# Patient Record
Sex: Male | Born: 1935 | Race: White | Hispanic: No | Smoking: Former smoker
Health system: Southern US, Community
[De-identification: ages and names within clinical notes are randomized; demographics above are authoritative.]

## PROBLEM LIST (undated history)

## (undated) DIAGNOSIS — M75110 Incomplete rotator cuff tear or rupture of unspecified shoulder, not specified as traumatic: Secondary | ICD-10-CM

## (undated) DIAGNOSIS — Z972 Presence of dental prosthetic device (complete) (partial): Secondary | ICD-10-CM

## (undated) DIAGNOSIS — I639 Cerebral infarction, unspecified: Secondary | ICD-10-CM

## (undated) DIAGNOSIS — E785 Hyperlipidemia, unspecified: Secondary | ICD-10-CM

## (undated) DIAGNOSIS — I272 Pulmonary hypertension, unspecified: Secondary | ICD-10-CM

## (undated) DIAGNOSIS — I10 Essential (primary) hypertension: Secondary | ICD-10-CM

## (undated) DIAGNOSIS — D649 Anemia, unspecified: Secondary | ICD-10-CM

## (undated) DIAGNOSIS — I351 Nonrheumatic aortic (valve) insufficiency: Secondary | ICD-10-CM

## (undated) DIAGNOSIS — I739 Peripheral vascular disease, unspecified: Secondary | ICD-10-CM

## (undated) DIAGNOSIS — I2510 Atherosclerotic heart disease of native coronary artery without angina pectoris: Secondary | ICD-10-CM

## (undated) DIAGNOSIS — T8182XA Emphysema (subcutaneous) resulting from a procedure, initial encounter: Secondary | ICD-10-CM

## (undated) DIAGNOSIS — I4819 Other persistent atrial fibrillation: Secondary | ICD-10-CM

## (undated) DIAGNOSIS — I5042 Chronic combined systolic (congestive) and diastolic (congestive) heart failure: Secondary | ICD-10-CM

## (undated) DIAGNOSIS — E119 Type 2 diabetes mellitus without complications: Secondary | ICD-10-CM

## (undated) DIAGNOSIS — I340 Nonrheumatic mitral (valve) insufficiency: Secondary | ICD-10-CM

## (undated) DIAGNOSIS — I6529 Occlusion and stenosis of unspecified carotid artery: Secondary | ICD-10-CM

## (undated) HISTORY — DX: Nonrheumatic mitral (valve) insufficiency: I34.0

## (undated) HISTORY — PX: CORONARY ANGIOPLASTY WITH STENT PLACEMENT: SHX49

## (undated) HISTORY — DX: Emphysema (subcutaneous) resulting from a procedure, initial encounter: T81.82XA

## (undated) HISTORY — DX: Essential (primary) hypertension: I10

## (undated) HISTORY — DX: Atherosclerotic heart disease of native coronary artery without angina pectoris: I25.10

## (undated) HISTORY — PX: KNEE ARTHROSCOPY: SHX127

## (undated) HISTORY — DX: Anemia, unspecified: D64.9

## (undated) HISTORY — DX: Other persistent atrial fibrillation: I48.19

## (undated) HISTORY — PX: MULTIPLE TOOTH EXTRACTIONS: SHX2053

## (undated) HISTORY — PX: FRACTURE SURGERY: SHX138

## (undated) HISTORY — DX: Hyperlipidemia, unspecified: E78.5

## (undated) HISTORY — DX: Nonrheumatic aortic (valve) insufficiency: I35.1

## (undated) HISTORY — DX: Chronic combined systolic (congestive) and diastolic (congestive) heart failure: I50.42

---

## 1982-12-13 HISTORY — PX: HERNIA REPAIR: SHX51

## 1993-10-22 HISTORY — PX: CARDIAC CATHETERIZATION: SHX172

## 1998-08-07 ENCOUNTER — Ambulatory Visit (HOSPITAL_COMMUNITY): Admission: RE | Admit: 1998-08-07 | Discharge: 1998-08-07 | Payer: Self-pay

## 1998-08-19 ENCOUNTER — Ambulatory Visit (HOSPITAL_COMMUNITY): Admission: RE | Admit: 1998-08-19 | Discharge: 1998-08-19 | Payer: Self-pay

## 1999-02-03 ENCOUNTER — Ambulatory Visit (HOSPITAL_COMMUNITY): Admission: RE | Admit: 1999-02-03 | Discharge: 1999-02-03 | Payer: Self-pay | Admitting: Internal Medicine

## 1999-02-03 ENCOUNTER — Encounter: Payer: Self-pay | Admitting: Internal Medicine

## 2000-04-27 ENCOUNTER — Encounter: Payer: Self-pay | Admitting: Internal Medicine

## 2000-04-27 ENCOUNTER — Ambulatory Visit (HOSPITAL_COMMUNITY): Admission: RE | Admit: 2000-04-27 | Discharge: 2000-04-27 | Payer: Self-pay | Admitting: Internal Medicine

## 2000-12-27 ENCOUNTER — Encounter (HOSPITAL_COMMUNITY): Admission: RE | Admit: 2000-12-27 | Discharge: 2001-03-27 | Payer: Self-pay | Admitting: Cardiovascular Disease

## 2004-01-16 ENCOUNTER — Observation Stay (HOSPITAL_COMMUNITY): Admission: EM | Admit: 2004-01-16 | Discharge: 2004-01-17 | Payer: Self-pay | Admitting: Emergency Medicine

## 2008-11-01 ENCOUNTER — Inpatient Hospital Stay (HOSPITAL_BASED_OUTPATIENT_CLINIC_OR_DEPARTMENT_OTHER): Admission: RE | Admit: 2008-11-01 | Discharge: 2008-11-01 | Payer: Self-pay | Admitting: Cardiovascular Disease

## 2008-11-05 ENCOUNTER — Inpatient Hospital Stay (HOSPITAL_COMMUNITY): Admission: RE | Admit: 2008-11-05 | Discharge: 2008-11-06 | Payer: Self-pay | Admitting: Internal Medicine

## 2008-11-05 HISTORY — PX: CARDIAC CATHETERIZATION: SHX172

## 2008-11-21 ENCOUNTER — Encounter (HOSPITAL_COMMUNITY): Admission: RE | Admit: 2008-11-21 | Discharge: 2008-12-11 | Payer: Self-pay | Admitting: Cardiovascular Disease

## 2008-12-13 ENCOUNTER — Encounter (HOSPITAL_COMMUNITY): Admission: RE | Admit: 2008-12-13 | Discharge: 2009-03-11 | Payer: Self-pay | Admitting: Cardiovascular Disease

## 2009-05-09 ENCOUNTER — Encounter
Admission: RE | Admit: 2009-05-09 | Discharge: 2009-05-09 | Payer: Self-pay | Admitting: Physical Medicine and Rehabilitation

## 2010-08-25 ENCOUNTER — Ambulatory Visit: Payer: Self-pay | Admitting: Cardiovascular Disease

## 2011-04-26 ENCOUNTER — Encounter: Payer: Self-pay | Admitting: *Deleted

## 2011-04-26 DIAGNOSIS — E785 Hyperlipidemia, unspecified: Secondary | ICD-10-CM | POA: Insufficient documentation

## 2011-04-26 DIAGNOSIS — I2510 Atherosclerotic heart disease of native coronary artery without angina pectoris: Secondary | ICD-10-CM | POA: Insufficient documentation

## 2011-04-26 DIAGNOSIS — I219 Acute myocardial infarction, unspecified: Secondary | ICD-10-CM | POA: Insufficient documentation

## 2011-04-26 DIAGNOSIS — I10 Essential (primary) hypertension: Secondary | ICD-10-CM | POA: Insufficient documentation

## 2011-04-27 NOTE — Discharge Summary (Signed)
NAME:  Todd Armstrong, Todd Armstrong NO.:  [id number]   MEDICAL RECORD NO.:  [id number]          PATIENT TYPE:  INP   LOCATION:  6529                         FACILITY:  [facility]   PHYSICIAN:  Vesta Mixer, M.D. DATE OF BIRTH:  10/26/1931   DATE OF ADMISSION:  11/05/2008  DATE OF DISCHARGE:  11/06/2008                               DISCHARGE SUMMARY   DISCHARGE DIAGNOSES:  1. Coronary artery disease - status post percutaneous transluminal      coronary angioplasty and stenting of his right coronary artery.  2. 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  3. Dyslipidemia.  4. Hypertension.   DISCHARGE MEDICATIONS:  1. Aspirin 325 mg a day.  2. Plavix 75 mg a day.  3. Crestor 10 mg a day.  4. Nitroglycerin 0.4 mg sublingually as needed.  5. Atenolol 50 mg a day.  6. Hydrochlorothiazide 25 mg a day.   The patient has been instructed to stop his Prilosec as much as  possible.   DISPOSITION:  The patient will see Dr. Elease Hashimoto in the office in 1-2  weeks.   HISTORY:  Mr. [name] is a 77 year old gentleman who recently started  having some episodes of chest discomfort.  He had a diagnostic heart  catheterization which revealed a tight stenosis in his mid right  coronary artery.  His left circumflex artery was occluded.  He underwent  successful PTCA and stenting of his mid right coronary artery.  We  placed a 3.5 x 16 mm Liberte stent.  It was post-dilated using a 4.5 mm  balloon.  We also placed a long 4.0 x 28 mm in the proximal LAD because  of an edge dissection.  This was also post-dilated using a 4.5 mm  balloon.  The final size was about 4.4 mm in size.  The patient  tolerated the procedure quite well.  We were able to obtain a very nice  result.  The patient was discharged on the above-noted medications and  disposition.  All of his other medical problems were stable.      Vesta Mixer, M.D.  Electronically Signed     PJN/MEDQ  D:  11/06/2008  T:   11/06/2008  Job:  [id number]   cc:   Thora Lance, M.D.

## 2011-04-27 NOTE — H&P (Signed)
NAME:  Todd Armstrong, Todd Armstrong NO.:  [id number]   MEDICAL RECORD NO.:  [id number]          PATIENT TYPE:  OUT   LOCATION:                               FACILITY:  [facility]   PHYSICIAN:  Vesta Mixer, M.D. DATE OF BIRTH:  04-09-1931   DATE OF ADMISSION:  10/28/2008  DATE OF DISCHARGE:                              HISTORY & PHYSICAL   Todd Armstrong is an elderly gentleman with a history of coronary artery  disease.  He is status post lateral wall myocardial infarction.  He is  admitted now for cardiac catheterization after presenting with some  chest pains and an abnormal stress Cardiolite study.   Todd Armstrong has a history of coronary artery disease.  He had a lateral  wall myocardial infarction in 2001.  He had an occlusion in his left  circumflex artery.  The angulation of the vessel was so severe that he  is not able to have a stent placed.  He has been treated medically and  is overall done fairly well.  He recently presented to Dr. Jone Baseman  office with some episodes of chest pain and indigestion and heartburn.  Stress Cardiolite study performed this morning revealed a large lateral  wall myocardial infarction with an inferolateral ischemia.  He also went  into atrial fibrillation during the stress test.   He is now admitted for further evaluation.  He denies any syncope or  presyncope.  He denies any PND or orthopnea.  He does have some  shortness of breath and some indigestion, which seems to have gotten a  little worse over the past several years.  He denies any fever, cough,  or sputum production.   CURRENT MEDICATIONS:  1. Tenormin 50 mg a day.  2. Aspirin 325 mg a day.  3. Lipitor 20 mg a day.  4. Hydrochlorothiazide 12.5 mg a day.  5. Prilosec once a day.   He has no known drug allergies.  He is intolerant to some statins.   PAST MEDICAL HISTORY:  1. Coronary artery disease.  2. Hypertension.  3. Hyperlipidemia.   SOCIAL HISTORY:  The patient quit  smoking in 1977.   FAMILY HISTORY:  Positive for cardiac disease.   REVIEW OF SYSTEMS:  He denies any cough or sputum production.  He denies  any rash or skin nodules.  He denies any pleuritic component to his  chest discomfort.  He does not get any regular exercise, but thinks that  his chest pain may be slightly worse with his normal activities.  All  other systems were reviewed and are negative.   PHYSICAL EXAMINATION:  GENERAL:  He is an elderly gentleman in no acute  distress.  He is alert and oriented x3 and his mood and affect are  normal.  VITAL SIGNS:  His weight is 184, blood pressure is 130/70 with heart  rate of 82.  NECK:  2+ carotids.  He has no bruits, no JVD, no thyromegaly.  There is  no lymphadenopathy.  His neck is supple.  LUNGS:  Clear.  HEART:  Regular rate.  S1 and  S2.  There is no chest wall deformity.  ABDOMEN:  Good bowel sounds.  He has no hepatosplenomegaly.  There is no  guarding or rebound.  His abdomen is nontender.  EXTREMITIES:  He has no  clubbing, cyanosis, or edema.  SKIN:  No rash.  He is warm and dry.  NEUROLOGIC:  Motor and sensory function intact.  Gait is normal.  His  mood and affect are normal.   Todd Armstrong presents with episode of chest pain and abnormal stress  Cardiolite study.  He also has developed atrial fibrillation.  We have  scheduled him to be admitted for heart catheterization.  We have  discussed risks, benefits, and options of heart catheterization.  He  understands and agrees to proceed.      Vesta Mixer, M.D.  Electronically Signed     PJN/MEDQ  D:  10/28/2008  T:  10/29/2008  Job:  [id number]   cc:   Thora Lance, M.D.

## 2011-04-27 NOTE — Cardiovascular Report (Signed)
NAME:  Todd Armstrong, Todd Armstrong NO.:  [id number]   MEDICAL RECORD NO.:  [id number]          PATIENT TYPE:  INP   LOCATION:  6529                         FACILITY:  [facility]   PHYSICIAN:  Vesta Mixer, M.D. DATE OF BIRTH:  [birth year]   DATE OF PROCEDURE:  11/05/2008  DATE OF DISCHARGE:                            CARDIAC CATHETERIZATION   Todd Armstrong is a 77 year old gentleman with a history of coronary  artery disease.  He recently had a heart catheterization which revealed  a tight stenosis in a very large and dominant right coronary artery.  He  was brought back today for PCI of that vessel.   The procedure was left heart catheterization with PCI of the right  coronary artery.   The right femoral artery was easily cannulated using a modified  Seldinger technique.   HEMODYNAMICS:  The aortic pressure is 114/50.   ANGIOGRAPHY:  The right coronary artery was engaged using a Judkins  right 4 side-hole guide.   Angiography revealed a large right coronary artery that was somewhat  ectatic.  There was a 40-50% proximal stenosis followed by a tight 80-  90% stenosis.  The distal vessel had minor luminal irregularities.   Angiomax was given.  The ACT was 316.   A Prowater angioplasty wire was threaded down to the distal right  coronary artery.   A 3.0 x 15-mm Apex Monorail was positioned across the stenosis and was  inflated up to 8 atmospheres for 27 seconds and then 14 atmospheres for  29 seconds.  Following this, a 3.5 x 16-mm Liberte stent was positioned  across the stenosis and inflated up to 12 atmospheres for 25 seconds.  Poststent dilatation was achieved using a 4.0 x 12 mm Voyager [place]  inflated up to 12 atmospheres for 16 seconds and then 16 atmospheres for  19 seconds.  This gave [place] fairly good result but was still incomplete  expansion of the middle of the stent.  We then took a 4.5 x 12-mm  Quantum Monorail and placed it at the tightest stenosis and  inflated up  to 6 atmospheres for 30 seconds followed by 10 atmospheres for 22  seconds.  This resulted in significant improvement of the vessel lesion  but it did resolve in an edge dissection in the proximal edge.  Since  there was 50% stenosis from this edge on back to the proximal aspect of  the right coronary artery, we decided to go and stent that whole  segment.  A 4.0 x 28-mm Liberte Monorail stent was positioned across  this entire segment and was deployed at 12 atmospheres for 25 seconds.  Poststent dilatation was achieved using a 4.5 x 12-mm Quantum Monorail.  Inflated distally up to 10 atmospheres for 16 seconds, 10 atmospheres  for 15 seconds, 8 atmospheres for 11 seconds, 6 atmospheres for 27  seconds and 10 atmospheres for 12  seconds as we pulled the balloon from distal to proximal.  This gave [place]  a very nice angiographic result with no evidence of edge dissection.  There was good apposition.  There was a 0% residual.  The patient tolerated the procedure quite well.  He should go home  tomorrow.  No complications.      Vesta Mixer, M.D.  Electronically Signed     PJN/MEDQ  D:  11/05/2008  T:  11/06/2008  Job:  [id number]   cc:   Thora Lance, M.D.

## 2011-04-27 NOTE — Cardiovascular Report (Signed)
NAME:  Todd Armstrong, HENNER NO.:  [id number]   MEDICAL RECORD NO.:  [id number]          PATIENT TYPE:  OIB   LOCATION:  1962                         FACILITY:  [facility]   PHYSICIAN:  Vesta Mixer, M.D. DATE OF BIRTH:  07/31/1931   DATE OF PROCEDURE:  DATE OF DISCHARGE:  11/01/2008                            CARDIAC CATHETERIZATION   Odie Edmonds is a 77 year old gentleman with a history of coronary  artery disease.  He has a known occlusion of the circumflex artery.  He  recently presented with episodes of angina.  A stress Cardiolite study  had revealed a lateral infarct, as well as some inferior ischemia.  He  is referred now for heart catheterization.   The procedure was left heart catheterization with coronary angiography.   The right femoral artery was easily cannulated using modified Seldinger  technique.   HEMODYNAMICS:  LV pressure is 108/11 with an aortic pressure of 109/56.   Angiography:  Left main:  The left main has minor luminal irregularities.   The left anterior descending artery has mild to moderate irregularities  between 10-20%.  The ramus intermediate vessel is a small vessel and has  only minor luminal irregularities.   The left circumflex artery is occluded proximally.  The distal marginal  vessel fills late via left to left collaterals.   The right coronary artery is large and is dominant.  There is a proximal  irregular 40% stenosis, followed by a mid 80-90% stenosis.  This  stenosis is quite eccentric.   The posterior descending artery and the posterolateral segment artery  are normal.  The left ventriculogram reveals the left ventricular  systolic function that is at lower normal with an ejection fraction  around 50%.  There appears to be some lateral akinesis.  The inferior  wall contracts fairly normally.   COMPLICATIONS:  None.   CONCLUSION:  Two-vessel coronary artery disease:  He has a known chronic  total occlusion of  the left circumflex artery.  He has a new tight  stenosis of the right coronary artery.  We will schedule him for PCI of  the right coronary artery.  We will go and start loading him on Plavix.     Vesta Mixer, M.D.  Electronically Signed    PJN/MEDQ  D:  11/01/2008  T:  11/01/2008  Job:  [id number]   cc:   Thora Lance, M.D.

## 2011-04-28 ENCOUNTER — Ambulatory Visit (INDEPENDENT_AMBULATORY_CARE_PROVIDER_SITE_OTHER): Payer: Medicare Other | Admitting: Cardiovascular Disease

## 2011-04-28 ENCOUNTER — Encounter: Payer: Self-pay | Admitting: Cardiovascular Disease

## 2011-04-28 DIAGNOSIS — I2510 Atherosclerotic heart disease of native coronary artery without angina pectoris: Secondary | ICD-10-CM

## 2011-04-28 DIAGNOSIS — E785 Hyperlipidemia, unspecified: Secondary | ICD-10-CM

## 2011-04-28 NOTE — Progress Notes (Signed)
Todd Armstrong Date of Birth  1931-01-17 [facility] / [facility] [address]     [address] [place], [place]  [address] [phone]  Fax  [phone]  History of Present Illness:  Todd Armstrong is doing well from a cardiac standpoint. He has not had any episodes of chest pain or shortness breath. [name] been exercising on a regular basis.  Current Outpatient Prescriptions on File Prior to Visit  Medication Sig Dispense Refill  . aspirin 325 MG tablet Take 325 mg by mouth daily.        . hydrochlorothiazide (,MICROZIDE/HYDRODIURIL,) 12.5 MG capsule Take 12.5 mg by mouth daily.        . metFORMIN (GLUCOPHAGE) 1000 MG tablet Take 1,000 mg by mouth 2 (two) times daily with a meal.        . nitroGLYCERIN (NITROSTAT) 0.4 MG SL tablet Place 0.4 mg under the tongue every 5 (five) minutes as needed.        . rosuvastatin (CRESTOR) 10 MG tablet Take 10 mg by mouth daily.          No Known Allergies  Past Medical History  Diagnosis Date  . Acute myocardial infarction   . Dyslipidemia   . Hyperlipemia   . Hypertension   . Coronary artery disease     PTCA and stenting of his right coronary artery. 3.5 x 16-mm Liberte stent.                Past Surgical History  Procedure Date  . Hernia repair 1984  . Cardiac catheterization 11/05/2008  . Cardiac catheterization 10/22/1993    EF 60%    History  Smoking status  . Former Smoker  Smokeless tobacco  . Not on file    History  Alcohol Use No    Family History  Problem Relation Age of Onset  . Heart attack Father     Reviw of Systems:  Reviewed in the HPI.  All other systems are negative.  Physical Exam: BP 132/72  Pulse 58  Ht 5\' 10"  (1.778 m)  Wt 176 lb (79.833 kg)  BMI 25.25 kg/m2 The patient is alert and oriented x 3.  The mood and affect are normal.  The skin is warm and dry.  Color is normal.  The HEENT exam reveals that the sclera are nonicteric.  The mucous membranes are moist.  The  carotids are 2+ without bruits.  There is no thyromegaly.  There is no JVD.  The lungs are clear.  The chest wall is non tender.  The heart exam reveals a regular rate with a normal S1 and S2.  There are no murmurs, gallops, or rubs.  The PMI is not displaced.   Abdominal exam reveals good bowel sounds.  There is no guarding or rebound.  There is no hepatosplenomegaly or tenderness.  There are no masses.  Exam of the legs reveal no clubbing, cyanosis, or edema.  The legs are without rashes.  The distal pulses are intact.  Cranial nerves II - XII are intact.  Motor and sensory functions are intact.  The gait is normal.  Assessment / Plan:

## 2011-04-28 NOTE — Assessment & Plan Note (Signed)
Todd Armstrong is very stable. He has not had any angina. I would like to continue with the same medications.

## 2011-04-30 NOTE — H&P (Signed)
NAME:  Todd Armstrong, Todd Armstrong NO.:  [id number]   MEDICAL RECORD NO.:  [id number]                   PATIENT TYPE:  INP   LOCATION:  1826                                 FACILITY:  [facility]   PHYSICIAN:  Lianne Cure, P.A.               DATE OF BIRTH:  1931-06-10   DATE OF ADMISSION:  01/16/2004  DATE OF DISCHARGE:                                HISTORY & PHYSICAL   HISTORY OF PRESENT ILLNESS:  This 77 year old male fell outside his home,  reportedly slipped on ice.  Had immediate right lower leg pain in the shin  area.  He reports no numbness or tingling.  No other musculoskeletal  complaints.   PAST MEDICAL HISTORY:  1. Hypertension.  2. Hypercholesterolemia.  3. History of an MI approximately two years ago.   PAST SURGICAL HISTORY:  Left knee debridement.   No known drug allergies.   MEDICATIONS:  1. Atenolol 5 mg p.o. q.d.  2. Lipitor 10 mg p.o. q.d.  3. Enteric-coated aspirin 325 mg daily.   SOCIAL HISTORY:  He is a nonsmoker.  He does occasionally drink beer once in  a while.   REVIEW OF SYSTEMS:  No migraines or seizures.  No shortness of breath.  No  angina.  No difficulty swallowing.  No chronic cough.  No hemoptysis.  No  bowel or bladder dysfunction.  No melena.   PHYSICAL EXAMINATION:  EXTREMITIES:  Right lower extremity does have  positive edema.  Positive ecchymosis.  His neurovascular and motor are  intact and equal bilaterally and distally.  Palpable DP/PT pulses are equal  bilaterally.  Sensation to light touch is intact and equal bilaterally.  Range of motion of the ankles and toes is equal bilaterally.  He is  nontender to palpation in bilateral femurs, hips.  Bilateral upper  extremities are nontender to palpation at the joints.  He is neurovascularly  motor intact and equal bilaterally in the upper extremities.   X-rays reveal a distal one-third tibial fracture, moderately displaced with  shortening on oblique fracture pattern.   Proximal fibular head is also  fractured.  Minimally displaced.   IMPRESSION:  Right distal tibial fracture.   PLAN:  We are going to take him to the OR this evening at approximately 7  p.m. by Dr. Leonides Grills, who will insert an intramedullary nail.  He is  going to remain n.p.o.  We gave him morphine __________ q.3h. for pain  control.  The last time he ate was at approximately 12:00 today.                                                Lianne Cure, P.A.    MC/MEDQ  D:  01/16/2004  T:  01/16/2004  Job:  [id number]

## 2011-04-30 NOTE — Op Note (Signed)
NAME:  Todd Armstrong, Todd Armstrong NO.:  [id number]   MEDICAL RECORD NO.:  [id number]                   PATIENT TYPE:  INP   LOCATION:  1826                                 FACILITY:  [facility]   PHYSICIAN:  Leonides Grills, M.D.                  DATE OF BIRTH:  05-Aug-1931   DATE OF PROCEDURE:  01/16/2004  DATE OF DISCHARGE:                                 OPERATIVE REPORT   PREOPERATIVE DIAGNOSIS:  Right closed tib/fib fracture.   POSTOPERATIVE DIAGNOSIS:  Right closed tib/fib fracture.   OPERATION:  1. Intramedullary nailing right closed tib/fib fracture.  2. Stress x-rays right leg.   ANESTHESIA:  General endotracheal anesthesia.   SURGEON:  Leonides Grills, M.D.   ASSISTANT:  Lianne Cure, P.A.-C.   ESTIMATED BLOOD LOSS:  Minimal.   COMPLICATIONS:  None.   DISPOSITION:  Stable to the PACU.   INDICATIONS FOR PROCEDURE:  This is a 77 year old male who slipped and fell  today on the ice and sustained the above injury.  He was taken to [facility]  ER where x-rays were obtained and I was called for further evaluation and  treatment.  He was consented for the above procedure.  All risks which  include infection, neurovascular injury, malunion, nonunion, hardware  failure, compartment syndrome, stiffness, hardware rotation, were all  explained, questions were encouraged and answered.   OPERATION:  The patient was brought to the operating room and placed in  supine position.  After adequate general endotracheal tube anesthesia was  administered as well as Ancef 1 gram IV piggyback, the right lower extremity  was then prepped and draped in a sterile manner.  A longitudinal incision  just medial to the patellar tendon was then made.  Dissection was carried  down through the skin.  The retinaculum was incised in line with the  incision just medial to the patellar tendon.  The anterior crest of the  tibia was then identified.  A K-wire was then applied in the AP and  lateral  planes and was noted to be in proper position.  This was then fired and the  outer cortex was drilled.  An awl was placed followed by a guide-wire.  This  was all done under C-arm guidance in the AP and lateral planes.  The guide-  wire was advanced to the fracture site, the fracture site was anatomically  reduced.  The guide-wire was passed across the fracture site and advanced to  the growth plate.  This was verified in AP and lateral planes under C-arm  guidance.  We then commenced reaming starting with a #9 reamer and advanced  all the way to a #12.  We then chose an 11 mm wide 160 mm long Synthes  titanium nail, the length was also measured by using the same guide-wire.  The nail was then advanced.  This was then proximally locked with an  outrigger  through stab wounds with the nick and spread technique.  Both  screws had excellent purchase.  Distally, we then compressed the fracture  site and then with free arm technique, we placed two distally locked screws  through medial incisions with the nick and spread technique.  Both screws  were in excellent alignment and excellent purchase.  Final x-rays were  obtained in the AP and lateral planes and showed anatomic reduction with the  proper placement of the nails.  The wounds were copiously irrigated with  normal saline.  The retinaculum was closed with 2-0 Vicryl, the subcu was  closed with 2-0 Vicryl, the skin was closed over all wounds with staples.  A  sterile dressing was applied, a modified Jones dressing was applied with the  ankle in neutral dorsiflexion.  The patient was stable to the recovery room.                                               Leonides Grills, M.D.    PB/MEDQ  D:  01/16/2004  T:  01/17/2004  Job:  [id number]

## 2011-05-04 ENCOUNTER — Telehealth: Payer: Self-pay | Admitting: Cardiovascular Disease

## 2011-05-04 NOTE — Telephone Encounter (Signed)
SAMPLES OF CRESTOR.

## 2011-05-26 ENCOUNTER — Telehealth: Payer: Self-pay | Admitting: Cardiovascular Disease

## 2011-05-26 NOTE — Telephone Encounter (Signed)
PT ASKING FOR CRESTOR SAMPLES, CALL AND LET HIM KNOW.

## 2011-05-26 NOTE — Telephone Encounter (Signed)
We have samples, left msg to pick up, we dont have coumadin samples.

## 2011-07-05 ENCOUNTER — Telehealth: Payer: Self-pay | Admitting: Cardiovascular Disease

## 2011-07-05 NOTE — Telephone Encounter (Signed)
Would like to pick up some 10 mg samples of Crestor as soon as possible. Please call back

## 2011-08-28 ENCOUNTER — Other Ambulatory Visit: Payer: Self-pay | Admitting: Cardiovascular Disease

## 2011-09-14 LAB — BASIC METABOLIC PANEL
CO2: 25
Calcium: 8.6
GFR calc Af Amer: >60
GFR calc non Af Amer: >60
Glucose, Bld: 132 — ABNORMAL HIGH
Potassium: 3.8
Sodium: 135

## 2011-09-14 LAB — CBC
HCT: 44.5
Hemoglobin: 15.1
MCHC: 34
RBC: 4.67
RDW: 12.9

## 2011-09-17 LAB — GLUCOSE, CAPILLARY
Glucose-Capillary: 115 mg/dL — ABNORMAL HIGH (ref 70–99)
Glucose-Capillary: 173 mg/dL — ABNORMAL HIGH (ref 70–99)
Glucose-Capillary: 174 mg/dL — ABNORMAL HIGH (ref 70–99)
Glucose-Capillary: 174 mg/dL — ABNORMAL HIGH (ref 70–99)

## 2011-10-26 ENCOUNTER — Telehealth: Payer: Self-pay | Admitting: *Deleted

## 2011-10-26 NOTE — Telephone Encounter (Signed)
Called msg we have crestor samples available one month dispensed. [id number] 06/15

## 2011-11-16 ENCOUNTER — Telehealth: Payer: Self-pay | Admitting: Cardiovascular Disease

## 2011-11-16 NOTE — Telephone Encounter (Signed)
Pt calling  requesting samples of crestor 10 mg

## 2011-11-16 NOTE — Telephone Encounter (Signed)
Pt called and informed him that samples were available

## 2011-12-08 ENCOUNTER — Telehealth: Payer: Self-pay | Admitting: Cardiovascular Disease

## 2011-12-08 NOTE — Telephone Encounter (Signed)
New Msg: Pt calling wanting to know if pt can get 10 mg samples of crestor. Please return pt call to discusd further.

## 2011-12-08 NOTE — Telephone Encounter (Signed)
Samples provided and pt informed

## 2012-02-14 ENCOUNTER — Other Ambulatory Visit: Payer: Self-pay | Admitting: Cardiovascular Disease

## 2012-02-15 ENCOUNTER — Other Ambulatory Visit: Payer: Self-pay

## 2012-02-15 MED ORDER — ROSUVASTATIN CALCIUM 10 MG PO TABS: 10.0000 mg | ORAL_TABLET | Freq: Every day | ORAL | Status: DC

## 2012-02-29 DIAGNOSIS — L570 Actinic keratosis: Secondary | ICD-10-CM | POA: Diagnosis not present

## 2012-02-29 DIAGNOSIS — C44621 Squamous cell carcinoma of skin of unspecified upper limb, including shoulder: Secondary | ICD-10-CM | POA: Diagnosis not present

## 2012-02-29 DIAGNOSIS — L821 Other seborrheic keratosis: Secondary | ICD-10-CM | POA: Diagnosis not present

## 2012-03-07 DIAGNOSIS — E1149 Type 2 diabetes mellitus with other diabetic neurological complication: Secondary | ICD-10-CM | POA: Diagnosis not present

## 2012-03-07 DIAGNOSIS — L608 Other nail disorders: Secondary | ICD-10-CM | POA: Diagnosis not present

## 2012-03-13 DIAGNOSIS — E119 Type 2 diabetes mellitus without complications: Secondary | ICD-10-CM | POA: Diagnosis not present

## 2012-03-13 DIAGNOSIS — Z0000 Encounter for general adult medical examination without abnormal findings: Secondary | ICD-10-CM | POA: Diagnosis not present

## 2012-03-13 DIAGNOSIS — E785 Hyperlipidemia, unspecified: Secondary | ICD-10-CM | POA: Diagnosis not present

## 2012-03-13 DIAGNOSIS — I10 Essential (primary) hypertension: Secondary | ICD-10-CM | POA: Diagnosis not present

## 2012-03-13 DIAGNOSIS — Z1331 Encounter for screening for depression: Secondary | ICD-10-CM | POA: Diagnosis not present

## 2012-06-01 DIAGNOSIS — D239 Other benign neoplasm of skin, unspecified: Secondary | ICD-10-CM | POA: Diagnosis not present

## 2012-06-01 DIAGNOSIS — Z85828 Personal history of other malignant neoplasm of skin: Secondary | ICD-10-CM | POA: Diagnosis not present

## 2012-06-01 DIAGNOSIS — L570 Actinic keratosis: Secondary | ICD-10-CM | POA: Diagnosis not present

## 2012-06-01 DIAGNOSIS — L821 Other seborrheic keratosis: Secondary | ICD-10-CM | POA: Diagnosis not present

## 2012-06-06 ENCOUNTER — Telehealth: Payer: Self-pay | Admitting: Cardiovascular Disease

## 2012-06-06 NOTE — Telephone Encounter (Signed)
Please return call to patient at [phone] regarding medical care.

## 2012-06-06 NOTE — Telephone Encounter (Signed)
Pt needed samples of crestor for him and wife, carolyn, provided

## 2012-06-20 ENCOUNTER — Other Ambulatory Visit: Payer: Self-pay | Admitting: *Deleted

## 2012-06-20 MED ORDER — ATENOLOL 50 MG PO TABS: 50.0000 mg | ORAL_TABLET | Freq: Every day | ORAL | Status: DC

## 2012-06-20 NOTE — Telephone Encounter (Signed)
Pt needs appointment then refill can be made Fax Received. Refill Completed. Keshun Berrett Chowoe (R.M.A)   

## 2012-06-23 DIAGNOSIS — L608 Other nail disorders: Secondary | ICD-10-CM | POA: Diagnosis not present

## 2012-06-27 DIAGNOSIS — Z125 Encounter for screening for malignant neoplasm of prostate: Secondary | ICD-10-CM | POA: Diagnosis not present

## 2012-06-27 DIAGNOSIS — R351 Nocturia: Secondary | ICD-10-CM | POA: Diagnosis not present

## 2012-07-12 ENCOUNTER — Other Ambulatory Visit: Payer: Self-pay | Admitting: Cardiovascular Disease

## 2012-07-12 NOTE — Telephone Encounter (Signed)
Pt needs appointment then refill can be made Fax Received. Refill Completed. Lilliane Sposito Chowoe (R.M.A)   

## 2012-07-14 DIAGNOSIS — E119 Type 2 diabetes mellitus without complications: Secondary | ICD-10-CM | POA: Diagnosis not present

## 2012-07-14 DIAGNOSIS — I10 Essential (primary) hypertension: Secondary | ICD-10-CM | POA: Diagnosis not present

## 2012-08-17 ENCOUNTER — Other Ambulatory Visit: Payer: Self-pay | Admitting: *Deleted

## 2012-08-17 ENCOUNTER — Other Ambulatory Visit: Payer: Self-pay

## 2012-08-17 MED ORDER — ATENOLOL 50 MG PO TABS: 50.0000 mg | ORAL_TABLET | Freq: Every day | ORAL | Status: DC

## 2012-08-17 NOTE — Telephone Encounter (Signed)
Fax Received. Refill Completed. Todd Armstrong (R.M.A)   

## 2012-10-17 ENCOUNTER — Ambulatory Visit: Payer: PRIVATE HEALTH INSURANCE | Admitting: Cardiovascular Disease

## 2012-10-17 DIAGNOSIS — Z23 Encounter for immunization: Secondary | ICD-10-CM | POA: Diagnosis not present

## 2012-10-19 ENCOUNTER — Ambulatory Visit (INDEPENDENT_AMBULATORY_CARE_PROVIDER_SITE_OTHER): Payer: Medicare Other | Admitting: Cardiovascular Disease

## 2012-10-19 ENCOUNTER — Encounter: Payer: Self-pay | Admitting: Cardiovascular Disease

## 2012-10-19 VITALS — BP 105/60 | HR 54 | Ht 70.0 in | Wt 168.6 lb

## 2012-10-19 DIAGNOSIS — I2510 Atherosclerotic heart disease of native coronary artery without angina pectoris: Secondary | ICD-10-CM | POA: Diagnosis not present

## 2012-10-19 DIAGNOSIS — E785 Hyperlipidemia, unspecified: Secondary | ICD-10-CM | POA: Diagnosis not present

## 2012-10-19 NOTE — Assessment & Plan Note (Addendum)
Desman has not had any angina.  Continue current meds. Will see him in 6 months with ov and fasting labs.

## 2012-10-19 NOTE — Progress Notes (Signed)
Todd Armstrong Date of Birth  12-26-1930 [facility] [address]     [address] [place], [place]  [address] [phone]  Fax  [phone]   Problems: 1. CAD, s/p LAD stent 2009 2. HTN 3. Diabetes Mellitus 4. Hyperlipidemia   History of Present Illness:  Todd Armstrong is doing well from a cardiac standpoint. He has not had any episodes of chest pain or shortness breath. [name] been exercising on a regular basis.  He had a nice garden this year.    Current Outpatient Prescriptions on File Prior to Visit  Medication Sig Dispense Refill  . aspirin 325 MG tablet Take 325 mg by mouth daily.        [name] atenolol (TENORMIN) 50 MG tablet Take 1 tablet (50 mg total) by mouth daily.  90 tablet  0  . hydrochlorothiazide (,MICROZIDE/HYDRODIURIL,) 12.5 MG capsule Take 12.5 mg by mouth daily.        . metFORMIN (GLUCOPHAGE) 1000 MG tablet Take 1,000 mg by mouth 2 (two) times daily with a meal.        . nitroGLYCERIN (NITROSTAT) 0.4 MG SL tablet Place 0.4 mg under the tongue every 5 (five) minutes as needed.        . rosuvastatin (CRESTOR) 10 MG tablet Take 1 tablet (10 mg total) by mouth daily.  30 tablet  2    No Known Allergies  Past Medical History  Diagnosis Date  . Acute myocardial infarction   . Dyslipidemia   . Hyperlipemia   . Hypertension   . Coronary artery disease     PTCA and stenting of his right coronary artery. 3.5 x 16-mm Liberte stent.                Past Surgical History  Procedure Date  . Hernia repair 1984  . Cardiac catheterization 11/05/2008  . Cardiac catheterization 10/22/1993    EF 60%    History  Smoking status  . Former Smoker  Smokeless tobacco  . Not on file    History  Alcohol Use No    Family History  Problem Relation Age of Onset  . Heart attack Father     Reviw of Systems:  Reviewed in the HPI.  All other systems are negative.  Physical Exam: BP 178/72  Pulse 54  Ht 5\' 10"  (1.778 m)  Wt 168  lb 9.6 oz (76.476 kg)  BMI 24.19 kg/m2 The patient is alert and oriented x 3.  The mood and affect are normal.  The skin is warm and dry.  Color is normal.  The HEENT exam reveals that the sclera are nonicteric.  The mucous membranes are moist.  The carotids are 2+ without bruits.  There is no thyromegaly.  There is no JVD.  The lungs are clear.  The chest wall is non tender.  The heart exam reveals a regular rate with a normal S1 and S2.  There are no murmurs, gallops, or rubs.  The PMI is not displaced.   Abdominal exam reveals good bowel sounds.  There is no guarding or rebound.  There is no hepatosplenomegaly or tenderness.  There are no masses.  Exam of the legs reveal no clubbing, cyanosis, or edema.  The legs are without rashes.  The distal pulses are intact.  Cranial nerves II - XII are intact.  Motor and sensory functions are intact.  The gait is normal.  ECG:  Sinus brady at 58, septal MI.  Assessment /  Plan:

## 2012-10-19 NOTE — Patient Instructions (Addendum)
Your physician wants you to follow-up in: 6 months You will receive a reminder letter in the mail two months in advance. If you don't receive a letter, please call our office to schedule the follow-up appointment.  Your physician recommends that you return for a FASTING lipid profile: 6 months   Your physician recommends that you continue on your current medications as directed. Please refer to the Current Medication list given to you today.  

## 2012-10-27 ENCOUNTER — Encounter: Payer: Self-pay | Admitting: Cardiovascular Disease

## 2012-10-30 ENCOUNTER — Encounter: Payer: Self-pay | Admitting: Cardiovascular Disease

## 2012-11-08 ENCOUNTER — Other Ambulatory Visit: Payer: Self-pay | Admitting: Cardiovascular Disease

## 2012-11-08 MED ORDER — ATENOLOL 50 MG PO TABS: 50.0000 mg | ORAL_TABLET | Freq: Every day | ORAL | Status: DC

## 2012-12-01 DIAGNOSIS — L819 Disorder of pigmentation, unspecified: Secondary | ICD-10-CM | POA: Diagnosis not present

## 2012-12-01 DIAGNOSIS — L570 Actinic keratosis: Secondary | ICD-10-CM | POA: Diagnosis not present

## 2012-12-01 DIAGNOSIS — L821 Other seborrheic keratosis: Secondary | ICD-10-CM | POA: Diagnosis not present

## 2012-12-01 DIAGNOSIS — L738 Other specified follicular disorders: Secondary | ICD-10-CM | POA: Diagnosis not present

## 2012-12-01 DIAGNOSIS — Z85828 Personal history of other malignant neoplasm of skin: Secondary | ICD-10-CM | POA: Diagnosis not present

## 2013-01-30 DIAGNOSIS — E119 Type 2 diabetes mellitus without complications: Secondary | ICD-10-CM | POA: Diagnosis not present

## 2013-01-30 DIAGNOSIS — I10 Essential (primary) hypertension: Secondary | ICD-10-CM | POA: Diagnosis not present

## 2013-01-30 DIAGNOSIS — E785 Hyperlipidemia, unspecified: Secondary | ICD-10-CM | POA: Diagnosis not present

## 2013-04-26 ENCOUNTER — Encounter: Payer: Self-pay | Admitting: Cardiovascular Disease

## 2013-04-26 ENCOUNTER — Ambulatory Visit (INDEPENDENT_AMBULATORY_CARE_PROVIDER_SITE_OTHER): Payer: Medicare Other | Admitting: Cardiovascular Disease

## 2013-04-26 VITALS — BP 114/64 | HR 60 | Ht 70.0 in | Wt 172.4 lb

## 2013-04-26 DIAGNOSIS — I2510 Atherosclerotic heart disease of native coronary artery without angina pectoris: Secondary | ICD-10-CM

## 2013-04-26 NOTE — Patient Instructions (Addendum)
Your physician wants you to follow-up in: 6 months  You will receive a reminder letter in the mail two months in advance. If you don't receive a letter, please call our office to schedule the follow-up appointment.   Your physician recommends that you return for a FASTING lipid profile: 6 mon

## 2013-04-26 NOTE — Assessment & Plan Note (Signed)
Todd Armstrong is doing well. [name] not having any episodes of chest pain or shortness breath. I seen again in 6 months. We'll check fasting lipid profile, liver enzymes, and basic metabolic profile at that office visit.

## 2013-04-26 NOTE — Progress Notes (Signed)
Todd Armstrong Date of Birth  1931-06-17 [facility] [address]     [address] [place], [place]  [address] [phone]  Fax  [phone]   Problems: 1. CAD, s/p LAD stent 2009 2. HTN 3. Diabetes Mellitus 4. Hyperlipidemia   History of Present Illness:  Todd Armstrong is doing well from a cardiac standpoint. He has not had any episodes of chest pain or shortness breath. [name] been exercising on a regular basis.  He had a nice garden this year.    Apr 26, 2013:  Todd Armstrong is doing well.  No CP , no dyspnea.   He has put in his garden this year.    Current Outpatient Prescriptions on File Prior to Visit  Medication Sig Dispense Refill  . aspirin 325 MG tablet Take 325 mg by mouth daily.        [name] atenolol (TENORMIN) 50 MG tablet Take 1 tablet (50 mg total) by mouth daily.  90 tablet  1  . hydrochlorothiazide (,MICROZIDE/HYDRODIURIL,) 12.5 MG capsule Take 12.5 mg by mouth daily.        . metFORMIN (GLUCOPHAGE) 1000 MG tablet Take 1,000 mg by mouth 2 (two) times daily with a meal.        . nitroGLYCERIN (NITROSTAT) 0.4 MG SL tablet Place 0.4 mg under the tongue every 5 (five) minutes as needed.        . rosuvastatin (CRESTOR) 10 MG tablet Take 1 tablet (10 mg total) by mouth daily.  30 tablet  2   No current facility-administered medications on file prior to visit.    No Known Allergies  Past Medical History  Diagnosis Date  . Acute myocardial infarction   . Dyslipidemia   . Hyperlipemia   . Hypertension   . Coronary artery disease     PTCA and stenting of his right coronary artery. 3.5 x 16-mm Liberte stent.                Past Surgical History  Procedure Laterality Date  . Hernia repair  1984  . Cardiac catheterization  11/05/2008  . Cardiac catheterization  10/22/1993    EF 60%    History  Smoking status  . Former Smoker  Smokeless tobacco  . Not on file    History  Alcohol Use No    Family History  Problem Relation  Age of Onset  . Heart attack Father     Reviw of Systems:  Reviewed in the HPI.  All other systems are negative.  Physical Exam: BP 114/64  Pulse 60  Ht 5\' 10"  (1.778 m)  Wt 172 lb 6.4 oz (78.2 kg)  BMI 24.74 kg/m2 The patient is alert and oriented x 3.  The mood and affect are normal.  The skin is warm and dry.  Color is normal.  The HEENT exam reveals that the sclera are nonicteric.  The mucous membranes are moist.  The carotids are 2+ without bruits.  There is no thyromegaly.  There is no JVD.  The lungs are clear.  The chest wall is non tender.  The heart exam reveals a regular rate with a normal S1 and S2.  There are no murmurs, gallops, or rubs.  The PMI is not displaced.   Abdominal exam reveals good bowel sounds.  There is no guarding or rebound.  There is no hepatosplenomegaly or tenderness.  There are no masses.  Exam of the legs reveal no clubbing, cyanosis, or edema.  The legs are without rashes.  The distal pulses are intact.  Cranial nerves II - XII are intact.  Motor and sensory functions are intact.  The gait is normal.  ECG:  Sinus brady at 60, septal MI.  Assessment / Plan:

## 2013-06-01 DIAGNOSIS — L570 Actinic keratosis: Secondary | ICD-10-CM | POA: Diagnosis not present

## 2013-06-01 DIAGNOSIS — D045 Carcinoma in situ of skin of trunk: Secondary | ICD-10-CM | POA: Diagnosis not present

## 2013-06-01 DIAGNOSIS — Z85828 Personal history of other malignant neoplasm of skin: Secondary | ICD-10-CM | POA: Diagnosis not present

## 2013-06-01 DIAGNOSIS — D485 Neoplasm of uncertain behavior of skin: Secondary | ICD-10-CM | POA: Diagnosis not present

## 2013-06-01 DIAGNOSIS — L821 Other seborrheic keratosis: Secondary | ICD-10-CM | POA: Diagnosis not present

## 2013-06-01 DIAGNOSIS — L259 Unspecified contact dermatitis, unspecified cause: Secondary | ICD-10-CM | POA: Diagnosis not present

## 2013-06-01 DIAGNOSIS — D044 Carcinoma in situ of skin of scalp and neck: Secondary | ICD-10-CM | POA: Diagnosis not present

## 2013-06-20 ENCOUNTER — Other Ambulatory Visit: Payer: Self-pay | Admitting: Cardiovascular Disease

## 2013-06-20 NOTE — Telephone Encounter (Signed)
Fax Received. Refill Completed. Todd Armstrong (R.M.A)   

## 2013-07-17 DIAGNOSIS — L608 Other nail disorders: Secondary | ICD-10-CM | POA: Diagnosis not present

## 2013-07-17 DIAGNOSIS — E119 Type 2 diabetes mellitus without complications: Secondary | ICD-10-CM | POA: Diagnosis not present

## 2013-07-24 ENCOUNTER — Other Ambulatory Visit: Payer: PRIVATE HEALTH INSURANCE

## 2013-07-31 DIAGNOSIS — I10 Essential (primary) hypertension: Secondary | ICD-10-CM | POA: Diagnosis not present

## 2013-07-31 DIAGNOSIS — E119 Type 2 diabetes mellitus without complications: Secondary | ICD-10-CM | POA: Diagnosis not present

## 2013-08-17 DIAGNOSIS — H2510 Age-related nuclear cataract, unspecified eye: Secondary | ICD-10-CM | POA: Diagnosis not present

## 2013-08-17 DIAGNOSIS — H04129 Dry eye syndrome of unspecified lacrimal gland: Secondary | ICD-10-CM | POA: Diagnosis not present

## 2013-08-17 DIAGNOSIS — H1045 Other chronic allergic conjunctivitis: Secondary | ICD-10-CM | POA: Diagnosis not present

## 2013-09-10 DIAGNOSIS — Z23 Encounter for immunization: Secondary | ICD-10-CM | POA: Diagnosis not present

## 2013-10-23 DIAGNOSIS — L84 Corns and callosities: Secondary | ICD-10-CM | POA: Diagnosis not present

## 2013-10-23 DIAGNOSIS — L608 Other nail disorders: Secondary | ICD-10-CM | POA: Diagnosis not present

## 2013-10-23 DIAGNOSIS — E119 Type 2 diabetes mellitus without complications: Secondary | ICD-10-CM | POA: Diagnosis not present

## 2013-10-24 ENCOUNTER — Encounter: Payer: Self-pay | Admitting: Cardiovascular Disease

## 2013-10-24 ENCOUNTER — Other Ambulatory Visit: Payer: Medicare Other

## 2013-10-24 ENCOUNTER — Ambulatory Visit (INDEPENDENT_AMBULATORY_CARE_PROVIDER_SITE_OTHER): Payer: Medicare Other | Admitting: Cardiovascular Disease

## 2013-10-24 VITALS — BP 145/70 | HR 64 | Ht 70.0 in | Wt 170.0 lb

## 2013-10-24 DIAGNOSIS — E785 Hyperlipidemia, unspecified: Secondary | ICD-10-CM

## 2013-10-24 DIAGNOSIS — I10 Essential (primary) hypertension: Secondary | ICD-10-CM | POA: Diagnosis not present

## 2013-10-24 DIAGNOSIS — I2510 Atherosclerotic heart disease of native coronary artery without angina pectoris: Secondary | ICD-10-CM

## 2013-10-24 LAB — BASIC METABOLIC PANEL
CO2: 28 mEq/L (ref 19–32)
Calcium: 9.3 mg/dL (ref 8.4–10.5)
Creatinine, Ser: 1 mg/dL (ref 0.4–1.5)
GFR: 73.74 mL/min (ref >60.00)
Sodium: 136 mEq/L (ref 135–145)

## 2013-10-24 LAB — LIPID PANEL
Cholesterol: 153 mg/dL (ref 0–200)
HDL: 59.5 mg/dL (ref >39.00)
Triglycerides: 157 mg/dL — ABNORMAL HIGH (ref 0.0–149.0)

## 2013-10-24 LAB — HEPATIC FUNCTION PANEL
ALT: 24 U/L (ref 0–53)
AST: 24 U/L (ref 0–37)
Albumin: 4.3 g/dL (ref 3.5–5.2)
Alkaline Phosphatase: 48 U/L (ref 39–117)
Total Protein: 7.1 g/dL (ref 6.0–8.3)

## 2013-10-24 MED ORDER — ATENOLOL 50 MG PO TABS: ORAL_TABLET | ORAL | Status: DC

## 2013-10-24 NOTE — Assessment & Plan Note (Signed)
Will check  Lipids today.

## 2013-10-24 NOTE — Progress Notes (Signed)
[name] Date of Birth  10/19/1931 [facility] [address]     [address] [place], [place]  [address] [phone]  Fax  [phone]   Problems: 1. CAD, s/p LAD stent 2009 2. HTN 3. Diabetes Mellitus 4. Hyperlipidemia   History of Present Illness:  Daiton is doing well from a cardiac standpoint. He has not had any episodes of chest pain or shortness breath. [name] been exercising on a regular basis.  He had a nice garden this year.    Apr 26, 2013:  Shalamar is doing well.  No CP , no dyspnea.   He has put in his garden this year.    Nov. 12, 2014:    Current Outpatient Prescriptions on File Prior to Visit  Medication Sig Dispense Refill  . aspirin 325 MG tablet Take 325 mg by mouth daily.        [name] atenolol (TENORMIN) 50 MG tablet TAKE ONE TABLET BY MOUTH EVERY DAY  30 tablet  6  . hydrochlorothiazide (,MICROZIDE/HYDRODIURIL,) 12.5 MG capsule Take 12.5 mg by mouth daily.        . metFORMIN (GLUCOPHAGE) 1000 MG tablet Take 1,000 mg by mouth 2 (two) times daily with a meal.        . nitroGLYCERIN (NITROSTAT) 0.4 MG SL tablet Place 0.4 mg under the tongue every 5 (five) minutes as needed.        . rosuvastatin (CRESTOR) 10 MG tablet Take 1 tablet (10 mg total) by mouth daily.  30 tablet  2   No current facility-administered medications on file prior to visit.    No Known Allergies  Past Medical History  Diagnosis Date  . Acute myocardial infarction   . Dyslipidemia   . Hyperlipemia   . Hypertension   . Coronary artery disease     PTCA and stenting of his right coronary artery. 3.5 x 16-mm Liberte stent.                Past Surgical History  Procedure Laterality Date  . Hernia repair  1984  . Cardiac catheterization  11/05/2008  . Cardiac catheterization  10/22/1993    EF 60%    History  Smoking status  . Former Smoker  Smokeless tobacco  . Not on file    History  Alcohol Use No    Family History   Problem Relation Age of Onset  . Heart attack Father     Reviw of Systems:  Reviewed in the HPI.  All other systems are negative.  Physical Exam: BP 145/70  Pulse 64  Ht 5\' 10"  (1.778 m)  Wt 170 lb (77.111 kg)  BMI 24.39 kg/m2 The patient is alert and oriented x 3.  The mood and affect are normal.  The skin is warm and dry.  Color is normal.  The HEENT exam reveals that the sclera are nonicteric.  The mucous membranes are moist.  The carotids are 2+ without bruits.  There is no thyromegaly.  There is no JVD.  The lungs are clear.  The chest wall is non tender.  The heart exam reveals a regular rate with a normal S1 and S2.  There are no murmurs, gallops, or rubs.  The PMI is not displaced.   Abdominal exam reveals good bowel sounds.  There is no guarding or rebound.  There is no hepatosplenomegaly or tenderness.  There are no masses.  Exam of the legs reveal no clubbing, cyanosis, or  edema.  The legs are without rashes.  The distal pulses are intact.  Cranial nerves II - XII are intact.  Motor and sensory functions are intact.  The gait is normal.  ECG:  Nov. 12, 2014:  NSR at 37.  Normal ECG  Assessment / Plan:

## 2013-10-24 NOTE — Assessment & Plan Note (Signed)
He remains stable.  No angina .  Will check fasting lipids today.  i will see in 6 months.

## 2013-10-24 NOTE — Patient Instructions (Addendum)
Your physician wants you to follow-up in:6 months  You will receive a reminder letter in the mail two months in advance. If you don't receive a letter, please call our office to schedule the follow-up appointment.    Your physician recommends that you return for a FASTING lipid profile: today bmet lipid liver

## 2013-10-31 ENCOUNTER — Telehealth: Payer: Self-pay | Admitting: *Deleted

## 2013-10-31 NOTE — Telephone Encounter (Signed)
Notes Recorded by Vesta Mixer, MD on 10/26/2013 at 10:58 AM Labs are ok Advised pt of lab results.

## 2013-11-22 DIAGNOSIS — J209 Acute bronchitis, unspecified: Secondary | ICD-10-CM | POA: Diagnosis not present

## 2013-11-30 DIAGNOSIS — L821 Other seborrheic keratosis: Secondary | ICD-10-CM | POA: Diagnosis not present

## 2013-11-30 DIAGNOSIS — B353 Tinea pedis: Secondary | ICD-10-CM | POA: Diagnosis not present

## 2013-11-30 DIAGNOSIS — L570 Actinic keratosis: Secondary | ICD-10-CM | POA: Diagnosis not present

## 2013-11-30 DIAGNOSIS — Z85828 Personal history of other malignant neoplasm of skin: Secondary | ICD-10-CM | POA: Diagnosis not present

## 2014-01-22 ENCOUNTER — Telehealth: Payer: Self-pay

## 2014-01-22 NOTE — Telephone Encounter (Signed)
Patient called to get samples of crest 10 mg placed samples up front

## 2014-01-31 DIAGNOSIS — E119 Type 2 diabetes mellitus without complications: Secondary | ICD-10-CM | POA: Diagnosis not present

## 2014-01-31 DIAGNOSIS — I10 Essential (primary) hypertension: Secondary | ICD-10-CM | POA: Diagnosis not present

## 2014-01-31 DIAGNOSIS — Z1331 Encounter for screening for depression: Secondary | ICD-10-CM | POA: Diagnosis not present

## 2014-01-31 DIAGNOSIS — Z0000 Encounter for general adult medical examination without abnormal findings: Secondary | ICD-10-CM | POA: Diagnosis not present

## 2014-01-31 DIAGNOSIS — E785 Hyperlipidemia, unspecified: Secondary | ICD-10-CM | POA: Diagnosis not present

## 2014-01-31 DIAGNOSIS — Z23 Encounter for immunization: Secondary | ICD-10-CM | POA: Diagnosis not present

## 2014-04-24 ENCOUNTER — Other Ambulatory Visit: Payer: Medicare Other

## 2014-04-24 ENCOUNTER — Ambulatory Visit (INDEPENDENT_AMBULATORY_CARE_PROVIDER_SITE_OTHER): Payer: Medicare Other | Admitting: Cardiovascular Disease

## 2014-04-24 ENCOUNTER — Encounter: Payer: Self-pay | Admitting: Cardiovascular Disease

## 2014-04-24 VITALS — BP 141/70 | HR 58 | Ht 70.0 in | Wt 175.0 lb

## 2014-04-24 DIAGNOSIS — E785 Hyperlipidemia, unspecified: Secondary | ICD-10-CM

## 2014-04-24 DIAGNOSIS — I10 Essential (primary) hypertension: Secondary | ICD-10-CM

## 2014-04-24 DIAGNOSIS — I2510 Atherosclerotic heart disease of native coronary artery without angina pectoris: Secondary | ICD-10-CM | POA: Diagnosis not present

## 2014-04-24 MED ORDER — ASPIRIN 81 MG PO TABS: 81.0000 mg | ORAL_TABLET | Freq: Every day | ORAL | Status: DC

## 2014-04-24 MED ORDER — LISINOPRIL-HYDROCHLOROTHIAZIDE 20-25 MG PO TABS: 1.0000 | ORAL_TABLET | Freq: Every day | ORAL | Status: DC

## 2014-04-24 NOTE — Assessment & Plan Note (Signed)
Todd Armstrong is doing well. [name] not had any angina. We will decrease his aspirin to 81 mg a day.

## 2014-04-24 NOTE — Assessment & Plan Note (Signed)
His BP is a bit elevated.  We will increase his Lisinopril /  HCT to 20 / 25 mg a day.  Will check fasting labs in 3 weeks ( bmp, lipids, liver)   I will see him again in 6 months.

## 2014-04-24 NOTE — Progress Notes (Signed)
Todd Armstrong Date of Birth  05-01-1931 [facility] [address]     [place], [place]  [address] [phone]  Fax  [phone]   Problems: 1. CAD, s/p LAD stent 2009 2. HTN 3. Diabetes Mellitus 4. Hyperlipidemia   History of Present Illness:  Todd Armstrong is doing well from a cardiac standpoint. He has not had any episodes of chest pain or shortness breath. [name] been exercising on a regular basis.  He had a nice garden this year.    Apr 26, 2013:  Todd Armstrong is doing well.  No CP , no dyspnea.   He has put in his garden this year.    Nov. 12, 2014:  Apr 24, 2014:  Todd Armstrong is doing well.   No CP , no dyspnea.   hes getting his garden going.      Current Outpatient Prescriptions on File Prior to Visit  Medication Sig Dispense Refill  . aspirin 325 MG tablet Take 325 mg by mouth daily.        [name] atenolol (TENORMIN) 50 MG tablet TAKE ONE TABLET BY MOUTH EVERY DAY  90 tablet  3  . hydrochlorothiazide (,MICROZIDE/HYDRODIURIL,) 12.5 MG capsule Take 12.5 mg by mouth daily.        . metFORMIN (GLUCOPHAGE) 1000 MG tablet Take 1,000 mg by mouth 2 (two) times daily with a meal.        . nitroGLYCERIN (NITROSTAT) 0.4 MG SL tablet Place 0.4 mg under the tongue every 5 (five) minutes as needed.        . rosuvastatin (CRESTOR) 10 MG tablet Take 1 tablet (10 mg total) by mouth daily.  30 tablet  2   No current facility-administered medications on file prior to visit.    No Known Allergies  Past Medical History  Diagnosis Date  . Acute myocardial infarction   . Dyslipidemia   . Hyperlipemia   . Hypertension   . Coronary artery disease     PTCA and stenting of his right coronary artery. 3.5 x 16-mm Liberte stent.                Past Surgical History  Procedure Laterality Date  . Hernia repair  1984  . Cardiac catheterization  11/05/2008  . Cardiac catheterization  10/22/1993    EF 60%    History  Smoking status  . Former  Smoker  Smokeless tobacco  . Not on file    History  Alcohol Use No    Family History  Problem Relation Age of Onset  . Heart attack Father     Reviw of Systems:  Reviewed in the HPI.  All other systems are negative.  Physical Exam: BP 141/70  Pulse 58  Ht 5\' 10"  (1.778 m)  Wt 175 lb (79.379 kg)  BMI 25.11 kg/m2 The patient is alert and oriented x 3.  The mood and affect are normal.  The skin is warm and dry.  Color is normal.  The HEENT exam reveals that the sclera are nonicteric.  The mucous membranes are moist.  The carotids are 2+ without bruits.  There is no thyromegaly.  There is no JVD.  The lungs are clear.  The chest wall is non tender.  The heart exam reveals a regular rate with a normal S1 and S2.  There are no murmurs, gallops, or rubs.  The PMI is not displaced.   Abdominal exam reveals good bowel sounds.  There is no  guarding or rebound.  There is no hepatosplenomegaly or tenderness.  There are no masses.  Exam of the legs reveal no clubbing, cyanosis, or edema.  The legs are without rashes.  The distal pulses are intact.  Cranial nerves II - XII are intact.  Motor and sensory functions are intact.  The gait is normal.  ECG:  Nov. 12, 2014:  NSR at 48.  Normal ECG  Assessment / Plan:

## 2014-04-24 NOTE — Patient Instructions (Addendum)
Your physician recommends that you return for lab work in: 3 weeks on Wed. June 3 anytime between 7:30 am and 4:30 pm.  You will need to fast for this appointment.  Your physician has recommended you make the following change in your medication:  DECREASE Aspirin to 81 mg once daily INCREASE Lisinopril/HCTZ to 20/25 mg once daily'  Your physician wants you to follow-up in: 6 months with Dr. Acie Fredrickson.  You will receive a reminder letter in the mail two months in advance. If you don't receive a letter, please call our office to schedule the follow-up appointment.

## 2014-04-24 NOTE — Assessment & Plan Note (Signed)
Check his fasting lipids, liver enzymes, and basic metabolic profile in 3 weeks. Continue his same medications

## 2014-05-15 ENCOUNTER — Other Ambulatory Visit (INDEPENDENT_AMBULATORY_CARE_PROVIDER_SITE_OTHER): Payer: Medicare Other

## 2014-05-15 DIAGNOSIS — E785 Hyperlipidemia, unspecified: Secondary | ICD-10-CM | POA: Diagnosis not present

## 2014-05-15 DIAGNOSIS — I10 Essential (primary) hypertension: Secondary | ICD-10-CM | POA: Diagnosis not present

## 2014-05-15 LAB — LIPID PANEL
CHOL/HDL RATIO: 2
Cholesterol: 138 mg/dL (ref 0–200)
HDL: 56.8 mg/dL (ref >39.00)
LDL CALC: 52 mg/dL (ref 0–99)
NONHDL: 81.2
TRIGLYCERIDES: 146 mg/dL (ref 0.0–149.0)
VLDL: 29.2 mg/dL (ref 0.0–40.0)

## 2014-05-15 LAB — HEPATIC FUNCTION PANEL
ALBUMIN: 3.9 g/dL (ref 3.5–5.2)
ALK PHOS: 42 U/L (ref 39–117)
ALT: 18 U/L (ref 0–53)
AST: 20 U/L (ref 0–37)
Bilirubin, Direct: 0.1 mg/dL (ref 0.0–0.3)
Total Bilirubin: 0.8 mg/dL (ref 0.2–1.2)
Total Protein: 6.5 g/dL (ref 6.0–8.3)

## 2014-05-15 LAB — BASIC METABOLIC PANEL
BUN: 18 mg/dL (ref 6–23)
CHLORIDE: 100 meq/L (ref 96–112)
CO2: 28 mEq/L (ref 19–32)
Calcium: 8.8 mg/dL (ref 8.4–10.5)
Creatinine, Ser: 1 mg/dL (ref 0.4–1.5)
GFR: 72.82 mL/min (ref >60.00)
GLUCOSE: 158 mg/dL — AB (ref 70–99)
POTASSIUM: 4.4 meq/L (ref 3.5–5.1)
SODIUM: 136 meq/L (ref 135–145)

## 2014-05-31 DIAGNOSIS — Z85828 Personal history of other malignant neoplasm of skin: Secondary | ICD-10-CM | POA: Diagnosis not present

## 2014-05-31 DIAGNOSIS — L821 Other seborrheic keratosis: Secondary | ICD-10-CM | POA: Diagnosis not present

## 2014-05-31 DIAGNOSIS — L570 Actinic keratosis: Secondary | ICD-10-CM | POA: Diagnosis not present

## 2014-05-31 DIAGNOSIS — L820 Inflamed seborrheic keratosis: Secondary | ICD-10-CM | POA: Diagnosis not present

## 2014-07-31 DIAGNOSIS — I10 Essential (primary) hypertension: Secondary | ICD-10-CM | POA: Diagnosis not present

## 2014-07-31 DIAGNOSIS — E119 Type 2 diabetes mellitus without complications: Secondary | ICD-10-CM | POA: Diagnosis not present

## 2014-08-02 DIAGNOSIS — E119 Type 2 diabetes mellitus without complications: Secondary | ICD-10-CM | POA: Diagnosis not present

## 2014-08-02 DIAGNOSIS — L608 Other nail disorders: Secondary | ICD-10-CM | POA: Diagnosis not present

## 2014-08-02 DIAGNOSIS — L84 Corns and callosities: Secondary | ICD-10-CM | POA: Diagnosis not present

## 2014-10-07 DIAGNOSIS — Z23 Encounter for immunization: Secondary | ICD-10-CM | POA: Diagnosis not present

## 2014-10-21 ENCOUNTER — Ambulatory Visit (INDEPENDENT_AMBULATORY_CARE_PROVIDER_SITE_OTHER): Payer: Medicare Other | Admitting: Cardiovascular Disease

## 2014-10-21 ENCOUNTER — Encounter: Payer: Self-pay | Admitting: Cardiovascular Disease

## 2014-10-21 VITALS — BP 132/68 | HR 57 | Ht 70.0 in | Wt 168.4 lb

## 2014-10-21 DIAGNOSIS — I2510 Atherosclerotic heart disease of native coronary artery without angina pectoris: Secondary | ICD-10-CM | POA: Diagnosis not present

## 2014-10-21 DIAGNOSIS — E785 Hyperlipidemia, unspecified: Secondary | ICD-10-CM

## 2014-10-21 DIAGNOSIS — I10 Essential (primary) hypertension: Secondary | ICD-10-CM | POA: Diagnosis not present

## 2014-10-21 NOTE — Assessment & Plan Note (Signed)
His BP is better. Continue same meds .

## 2014-10-21 NOTE — Progress Notes (Signed)
Todd Armstrong Date of Birth  Apr 27, 1931 [facility] [address]     [place], [place]  [address] [phone]  Fax  [phone]   Problems: 1. CAD, s/p LAD stent 2009 2. HTN 3. Diabetes Mellitus 4. Hyperlipidemia   History of Present Illness:  Todd Armstrong is doing well from a cardiac standpoint. He has not had any episodes of chest pain or shortness breath. [name] been exercising on a regular basis.  He had a nice garden this year.    Apr 26, 2013:  Todd Armstrong is doing well.  No CP , no dyspnea.   He has put in his garden this year.    Nov. 12, 2014:  Apr 24, 2014:  Todd Armstrong is doing well.   No CP , no dyspnea.   hes getting his garden going.     Nov. 9, 2015:   Todd Armstrong is doing well.  His BP was elevated at his last visit.  We increased his Lisinopril/HCTZ and his Bp is much better.     Current Outpatient Prescriptions on File Prior to Visit  Medication Sig Dispense Refill  . aspirin 81 MG tablet Take 1 tablet (81 mg total) by mouth daily.    [name] atenolol (TENORMIN) 50 MG tablet TAKE ONE TABLET BY MOUTH EVERY DAY 90 tablet 3  . glimepiride (AMARYL) 1 MG tablet Take 1 mg by mouth daily with breakfast.     . lisinopril-hydrochlorothiazide (PRINZIDE,ZESTORETIC) 20-25 MG per tablet Take 1 tablet by mouth daily. 90 tablet 3  . metFORMIN (GLUCOPHAGE) 1000 MG tablet Take 1,000 mg by mouth 2 (two) times daily with a meal.      . nitroGLYCERIN (NITROSTAT) 0.4 MG SL tablet Place 0.4 mg under the tongue every 5 (five) minutes as needed.      . rosuvastatin (CRESTOR) 10 MG tablet Take 1 tablet (10 mg total) by mouth daily. 30 tablet 2   No current facility-administered medications on file prior to visit.    No Known Allergies  Past Medical History  Diagnosis Date  . Acute myocardial infarction   . Dyslipidemia   . Hyperlipemia   . Hypertension   . Coronary artery disease     PTCA and stenting of his right coronary artery. 3.5  x 16-mm Liberte stent.                Past Surgical History  Procedure Laterality Date  . Hernia repair  1984  . Cardiac catheterization  11/05/2008  . Cardiac catheterization  10/22/1993    EF 60%    History  Smoking status  . Former Smoker  Smokeless tobacco  . Not on file    History  Alcohol Use No    Family History  Problem Relation Age of Onset  . Heart attack Father     Reviw of Systems:  Reviewed in the HPI.  All other systems are negative.  Physical Exam: BP 132/68 mmHg  Pulse 57  Ht 5\' 10"  (1.778 m)  Wt 168 lb 6.4 oz (76.386 kg)  BMI 24.16 kg/m2 The patient is alert and oriented x 3.  The mood and affect are normal.  The skin is warm and dry.  Color is normal.  The HEENT exam reveals that the sclera are nonicteric.  The mucous membranes are moist.  The carotids are 2+ without bruits.  There is no thyromegaly.  There is no JVD.  The lungs are clear.  The chest wall is  non tender.  The heart exam reveals a regular rate with a normal S1 and S2.  There are no murmurs, gallops, or rubs.  The PMI is not displaced.   Abdominal exam reveals good bowel sounds.  There is no guarding or rebound.  There is no hepatosplenomegaly or tenderness.  There are no masses.  Exam of the legs reveal no clubbing, cyanosis, or edema.  The legs are without rashes.  The distal pulses are intact.  Cranial nerves II - XII are intact.  Motor and sensory functions are intact.  The gait is normal.  ECG:  Nov. 9, 2015:     Sinus brady at 30,  1st degree av block   Assessment / Plan:

## 2014-10-21 NOTE — Assessment & Plan Note (Signed)
Will check fasting labs in 6 months.

## 2014-10-21 NOTE — Patient Instructions (Signed)

## 2014-10-21 NOTE — Assessment & Plan Note (Signed)
No angina. Check fasting labs in 6 months.

## 2014-10-29 DIAGNOSIS — R351 Nocturia: Secondary | ICD-10-CM | POA: Diagnosis not present

## 2014-10-29 DIAGNOSIS — N400 Enlarged prostate without lower urinary tract symptoms: Secondary | ICD-10-CM | POA: Diagnosis not present

## 2014-10-31 DIAGNOSIS — E1151 Type 2 diabetes mellitus with diabetic peripheral angiopathy without gangrene: Secondary | ICD-10-CM | POA: Diagnosis not present

## 2014-10-31 DIAGNOSIS — L84 Corns and callosities: Secondary | ICD-10-CM | POA: Diagnosis not present

## 2014-10-31 DIAGNOSIS — B351 Tinea unguium: Secondary | ICD-10-CM | POA: Diagnosis not present

## 2014-11-28 DIAGNOSIS — L853 Xerosis cutis: Secondary | ICD-10-CM | POA: Diagnosis not present

## 2014-11-28 DIAGNOSIS — Z85828 Personal history of other malignant neoplasm of skin: Secondary | ICD-10-CM | POA: Diagnosis not present

## 2014-11-28 DIAGNOSIS — L821 Other seborrheic keratosis: Secondary | ICD-10-CM | POA: Diagnosis not present

## 2014-11-28 DIAGNOSIS — L812 Freckles: Secondary | ICD-10-CM | POA: Diagnosis not present

## 2014-11-28 DIAGNOSIS — D1801 Hemangioma of skin and subcutaneous tissue: Secondary | ICD-10-CM | POA: Diagnosis not present

## 2014-11-28 DIAGNOSIS — L218 Other seborrheic dermatitis: Secondary | ICD-10-CM | POA: Diagnosis not present

## 2014-11-28 DIAGNOSIS — L570 Actinic keratosis: Secondary | ICD-10-CM | POA: Diagnosis not present

## 2015-01-01 DIAGNOSIS — E1151 Type 2 diabetes mellitus with diabetic peripheral angiopathy without gangrene: Secondary | ICD-10-CM | POA: Diagnosis not present

## 2015-01-01 DIAGNOSIS — L84 Corns and callosities: Secondary | ICD-10-CM | POA: Diagnosis not present

## 2015-01-01 DIAGNOSIS — L602 Onychogryphosis: Secondary | ICD-10-CM | POA: Diagnosis not present

## 2015-01-08 ENCOUNTER — Telehealth: Payer: Self-pay | Admitting: *Deleted

## 2015-01-08 NOTE — Telephone Encounter (Signed)
Crestor samples placed at the front desk for patient. 

## 2015-01-13 ENCOUNTER — Other Ambulatory Visit: Payer: Self-pay | Admitting: Cardiovascular Disease

## 2015-01-31 ENCOUNTER — Telehealth: Payer: Self-pay | Admitting: *Deleted

## 2015-01-31 NOTE — Telephone Encounter (Signed)
Crestor samples placed at the front desk for patient. 

## 2015-02-03 DIAGNOSIS — E119 Type 2 diabetes mellitus without complications: Secondary | ICD-10-CM | POA: Diagnosis not present

## 2015-02-03 DIAGNOSIS — I10 Essential (primary) hypertension: Secondary | ICD-10-CM | POA: Diagnosis not present

## 2015-03-03 DIAGNOSIS — J069 Acute upper respiratory infection, unspecified: Secondary | ICD-10-CM | POA: Diagnosis not present

## 2015-03-20 DIAGNOSIS — L84 Corns and callosities: Secondary | ICD-10-CM | POA: Diagnosis not present

## 2015-03-20 DIAGNOSIS — E1151 Type 2 diabetes mellitus with diabetic peripheral angiopathy without gangrene: Secondary | ICD-10-CM | POA: Diagnosis not present

## 2015-03-20 DIAGNOSIS — L602 Onychogryphosis: Secondary | ICD-10-CM | POA: Diagnosis not present

## 2015-04-14 ENCOUNTER — Other Ambulatory Visit: Payer: Self-pay | Admitting: Cardiovascular Disease

## 2015-05-13 ENCOUNTER — Other Ambulatory Visit (INDEPENDENT_AMBULATORY_CARE_PROVIDER_SITE_OTHER): Payer: Medicare Other | Admitting: *Deleted

## 2015-05-13 DIAGNOSIS — E785 Hyperlipidemia, unspecified: Secondary | ICD-10-CM

## 2015-05-13 DIAGNOSIS — I10 Essential (primary) hypertension: Secondary | ICD-10-CM

## 2015-05-13 LAB — BASIC METABOLIC PANEL
BUN: 23 mg/dL (ref 6–23)
CALCIUM: 9 mg/dL (ref 8.4–10.5)
CO2: 30 meq/L (ref 19–32)
CREATININE: 1.15 mg/dL (ref 0.40–1.50)
Chloride: 103 mEq/L (ref 96–112)
GFR: 64.68 mL/min (ref >60.00)
GLUCOSE: 140 mg/dL — AB (ref 70–99)
Potassium: 4.2 mEq/L (ref 3.5–5.1)
Sodium: 138 mEq/L (ref 135–145)

## 2015-05-13 LAB — HEPATIC FUNCTION PANEL
ALT: 13 U/L (ref 0–53)
AST: 15 U/L (ref 0–37)
Albumin: 4 g/dL (ref 3.5–5.2)
Alkaline Phosphatase: 43 U/L (ref 39–117)
Bilirubin, Direct: 0.1 mg/dL (ref 0.0–0.3)
Total Bilirubin: 0.5 mg/dL (ref 0.2–1.2)
Total Protein: 6.5 g/dL (ref 6.0–8.3)

## 2015-05-15 ENCOUNTER — Encounter: Payer: Self-pay | Admitting: Cardiovascular Disease

## 2015-05-15 ENCOUNTER — Ambulatory Visit (INDEPENDENT_AMBULATORY_CARE_PROVIDER_SITE_OTHER): Payer: Medicare Other | Admitting: Cardiovascular Disease

## 2015-05-15 VITALS — BP 140/76 | HR 60 | Ht 70.0 in | Wt 166.0 lb

## 2015-05-15 DIAGNOSIS — I2510 Atherosclerotic heart disease of native coronary artery without angina pectoris: Secondary | ICD-10-CM | POA: Diagnosis not present

## 2015-05-15 DIAGNOSIS — I10 Essential (primary) hypertension: Secondary | ICD-10-CM | POA: Diagnosis not present

## 2015-05-15 DIAGNOSIS — E785 Hyperlipidemia, unspecified: Secondary | ICD-10-CM

## 2015-05-15 LAB — LIPID PANEL
Cholesterol: 129 mg/dL (ref 0–200)
HDL: 58.8 mg/dL (ref >39.00)
LDL Cholesterol: 55 mg/dL (ref 0–99)
NonHDL: 70.2
TRIGLYCERIDES: 78 mg/dL (ref 0.0–149.0)
Total CHOL/HDL Ratio: 2
VLDL: 15.6 mg/dL (ref 0.0–40.0)

## 2015-05-15 NOTE — Progress Notes (Signed)
Cardiology Office Note   Date:  05/15/2015   ID:  Todd Armstrong, DOB [birth year], MRN [id number]  PCP:  Irven Shelling, MD  Cardiologist:   Thayer Headings, MD   Chief Complaint  Patient presents with  . Coronary Artery Disease   1. CAD, s/p LAD stent 2009 2. HTN 3. Diabetes Mellitus 4. Hyperlipidemia   History of Present Illness:  Todd Armstrong is doing well from a cardiac standpoint. He has not had any episodes of chest pain or shortness breath. [name] been exercising on a regular basis. He had a nice garden this year.   Apr 26, 2013:  Todd Armstrong is doing well. No CP , no dyspnea. He has put in his garden this year.   Nov. 12, 2014:  Apr 24, 2014:  Todd Armstrong is doing well. No CP , no dyspnea.  hes getting his garden going.   Nov. 9, 2015:  Todd Armstrong is doing well. His BP was elevated at his last visit. We increased his Lisinopril/HCTZ and his Bp is much better.    May 15, 2015:   Todd Armstrong is a 81 y.o. male who presents for his CAD Doing well.   No CP or dyspnea.  Still caring for his wife who is ill.  Still very busy working out in the garden    Past Medical History  Diagnosis Date  . Acute myocardial infarction   . Dyslipidemia   . Hyperlipemia   . Hypertension   . Coronary artery disease     PTCA and stenting of his right coronary artery. 3.5 x 16-mm Liberte stent.                Past Surgical History  Procedure Laterality Date  . Hernia repair  1984  . Cardiac catheterization  11/05/2008  . Cardiac catheterization  10/22/1993    EF 60%     Current Outpatient Prescriptions  Medication Sig Dispense Refill  . aspirin 81 MG tablet Take 1 tablet (81 mg total) by mouth daily.    [name] atenolol (TENORMIN) 50 MG tablet TAKE ONE TABLET BY MOUTH ONCE DAILY 90 tablet 0  . glimepiride (AMARYL) 1 MG tablet Take 5 mg by mouth daily with breakfast.     . lisinopril-hydrochlorothiazide (PRINZIDE,ZESTORETIC) 20-25 MG per tablet Take 1 tablet by  mouth daily. 90 tablet 3  . metFORMIN (GLUCOPHAGE) 1000 MG tablet Take 1,000 mg by mouth 2 (two) times daily with a meal.      . nitroGLYCERIN (NITROSTAT) 0.4 MG SL tablet Place 0.4 mg under the tongue every 5 (five) minutes as needed.      . rosuvastatin (CRESTOR) 10 MG tablet Take 1 tablet (10 mg total) by mouth daily. 30 tablet 2   No current facility-administered medications for this visit.    Allergies:   Review of patient's allergies indicates no known allergies.    Social History:  The patient  reports that he has quit smoking. He does not have any smokeless tobacco history on file. He reports that he does not drink alcohol or use illicit drugs.   Family History:  The patient's family history includes Heart attack in his father.    ROS:  Please see the history of present illness.    Review of Systems: Constitutional:  denies fever, chills, diaphoresis, appetite change and fatigue.  HEENT: denies photophobia, eye pain, redness, hearing loss, ear pain, congestion, sore throat, rhinorrhea, sneezing, neck pain, neck stiffness and tinnitus.  Respiratory: denies SOB, DOE, cough, chest  tightness, and wheezing.  Cardiovascular: denies chest pain, palpitations and leg swelling.  Gastrointestinal: denies nausea, vomiting, abdominal pain, diarrhea, constipation, blood in stool.  Genitourinary: denies dysuria, urgency, frequency, hematuria, flank pain and difficulty urinating.  Musculoskeletal: denies  myalgias, back pain, joint swelling, arthralgias and gait problem.   Skin: denies pallor, rash and wound.  Neurological: denies dizziness, seizures, syncope, weakness, light-headedness, numbness and headaches.   Hematological: denies adenopathy, easy bruising, personal or family bleeding history.  Psychiatric/ Behavioral: denies suicidal ideation, mood changes, confusion, nervousness, sleep disturbance and agitation.       All other systems are reviewed and negative.    PHYSICAL  EXAM: VS:  BP 140/76 mmHg  Pulse 60  Ht 5\' 10"  (1.778 m)  Wt 75.297 kg (166 lb)  BMI 23.82 kg/m2  SpO2 96% , BMI Body mass index is 23.82 kg/(m^2). GEN: Well nourished, well developed, in no acute distress HEENT: normal Neck: no JVD, carotid bruits, or masses Cardiac: RRR; no murmurs, rubs, or gallops,no edema  Respiratory:  clear to auscultation bilaterally, normal work of breathing GI: soft, nontender, nondistended, + BS MS: no deformity or atrophy Skin: warm and dry, no rash Neuro:  Strength and sensation are intact Psych: normal   EKG:  EKG is not ordered today.    Recent Labs: 05/13/2015: ALT 13; BUN 23; Creatinine 1.15; Potassium 4.2; Sodium 138    Lipid Panel    Component Value Date/Time   CHOL 138 05/15/2014 0940   TRIG 146.0 05/15/2014 0940   HDL 56.80 05/15/2014 0940   CHOLHDL 2 05/15/2014 0940   VLDL 29.2 05/15/2014 0940   LDLCALC 52 05/15/2014 0940      Wt Readings from Last 3 Encounters:  05/15/15 75.297 kg (166 lb)  10/21/14 76.386 kg (168 lb 6.4 oz)  04/24/14 79.379 kg (175 lb)      Other studies Reviewed: Additional studies/ records that were reviewed today include: . Review of the above records demonstrates:    ASSESSMENT AND PLAN:  1. CAD, s/p LAD stent 2009 - he's doing very well. [name] not having any episodes of chest pain. 2. HTN - blood pressure is well-controlled. Continue current medications. 3. Diabetes Mellitus 4. Hyperlipidemia - his basic metabolic profile and liver enzymes looked great several days ago. Will need to add on the lipid profile which was not done for some reason.   Current medicines are reviewed at length with the patient today.  The patient does not have concerns regarding medicines.  The following changes have been made:  no change  Labs/ tests ordered today include:  No orders of the defined types were placed in this encounter.     Disposition:   FU with me in 6 months      Dashana Guizar, Wonda Cheng, MD   05/15/2015 11:40 AM    [facility] [place], [place], [place]  [address] Phone: [phone]; Fax: [phone]   [facility]  [address] [place], [place]  [address] [phone]    Fax [phone]

## 2015-05-15 NOTE — Addendum Note (Signed)
Addended by: Eulis Foster on: 05/15/2015 11:52 AM   Modules accepted: Orders

## 2015-05-15 NOTE — Patient Instructions (Addendum)
Medication Instructions:  Your physician recommends that you continue on your current medications as directed. Please refer to the Current Medication list given to you today.   Labwork: None Ordered   Testing/Procedures: None Ordered   Follow-Up: Your physician wants you to follow-up in: 6 months with Dr. Nahser.  You will receive a reminder letter in the mail two months in advance. If you don't receive a letter, please call our office to schedule the follow-up appointment.     

## 2015-05-21 DIAGNOSIS — L84 Corns and callosities: Secondary | ICD-10-CM | POA: Diagnosis not present

## 2015-05-21 DIAGNOSIS — E1151 Type 2 diabetes mellitus with diabetic peripheral angiopathy without gangrene: Secondary | ICD-10-CM | POA: Diagnosis not present

## 2015-05-21 DIAGNOSIS — L602 Onychogryphosis: Secondary | ICD-10-CM | POA: Diagnosis not present

## 2015-06-10 DIAGNOSIS — D1801 Hemangioma of skin and subcutaneous tissue: Secondary | ICD-10-CM | POA: Diagnosis not present

## 2015-06-10 DIAGNOSIS — L281 Prurigo nodularis: Secondary | ICD-10-CM | POA: Diagnosis not present

## 2015-06-10 DIAGNOSIS — L570 Actinic keratosis: Secondary | ICD-10-CM | POA: Diagnosis not present

## 2015-06-10 DIAGNOSIS — L821 Other seborrheic keratosis: Secondary | ICD-10-CM | POA: Diagnosis not present

## 2015-06-10 DIAGNOSIS — L812 Freckles: Secondary | ICD-10-CM | POA: Diagnosis not present

## 2015-06-10 DIAGNOSIS — D485 Neoplasm of uncertain behavior of skin: Secondary | ICD-10-CM | POA: Diagnosis not present

## 2015-06-10 DIAGNOSIS — Z85828 Personal history of other malignant neoplasm of skin: Secondary | ICD-10-CM | POA: Diagnosis not present

## 2015-07-15 ENCOUNTER — Other Ambulatory Visit: Payer: Self-pay | Admitting: Cardiovascular Disease

## 2015-07-22 DIAGNOSIS — E1151 Type 2 diabetes mellitus with diabetic peripheral angiopathy without gangrene: Secondary | ICD-10-CM | POA: Diagnosis not present

## 2015-07-22 DIAGNOSIS — L84 Corns and callosities: Secondary | ICD-10-CM | POA: Diagnosis not present

## 2015-07-22 DIAGNOSIS — L602 Onychogryphosis: Secondary | ICD-10-CM | POA: Diagnosis not present

## 2015-07-25 ENCOUNTER — Other Ambulatory Visit: Payer: Self-pay | Admitting: Cardiovascular Disease

## 2015-08-05 DIAGNOSIS — Z23 Encounter for immunization: Secondary | ICD-10-CM | POA: Diagnosis not present

## 2015-08-05 DIAGNOSIS — Z1389 Encounter for screening for other disorder: Secondary | ICD-10-CM | POA: Diagnosis not present

## 2015-08-05 DIAGNOSIS — E119 Type 2 diabetes mellitus without complications: Secondary | ICD-10-CM | POA: Diagnosis not present

## 2015-08-05 DIAGNOSIS — I10 Essential (primary) hypertension: Secondary | ICD-10-CM | POA: Diagnosis not present

## 2015-08-29 DIAGNOSIS — H10413 Chronic giant papillary conjunctivitis, bilateral: Secondary | ICD-10-CM | POA: Diagnosis not present

## 2015-08-29 DIAGNOSIS — H43811 Vitreous degeneration, right eye: Secondary | ICD-10-CM | POA: Diagnosis not present

## 2015-08-29 DIAGNOSIS — H2513 Age-related nuclear cataract, bilateral: Secondary | ICD-10-CM | POA: Diagnosis not present

## 2015-08-29 DIAGNOSIS — H04123 Dry eye syndrome of bilateral lacrimal glands: Secondary | ICD-10-CM | POA: Diagnosis not present

## 2015-09-29 DIAGNOSIS — E1151 Type 2 diabetes mellitus with diabetic peripheral angiopathy without gangrene: Secondary | ICD-10-CM | POA: Diagnosis not present

## 2015-09-29 DIAGNOSIS — L84 Corns and callosities: Secondary | ICD-10-CM | POA: Diagnosis not present

## 2015-09-29 DIAGNOSIS — L602 Onychogryphosis: Secondary | ICD-10-CM | POA: Diagnosis not present

## 2015-10-11 ENCOUNTER — Other Ambulatory Visit: Payer: Self-pay | Admitting: Cardiovascular Disease

## 2015-11-11 ENCOUNTER — Encounter: Payer: Self-pay | Admitting: Cardiovascular Disease

## 2015-11-11 ENCOUNTER — Ambulatory Visit (INDEPENDENT_AMBULATORY_CARE_PROVIDER_SITE_OTHER): Payer: Medicare Other | Admitting: Cardiovascular Disease

## 2015-11-11 VITALS — BP 132/74 | HR 65 | Ht 70.0 in | Wt 166.4 lb

## 2015-11-11 DIAGNOSIS — E785 Hyperlipidemia, unspecified: Secondary | ICD-10-CM

## 2015-11-11 DIAGNOSIS — I2510 Atherosclerotic heart disease of native coronary artery without angina pectoris: Secondary | ICD-10-CM | POA: Diagnosis not present

## 2015-11-11 NOTE — Patient Instructions (Signed)

## 2015-11-11 NOTE — Progress Notes (Signed)
Cardiology Office Note   Date:  11/11/2015   ID:  Todd Armstrong, DOB [birth year], MRN [id number]  PCP:  Todd Shelling, MD  Cardiologist:   Todd Headings, MD   Chief Complaint  Patient presents with  . Follow-up    CAD   1. CAD, s/p LAD stent 2009 2. HTN 3. Diabetes Mellitus 4. Hyperlipidemia   History of Present Illness:  Todd Armstrong is doing well from a cardiac standpoint. He has not had any episodes of chest pain or shortness breath. [name] been exercising on a regular basis. He had a nice garden this year.   Apr 26, 2013:  Todd Armstrong is doing well. No CP , no dyspnea. He has put in his garden this year.   Nov. 12, 2014:  Apr 24, 2014:  Todd Armstrong is doing well. No CP , no dyspnea.  hes getting his garden going.   Nov. 9, 2015:  Todd Armstrong is doing well. His BP was elevated at his last visit. We increased his Lisinopril/HCTZ and his Bp is much better.    May 15, 2015:   Todd Armstrong is a 81 y.o. male who presents for his CAD Doing well.   No CP or dyspnea.  Still caring for his wife who is ill.  Still very busy working out in the garden   Nov. 29, 2016:  Doing well.  No CP or dyspnea.  Still very busy taking care of his wife, Todd Armstrong  She is having lots of bleeding from her bowels.    Past Medical History  Diagnosis Date  . Acute myocardial infarction   . Dyslipidemia   . Hyperlipemia   . Hypertension   . Coronary artery disease     PTCA and stenting of his right coronary artery. 3.5 x 16-mm Liberte stent.                Past Surgical History  Procedure Laterality Date  . Hernia repair  1984  . Cardiac catheterization  11/05/2008  . Cardiac catheterization  10/22/1993    EF 60%     Current Outpatient Prescriptions  Medication Sig Dispense Refill  . aspirin 81 MG tablet Take 1 tablet (81 mg total) by mouth daily.    [name] atenolol (TENORMIN) 50 MG tablet TAKE ONE TABLET BY MOUTH ONCE DAILY 90 tablet 3  . glimepiride (AMARYL)  1 MG tablet Take 0.5 mg by mouth daily with breakfast.     . lisinopril-hydrochlorothiazide (PRINZIDE,ZESTORETIC) 20-25 MG per tablet TAKE ONE TABLET BY MOUTH ONCE DAILY 90 tablet 1  . metFORMIN (GLUCOPHAGE) 1000 MG tablet Take 1,000 mg by mouth 2 (two) times daily with a meal.      . nitroGLYCERIN (NITROSTAT) 0.4 MG SL tablet Place 0.4 mg under the tongue every 5 (five) minutes as needed.      . rosuvastatin (CRESTOR) 10 MG tablet Take 1 tablet (10 mg total) by mouth daily. 30 tablet 2   No current facility-administered medications for this visit.    Allergies:   Review of patient's allergies indicates no known allergies.    Social History:  The patient  reports that he has quit smoking. He does not have any smokeless tobacco history on file. He reports that he does not drink alcohol or use illicit drugs.   Family History:  The patient's family history includes Heart attack in his father.    ROS:  Please see the history of present illness.    Review of Systems: Constitutional:  denies  fever, chills, diaphoresis, appetite change and fatigue.  HEENT: denies photophobia, eye pain, redness, hearing loss, ear pain, congestion, sore throat, rhinorrhea, sneezing, neck pain, neck stiffness and tinnitus.  Respiratory: denies SOB, DOE, cough, chest tightness, and wheezing.  Cardiovascular: denies chest pain, palpitations and leg swelling.  Gastrointestinal: denies nausea, vomiting, abdominal pain, diarrhea, constipation, blood in stool.  Genitourinary: denies dysuria, urgency, frequency, hematuria, flank pain and difficulty urinating.  Musculoskeletal: denies  myalgias, back pain, joint swelling, arthralgias and gait problem.   Skin: denies pallor, rash and wound.  Neurological: denies dizziness, seizures, syncope, weakness, light-headedness, numbness and headaches.   Hematological: denies adenopathy, easy bruising, personal or family bleeding history.  Psychiatric/ Behavioral: denies suicidal  ideation, mood changes, confusion, nervousness, sleep disturbance and agitation.       All other systems are reviewed and negative.    PHYSICAL EXAM: VS:  BP 132/74 mmHg  Pulse 65  Ht 5\' 10"  (1.778 m)  Wt 166 lb 6.4 oz (75.479 kg)  BMI 23.88 kg/m2  SpO2 96% , BMI Body mass index is 23.88 kg/(m^2). GEN: Well nourished, well developed, in no acute distress HEENT: normal Neck: no JVD, carotid bruits, or masses Cardiac: RRR; no murmurs, rubs, or gallops,no edema  Respiratory:  clear to auscultation bilaterally, normal work of breathing GI: soft, nontender, nondistended, + BS MS: no deformity or atrophy Skin: warm and dry, no rash Neuro:  Strength and sensation are intact Psych: normal   EKG:  EKG is not ordered today.    Recent Labs: 05/13/2015: ALT 13; BUN 23; Creatinine, Ser 1.15; Potassium 4.2; Sodium 138    Lipid Panel    Component Value Date/Time   CHOL 129 05/15/2015 1152   TRIG 78.0 05/15/2015 1152   HDL 58.80 05/15/2015 1152   CHOLHDL 2 05/15/2015 1152   VLDL 15.6 05/15/2015 1152   LDLCALC 55 05/15/2015 1152      Wt Readings from Last 3 Encounters:  11/11/15 166 lb 6.4 oz (75.479 kg)  05/15/15 166 lb (75.297 kg)  10/21/14 168 lb 6.4 oz (76.386 kg)      Other studies Reviewed: Additional studies/ records that were reviewed today include: . Review of the above records demonstrates:    ASSESSMENT AND PLAN:  1. CAD, s/p LAD stent 2009 - he's doing very well. [name] not having any episodes of chest pain. 2. HTN - blood pressure is well-controlled. Continue current medications. 3. Diabetes Mellitus 4. Hyperlipidemia - will check lipids at next visit in 6 months     Current medicines are reviewed at length with the patient today.  The patient does not have concerns regarding medicines.  The following changes have been made:  no change  Labs/ tests ordered today include:  No orders of the defined types were placed in this encounter.     Disposition:    FU with me in 6 months      Todd Armstrong, Wonda Cheng, MD  11/11/2015 11:41 AM    [facility] [place], [place], [place]  [address] Phone: [phone]; Fax: [phone]   [facility]  [address] [place], [place]  [address] [phone]    Fax [phone]

## 2015-11-17 ENCOUNTER — Other Ambulatory Visit: Payer: Self-pay | Admitting: *Deleted

## 2015-11-17 MED ORDER — ROSUVASTATIN CALCIUM 10 MG PO TABS: 10.0000 mg | ORAL_TABLET | Freq: Every day | ORAL | Status: DC

## 2015-12-01 DIAGNOSIS — Z85828 Personal history of other malignant neoplasm of skin: Secondary | ICD-10-CM | POA: Diagnosis not present

## 2015-12-01 DIAGNOSIS — L308 Other specified dermatitis: Secondary | ICD-10-CM | POA: Diagnosis not present

## 2015-12-01 DIAGNOSIS — L570 Actinic keratosis: Secondary | ICD-10-CM | POA: Diagnosis not present

## 2015-12-01 DIAGNOSIS — L821 Other seborrheic keratosis: Secondary | ICD-10-CM | POA: Diagnosis not present

## 2015-12-03 DIAGNOSIS — E1351 Other specified diabetes mellitus with diabetic peripheral angiopathy without gangrene: Secondary | ICD-10-CM | POA: Diagnosis not present

## 2015-12-03 DIAGNOSIS — L602 Onychogryphosis: Secondary | ICD-10-CM | POA: Diagnosis not present

## 2015-12-03 DIAGNOSIS — L84 Corns and callosities: Secondary | ICD-10-CM | POA: Diagnosis not present

## 2015-12-03 DIAGNOSIS — I70293 Other atherosclerosis of native arteries of extremities, bilateral legs: Secondary | ICD-10-CM | POA: Diagnosis not present

## 2016-02-04 DIAGNOSIS — L84 Corns and callosities: Secondary | ICD-10-CM | POA: Diagnosis not present

## 2016-02-04 DIAGNOSIS — I70293 Other atherosclerosis of native arteries of extremities, bilateral legs: Secondary | ICD-10-CM | POA: Diagnosis not present

## 2016-02-04 DIAGNOSIS — L602 Onychogryphosis: Secondary | ICD-10-CM | POA: Diagnosis not present

## 2016-02-04 DIAGNOSIS — E1351 Other specified diabetes mellitus with diabetic peripheral angiopathy without gangrene: Secondary | ICD-10-CM | POA: Diagnosis not present

## 2016-02-05 DIAGNOSIS — I252 Old myocardial infarction: Secondary | ICD-10-CM | POA: Diagnosis not present

## 2016-02-05 DIAGNOSIS — Z0000 Encounter for general adult medical examination without abnormal findings: Secondary | ICD-10-CM | POA: Diagnosis not present

## 2016-02-05 DIAGNOSIS — E119 Type 2 diabetes mellitus without complications: Secondary | ICD-10-CM | POA: Diagnosis not present

## 2016-02-05 DIAGNOSIS — I2510 Atherosclerotic heart disease of native coronary artery without angina pectoris: Secondary | ICD-10-CM | POA: Diagnosis not present

## 2016-02-05 DIAGNOSIS — Z7984 Long term (current) use of oral hypoglycemic drugs: Secondary | ICD-10-CM | POA: Diagnosis not present

## 2016-02-05 DIAGNOSIS — E7800 Pure hypercholesterolemia, unspecified: Secondary | ICD-10-CM | POA: Diagnosis not present

## 2016-02-05 DIAGNOSIS — I10 Essential (primary) hypertension: Secondary | ICD-10-CM | POA: Diagnosis not present

## 2016-02-05 DIAGNOSIS — Z1389 Encounter for screening for other disorder: Secondary | ICD-10-CM | POA: Diagnosis not present

## 2016-02-25 ENCOUNTER — Other Ambulatory Visit: Payer: Self-pay | Admitting: Cardiovascular Disease

## 2016-03-04 ENCOUNTER — Other Ambulatory Visit: Payer: Self-pay | Admitting: *Deleted

## 2016-03-04 MED ORDER — ROSUVASTATIN CALCIUM 10 MG PO TABS: 10.0000 mg | ORAL_TABLET | Freq: Every day | ORAL | Status: DC

## 2016-04-05 DIAGNOSIS — D485 Neoplasm of uncertain behavior of skin: Secondary | ICD-10-CM | POA: Diagnosis not present

## 2016-04-05 DIAGNOSIS — D692 Other nonthrombocytopenic purpura: Secondary | ICD-10-CM | POA: Diagnosis not present

## 2016-04-05 DIAGNOSIS — Z85828 Personal history of other malignant neoplasm of skin: Secondary | ICD-10-CM | POA: Diagnosis not present

## 2016-04-05 DIAGNOSIS — L570 Actinic keratosis: Secondary | ICD-10-CM | POA: Diagnosis not present

## 2016-05-05 DIAGNOSIS — L602 Onychogryphosis: Secondary | ICD-10-CM | POA: Diagnosis not present

## 2016-05-05 DIAGNOSIS — E1151 Type 2 diabetes mellitus with diabetic peripheral angiopathy without gangrene: Secondary | ICD-10-CM | POA: Diagnosis not present

## 2016-05-05 DIAGNOSIS — L84 Corns and callosities: Secondary | ICD-10-CM | POA: Diagnosis not present

## 2016-05-05 DIAGNOSIS — E1351 Other specified diabetes mellitus with diabetic peripheral angiopathy without gangrene: Secondary | ICD-10-CM | POA: Diagnosis not present

## 2016-05-13 ENCOUNTER — Encounter: Payer: Self-pay | Admitting: Cardiovascular Disease

## 2016-05-13 ENCOUNTER — Ambulatory Visit (INDEPENDENT_AMBULATORY_CARE_PROVIDER_SITE_OTHER): Payer: Medicare Other | Admitting: Cardiovascular Disease

## 2016-05-13 VITALS — BP 110/52 | HR 55 | Ht 70.0 in | Wt 162.4 lb

## 2016-05-13 DIAGNOSIS — I2510 Atherosclerotic heart disease of native coronary artery without angina pectoris: Secondary | ICD-10-CM | POA: Diagnosis not present

## 2016-05-13 DIAGNOSIS — I10 Essential (primary) hypertension: Secondary | ICD-10-CM | POA: Diagnosis not present

## 2016-05-13 DIAGNOSIS — E785 Hyperlipidemia, unspecified: Secondary | ICD-10-CM | POA: Diagnosis not present

## 2016-05-13 LAB — LIPID PANEL
CHOL/HDL RATIO: 1.9 ratio (ref <=5.0)
Cholesterol: 136 mg/dL (ref 125–200)
HDL: 71 mg/dL (ref >=40)
LDL CALC: 49 mg/dL (ref <130)
TRIGLYCERIDES: 82 mg/dL (ref <150)
VLDL: 16 mg/dL (ref <30)

## 2016-05-13 LAB — COMPREHENSIVE METABOLIC PANEL
ALT: 13 U/L (ref 9–46)
AST: 20 U/L (ref 10–35)
Albumin: 4.3 g/dL (ref 3.6–5.1)
Alkaline Phosphatase: 50 U/L (ref 40–115)
BUN: 24 mg/dL (ref 7–25)
CHLORIDE: 102 mmol/L (ref 98–110)
CO2: 26 mmol/L (ref 20–31)
CREATININE: 1.13 mg/dL — AB (ref 0.70–1.11)
Calcium: 9.1 mg/dL (ref 8.6–10.3)
Glucose, Bld: 111 mg/dL — ABNORMAL HIGH (ref 65–99)
POTASSIUM: 4.2 mmol/L (ref 3.5–5.3)
SODIUM: 138 mmol/L (ref 135–146)
Total Bilirubin: 0.7 mg/dL (ref 0.2–1.2)
Total Protein: 6.5 g/dL (ref 6.1–8.1)

## 2016-05-13 NOTE — Patient Instructions (Signed)
Medication Instructions:  Your physician recommends that you continue on your current medications as directed. Please refer to the Current Medication list given to you today.   Labwork: TODAY - complete metabolic panel, cholesterol   Testing/Procedures: None Ordered   Follow-Up: Your physician wants you to follow-up in: 6 months with Dr. Nahser.  You will receive a reminder letter in the mail two months in advance. If you don't receive a letter, please call our office to schedule the follow-up appointment.   If you need a refill on your cardiac medications before your next appointment, please call your pharmacy.   Thank you for choosing [facility]! Michelle Swinyer, RN [phone]    

## 2016-05-13 NOTE — Progress Notes (Signed)
Cardiology Office Note   Date:  05/13/2016   ID:  JORREL CRIPE, DOB [birth year], MRN [id number]  PCP:  Irven Shelling, MD  Cardiologist:   [name], MD   Chief Complaint  Patient presents with  . Coronary Artery Disease   1. CAD, s/p LAD stent 2009 2. HTN 3. Diabetes Mellitus 4. Hyperlipidemia   History of Present Illness:  Todd Armstrong is doing well from a cardiac standpoint. He has not had any episodes of chest pain or shortness breath. [name] been exercising on a regular basis. He had a nice garden this year.   Apr 26, 2013:  Todd Armstrong is doing well. No CP , no dyspnea. He has put in his garden this year.   Nov. 12, 2014:  Apr 24, 2014:  Todd Armstrong is doing well. No CP , no dyspnea.  hes getting his garden going.   Nov. 9, 2015:  Todd Armstrong is doing well. His BP was elevated at his last visit. We increased his Lisinopril/HCTZ and his Bp is much better.    May 15, 2015:   Todd Armstrong is a 82 y.o. male who presents for his CAD Doing well.   No CP or dyspnea.  Still caring for his wife who is ill.  Still very busy working out in the garden   Nov. 29, 2016:  Doing well.  No CP or dyspnea.  Still very busy taking care of his wife, [name]  She is having lots of bleeding from her bowels.   May 13, 2016:  Todd Armstrong is doing well.  Still taking care of [name]  - she has more bad days than good days .  Enjoys his garden  BP and HR are very good No CP or dyspnea  Past Medical History  Diagnosis Date  . Acute myocardial infarction   . Dyslipidemia   . Hyperlipemia   . Hypertension   . Coronary artery disease     PTCA and stenting of his right coronary artery. 3.5 x 16-mm Liberte stent.                Past Surgical History  Procedure Laterality Date  . Hernia repair  1984  . Cardiac catheterization  11/05/2008  . Cardiac catheterization  10/22/1993    EF 60%     Current Outpatient Prescriptions  Medication Sig Dispense Refill    . aspirin 81 MG tablet Take 1 tablet (81 mg total) by mouth daily.    [name] atenolol (TENORMIN) 50 MG tablet TAKE ONE TABLET BY MOUTH ONCE DAILY 90 tablet 3  . glimepiride (AMARYL) 1 MG tablet Take 0.5 mg by mouth daily with breakfast.     . lisinopril-hydrochlorothiazide (PRINZIDE,ZESTORETIC) 20-25 MG tablet TAKE ONE TABLET BY MOUTH ONCE DAILY 90 tablet 0  . metFORMIN (GLUCOPHAGE) 1000 MG tablet Take 1,000 mg by mouth 2 (two) times daily with a meal.      . nitroGLYCERIN (NITROSTAT) 0.4 MG SL tablet Place 0.4 mg under the tongue every 5 (five) minutes as needed.      . rosuvastatin (CRESTOR) 10 MG tablet Take 1 tablet (10 mg total) by mouth daily. 90 tablet 2   No current facility-administered medications for this visit.    Allergies:   Review of patient's allergies indicates no known allergies.    Social History:  The patient  reports that he has quit smoking. He does not have any smokeless tobacco history on file. He reports that he does not drink alcohol or use illicit  drugs.   Family History:  The patient's family history includes Heart attack in his father.    ROS:  Please see the history of present illness.    Review of Systems: Constitutional:  denies fever, chills, diaphoresis, appetite change and fatigue.  HEENT: denies photophobia, eye pain, redness, hearing loss, ear pain, congestion, sore throat, rhinorrhea, sneezing, neck pain, neck stiffness and tinnitus.  Respiratory: denies SOB, DOE, cough, chest tightness, and wheezing.  Cardiovascular: denies chest pain, palpitations and leg swelling.  Gastrointestinal: denies nausea, vomiting, abdominal pain, diarrhea, constipation, blood in stool.  Genitourinary: denies dysuria, urgency, frequency, hematuria, flank pain and difficulty urinating.  Musculoskeletal: denies  myalgias, back pain, joint swelling, arthralgias and gait problem.   Skin: denies pallor, rash and wound.  Neurological: denies dizziness, seizures, syncope,  weakness, light-headedness, numbness and headaches.   Hematological: denies adenopathy, easy bruising, personal or family bleeding history.  Psychiatric/ Behavioral: denies suicidal ideation, mood changes, confusion, nervousness, sleep disturbance and agitation.      All other systems are reviewed and negative.   PHYSICAL EXAM: VS:  BP 110/52 mmHg  Pulse 55  Ht 5\' 10"  (1.778 m)  Wt 162 lb 6.4 oz (73.664 kg)  BMI 23.30 kg/m2 , BMI Body mass index is 23.3 kg/(m^2). GEN: Well nourished, well developed, in no acute distress HEENT: normal Neck: no JVD, carotid bruits, or masses Cardiac: RRR; no murmurs, rubs, or gallops,no edema  Respiratory:  clear to auscultation bilaterally, normal work of breathing GI: soft, nontender, nondistended, + BS MS: no deformity or atrophy Skin: warm and dry, no rash Neuro:  Strength and sensation are intact Psych: normal   EKG:  EKG is ordered today. Sinus rady at 55.   Otherwise normal eCG    Recent Labs: No results found for requested labs within last 365 days.    Lipid Panel    Component Value Date/Time   CHOL 129 05/15/2015 1152   TRIG 78.0 05/15/2015 1152   HDL 58.80 05/15/2015 1152   CHOLHDL 2 05/15/2015 1152   VLDL 15.6 05/15/2015 1152   LDLCALC 55 05/15/2015 1152      Wt Readings from Last 3 Encounters:  05/13/16 162 lb 6.4 oz (73.664 kg)  11/11/15 166 lb 6.4 oz (75.479 kg)  05/15/15 166 lb (75.297 kg)      Other studies Reviewed: Additional studies/ records that were reviewed today include: . Review of the above records demonstrates:    ASSESSMENT AND PLAN:  1. CAD, s/p LAD stent 2009 - he's doing very well. [name] not having any episodes of chest pain.  2. HTN - blood pressure is well-controlled. Continue current medications.  3. Diabetes Mellitus 4. Hyperlipidemia - will check lipids today     Current medicines are reviewed at length with the patient today.  The patient does not have concerns regarding  medicines.  The following changes have been made:  no change  Labs/ tests ordered today include:  No orders of the defined types were placed in this encounter.     Disposition:   FU with me in 6 months      [name], MD  05/13/2016 11:46 AM    [facility], [place], [place]  [address] Phone: [phone]; Fax: [phone]   [facility]  [address] [place], [place]  [address] [phone]    Fax [phone]

## 2016-05-25 ENCOUNTER — Other Ambulatory Visit: Payer: Self-pay | Admitting: Cardiovascular Disease

## 2016-06-01 DIAGNOSIS — L308 Other specified dermatitis: Secondary | ICD-10-CM | POA: Diagnosis not present

## 2016-06-01 DIAGNOSIS — D1801 Hemangioma of skin and subcutaneous tissue: Secondary | ICD-10-CM | POA: Diagnosis not present

## 2016-06-01 DIAGNOSIS — L812 Freckles: Secondary | ICD-10-CM | POA: Diagnosis not present

## 2016-06-01 DIAGNOSIS — L821 Other seborrheic keratosis: Secondary | ICD-10-CM | POA: Diagnosis not present

## 2016-06-01 DIAGNOSIS — C44319 Basal cell carcinoma of skin of other parts of face: Secondary | ICD-10-CM | POA: Diagnosis not present

## 2016-06-01 DIAGNOSIS — L570 Actinic keratosis: Secondary | ICD-10-CM | POA: Diagnosis not present

## 2016-06-01 DIAGNOSIS — Z85828 Personal history of other malignant neoplasm of skin: Secondary | ICD-10-CM | POA: Diagnosis not present

## 2016-06-01 DIAGNOSIS — D485 Neoplasm of uncertain behavior of skin: Secondary | ICD-10-CM | POA: Diagnosis not present

## 2016-06-29 DIAGNOSIS — Z85828 Personal history of other malignant neoplasm of skin: Secondary | ICD-10-CM | POA: Diagnosis not present

## 2016-06-29 DIAGNOSIS — C44319 Basal cell carcinoma of skin of other parts of face: Secondary | ICD-10-CM | POA: Diagnosis not present

## 2016-07-07 DIAGNOSIS — E1351 Other specified diabetes mellitus with diabetic peripheral angiopathy without gangrene: Secondary | ICD-10-CM | POA: Diagnosis not present

## 2016-07-07 DIAGNOSIS — I70293 Other atherosclerosis of native arteries of extremities, bilateral legs: Secondary | ICD-10-CM | POA: Diagnosis not present

## 2016-07-07 DIAGNOSIS — L602 Onychogryphosis: Secondary | ICD-10-CM | POA: Diagnosis not present

## 2016-07-07 DIAGNOSIS — L84 Corns and callosities: Secondary | ICD-10-CM | POA: Diagnosis not present

## 2016-07-08 ENCOUNTER — Other Ambulatory Visit: Payer: Self-pay

## 2016-07-08 MED ORDER — ATENOLOL 50 MG PO TABS: 50.0000 mg | ORAL_TABLET | Freq: Every day | ORAL | 1 refills | Status: DC

## 2016-07-14 ENCOUNTER — Telehealth: Payer: Self-pay | Admitting: Cardiovascular Disease

## 2016-07-14 NOTE — Telephone Encounter (Signed)
New message    Patient calling the office for samples of medication:   1.  What medication and dosage are you requesting samples for? Atenolol 15mg   2.  Are you currently out of this medication? yes

## 2016-07-14 NOTE — Telephone Encounter (Signed)
Calling stating [facility] is out of Atenolol and has a back order until end of August.  He is taking Atenolol 50 mg daily.  He is wondering what other medication that he could substitute for Atenolol.  Advised will forward message to Dr. Acie Fredrickson for recommendation.

## 2016-07-15 NOTE — Telephone Encounter (Signed)
He can change to metoprolol XL 50 mg a day

## 2016-07-16 MED ORDER — METOPROLOL SUCCINATE ER 50 MG PO TB24: 50.0000 mg | ORAL_TABLET | Freq: Every day | ORAL | 11 refills | Status: DC

## 2016-07-16 NOTE — Telephone Encounter (Signed)
Per Dr. Acie Fredrickson he can switch to Metoprolol XL 50 mg daily.  Advised pt and will send Rx into [facility].  He is requesting a 30 day supply because he may change back to Atenolol due to the low cost.  Advised he would need to let us know if he does decide to change back.

## 2016-08-09 DIAGNOSIS — E119 Type 2 diabetes mellitus without complications: Secondary | ICD-10-CM | POA: Diagnosis not present

## 2016-08-09 DIAGNOSIS — Z7984 Long term (current) use of oral hypoglycemic drugs: Secondary | ICD-10-CM | POA: Diagnosis not present

## 2016-08-09 DIAGNOSIS — I10 Essential (primary) hypertension: Secondary | ICD-10-CM | POA: Diagnosis not present

## 2016-09-21 DIAGNOSIS — Z23 Encounter for immunization: Secondary | ICD-10-CM | POA: Diagnosis not present

## 2016-10-01 DIAGNOSIS — L821 Other seborrheic keratosis: Secondary | ICD-10-CM | POA: Diagnosis not present

## 2016-10-01 DIAGNOSIS — D485 Neoplasm of uncertain behavior of skin: Secondary | ICD-10-CM | POA: Diagnosis not present

## 2016-10-01 DIAGNOSIS — Z85828 Personal history of other malignant neoplasm of skin: Secondary | ICD-10-CM | POA: Diagnosis not present

## 2016-10-01 DIAGNOSIS — D1801 Hemangioma of skin and subcutaneous tissue: Secondary | ICD-10-CM | POA: Diagnosis not present

## 2016-10-01 DIAGNOSIS — D0461 Carcinoma in situ of skin of right upper limb, including shoulder: Secondary | ICD-10-CM | POA: Diagnosis not present

## 2016-10-01 DIAGNOSIS — L570 Actinic keratosis: Secondary | ICD-10-CM | POA: Diagnosis not present

## 2016-10-01 DIAGNOSIS — D044 Carcinoma in situ of skin of scalp and neck: Secondary | ICD-10-CM | POA: Diagnosis not present

## 2016-10-05 ENCOUNTER — Encounter: Payer: Self-pay | Admitting: Cardiovascular Disease

## 2016-10-06 DIAGNOSIS — E1151 Type 2 diabetes mellitus with diabetic peripheral angiopathy without gangrene: Secondary | ICD-10-CM | POA: Diagnosis not present

## 2016-10-06 DIAGNOSIS — L602 Onychogryphosis: Secondary | ICD-10-CM | POA: Diagnosis not present

## 2016-10-06 DIAGNOSIS — L84 Corns and callosities: Secondary | ICD-10-CM | POA: Diagnosis not present

## 2016-10-15 ENCOUNTER — Encounter: Payer: Self-pay | Admitting: Cardiovascular Disease

## 2016-10-15 ENCOUNTER — Ambulatory Visit (INDEPENDENT_AMBULATORY_CARE_PROVIDER_SITE_OTHER): Payer: Medicare Other | Admitting: Cardiovascular Disease

## 2016-10-15 VITALS — BP 140/60 | HR 49 | Ht 70.0 in | Wt 165.0 lb

## 2016-10-15 DIAGNOSIS — R0989 Other specified symptoms and signs involving the circulatory and respiratory systems: Secondary | ICD-10-CM | POA: Insufficient documentation

## 2016-10-15 DIAGNOSIS — I2510 Atherosclerotic heart disease of native coronary artery without angina pectoris: Secondary | ICD-10-CM | POA: Diagnosis not present

## 2016-10-15 LAB — COMPREHENSIVE METABOLIC PANEL
ALT: 14 U/L (ref 9–46)
AST: 19 U/L (ref 10–35)
Albumin: 4.1 g/dL (ref 3.6–5.1)
Alkaline Phosphatase: 46 U/L (ref 40–115)
BILIRUBIN TOTAL: 0.4 mg/dL (ref 0.2–1.2)
BUN: 20 mg/dL (ref 7–25)
CO2: 27 mmol/L (ref 20–31)
CREATININE: 1.15 mg/dL — AB (ref 0.70–1.11)
Calcium: 9.4 mg/dL (ref 8.6–10.3)
Chloride: 102 mmol/L (ref 98–110)
Glucose, Bld: 75 mg/dL (ref 65–99)
Potassium: 4.3 mmol/L (ref 3.5–5.3)
SODIUM: 139 mmol/L (ref 135–146)
TOTAL PROTEIN: 6.5 g/dL (ref 6.1–8.1)

## 2016-10-15 LAB — LIPID PANEL
Cholesterol: 142 mg/dL (ref 125–200)
HDL: 63 mg/dL (ref >=40)
LDL CALC: 45 mg/dL (ref <130)
Total CHOL/HDL Ratio: 2.3 Ratio (ref <=5.0)
Triglycerides: 170 mg/dL — ABNORMAL HIGH (ref <150)
VLDL: 34 mg/dL — ABNORMAL HIGH (ref <30)

## 2016-10-15 MED ORDER — CARVEDILOL 12.5 MG PO TABS: 12.5000 mg | ORAL_TABLET | Freq: Two times a day (BID) | ORAL | 11 refills | Status: DC

## 2016-10-15 NOTE — Progress Notes (Signed)
Cardiology Office Note   Date:  10/15/2016   ID:  Todd Armstrong, DOB September 25, 1931, MRN [id number]  PCP:  Irven Shelling, MD  Cardiologist:   [name], MD   Chief Complaint  Patient presents with  . Coronary Artery Disease   1. CAD, s/p LAD stent 2009 2. HTN 3. Diabetes Mellitus 4. Hyperlipidemia    Todd Armstrong is doing well from a cardiac standpoint. He has not had any episodes of chest pain or shortness breath. [name] been exercising on a regular basis. He had a nice garden this year.   Apr 26, 2013:  Todd Armstrong is doing well. No CP , no dyspnea. He has put in his garden this year.   Nov. 12, 2014:  Apr 24, 2014:  Todd Armstrong is doing well. No CP , no dyspnea.  hes getting his garden going.   Nov. 9, 2015:  Todd Armstrong is doing well. His BP was elevated at his last visit. We increased his Lisinopril/HCTZ and his Bp is much better.    May 15, 2015:   Todd Armstrong is a 82 y.o. male who presents for his CAD Doing well.   No CP or dyspnea.  Still caring for his wife who is ill.  Still very busy working out in the garden   Nov. 29, 2016:  Doing well.  No CP or dyspnea.  Still very busy taking care of his wife, Todd Armstrong  She is having lots of bleeding from her bowels.   May 13, 2016:  Todd Armstrong is doing well.  Still taking care of Todd Armstrong  - she has more bad days than good days .  Enjoys his garden  BP and HR are very good No CP or dyspnea  Nov. 3, 2017:  Doing well.   Worked in his garden this past summer .  BP is well controlled.  HR is slow,  But no syncopal episodes  Having more challenges with wife , Todd Armstrong now.   She seems to have more dementia .  Past Medical History:  Diagnosis Date  . Acute myocardial infarction   . Coronary artery disease    PTCA and stenting of his right coronary artery. 3.5 x 16-mm Liberte stent.              . Dyslipidemia   . Hyperlipemia   . Hypertension     Past Surgical History:  Procedure  Laterality Date  . CARDIAC CATHETERIZATION  11/05/2008  . CARDIAC CATHETERIZATION  10/22/1993   EF 60%  . HERNIA REPAIR  1984     Current Outpatient Prescriptions  Medication Sig Dispense Refill  . aspirin 81 MG tablet Take 1 tablet (81 mg total) by mouth daily.    [name] atenolol (TENORMIN) 25 MG tablet Take 25 mg by mouth 2 (two) times daily.    [name] glimepiride (AMARYL) 1 MG tablet Take 0.5 mg by mouth daily with breakfast.     . lisinopril-hydrochlorothiazide (PRINZIDE,ZESTORETIC) 20-25 MG tablet TAKE ONE TABLET BY MOUTH ONCE DAILY 90 tablet 3  . metFORMIN (GLUCOPHAGE) 1000 MG tablet Take 1,000 mg by mouth 2 (two) times daily with a meal.      . nitroGLYCERIN (NITROSTAT) 0.4 MG SL tablet Place 0.4 mg under the tongue every 5 (five) minutes as needed.      . rosuvastatin (CRESTOR) 10 MG tablet Take 1 tablet (10 mg total) by mouth daily. 90 tablet 2   No current facility-administered medications for this visit.     Allergies:  Review of patient's allergies indicates no known allergies.    Social History:  The patient  reports that he has quit smoking. He has never used smokeless tobacco. He reports that he does not drink alcohol or use drugs.   Family History:  The patient's family history includes Heart attack in his father.    ROS:  Please see the history of present illness.    Review of Systems: Constitutional:  denies fever, chills, diaphoresis, appetite change and fatigue.  HEENT: denies photophobia, eye pain, redness, hearing loss, ear pain, congestion, sore throat, rhinorrhea, sneezing, neck pain, neck stiffness and tinnitus.  Respiratory: denies SOB, DOE, cough, chest tightness, and wheezing.  Cardiovascular: denies chest pain, palpitations and leg swelling.  Gastrointestinal: denies nausea, vomiting, abdominal pain, diarrhea, constipation, blood in stool.  Genitourinary: denies dysuria, urgency, frequency, hematuria, flank pain and difficulty urinating.  Musculoskeletal:  denies  myalgias, back pain, joint swelling, arthralgias and gait problem.   Skin: denies pallor, rash and wound.  Neurological: denies dizziness, seizures, syncope, weakness, light-headedness, numbness and headaches.   Hematological: denies adenopathy, easy bruising, personal or family bleeding history.  Psychiatric/ Behavioral: denies suicidal ideation, mood changes, confusion, nervousness, sleep disturbance and agitation.      All other systems are reviewed and negative.   PHYSICAL EXAM: VS:  BP 140/60   Pulse (!) 49   Ht 5\' 10"  (1.778 m)   Wt 165 lb (74.8 kg)   BMI 23.68 kg/m  , BMI Body mass index is 23.68 kg/m. GEN: Well nourished, well developed, in no acute distress  HEENT: normal  Neck: no JVD, soft bilateral carotid bruits,   Cardiac: RRR; no murmurs, rubs, or gallops,no edema  Respiratory:  clear to auscultation bilaterally, normal work of breathing GI: soft, nontender, nondistended, + BS MS: no deformity or atrophy  Skin: warm and dry, no rash Neuro:  Strength and sensation are intact Psych: normal   EKG:  EKG is ordered today. Sinus rady at 55.   Otherwise normal eCG    Recent Labs: 05/13/2016: ALT 13; BUN 24; Creat 1.13; Potassium 4.2; Sodium 138    Lipid Panel    Component Value Date/Time   CHOL 136 05/13/2016 1200   TRIG 82 05/13/2016 1200   HDL 71 05/13/2016 1200   CHOLHDL 1.9 05/13/2016 1200   VLDL 16 05/13/2016 1200   LDLCALC 49 05/13/2016 1200      Wt Readings from Last 3 Encounters:  10/15/16 165 lb (74.8 kg)  05/13/16 162 lb 6.4 oz (73.7 kg)  11/11/15 166 lb 6.4 oz (75.5 kg)      Other studies Reviewed: Additional studies/ records that were reviewed today include: . Review of the above records demonstrates:    ASSESSMENT AND PLAN:  1. CAD, s/p LAD stent 2009 - he's doing very well. [name] not having any episodes of chest pain.  2. HTN - blood pressure is well-controlled. Continue current medications.  3. Diabetes Mellitus 4.  Hyperlipidemia - will check lipids today    5. Carotid bruits:   Will get carotid duplex scan .     Current medicines are reviewed at length with the patient today.  The patient does not have concerns regarding medicines.  The following changes have been made:  no change  Labs/ tests ordered today include:  No orders of the defined types were placed in this encounter.    Disposition:   FU with me in 6 months      [name], MD  10/15/2016  11:48 AM    [facility] [place], [place], [place]  [address] Phone: [phone]; Fax: [phone]   [facility]  [address] [place], [place]  [address] [phone]    Fax [phone]

## 2016-10-15 NOTE — Patient Instructions (Signed)
Medication Instructions:  STOP Atenolol (Tenormin) START Carvedilol (Coreg) 12.5 mg twice daily   Labwork: TODAY - cholesterol, complete metabolic panel   Testing/Procedures: Your physician has requested that you have a carotid duplex. This test is an ultrasound of the carotid arteries in your neck. It looks at blood flow through these arteries that supply the brain with blood. Allow one hour for this exam. There are no restrictions or special instructions.   Follow-Up: Your physician wants you to follow-up in: 6 months with Dr. Acie Fredrickson.  You will receive a reminder letter in the mail two months in advance. If you don't receive a letter, please call our office to schedule the follow-up appointment.   If you need a refill on your cardiac medications before your next appointment, please call your pharmacy.   Thank you for choosing [facility]! Christen Bame, RN [phone]

## 2016-10-18 ENCOUNTER — Telehealth: Payer: Self-pay | Admitting: Cardiovascular Disease

## 2016-10-18 NOTE — Telephone Encounter (Signed)
New Message:    Pt says his insurance will not pay for him to have his Doppler at [facility]. They will pay for it,if he have it at [facility].

## 2016-10-18 NOTE — Telephone Encounter (Signed)
Spoke with patient and reviewed this information with him    Charmaine M Hall  Michelle M Swinyer, RN        Pt has Medicare and [employer] MCR supplement. Pt should be able to have test anywhere. No precert required.   Previous Messages    ----- Message -----  From: Emmaline Life, RN  Sent: 10/18/2016  9:34 AM  To: Rebeca Alert [address] Pre Cert/Auth   Hey ladies,   Patient has called and left message that insurance has denied carotid to be done at [facility] but will approve for it to be done at [facility]. Can someone verify this information for me before I call him?   Thank you,  Sharyn Lull      He states he called [employer] and was told he would need to have the test at [facility] or [place].  I asked if he checked with Medicare which he has not.  I advised he call them to verify that test will be covered.  His appointment at [facility] for tomorrow was cancelled when he was called with the reminder and told them the situation.  I advised he call Medicare and call me back.

## 2016-10-18 NOTE — Telephone Encounter (Signed)
Spoke with patient who states he called Medicare and was told his test would be covered at any outpatient facility.  He asked to be rescheduled for tomorrow at 2:30 at [facility].  Advised patient that I rescheduled him and left a voicemail for Jonette Mate, the vascular scheduler asking her to call him to reschedule if this date and time does not work.  He verbalized understanding and agreement and thanked me for the call.

## 2016-10-19 ENCOUNTER — Inpatient Hospital Stay (HOSPITAL_COMMUNITY): Admission: RE | Admit: 2016-10-19 | Payer: Medicare Other | Source: Ambulatory Visit

## 2016-10-19 ENCOUNTER — Ambulatory Visit (HOSPITAL_COMMUNITY)
Admission: RE | Admit: 2016-10-19 | Discharge: 2016-10-19 | Disposition: A | Discharge status: Home / Self Care | Payer: Medicare Other | Source: Ambulatory Visit | Attending: Cardiovascular Disease | Admitting: Cardiovascular Disease

## 2016-10-19 DIAGNOSIS — I2510 Atherosclerotic heart disease of native coronary artery without angina pectoris: Secondary | ICD-10-CM

## 2016-10-19 DIAGNOSIS — R0989 Other specified symptoms and signs involving the circulatory and respiratory systems: Secondary | ICD-10-CM

## 2016-10-19 DIAGNOSIS — I6523 Occlusion and stenosis of bilateral carotid arteries: Secondary | ICD-10-CM | POA: Diagnosis not present

## 2016-11-12 DIAGNOSIS — J209 Acute bronchitis, unspecified: Secondary | ICD-10-CM | POA: Diagnosis not present

## 2016-12-14 ENCOUNTER — Other Ambulatory Visit: Payer: Self-pay | Admitting: Cardiovascular Disease

## 2017-01-07 DIAGNOSIS — L602 Onychogryphosis: Secondary | ICD-10-CM | POA: Diagnosis not present

## 2017-01-07 DIAGNOSIS — E1351 Other specified diabetes mellitus with diabetic peripheral angiopathy without gangrene: Secondary | ICD-10-CM | POA: Diagnosis not present

## 2017-02-07 DIAGNOSIS — L570 Actinic keratosis: Secondary | ICD-10-CM | POA: Diagnosis not present

## 2017-02-07 DIAGNOSIS — L853 Xerosis cutis: Secondary | ICD-10-CM | POA: Diagnosis not present

## 2017-02-07 DIAGNOSIS — L821 Other seborrheic keratosis: Secondary | ICD-10-CM | POA: Diagnosis not present

## 2017-02-07 DIAGNOSIS — Z85828 Personal history of other malignant neoplasm of skin: Secondary | ICD-10-CM | POA: Diagnosis not present

## 2017-02-07 DIAGNOSIS — L812 Freckles: Secondary | ICD-10-CM | POA: Diagnosis not present

## 2017-02-07 DIAGNOSIS — D1801 Hemangioma of skin and subcutaneous tissue: Secondary | ICD-10-CM | POA: Diagnosis not present

## 2017-02-08 DIAGNOSIS — G8929 Other chronic pain: Secondary | ICD-10-CM | POA: Diagnosis not present

## 2017-02-08 DIAGNOSIS — E7800 Pure hypercholesterolemia, unspecified: Secondary | ICD-10-CM | POA: Diagnosis not present

## 2017-02-08 DIAGNOSIS — I10 Essential (primary) hypertension: Secondary | ICD-10-CM | POA: Diagnosis not present

## 2017-02-08 DIAGNOSIS — E119 Type 2 diabetes mellitus without complications: Secondary | ICD-10-CM | POA: Diagnosis not present

## 2017-02-08 DIAGNOSIS — Z1389 Encounter for screening for other disorder: Secondary | ICD-10-CM | POA: Diagnosis not present

## 2017-02-08 DIAGNOSIS — Z0000 Encounter for general adult medical examination without abnormal findings: Secondary | ICD-10-CM | POA: Diagnosis not present

## 2017-02-08 DIAGNOSIS — M25511 Pain in right shoulder: Secondary | ICD-10-CM | POA: Diagnosis not present

## 2017-02-08 DIAGNOSIS — M21961 Unspecified acquired deformity of right lower leg: Secondary | ICD-10-CM | POA: Diagnosis not present

## 2017-02-08 DIAGNOSIS — Z7984 Long term (current) use of oral hypoglycemic drugs: Secondary | ICD-10-CM | POA: Diagnosis not present

## 2017-02-23 DIAGNOSIS — M25511 Pain in right shoulder: Secondary | ICD-10-CM | POA: Diagnosis not present

## 2017-02-23 DIAGNOSIS — M25561 Pain in right knee: Secondary | ICD-10-CM | POA: Diagnosis not present

## 2017-03-07 DIAGNOSIS — M1711 Unilateral primary osteoarthritis, right knee: Secondary | ICD-10-CM | POA: Diagnosis not present

## 2017-03-16 DIAGNOSIS — L602 Onychogryphosis: Secondary | ICD-10-CM | POA: Diagnosis not present

## 2017-03-16 DIAGNOSIS — L84 Corns and callosities: Secondary | ICD-10-CM | POA: Diagnosis not present

## 2017-03-16 DIAGNOSIS — E1351 Other specified diabetes mellitus with diabetic peripheral angiopathy without gangrene: Secondary | ICD-10-CM | POA: Diagnosis not present

## 2017-04-04 DIAGNOSIS — M1711 Unilateral primary osteoarthritis, right knee: Secondary | ICD-10-CM | POA: Diagnosis not present

## 2017-04-26 DIAGNOSIS — L858 Other specified epidermal thickening: Secondary | ICD-10-CM | POA: Diagnosis not present

## 2017-04-26 DIAGNOSIS — L570 Actinic keratosis: Secondary | ICD-10-CM | POA: Diagnosis not present

## 2017-04-26 DIAGNOSIS — Z85828 Personal history of other malignant neoplasm of skin: Secondary | ICD-10-CM | POA: Diagnosis not present

## 2017-04-26 DIAGNOSIS — D485 Neoplasm of uncertain behavior of skin: Secondary | ICD-10-CM | POA: Diagnosis not present

## 2017-04-26 DIAGNOSIS — C44629 Squamous cell carcinoma of skin of left upper limb, including shoulder: Secondary | ICD-10-CM | POA: Diagnosis not present

## 2017-05-06 DIAGNOSIS — M67911 Unspecified disorder of synovium and tendon, right shoulder: Secondary | ICD-10-CM | POA: Diagnosis not present

## 2017-05-16 DIAGNOSIS — M25511 Pain in right shoulder: Secondary | ICD-10-CM | POA: Diagnosis not present

## 2017-05-18 ENCOUNTER — Other Ambulatory Visit: Payer: Self-pay | Admitting: Cardiovascular Disease

## 2017-05-18 MED ORDER — LISINOPRIL-HYDROCHLOROTHIAZIDE 20-25 MG PO TABS: 1.0000 | ORAL_TABLET | Freq: Every day | ORAL | 1 refills | Status: DC

## 2017-05-20 DIAGNOSIS — M67911 Unspecified disorder of synovium and tendon, right shoulder: Secondary | ICD-10-CM | POA: Diagnosis not present

## 2017-05-25 DIAGNOSIS — L602 Onychogryphosis: Secondary | ICD-10-CM | POA: Diagnosis not present

## 2017-05-25 DIAGNOSIS — L84 Corns and callosities: Secondary | ICD-10-CM | POA: Diagnosis not present

## 2017-05-25 DIAGNOSIS — E1351 Other specified diabetes mellitus with diabetic peripheral angiopathy without gangrene: Secondary | ICD-10-CM | POA: Diagnosis not present

## 2017-06-08 ENCOUNTER — Encounter (HOSPITAL_COMMUNITY): Payer: Self-pay | Admitting: Vascular Surgery

## 2017-06-08 NOTE — Progress Notes (Signed)
Pt denies SOB and chest pain. Pt under the care of Dr Acie Fredrickson, Cardiology. Pt denies having an EKG and chest x ray within the last year. Pt made aware to stop taking vitamins, fish oil and herbal medications. Do not take any NSAIDs ie: Ibuprofen, Motrin,  Advil, Naproxen ( Aleve) or any medication containing Aspirin. Pt made aware to not take Glimeperide tonight or DOS and no Metformin DOS. Pt made aware to check BG every 2 hours prior to arrival to hospital on DOS. Pt made aware to treat a BG < 70 with 4 ounces of apple or cranberry juice, wait 15 minutes after intervention to recheck BG, if BG remains < 70, call Short Stay unit to speak with a nurse. Pt verbalized understanding of all pre-op instructions.

## 2017-06-08 NOTE — Progress Notes (Signed)
Anesthesia Chart Review: SAME DAY WORK-UP. Patient was moved from [facility] (this morning) due to cardiopulmonary (moderate pulmonary hypertension on 2007 echo) history.  History includes former smoker, HTN, HLD, CAD/MI s/p RCA stent '09, moderate pulmonary hypertension (echo '07), hernia repair '84, right tib-fib IM nailing '05.   PCP is Dr. Lavone Orn. Cardiologist is Dr. [name], last visit 10/15/16. He was doing well at that time. Six month follow-up recommended. In regards to surgical clearance he wrote, "[name] is at low-moderate risk for his upcoming orthopedic surgery. He can have the surgery in the [place]."  Last EKG on 05/13/16 showed SB. He will need an updated EKG on arrival.   Cardiac cath 11/01/08:  LM: Minor luminal irregularities. LAD: Mild-moderate irregularities between 10-20%. The ramus intermediate vessel is a small vessel with minor luminal irregularities. LCX: Occluded proximally (old). The distal marginal vessel fills late via left to left collaterals. RCA: 40% proximal 40% stenosis, followed by mid 80-90% stenosis. PDA and PLA are normal.  EF 50% with lateral akinesis. The inferior wall contracts fairly normally. CONCLUSION:  Two-vessel coronary artery disease:  He has a known chronic  total occlusion of the left circumflex artery.  He has a new tight  stenosis of the right coronary artery.  We will schedule him for PCI of  the right coronary artery.  We will go and start loading him on Plavix. PCI 11/05/08: PTCA/Liberte stent X 2 RCA  Echo 07/19/06: Impression: Normal LV systolic function, EF 16-07%. There is impaired LV relaxation. Mild aortic sclerosis. Mild to moderate aortic insufficiency. Mild mitral regurgitation. Mild tricuspid regurgitation. Moderate pulmonary hypertension, RVSP 52 mmHg.  Carotid U/S 10/19/16: Impressions: Heterogeneous plaque, bilaterally. 60-79% RICA stenosis. 37-10% LICA stenosis. Greater than 50% RECA  stenosis. Normal subclavian arteries bilaterally. Patent vertebral arteries with antegrade flow. One year follow-up recommended.  He will need EKG and labs on arrival. Reviewed history and cardiac clearance with anesthesiologist Dr. Conrad [place]. Further evaluation on the day of surgery.  Todd Armstrong [facility] Short Stay Center/Anesthesiology Phone [phone] 06/08/2017 3:29 PM

## 2017-06-09 ENCOUNTER — Ambulatory Visit (HOSPITAL_COMMUNITY): Payer: Medicare Other | Admitting: Vascular Surgery

## 2017-06-09 ENCOUNTER — Ambulatory Visit (HOSPITAL_COMMUNITY)
Admission: RE | Admit: 2017-06-09 | Discharge: 2017-06-09 | Disposition: A | Discharge status: Home / Self Care | Payer: Medicare Other | Source: Ambulatory Visit | Attending: Orthopaedic Surgery | Admitting: Orthopaedic Surgery

## 2017-06-09 ENCOUNTER — Encounter (HOSPITAL_COMMUNITY): Payer: Self-pay | Admitting: Orthopedic Surgery

## 2017-06-09 ENCOUNTER — Encounter (HOSPITAL_COMMUNITY)
Admission: RE | Disposition: A | Discharge status: Home / Self Care | Payer: Self-pay | Source: Ambulatory Visit | Attending: Orthopaedic Surgery

## 2017-06-09 DIAGNOSIS — I252 Old myocardial infarction: Secondary | ICD-10-CM | POA: Insufficient documentation

## 2017-06-09 DIAGNOSIS — I351 Nonrheumatic aortic (valve) insufficiency: Secondary | ICD-10-CM | POA: Insufficient documentation

## 2017-06-09 DIAGNOSIS — Z79899 Other long term (current) drug therapy: Secondary | ICD-10-CM | POA: Insufficient documentation

## 2017-06-09 DIAGNOSIS — Z8249 Family history of ischemic heart disease and other diseases of the circulatory system: Secondary | ICD-10-CM | POA: Diagnosis not present

## 2017-06-09 DIAGNOSIS — I25810 Atherosclerosis of coronary artery bypass graft(s) without angina pectoris: Secondary | ICD-10-CM | POA: Diagnosis not present

## 2017-06-09 DIAGNOSIS — I10 Essential (primary) hypertension: Secondary | ICD-10-CM | POA: Diagnosis not present

## 2017-06-09 DIAGNOSIS — M7541 Impingement syndrome of right shoulder: Secondary | ICD-10-CM | POA: Diagnosis not present

## 2017-06-09 DIAGNOSIS — E119 Type 2 diabetes mellitus without complications: Secondary | ICD-10-CM | POA: Diagnosis not present

## 2017-06-09 DIAGNOSIS — I2510 Atherosclerotic heart disease of native coronary artery without angina pectoris: Secondary | ICD-10-CM | POA: Diagnosis not present

## 2017-06-09 DIAGNOSIS — E785 Hyperlipidemia, unspecified: Secondary | ICD-10-CM | POA: Diagnosis not present

## 2017-06-09 DIAGNOSIS — Z955 Presence of coronary angioplasty implant and graft: Secondary | ICD-10-CM | POA: Insufficient documentation

## 2017-06-09 DIAGNOSIS — M75111 Incomplete rotator cuff tear or rupture of right shoulder, not specified as traumatic: Secondary | ICD-10-CM | POA: Insufficient documentation

## 2017-06-09 DIAGNOSIS — I272 Pulmonary hypertension, unspecified: Secondary | ICD-10-CM | POA: Insufficient documentation

## 2017-06-09 DIAGNOSIS — M75101 Unspecified rotator cuff tear or rupture of right shoulder, not specified as traumatic: Secondary | ICD-10-CM | POA: Diagnosis not present

## 2017-06-09 DIAGNOSIS — Z7984 Long term (current) use of oral hypoglycemic drugs: Secondary | ICD-10-CM | POA: Insufficient documentation

## 2017-06-09 DIAGNOSIS — Z9889 Other specified postprocedural states: Secondary | ICD-10-CM | POA: Diagnosis not present

## 2017-06-09 DIAGNOSIS — Z87891 Personal history of nicotine dependence: Secondary | ICD-10-CM | POA: Diagnosis not present

## 2017-06-09 DIAGNOSIS — G8918 Other acute postprocedural pain: Secondary | ICD-10-CM | POA: Diagnosis not present

## 2017-06-09 DIAGNOSIS — Z7982 Long term (current) use of aspirin: Secondary | ICD-10-CM | POA: Insufficient documentation

## 2017-06-09 DIAGNOSIS — I700 Atherosclerosis of aorta: Secondary | ICD-10-CM | POA: Insufficient documentation

## 2017-06-09 HISTORY — DX: Presence of dental prosthetic device (complete) (partial): Z97.2

## 2017-06-09 HISTORY — DX: Incomplete rotator cuff tear or rupture of unspecified shoulder, not specified as traumatic: M75.110

## 2017-06-09 HISTORY — PX: SHOULDER ARTHROSCOPY: SHX128

## 2017-06-09 HISTORY — DX: Pulmonary hypertension, unspecified: I27.20

## 2017-06-09 HISTORY — DX: Type 2 diabetes mellitus without complications: E11.9

## 2017-06-09 LAB — CBC
HCT: 38.9 % — ABNORMAL LOW (ref 39.0–52.0)
Hemoglobin: 12.9 g/dL — ABNORMAL LOW (ref 13.0–17.0)
MCH: 30.7 pg (ref 26.0–34.0)
MCHC: 33.2 g/dL (ref 30.0–36.0)
MCV: 92.6 fL (ref 78.0–100.0)
PLATELETS: 185 10*3/uL (ref 150–400)
RBC: 4.2 MIL/uL — ABNORMAL LOW (ref 4.22–5.81)
RDW: 13.9 % (ref 11.5–15.5)
WBC: 8 10*3/uL (ref 4.0–10.5)

## 2017-06-09 LAB — BASIC METABOLIC PANEL
Anion gap: 8 (ref 5–15)
BUN: 20 mg/dL (ref 6–20)
CALCIUM: 9.1 mg/dL (ref 8.9–10.3)
CO2: 25 mmol/L (ref 22–32)
CREATININE: 1.09 mg/dL (ref 0.61–1.24)
Chloride: 104 mmol/L (ref 101–111)
GFR calc Af Amer: >60 mL/min (ref >60)
GLUCOSE: 125 mg/dL — AB (ref 65–99)
POTASSIUM: 3.7 mmol/L (ref 3.5–5.1)
SODIUM: 137 mmol/L (ref 135–145)

## 2017-06-09 LAB — GLUCOSE, CAPILLARY
Glucose-Capillary: 132 mg/dL — ABNORMAL HIGH (ref 65–99)
Glucose-Capillary: 115 mg/dL — ABNORMAL HIGH (ref 65–99)

## 2017-06-09 SURGERY — ARTHROSCOPY, SHOULDER
Anesthesia: General | Site: Shoulder | Laterality: Right

## 2017-06-09 MED ORDER — PROPOFOL 10 MG/ML IV BOLUS
INTRAVENOUS | Status: AC
  Filled 2017-06-09: qty 20

## 2017-06-09 MED ORDER — FENTANYL CITRATE (PF) 100 MCG/2ML IJ SOLN: 50.0000 ug | Freq: Once | INTRAMUSCULAR | Status: DC

## 2017-06-09 MED ORDER — ROCURONIUM BROMIDE 100 MG/10ML IV SOLN
INTRAVENOUS | Status: DC | PRN
  Administered 2017-06-09: 50 mg via INTRAVENOUS

## 2017-06-09 MED ORDER — PROPOFOL 10 MG/ML IV BOLUS
INTRAVENOUS | Status: DC | PRN
  Administered 2017-06-09: 130 mg via INTRAVENOUS

## 2017-06-09 MED ORDER — OXYCODONE-ACETAMINOPHEN 5-325 MG PO TABS: 1.0000 | ORAL_TABLET | Freq: Four times a day (QID) | ORAL | 0 refills | Status: DC | PRN

## 2017-06-09 MED ORDER — FENTANYL CITRATE (PF) 100 MCG/2ML IJ SOLN
INTRAMUSCULAR | Status: DC | PRN
  Administered 2017-06-09: 50 ug via INTRAVENOUS

## 2017-06-09 MED ORDER — FENTANYL CITRATE (PF) 250 MCG/5ML IJ SOLN
INTRAMUSCULAR | Status: AC
  Filled 2017-06-09: qty 5

## 2017-06-09 MED ORDER — MIDAZOLAM HCL 2 MG/2ML IJ SOLN
INTRAMUSCULAR | Status: DC
Start: 2017-06-09 — End: 2017-06-09
  Filled 2017-06-09: qty 2

## 2017-06-09 MED ORDER — PROMETHAZINE HCL 25 MG/ML IJ SOLN: 6.2500 mg | INTRAMUSCULAR | Status: DC | PRN

## 2017-06-09 MED ORDER — CEFAZOLIN SODIUM-DEXTROSE 2-4 GM/100ML-% IV SOLN
2.0000 g | INTRAVENOUS | Status: AC
  Administered 2017-06-09: 2 g via INTRAVENOUS
  Filled 2017-06-09: qty 100

## 2017-06-09 MED ORDER — MEPERIDINE HCL 25 MG/ML IJ SOLN: 6.2500 mg | INTRAMUSCULAR | Status: DC | PRN

## 2017-06-09 MED ORDER — SODIUM CHLORIDE 0.9 % IR SOLN
Status: DC | PRN
  Administered 2017-06-09: 6000 mL

## 2017-06-09 MED ORDER — FENTANYL CITRATE (PF) 100 MCG/2ML IJ SOLN: 25.0000 ug | INTRAMUSCULAR | Status: DC | PRN

## 2017-06-09 MED ORDER — PHENYLEPHRINE HCL 10 MG/ML IJ SOLN
INTRAMUSCULAR | Status: DC | PRN
  Administered 2017-06-09 (×2): 80 ug via INTRAVENOUS
  Administered 2017-06-09: 40 ug via INTRAVENOUS

## 2017-06-09 MED ORDER — LIDOCAINE HCL (CARDIAC) 20 MG/ML IV SOLN
INTRAVENOUS | Status: DC | PRN
  Administered 2017-06-09: 50 mg via INTRAVENOUS

## 2017-06-09 MED ORDER — LACTATED RINGERS IV SOLN: INTRAVENOUS | Status: DC

## 2017-06-09 MED ORDER — ONDANSETRON HCL 4 MG/2ML IJ SOLN
INTRAMUSCULAR | Status: DC | PRN
  Administered 2017-06-09: 4 mg via INTRAVENOUS

## 2017-06-09 MED ORDER — SUGAMMADEX SODIUM 200 MG/2ML IV SOLN
INTRAVENOUS | Status: DC | PRN
  Administered 2017-06-09: 350 mg via INTRAVENOUS

## 2017-06-09 MED ORDER — FENTANYL CITRATE (PF) 100 MCG/2ML IJ SOLN
INTRAMUSCULAR | Status: AC
  Administered 2017-06-09: 50 ug
  Filled 2017-06-09: qty 2

## 2017-06-09 MED ORDER — PHENYLEPHRINE HCL 10 MG/ML IJ SOLN
INTRAMUSCULAR | Status: DC | PRN
  Administered 2017-06-09: 40 ug/min via INTRAVENOUS

## 2017-06-09 MED ORDER — CHLORHEXIDINE GLUCONATE 4 % EX LIQD: 60.0000 mL | Freq: Once | CUTANEOUS | Status: DC

## 2017-06-09 MED ORDER — LACTATED RINGERS IV SOLN
INTRAVENOUS | Status: DC
  Administered 2017-06-09 (×2): via INTRAVENOUS

## 2017-06-09 MED ORDER — BUPIVACAINE-EPINEPHRINE (PF) 0.5% -1:200000 IJ SOLN
INTRAMUSCULAR | Status: DC | PRN
  Administered 2017-06-09: 15 mL via PERINEURAL

## 2017-06-09 SURGICAL SUPPLY — 40 items
BLADE GREAT WHITE 4.2 (BLADE) ×2 IMPLANT
BLADE GREAT WHITE 4.2MM (BLADE) ×1
BUR VERTEX HOODED 4.5 (BURR) ×3 IMPLANT
CANNULA SHOULDER 7CM (CANNULA) ×3 IMPLANT
CANNULA TWIST IN 8.25X7CM (CANNULA) IMPLANT
COVER SURGICAL LIGHT HANDLE (MISCELLANEOUS) IMPLANT
DRAPE ORTHO SPLIT 77X108 STRL (DRAPES) ×6
DRAPE STERI 35X30 U-POUCH (DRAPES) ×3 IMPLANT
DRAPE SURG ORHT 6 SPLT 77X108 (DRAPES) ×2 IMPLANT
DRAPE U-SHAPE 47X51 STRL (DRAPES) ×3 IMPLANT
DRSG EMULSION OIL 3X3 NADH (GAUZE/BANDAGES/DRESSINGS) ×3 IMPLANT
DRSG PAD ABDOMINAL 8X10 ST (GAUZE/BANDAGES/DRESSINGS) ×3 IMPLANT
DURAPREP 26ML APPLICATOR (WOUND CARE) ×3 IMPLANT
GAUZE SPONGE 4X4 12PLY STRL (GAUZE/BANDAGES/DRESSINGS) ×3 IMPLANT
GLOVE BIO SURGEON STRL SZ8 (GLOVE) ×3 IMPLANT
GLOVE BIOGEL PI IND STRL 8 (GLOVE) ×2 IMPLANT
GLOVE BIOGEL PI INDICATOR 8 (GLOVE) ×4
GLOVE SS N UNI LF 8.0 STRL (GLOVE) ×3 IMPLANT
GOWN STRL REUS W/ TWL LRG LVL3 (GOWN DISPOSABLE) ×1 IMPLANT
GOWN STRL REUS W/ TWL XL LVL3 (GOWN DISPOSABLE) ×2 IMPLANT
GOWN STRL REUS W/TWL LRG LVL3 (GOWN DISPOSABLE) ×3
GOWN STRL REUS W/TWL XL LVL3 (GOWN DISPOSABLE) ×6
KIT BASIN OR (CUSTOM PROCEDURE TRAY) ×3 IMPLANT
KIT ROOM TURNOVER OR (KITS) ×3 IMPLANT
MANIFOLD NEPTUNE II (INSTRUMENTS) ×3 IMPLANT
NEEDLE 22X1 1/2 (OR ONLY) (NEEDLE) IMPLANT
NS IRRIG 1000ML POUR BTL (IV SOLUTION) ×3 IMPLANT
PACK ARTHROSCOPY DSU (CUSTOM PROCEDURE TRAY) ×3 IMPLANT
PAD ARMBOARD 7.5X6 YLW CONV (MISCELLANEOUS) ×6 IMPLANT
SET ARTHROSCOPY TUBING (MISCELLANEOUS) ×3
SET ARTHROSCOPY TUBING LN (MISCELLANEOUS) ×1 IMPLANT
SPONGE LAP 4X18 X RAY DECT (DISPOSABLE) ×3 IMPLANT
SUT ETHILON 3 0 PS 1 (SUTURE) ×3 IMPLANT
SYR CONTROL 10ML LL (SYRINGE) IMPLANT
TOWEL OR 17X24 6PK STRL BLUE (TOWEL DISPOSABLE) ×3 IMPLANT
TOWEL OR 17X26 10 PK STRL BLUE (TOWEL DISPOSABLE) ×3 IMPLANT
TUBE CONNECTING 12'X1/4 (SUCTIONS) ×1
TUBE CONNECTING 12X1/4 (SUCTIONS) ×2 IMPLANT
WAND HAND CNTRL MULTIVAC 90 (MISCELLANEOUS) ×3 IMPLANT
WATER STERILE IRR 1000ML POUR (IV SOLUTION) ×3 IMPLANT

## 2017-06-09 NOTE — H&P (Signed)
Melrose Nakayama, MD  Chauncey Cruel, PA-C  Loni Dolly, PA-C                                  [facility]                [address], [place], [place]  [address]   ORTHOPAEDIC CONSULTATION  Todd Armstrong            MRN:  [id number] DOB/SEX:  1931/08/06/male     CHIEF COMPLAINT:  Painful right shoulder  HISTORY: Todd Armstrong a 83 y.o. male with a long history of right shoulder pain.  This has persisted despite multiple conservative measures including two injections.  By MRI he has rotator cuff tears.  He has disabling pain and is offered arthroscopy.   PAST MEDICAL HISTORY: Patient Active Problem List   Diagnosis Date Noted  . Bilateral carotid bruits 10/15/2016  . Acute myocardial infarction   . Dyslipidemia   . Coronary artery disease   . Hypertension   . Hyperlipemia    Past Medical History:  Diagnosis Date  . Acute myocardial infarction   . Coronary artery disease    PTCA and stenting of his right coronary artery. 3.5 x 16-mm Liberte stent.              . Diabetes mellitus without complication ([place])   . Dyslipidemia   . Hyperlipemia   . Hypertension   . Partial tear of rotator cuff    right shoulder  . Pulmonary hypertension (HCC)    moderate pulmonary HTN by 2007 echo (RVSP 52 mmHg)  . Wears dentures    Past Surgical History:  Procedure Laterality Date  . CARDIAC CATHETERIZATION  11/05/2008  . CARDIAC CATHETERIZATION  10/22/1993   EF 60%  . coronary stents    . FRACTURE SURGERY     righ fibula and tibia  . HERNIA REPAIR  1984  . MULTIPLE TOOTH EXTRACTIONS       MEDICATIONS:   Current Facility-Administered Medications:  .  ceFAZolin (ANCEF) IVPB 2g/100 mL premix, 2 g, Intravenous, On Call to OR, Melrose Nakayama, MD .  chlorhexidine (HIBICLENS) 4 % liquid 4 application, 60 mL, Topical, Once, Melrose Nakayama, MD .  lactated ringers infusion, , Intravenous, Continuous, Melrose Nakayama, MD  ALLERGIES:   Allergies  Allergen Reactions   . No Known Allergies     REVIEW OF SYSTEMS: REVIEWED IN DETAIL IN CHART  FAMILY HISTORY:   Family History  Problem Relation Age of Onset  . Heart attack Father     SOCIAL HISTORY:   Social History  Substance Use Topics  . Smoking status: Former Smoker    Types: Cigarettes  . Smokeless tobacco: Never Used     Comment: quit smoking cigarettes > 40 years ago  . Alcohol use No     EXAMINATION: Vital signs in last 24 hours: Temp:  [98.1 F (36.7 C)] 98.1 F (36.7 C) (06/28 1313) Pulse Rate:  [70] 70 (06/28 1313) Resp:  [18] 18 (06/28 1313) BP: (145)/(58) 145/58 (06/28 1313) SpO2:  [98 %] 98 % (06/28 1313) Weight:  [81.6 kg (180 lb)] 81.6 kg (180 lb) (06/28 1313)  BP (!) 145/58   Pulse 70   Temp 98.1 F (36.7 C) (Oral)   Resp 18   Wt 81.6 kg (180 lb)   SpO2 98%   BMI 25.83 kg/m   General Appearance:  Alert, cooperative, no distress, appears stated age  Head:    Normocephalic, without obvious abnormality, atraumatic  Eyes:    PERRL, conjunctiva/corneas clear, EOM's intact, fundi    benign, both eyes       Ears:    Normal TM's and external ear canals, both ears  Nose:   Nares normal, septum midline, mucosa normal, no drainage    or sinus tenderness  Throat:   Lips, mucosa, and tongue normal; teeth and gums normal  Neck:   Supple, symmetrical, trachea midline, no adenopathy;       thyroid:  No enlargement/tenderness/nodules; no carotid   bruit or JVD  Back:     Symmetric, no curvature, ROM normal, no CVA tenderness  Lungs:     Clear to auscultation bilaterally, respirations unlabored  Chest wall:    No tenderness or deformity  Heart:    Regular rate and rhythm, S1 and S2 normal  Abdomen:     Soft, non-tender, bowel sounds active all four quadrants,    no masses, no organomegaly  Genitalia:    Rectal:    Extremities:   Extremities normal, atraumatic, no cyanosis or edema  Pulses:   2+ and symmetric all extremities  Skin:   Skin color, texture, turgor normal, no  rashes or lesions  Lymph nodes:   Cervical, supraclavicular, and axillary nodes normal  Neurologic:   CNII-XII intact. Normal strength, sensation and reflexes      throughout    Musculoskeletal Exam:   Right shoulder with very painful arc   DIAGNOSTIC STUDIES: Recent laboratory studies: No results for input(s): WBC, HGB, HCT, PLT in the last 168 hours. No results for input(s): NA, K, CL, CO2, BUN, CREATININE, GLUCOSE, CALCIUM in the last 168 hours. No results found for: INR, PROTIME   Recent Radiographic Studies :  No results found.  ASSESSMENT:  Right shoulder rotator cuff tear   PLAN:  We plan on arthroscopy in main OR due to cardiac issues.  Reviewed risks of anesthesia, infection, DVT, and death.  Wishes to proceed.  Romualdo Prosise G 06/09/2017, 1:17 PM

## 2017-06-09 NOTE — Transfer of Care (Signed)
Immediate Anesthesia Transfer of Care Note  Patient: Todd Armstrong  Procedure(s) Performed: Procedure(s) with comments: ARTHROSCOPY SHOULDER SUBACROMIAL DECOMPRESSION AND ACROMIOPLASTY (Right) - GENERAL ANESTHESIA WITH BLOCK  Patient Location: PACU  Anesthesia Type:GA combined with regional for post-op pain  Level of Consciousness: awake and alert   Airway & Oxygen Therapy: Patient Spontanous Breathing and Patient connected to nasal cannula oxygen  Post-op Assessment: Report given to RN and Post -op Vital signs reviewed and stable  Post vital signs: Reviewed and stable  Last Vitals:  Vitals:   06/09/17 1313 06/09/17 1655  BP: (!) 145/58   Pulse: 70   Resp: 18   Temp: 36.7 C 36.4 C    Last Pain:  Vitals:   06/09/17 1657  TempSrc:   PainSc: (P) 0-No pain      Patients Stated Pain Goal: 5 (43/27/61 4709)  Complications: No apparent anesthesia complications

## 2017-06-09 NOTE — Anesthesia Postprocedure Evaluation (Signed)
Anesthesia Post Note  Patient: Todd Armstrong  Procedure(s) Performed: Procedure(s) (LRB): ARTHROSCOPY SHOULDER SUBACROMIAL DECOMPRESSION AND ACROMIOPLASTY (Right)     Patient location during evaluation: PACU Anesthesia Type: General and Regional Level of consciousness: awake and alert Pain management: pain level controlled Vital Signs Assessment: post-procedure vital signs reviewed and stable Respiratory status: spontaneous breathing, nonlabored ventilation and respiratory function stable Cardiovascular status: blood pressure returned to baseline and stable Postop Assessment: no signs of nausea or vomiting Anesthetic complications: no    Last Vitals:  Vitals:   06/09/17 1726 06/09/17 1742  BP: (!) 144/69 140/66  Pulse: 65   Resp: 20   Temp: 36.1 C     Last Pain:  Vitals:   06/09/17 1726  TempSrc:   PainSc: 0-No pain                 Lavene Penagos,W. EDMOND

## 2017-06-09 NOTE — Op Note (Signed)
Todd Armstrong [id number] 06/09/2017   PRE-OP DIAGNOSIS: right sh imp and RCT  POST-OP DIAGNOSIS: same  PROCEDURE: right sh scope with aply and deb  ANESTHESIA: general and block  [name]   Dictation #:  [id number]

## 2017-06-09 NOTE — Interval H&P Note (Signed)
History and Physical Interval Note:  06/09/2017 1:21 PM  Todd Armstrong  has presented today for surgery, with the diagnosis of RIGHT SHOULDER PARTIAL ROTATOR CUFF TEAR  The various methods of treatment have been discussed with the patient and family. After consideration of risks, benefits and other options for treatment, the patient has consented to  Procedure(s) with comments: ARTHROSCOPY SHOULDER SUBACROMIAL DECOMPRESSION AND ACROMIOPLASTY (Right) - GENERAL ANESTHESIA WITH BLOCK as a surgical intervention .  The patient's history has been reviewed, patient examined, no change in status, stable for surgery.  I have reviewed the patient's chart and labs.  Questions were answered to the patient's satisfaction.     [name]

## 2017-06-09 NOTE — Anesthesia Procedure Notes (Signed)
Procedure Name: Intubation Date/Time: 06/09/2017 3:46 PM Performed by: Ollen Bowl Pre-anesthesia Checklist: Patient identified, Emergency Drugs available, Suction available, Patient being monitored and Timeout performed Patient Re-evaluated:Patient Re-evaluated prior to inductionOxygen Delivery Method: Circle system utilized and Simple face mask Preoxygenation: Pre-oxygenation with 100% oxygen Intubation Type: IV induction Ventilation: Mask ventilation without difficulty Laryngoscope Size: Miller and 3 Grade View: Grade I Tube type: Oral Tube size: 7.5 mm Number of attempts: 1 Airway Equipment and Method: Patient positioned with wedge pillow and Stylet Placement Confirmation: ETT inserted through vocal cords under direct vision,  positive ETCO2 and breath sounds checked- equal and bilateral Secured at: 22 cm Tube secured with: Tape Dental Injury: Teeth and Oropharynx as per pre-operative assessment

## 2017-06-09 NOTE — Anesthesia Procedure Notes (Signed)
Anesthesia Regional Block: Interscalene brachial plexus block   Pre-Anesthetic Checklist: ,, timeout performed, Correct Patient, Correct Site, Correct Laterality, Correct Procedure, Correct Position, site marked, Risks and benefits discussed,  Surgical consent,  Pre-op evaluation,  At surgeon's request and post-op pain management  Laterality: Right  Prep: chloraprep       Needles:  Injection technique: Single-shot  Needle Type: Echogenic Needle     Needle Length: 9cm  Needle Gauge: 21     Additional Needles:   Procedures: ultrasound guided,,,,,,,,  Narrative:  Start time: 06/09/2017 3:05 PM End time: 06/09/2017 3:10 PM Injection made incrementally with aspirations every 5 mL.  Performed by: Personally  Anesthesiologist: Effie Berkshire

## 2017-06-09 NOTE — Anesthesia Preprocedure Evaluation (Signed)
Anesthesia Evaluation  Patient identified by MRN, date of birth, ID band Patient awake    Reviewed: Allergy & Precautions, NPO status , Patient's Chart, lab work & pertinent test results, reviewed documented beta blocker date and time   Airway Mallampati: II  TM Distance: >3 FB Neck ROM: Full    Dental  (+) Partial Lower, Partial Upper   Pulmonary former smoker,    breath sounds clear to auscultation       Cardiovascular hypertension, Pt. on home beta blockers and Pt. on medications + CAD, + Past MI and + Cardiac Stents   Rhythm:Regular Rate:Normal     Neuro/Psych negative neurological ROS  negative psych ROS   GI/Hepatic negative GI ROS, Neg liver ROS,   Endo/Other  diabetes, Type 2, Oral Hypoglycemic Agents  Renal/GU negative Renal ROS  negative genitourinary   Musculoskeletal negative musculoskeletal ROS (+)   Abdominal   Peds negative pediatric ROS (+)  Hematology negative hematology ROS (+)   Anesthesia Other Findings   Reproductive/Obstetrics negative OB ROS                            Lab Results  Component Value Date   WBC 8.0 06/09/2017   HGB 12.9 (L) 06/09/2017   HCT 38.9 (L) 06/09/2017   MCV 92.6 06/09/2017   PLT 185 06/09/2017   Lab Results  Component Value Date   CREATININE 1.09 06/09/2017   BUN 20 06/09/2017   NA 137 06/09/2017   K 3.7 06/09/2017   CL 104 06/09/2017   CO2 25 06/09/2017   No results found for: INR, PROTIME  05/2017 EKG: normal sinus rhythm.  Anesthesia Physical Anesthesia Plan  ASA: III  Anesthesia Plan: General   Post-op Pain Management:  Regional for Post-op pain   Induction: Intravenous  PONV Risk Score and Plan: 3 and Ondansetron, Dexamethasone, Propofol and Midazolam  Airway Management Planned: Oral ETT  Additional Equipment:   Intra-op Plan:   Post-operative Plan: Extubation in OR  Informed Consent: I have reviewed the  patients History and Physical, chart, labs and discussed the procedure including the risks, benefits and alternatives for the proposed anesthesia with the patient or authorized representative who has indicated his/her understanding and acceptance.   Dental advisory given  Plan Discussed with: CRNA  Anesthesia Plan Comments:         Anesthesia Quick Evaluation

## 2017-06-10 ENCOUNTER — Encounter (HOSPITAL_COMMUNITY): Payer: Self-pay | Admitting: Orthopaedic Surgery

## 2017-06-10 NOTE — Op Note (Signed)
NAME:  Todd Armstrong, RUST NO.:  MEDICAL RECORD NO.:  [id number]  LOCATION:                                 FACILITY:  PHYSICIAN:  Monico Blitz. Rhona Raider, M.D.     DATE OF BIRTH:  DATE OF PROCEDURE:  06/09/2017 DATE OF DISCHARGE:                              OPERATIVE REPORT   PREOPERATIVE DIAGNOSES: 1. Right shoulder impingement. 2. Right shoulder rotator cuff tear.  POSTOPERATIVE DIAGNOSES: 1. Right shoulder impingement. 2. Right shoulder rotator cuff tear.  PROCEDURES: 1. Right shoulder arthroscopic acromioplasty. 2. Right shoulder arthroscopic debridement.  ANESTHESIA:  General and block.  SURGEON:  Monico Blitz. Rhona Raider, M.D.  INDICATION FOR PROCEDURE:  The patient is an 83 year old man with a long history of extreme right shoulder pain.  This has persisted despite a couple of attempts at injection and some therapeutic exercises.  By MRI scan, he has a high-grade partial rotator cuff tear.  He cannot sleep and cannot use his arm and is offered an arthroscopy.  This case took place in the main OR due to some cardiac concerns.  An informed operative consent was obtained after discussion of possible complications including reaction to anesthesia, infection, and DVT.  SUMMARY OF FINDINGS AND PROCEDURE:  Under general anesthesia and a block, a right shoulder arthroscopy was performed.  Glenohumeral joint showed no degenerative changes and the biceps tendon looked normal.  The rotator cuff appeared benign from below.  In the subacromial space, he had a moderately prominent subacromial morphology addressed with an acromioplasty.  He also had a prominence of the undersurface of the clavicle and this was removed as well.  He had some authentic partial- thickness tearing of the rotator cuff, maybe a 50% of the thickness of the cuff.  I debrided some large flaps of tissue which likely could have been causing his impingement pain.  I elected not to take this down  and repair further.  He was scheduled to go home same day as soon as he clears anesthesia.  DESCRIPTION OF PROCEDURE:  The patient was taken to the operating suite where general anesthetic was applied.  He was also given a block in the pre-anesthesia area.  He was positioned in a beach-chair position and prepped and draped in normal sterile fashion.  After the administration of preoperative IV Kefzol and an appropriate time out, an arthroscopy of the right shoulder was performed through a total of 2 portals.  Findings were as noted above and procedure consisted of the acromioplasty done with a bur in the lateral position followed by transfer of the bur to the posterior position.  I also decompressed the undersurface of the [facility] joint where he had a very prominent spur.  I then debrided the cuff on the bursal aspect as described above.  The shoulder was thoroughly irrigated followed by removal of arthroscopic equipment.  I then used simple sutures of nylon to loosely reapproximate the portals followed by Adaptic, dry gauze, and tape.  Estimated blood loss and intraoperative fluids obtained from anesthesia records.  DISPOSITION:  The patient was extubated in the operating room and taken to recovery room in stable addition.  He was  to go home the same day and follow up in the office closely.  I will contact him by phone tonight.     Monico Blitz Rhona Raider, M.D.     PGD/MEDQ  D:  06/09/2017  T:  06/09/2017  Job:  [id number]

## 2017-06-13 DIAGNOSIS — L570 Actinic keratosis: Secondary | ICD-10-CM | POA: Diagnosis not present

## 2017-06-13 DIAGNOSIS — D1801 Hemangioma of skin and subcutaneous tissue: Secondary | ICD-10-CM | POA: Diagnosis not present

## 2017-06-13 DIAGNOSIS — L821 Other seborrheic keratosis: Secondary | ICD-10-CM | POA: Diagnosis not present

## 2017-06-13 DIAGNOSIS — Z85828 Personal history of other malignant neoplasm of skin: Secondary | ICD-10-CM | POA: Diagnosis not present

## 2017-06-13 DIAGNOSIS — L812 Freckles: Secondary | ICD-10-CM | POA: Diagnosis not present

## 2017-06-16 DIAGNOSIS — M67911 Unspecified disorder of synovium and tendon, right shoulder: Secondary | ICD-10-CM | POA: Diagnosis not present

## 2017-06-29 ENCOUNTER — Encounter: Payer: Self-pay | Admitting: Cardiovascular Disease

## 2017-06-29 ENCOUNTER — Ambulatory Visit (INDEPENDENT_AMBULATORY_CARE_PROVIDER_SITE_OTHER): Payer: Medicare Other | Admitting: Cardiovascular Disease

## 2017-06-29 VITALS — BP 144/62 | HR 64 | Ht 66.0 in | Wt 161.0 lb

## 2017-06-29 DIAGNOSIS — E782 Mixed hyperlipidemia: Secondary | ICD-10-CM

## 2017-06-29 DIAGNOSIS — I2510 Atherosclerotic heart disease of native coronary artery without angina pectoris: Secondary | ICD-10-CM | POA: Diagnosis not present

## 2017-06-29 LAB — LIPID PANEL
CHOLESTEROL TOTAL: 138 mg/dL (ref 100–199)
Chol/HDL Ratio: 2.3 ratio (ref 0.0–5.0)
HDL: 60 mg/dL (ref >39)
LDL CALC: 55 mg/dL (ref 0–99)
Triglycerides: 117 mg/dL (ref 0–149)
VLDL Cholesterol Cal: 23 mg/dL (ref 5–40)

## 2017-06-29 MED ORDER — NITROGLYCERIN 0.4 MG SL SUBL
0.4000 mg | SUBLINGUAL_TABLET | SUBLINGUAL | 6 refills | Status: DC | PRN
Start: 2017-06-29 — End: 2019-10-17

## 2017-06-29 NOTE — Patient Instructions (Addendum)
Medication Instructions:  Your physician recommends that you continue on your current medications as directed. Please refer to the Current Medication list given to you today.   Labwork: TODAY - cholesterol  Your physician recommends that you return for lab work in: 6 months on the day of or a few days before your office visit with Dr. Nahser.  You will need to FAST for this appointment - nothing to eat or drink after midnight the night before except water.    Testing/Procedures: None Ordered   Follow-Up: Your physician wants you to follow-up in: 6 months with Dr. Nahser.  You will receive a reminder letter in the mail two months in advance. If you don't receive a letter, please call our office to schedule the follow-up appointment.   If you need a refill on your cardiac medications before your next appointment, please call your pharmacy.   Thank you for choosing [facility]! Castiel Lauricella, RN [phone]    

## 2017-06-29 NOTE — Progress Notes (Signed)
Cardiology Office Note   Date:  06/29/2017   ID:  Todd Armstrong, DOB [birth year], MRN [id number]  PCP:  Lavone Orn, MD  Cardiologist:   [name], MD   Chief Complaint  Patient presents with  . Coronary Artery Disease  . Hyperlipidemia   1. CAD, s/p LAD stent 2009 2. HTN 3. Diabetes Mellitus 4. Hyperlipidemia    Todd Armstrong is doing well from a cardiac standpoint. He has not had any episodes of chest pain or shortness breath. [name] been exercising on a regular basis. He had a nice garden this year.   Apr 26, 2013:  Todd Armstrong is doing well. No CP , no dyspnea. He has put in his garden this year.   Nov. 12, 2014:  Apr 24, 2014:  Todd Armstrong is doing well. No CP , no dyspnea.  hes getting his garden going.   Nov. 9, 2015:  Todd Armstrong is doing well. His BP was elevated at his last visit. We increased his Lisinopril/HCTZ and his Bp is much better.    May 15, 2015:   Todd Armstrong is a 83 y.o. male who presents for his CAD Doing well.   No CP or dyspnea.  Still caring for his wife who is ill.  Still very busy working out in the garden   Nov. 29, 2016:  Doing well.  No CP or dyspnea.  Still very busy taking care of his wife, Todd Armstrong  She is having lots of bleeding from her bowels.   May 13, 2016:  Todd Armstrong is doing well.  Still taking care of Todd Armstrong  - she has more bad days than good days .  Enjoys his garden  BP and HR are very good No CP or dyspnea  Nov. 3, 2017:  Doing well.   Worked in his garden this past summer .  BP is well controlled.  HR is slow,  But no syncopal episodes  Having more challenges with wife , Todd Armstrong now.   She seems to have more dementia .  July 18. 2018  No issues, no CP .  BP and HR are well controlled.      Past Medical History:  Diagnosis Date  . Acute myocardial infarction   . Coronary artery disease    PTCA and stenting of his right coronary artery. 3.5 x 16-mm Liberte stent.              . Diabetes  mellitus without complication ([place])   . Dyslipidemia   . Hyperlipemia   . Hypertension   . Partial tear of rotator cuff    right shoulder  . Pulmonary hypertension (HCC)    moderate pulmonary HTN by 2007 echo (RVSP 52 mmHg)  . Wears dentures     Past Surgical History:  Procedure Laterality Date  . CARDIAC CATHETERIZATION  11/05/2008  . CARDIAC CATHETERIZATION  10/22/1993   EF 60%  . coronary stents    . FRACTURE SURGERY     righ fibula and tibia  . HERNIA REPAIR  1984  . MULTIPLE TOOTH EXTRACTIONS    . SHOULDER ARTHROSCOPY Right 06/09/2017   Procedure: ARTHROSCOPY SHOULDER SUBACROMIAL DECOMPRESSION AND ACROMIOPLASTY;  Surgeon: Melrose Nakayama, MD;  Location: [place];  Service: Orthopedics;  Laterality: Right;  GENERAL ANESTHESIA WITH BLOCK     Current Outpatient Prescriptions  Medication Sig Dispense Refill  . aspirin EC 81 MG tablet Take 81 mg by mouth daily.    . carvedilol (COREG) 12.5 MG tablet Take 12.5 mg by  mouth 2 (two) times daily with a meal.    . glimepiride (AMARYL) 1 MG tablet Take 0.5 mg by mouth daily with breakfast.     . lisinopril-hydrochlorothiazide (PRINZIDE,ZESTORETIC) 20-25 MG tablet Take 1 tablet by mouth daily. 90 tablet 1  . metFORMIN (GLUCOPHAGE) 1000 MG tablet Take 1,000 mg by mouth 2 (two) times daily with a meal.      . nitroGLYCERIN (NITROSTAT) 0.4 MG SL tablet Place 0.4 mg under the tongue every 5 (five) minutes as needed for chest pain.     [name] oxyCODONE-acetaminophen (ROXICET) 5-325 MG tablet Take 1-2 tablets by mouth every 6 (six) hours as needed for severe pain. 30 tablet 0  . rosuvastatin (CRESTOR) 10 MG tablet Take 10 mg by mouth daily.     No current facility-administered medications for this visit.     Allergies:   No known allergies    Social History:  The patient  reports that he has quit smoking. His smoking use included Cigarettes. He has never used smokeless tobacco. He reports that he does not drink alcohol or use drugs.   Family  History:  The patient's family history includes Heart attack in his father.    ROS:  Please see the history of present illness.    Review of Systems: Constitutional:  denies fever, chills, diaphoresis, appetite change and fatigue.  HEENT: denies photophobia, eye pain, redness, hearing loss, ear pain, congestion, sore throat, rhinorrhea, sneezing, neck pain, neck stiffness and tinnitus.  Respiratory: denies SOB, DOE, cough, chest tightness, and wheezing.  Cardiovascular: denies chest pain, palpitations and leg swelling.  Gastrointestinal: denies nausea, vomiting, abdominal pain, diarrhea, constipation, blood in stool.  Genitourinary: denies dysuria, urgency, frequency, hematuria, flank pain and difficulty urinating.  Musculoskeletal: denies  myalgias, back pain, joint swelling, arthralgias and gait problem.   Skin: denies pallor, rash and wound.  Neurological: denies dizziness, seizures, syncope, weakness, light-headedness, numbness and headaches.   Hematological: denies adenopathy, easy bruising, personal or family bleeding history.  Psychiatric/ Behavioral: denies suicidal ideation, mood changes, confusion, nervousness, sleep disturbance and agitation.      All other systems are reviewed and negative.   PHYSICAL EXAM: VS:  BP (!) 144/62   Pulse 64   Ht 5\' 6"  (1.676 m)   Wt 161 lb (73 kg)   BMI 25.99 kg/m  , BMI Body mass index is 25.99 kg/m. GEN: Well nourished, well developed, in no acute distress  HEENT: normal  Neck: no JVD, soft bilateral carotid bruits,   Cardiac: RRR; no murmurs, rubs, or gallops,no edema  Respiratory:  clear to auscultation bilaterally, normal work of breathing GI: soft, nontender, nondistended, + BS MS: no deformity or atrophy  Skin: warm and dry, no rash Neuro:  Strength and sensation are intact Psych: normal   EKG:  EKG is ordered today. Sinus rady at 55.   Otherwise normal eCG    Recent Labs: 10/15/2016: ALT 14 06/09/2017: BUN 20;  Creatinine, Ser 1.09; Hemoglobin 12.9; Platelets 185; Potassium 3.7; Sodium 137    Lipid Panel    Component Value Date/Time   CHOL 142 10/15/2016 1215   TRIG 170 (H) 10/15/2016 1215   HDL 63 10/15/2016 1215   CHOLHDL 2.3 10/15/2016 1215   VLDL 34 (H) 10/15/2016 1215   LDLCALC 45 10/15/2016 1215      Wt Readings from Last 3 Encounters:  06/29/17 161 lb (73 kg)  06/09/17 180 lb (81.6 kg)  10/15/16 165 lb (74.8 kg)      Other  studies Reviewed: Additional studies/ records that were reviewed today include: . Review of the above records demonstrates:    ASSESSMENT AND PLAN:  1. CAD, s/p LAD stent 2009 - he's doing very well. [name] not having any episodes of chest pain.  2. HTN - blood pressure is well-controlled. Continue current medications.  3. Diabetes Mellitus 4. Hyperlipidemia - will check lipids today    5. Carotid bruits:   Will get carotid duplex scan .     Current medicines are reviewed at length with the patient today.  The patient does not have concerns regarding medicines.  The following changes have been made:  no change  Labs/ tests ordered today include:  No orders of the defined types were placed in this encounter.    Disposition:   FU with me in 6 months      [name], MD  06/29/2017 10:47 AM    [facility] [address] [place], [place], [place]  [address] Phone: [phone]; Fax: [phone]

## 2017-07-01 DIAGNOSIS — M67911 Unspecified disorder of synovium and tendon, right shoulder: Secondary | ICD-10-CM | POA: Diagnosis not present

## 2017-08-01 DIAGNOSIS — Z9889 Other specified postprocedural states: Secondary | ICD-10-CM | POA: Diagnosis not present

## 2017-08-02 DIAGNOSIS — E1351 Other specified diabetes mellitus with diabetic peripheral angiopathy without gangrene: Secondary | ICD-10-CM | POA: Diagnosis not present

## 2017-08-02 DIAGNOSIS — L602 Onychogryphosis: Secondary | ICD-10-CM | POA: Diagnosis not present

## 2017-08-02 DIAGNOSIS — L84 Corns and callosities: Secondary | ICD-10-CM | POA: Diagnosis not present

## 2017-08-10 DIAGNOSIS — Z7984 Long term (current) use of oral hypoglycemic drugs: Secondary | ICD-10-CM | POA: Diagnosis not present

## 2017-08-10 DIAGNOSIS — I10 Essential (primary) hypertension: Secondary | ICD-10-CM | POA: Diagnosis not present

## 2017-08-10 DIAGNOSIS — E119 Type 2 diabetes mellitus without complications: Secondary | ICD-10-CM | POA: Diagnosis not present

## 2017-08-10 DIAGNOSIS — E1165 Type 2 diabetes mellitus with hyperglycemia: Secondary | ICD-10-CM | POA: Diagnosis not present

## 2017-08-25 DIAGNOSIS — J069 Acute upper respiratory infection, unspecified: Secondary | ICD-10-CM | POA: Diagnosis not present

## 2017-09-13 DIAGNOSIS — E119 Type 2 diabetes mellitus without complications: Secondary | ICD-10-CM | POA: Diagnosis not present

## 2017-09-13 DIAGNOSIS — H2513 Age-related nuclear cataract, bilateral: Secondary | ICD-10-CM | POA: Diagnosis not present

## 2017-10-04 DIAGNOSIS — L84 Corns and callosities: Secondary | ICD-10-CM | POA: Diagnosis not present

## 2017-10-04 DIAGNOSIS — L602 Onychogryphosis: Secondary | ICD-10-CM | POA: Diagnosis not present

## 2017-10-04 DIAGNOSIS — E1351 Other specified diabetes mellitus with diabetic peripheral angiopathy without gangrene: Secondary | ICD-10-CM | POA: Diagnosis not present

## 2017-10-05 ENCOUNTER — Other Ambulatory Visit: Payer: Self-pay | Admitting: *Deleted

## 2017-10-05 MED ORDER — ROSUVASTATIN CALCIUM 10 MG PO TABS: 10.0000 mg | ORAL_TABLET | Freq: Every day | ORAL | 2 refills | Status: DC

## 2017-10-06 ENCOUNTER — Other Ambulatory Visit: Payer: Self-pay | Admitting: *Deleted

## 2017-10-06 DIAGNOSIS — I6523 Occlusion and stenosis of bilateral carotid arteries: Secondary | ICD-10-CM

## 2017-10-13 DIAGNOSIS — Z23 Encounter for immunization: Secondary | ICD-10-CM | POA: Diagnosis not present

## 2017-10-25 DIAGNOSIS — L570 Actinic keratosis: Secondary | ICD-10-CM | POA: Diagnosis not present

## 2017-10-25 DIAGNOSIS — L812 Freckles: Secondary | ICD-10-CM | POA: Diagnosis not present

## 2017-10-25 DIAGNOSIS — Z85828 Personal history of other malignant neoplasm of skin: Secondary | ICD-10-CM | POA: Diagnosis not present

## 2017-10-25 DIAGNOSIS — L308 Other specified dermatitis: Secondary | ICD-10-CM | POA: Diagnosis not present

## 2017-10-25 DIAGNOSIS — L858 Other specified epidermal thickening: Secondary | ICD-10-CM | POA: Diagnosis not present

## 2017-10-25 DIAGNOSIS — D485 Neoplasm of uncertain behavior of skin: Secondary | ICD-10-CM | POA: Diagnosis not present

## 2017-10-25 DIAGNOSIS — L821 Other seborrheic keratosis: Secondary | ICD-10-CM | POA: Diagnosis not present

## 2017-11-07 ENCOUNTER — Ambulatory Visit (HOSPITAL_COMMUNITY)
Admission: RE | Admit: 2017-11-07 | Discharge: 2017-11-07 | Disposition: A | Discharge status: Home / Self Care | Payer: Medicare Other | Source: Ambulatory Visit | Attending: Cardiology | Admitting: Cardiology

## 2017-11-07 DIAGNOSIS — I6523 Occlusion and stenosis of bilateral carotid arteries: Secondary | ICD-10-CM | POA: Diagnosis not present

## 2017-11-08 ENCOUNTER — Other Ambulatory Visit: Payer: Self-pay

## 2017-11-08 ENCOUNTER — Telehealth: Payer: Self-pay | Admitting: Cardiovascular Disease

## 2017-11-08 MED ORDER — CARVEDILOL 12.5 MG PO TABS: 12.5000 mg | ORAL_TABLET | Freq: Two times a day (BID) | ORAL | 1 refills | Status: DC

## 2017-11-08 NOTE — Telephone Encounter (Signed)
New message   Patient calling for carotid results. Please call

## 2017-11-08 NOTE — Telephone Encounter (Signed)
Reviewed carotid results with patient and advised him to call sooner if he develops symptoms of dizziness, light-headedness, TIA or other concerns. He states he is scheduled to see Dr. Acie Fredrickson in February and agrees to make [place] aware of any concerns. He thanked me for the call.

## 2017-11-14 ENCOUNTER — Other Ambulatory Visit: Payer: Self-pay | Admitting: Cardiovascular Disease

## 2017-12-20 DIAGNOSIS — L84 Corns and callosities: Secondary | ICD-10-CM | POA: Diagnosis not present

## 2017-12-20 DIAGNOSIS — L602 Onychogryphosis: Secondary | ICD-10-CM | POA: Diagnosis not present

## 2017-12-20 DIAGNOSIS — E1351 Other specified diabetes mellitus with diabetic peripheral angiopathy without gangrene: Secondary | ICD-10-CM | POA: Diagnosis not present

## 2018-01-11 DIAGNOSIS — G459 Transient cerebral ischemic attack, unspecified: Secondary | ICD-10-CM | POA: Diagnosis not present

## 2018-01-11 DIAGNOSIS — I10 Essential (primary) hypertension: Secondary | ICD-10-CM | POA: Diagnosis not present

## 2018-01-11 DIAGNOSIS — E119 Type 2 diabetes mellitus without complications: Secondary | ICD-10-CM | POA: Diagnosis not present

## 2018-01-11 DIAGNOSIS — N400 Enlarged prostate without lower urinary tract symptoms: Secondary | ICD-10-CM | POA: Diagnosis not present

## 2018-01-11 DIAGNOSIS — I2510 Atherosclerotic heart disease of native coronary artery without angina pectoris: Secondary | ICD-10-CM | POA: Diagnosis not present

## 2018-01-11 DIAGNOSIS — I252 Old myocardial infarction: Secondary | ICD-10-CM | POA: Diagnosis not present

## 2018-01-11 DIAGNOSIS — Z7984 Long term (current) use of oral hypoglycemic drugs: Secondary | ICD-10-CM | POA: Diagnosis not present

## 2018-01-18 ENCOUNTER — Encounter: Payer: Self-pay | Admitting: Cardiovascular Disease

## 2018-01-18 ENCOUNTER — Other Ambulatory Visit: Payer: Medicare Other | Admitting: *Deleted

## 2018-01-18 ENCOUNTER — Ambulatory Visit (INDEPENDENT_AMBULATORY_CARE_PROVIDER_SITE_OTHER): Payer: Medicare Other | Admitting: Cardiovascular Disease

## 2018-01-18 ENCOUNTER — Other Ambulatory Visit: Payer: Self-pay | Admitting: Cardiovascular Disease

## 2018-01-18 VITALS — BP 138/56 | HR 62 | Ht 68.0 in | Wt 161.0 lb

## 2018-01-18 DIAGNOSIS — E782 Mixed hyperlipidemia: Secondary | ICD-10-CM

## 2018-01-18 DIAGNOSIS — I2510 Atherosclerotic heart disease of native coronary artery without angina pectoris: Secondary | ICD-10-CM

## 2018-01-18 DIAGNOSIS — I6523 Occlusion and stenosis of bilateral carotid arteries: Secondary | ICD-10-CM

## 2018-01-18 LAB — LIPID PANEL
CHOL/HDL RATIO: 1.8 ratio (ref 0.0–5.0)
Cholesterol, Total: 135 mg/dL (ref 100–199)
HDL: 76 mg/dL (ref >39)
LDL Calculated: 42 mg/dL (ref 0–99)
Triglycerides: 85 mg/dL (ref 0–149)
VLDL CHOLESTEROL CAL: 17 mg/dL (ref 5–40)

## 2018-01-18 LAB — HEPATIC FUNCTION PANEL
ALT: 13 IU/L (ref 0–44)
AST: 16 IU/L (ref 0–40)
Albumin: 4.4 g/dL (ref 3.5–4.7)
Alkaline Phosphatase: 54 IU/L (ref 39–117)
Bilirubin Total: 0.5 mg/dL (ref 0.0–1.2)
Bilirubin, Direct: 0.17 mg/dL (ref 0.00–0.40)
Total Protein: 6.3 g/dL (ref 6.0–8.5)

## 2018-01-18 LAB — BASIC METABOLIC PANEL
BUN / CREAT RATIO: 16 (ref 10–24)
BUN: 18 mg/dL (ref 8–27)
CHLORIDE: 100 mmol/L (ref 96–106)
CO2: 24 mmol/L (ref 20–29)
Calcium: 9.3 mg/dL (ref 8.6–10.2)
Creatinine, Ser: 1.1 mg/dL (ref 0.76–1.27)
GFR calc Af Amer: 71 mL/min/{1.73_m2} (ref >59)
GFR calc non Af Amer: 61 mL/min/{1.73_m2} (ref >59)
GLUCOSE: 137 mg/dL — AB (ref 65–99)
Potassium: 4.4 mmol/L (ref 3.5–5.2)
SODIUM: 138 mmol/L (ref 134–144)

## 2018-01-18 NOTE — Patient Instructions (Addendum)
Medication Instructions:  Your physician recommends that you continue on your current medications as directed. Please refer to the Current Medication list given to you today.   Labwork: TODAY - cholesterol, liver panel, basic metabolic panel   Testing/Procedures: Your physician has requested that you have a carotid duplex November 2019. This test is an ultrasound of the carotid arteries in your neck. It looks at blood flow through these arteries that supply the brain with blood. Allow one hour for this exam. There are no restrictions or special instructions.   Follow-Up: Your physician wants you to follow-up in: 6 months with Dr. Acie Fredrickson.  You will receive a reminder letter in the mail two months in advance. If you don't receive a letter, please call our office to schedule the follow-up appointment.   If you need a refill on your cardiac medications before your next appointment, please call your pharmacy.   Thank you for choosing [facility]! Christen Bame, RN [phone]

## 2018-01-18 NOTE — Progress Notes (Signed)
Cardiology Office Note   Date:  01/18/2018   ID:  Todd Armstrong, DOB 1931-08-12, MRN [id number]  PCP:  Todd Orn, MD  Cardiologist:   Todd Moores, MD   Chief Complaint  Patient presents with  . Coronary Artery Disease   1. CAD, s/p LAD stent 2009 2. HTN 3. Diabetes Mellitus 4. Hyperlipidemia 5. Carotid artery stenosis  - Right - mod- severe    Todd Armstrong is doing well from a cardiac standpoint. He has not had any episodes of chest pain or shortness breath. [name] been exercising on a regular basis. He had a nice garden this year.   Apr 26, 2013:  Todd Armstrong is doing well. No CP , no dyspnea. He has put in his garden this year.   Nov. 12, 2014:  Apr 24, 2014:  Todd Armstrong is doing well. No CP , no dyspnea.  hes getting his garden going.   Nov. 9, 2015:  Todd Armstrong is doing well. His BP was elevated at his last visit. We increased his Lisinopril/HCTZ and his Bp is much better.    May 15, 2015:   Todd Armstrong is a 84 y.o. male who presents for his CAD Doing well.   No CP or dyspnea.  Still caring for his wife who is ill.  Still very busy working out in the garden   Nov. 29, 2016:  Doing well.  No CP or dyspnea.  Still very busy taking care of his wife, Todd Armstrong  She is having lots of bleeding from her bowels.   May 13, 2016:  Todd Armstrong is doing well.  Still taking care of Todd Armstrong  - she has more bad days than good days .  Enjoys his garden  BP and HR are very good No CP or dyspnea  Nov. 3, 2017:  Doing well.   Worked in his garden this past summer .  BP is well controlled.  HR is slow,  But no syncopal episodes  Having more challenges with wife , Todd Armstrong now.   She seems to have more dementia .  July 18. 2018  No issues, no CP .  BP and HR are well controlled.   January 18, 2018:  Doing well.   Stays busy.    He has moderate to severe right carotid artery stenosis.  Recommendation is for a follow-up in 12 months which will be November,  2019.   Past Medical History:  Diagnosis Date  . Acute myocardial infarction   . Coronary artery disease    PTCA and stenting of his right coronary artery. 3.5 x 16-mm Liberte stent.              . Diabetes mellitus without complication ([place])   . Dyslipidemia   . Hyperlipemia   . Hypertension   . Partial tear of rotator cuff    right shoulder  . Pulmonary hypertension (HCC)    moderate pulmonary HTN by 2007 echo (RVSP 52 mmHg)  . Wears dentures     Past Surgical History:  Procedure Laterality Date  . CARDIAC CATHETERIZATION  11/05/2008  . CARDIAC CATHETERIZATION  10/22/1993   EF 60%  . coronary stents    . FRACTURE SURGERY     righ fibula and tibia  . HERNIA REPAIR  1984  . MULTIPLE TOOTH EXTRACTIONS    . SHOULDER ARTHROSCOPY Right 06/09/2017   Procedure: ARTHROSCOPY SHOULDER SUBACROMIAL DECOMPRESSION AND ACROMIOPLASTY;  Surgeon: Todd Nakayama, MD;  Location: [place];  Service: Orthopedics;  Laterality: Right;  GENERAL ANESTHESIA WITH BLOCK     Current Outpatient Medications  Medication Sig Dispense Refill  . aspirin EC 81 MG tablet Take 81 mg by mouth daily.    . carvedilol (COREG) 12.5 MG tablet Take 1 tablet (12.5 mg total) by mouth 2 (two) times daily with a meal. 180 tablet 1  . lisinopril-hydrochlorothiazide (PRINZIDE,ZESTORETIC) 20-25 MG tablet TAKE 1 TABLET BY MOUTH DAILY 90 tablet 2  . metFORMIN (GLUCOPHAGE) 1000 MG tablet Take 1,000 mg by mouth 2 (two) times daily with a meal.      . nitroGLYCERIN (NITROSTAT) 0.4 MG SL tablet Place 1 tablet (0.4 mg total) under the tongue every 5 (five) minutes as needed for chest pain. 25 tablet 6  . rosuvastatin (CRESTOR) 10 MG tablet Take 1 tablet (10 mg total) by mouth daily. 90 tablet 2   No current facility-administered medications for this visit.     Allergies:   No known allergies    Social History:  The patient  reports that he has quit smoking. His smoking use included cigarettes. he has never used smokeless  tobacco. He reports that he does not drink alcohol or use drugs.   Family History:  The patient's family history includes Heart attack in his father.    ROS: As per current history.  All other systems are negative.   Physical Exam: Blood pressure (!) 138/56, pulse 62, height 5\' 8"  (1.727 m), weight 161 lb (73 kg).  GEN:  Well nourished, well developed in no acute distress HEENT: Normal NECK: No JVD;  Soft right carotid bruit LYMPHATICS: No lymphadenopathy CARDIAC: RR, normal S1S2  RESPIRATORY:  Clear to auscultation without rales, wheezing or rhonchi  ABDOMEN: Soft, non-tender, non-distended MUSCULOSKELETAL:  No edema; No deformity  SKIN: Warm and dry NEUROLOGIC:  Alert and oriented x 3   EKG:    Normal sinus rhythm at 62.  He has a first-degree AV block.  There is a nonspecific IVCD.   Recent Labs: 06/09/2017: BUN 20; Creatinine, Ser 1.09; Hemoglobin 12.9; Platelets 185; Potassium 3.7; Sodium 137    Lipid Panel    Component Value Date/Time   CHOL 138 06/29/2017 1108   TRIG 117 06/29/2017 1108   HDL 60 06/29/2017 1108   CHOLHDL 2.3 06/29/2017 1108   CHOLHDL 2.3 10/15/2016 1215   VLDL 34 (H) 10/15/2016 1215   LDLCALC 55 06/29/2017 1108      Wt Readings from Last 3 Encounters:  01/18/18 161 lb (73 kg)  06/29/17 161 lb (73 kg)  06/09/17 180 lb (81.6 kg)      Other studies Reviewed: Additional studies/ records that were reviewed today include: . Review of the above records demonstrates:    ASSESSMENT AND PLAN:  1. CAD, s/p LAD stent 2009 -he denies having any angina.  2.  Carotid artery stenosis: Carotid duplex scan in November, 2018 revealed moderate to severe right internal carotid stenosis.  He is scheduled for repeat study in November, 2019.  He denies any stroke or TIA symptoms.  Continue aspirin.  2. HTN -his blood pressure is well controlled.  3. Diabetes Mellitus 4. Hyperlipidemia -his last lipid levels look great.  We will recheck fasting lipids  today.  5. Carotid bruits:   .    Current medicines are reviewed at length with the patient today.  The patient does not have concerns regarding medicines.  The following changes have been made:  no change  Labs/ tests ordered today include:  No orders of the defined types were  placed in this encounter.   Disposition:   FU with me in 6 months     Todd Moores, MD  01/18/2018 10:59 AM    [facility] [address], [place], [place]  [address] Phone: [phone]; Fax: [phone]

## 2018-01-27 ENCOUNTER — Other Ambulatory Visit: Payer: Self-pay | Admitting: Internal Medicine

## 2018-01-27 ENCOUNTER — Ambulatory Visit
Admission: RE | Admit: 2018-01-27 | Discharge: 2018-01-27 | Disposition: A | Discharge status: Home / Self Care | Payer: Medicare Other | Source: Ambulatory Visit | Attending: Internal Medicine | Admitting: Internal Medicine

## 2018-01-27 ENCOUNTER — Telehealth: Payer: Self-pay | Admitting: Cardiovascular Disease

## 2018-01-27 DIAGNOSIS — G459 Transient cerebral ischemic attack, unspecified: Secondary | ICD-10-CM

## 2018-01-27 DIAGNOSIS — R42 Dizziness and giddiness: Secondary | ICD-10-CM | POA: Diagnosis not present

## 2018-01-27 DIAGNOSIS — R0989 Other specified symptoms and signs involving the circulatory and respiratory systems: Secondary | ICD-10-CM | POA: Diagnosis not present
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
## 2018-01-27 NOTE — Telephone Encounter (Signed)
Discussed with Harrington Challenger and suggest that carotid doppler can be now versus November and that patient needed to contact his PCP r/e his symptoms.

## 2018-01-27 NOTE — Telephone Encounter (Signed)
Patient advised and also advised that if his symptoms return or get worse, that he needed to go to the ED for an evaluation. Patient verbalized understanding of plan.

## 2018-01-27 NOTE — Telephone Encounter (Signed)
Patient calling,  Pt c/o Syncope: STAT if syncope occurred within 30 minutes and pt complains of lightheadedness [place] if episode of passing out, completely, today or in last 24 hours   1. Did you pass out today? no  2. When is the last time you passed out? Yesterday// 01-26-18  3. Has this occurred multiple times? no  4. Did you have any symptoms prior to passing out? Loss of  Vision and was  disoriented   Patient states that he believes this incident is in relation to the blockage that was discovered at his last appointment.

## 2018-01-29 NOTE — Telephone Encounter (Signed)
Agree with carotid dopplers. He should consult with his primary MD to evaluate other possible causes

## 2018-02-02 ENCOUNTER — Ambulatory Visit
Admission: RE | Admit: 2018-02-02 | Discharge: 2018-02-02 | Disposition: A | Discharge status: Home / Self Care | Payer: Medicare Other | Source: Ambulatory Visit | Attending: Internal Medicine | Admitting: Internal Medicine

## 2018-02-02 DIAGNOSIS — I6523 Occlusion and stenosis of bilateral carotid arteries: Secondary | ICD-10-CM | POA: Diagnosis not present

## 2018-02-02 DIAGNOSIS — G459 Transient cerebral ischemic attack, unspecified: Secondary | ICD-10-CM

## 2018-02-03 NOTE — Telephone Encounter (Signed)
Spoke with patient who states he has an appointment on 3/7 with [facility] for follow-up on his abnormal carotid dopplers. He thanked me for the follow-up call.

## 2018-02-06 ENCOUNTER — Encounter (HOSPITAL_COMMUNITY): Payer: Medicare Other

## 2018-02-06 ENCOUNTER — Encounter: Payer: Medicare Other | Admitting: Surgery

## 2018-02-09 ENCOUNTER — Other Ambulatory Visit: Payer: Self-pay | Admitting: Cardiovascular Disease

## 2018-02-09 DIAGNOSIS — G459 Transient cerebral ischemic attack, unspecified: Secondary | ICD-10-CM | POA: Diagnosis not present

## 2018-02-09 DIAGNOSIS — I6521 Occlusion and stenosis of right carotid artery: Secondary | ICD-10-CM | POA: Diagnosis not present

## 2018-02-14 DIAGNOSIS — L570 Actinic keratosis: Secondary | ICD-10-CM | POA: Diagnosis not present

## 2018-02-14 DIAGNOSIS — L218 Other seborrheic dermatitis: Secondary | ICD-10-CM | POA: Diagnosis not present

## 2018-02-14 DIAGNOSIS — L821 Other seborrheic keratosis: Secondary | ICD-10-CM | POA: Diagnosis not present

## 2018-02-14 DIAGNOSIS — Z85828 Personal history of other malignant neoplasm of skin: Secondary | ICD-10-CM | POA: Diagnosis not present

## 2018-02-16 ENCOUNTER — Encounter (HOSPITAL_COMMUNITY): Payer: Medicare Other

## 2018-02-17 ENCOUNTER — Ambulatory Visit (HOSPITAL_COMMUNITY)
Admission: RE | Admit: 2018-02-17 | Discharge: 2018-02-17 | Disposition: A | Discharge status: Home / Self Care | Payer: Medicare Other | Source: Ambulatory Visit | Attending: Vascular Surgery | Admitting: Vascular Surgery

## 2018-02-17 ENCOUNTER — Ambulatory Visit (INDEPENDENT_AMBULATORY_CARE_PROVIDER_SITE_OTHER): Payer: Medicare Other | Admitting: Vascular Surgery

## 2018-02-17 ENCOUNTER — Encounter: Payer: Medicare Other | Admitting: Vascular Surgery

## 2018-02-17 ENCOUNTER — Encounter: Payer: Self-pay | Admitting: Vascular Surgery

## 2018-02-17 VITALS — BP 137/75 | HR 58 | Temp 97.0°F | Resp 20 | Ht 68.0 in | Wt 159.7 lb

## 2018-02-17 DIAGNOSIS — I6521 Occlusion and stenosis of right carotid artery: Secondary | ICD-10-CM

## 2018-02-17 DIAGNOSIS — I6523 Occlusion and stenosis of bilateral carotid arteries: Secondary | ICD-10-CM | POA: Diagnosis not present

## 2018-02-17 DIAGNOSIS — I2510 Atherosclerotic heart disease of native coronary artery without angina pectoris: Secondary | ICD-10-CM | POA: Diagnosis not present

## 2018-02-17 NOTE — Progress Notes (Signed)
Patient ID: Todd Armstrong, male   DOB: [birth year], 84 y.o.   MRN: [id number]  Reason for Consult: No chief complaint on file.   Referred by Lavone Orn, MD  Subjective:     HPI:  Todd Armstrong is a 84 y.o. male with a history of coronary artery disease and hypertension hyperlipidemia and diabetes.  He recently had an event while driving that he was disoriented but was able to drive all the way home without incident.  At the time he had no lateralizing symptoms he cannot really remember the event at this time.  Since that time he has been placed on dual antiplatelet therapy and he does take a statin drug.  He does not take any blood thinners.  He also had an event while raising up where he became lightheaded but this resolved with standing.  He did have a CT which demonstrated old MCA infarct of undetermined time period but he does not remember having a stroke.  He has never had a frank TIA or amaurosis.  He has not had chest pain or shortness of breath recently.  Past Medical History:  Diagnosis Date  . Acute myocardial infarction   . Coronary artery disease    PTCA and stenting of his right coronary artery. 3.5 x 16-mm Liberte stent.              . Diabetes mellitus without complication ([place])   . Dyslipidemia   . Hyperlipemia   . Hypertension   . Partial tear of rotator cuff    right shoulder  . Pulmonary hypertension (HCC)    moderate pulmonary HTN by 2007 echo (RVSP 52 mmHg)  . Wears dentures    Family History  Problem Relation Age of Onset  . Heart attack Father    Past Surgical History:  Procedure Laterality Date  . CARDIAC CATHETERIZATION  11/05/2008  . CARDIAC CATHETERIZATION  10/22/1993   EF 60%  . coronary stents    . FRACTURE SURGERY     righ fibula and tibia  . HERNIA REPAIR  1984  . MULTIPLE TOOTH EXTRACTIONS    . SHOULDER ARTHROSCOPY Right 06/09/2017   Procedure: ARTHROSCOPY SHOULDER SUBACROMIAL DECOMPRESSION AND ACROMIOPLASTY;  Surgeon: Melrose Nakayama, MD;  Location: [place];  Service: Orthopedics;  Laterality: Right;  GENERAL ANESTHESIA WITH BLOCK    Short Social History:  Social History   Tobacco Use  . Smoking status: Former Smoker    Types: Cigarettes  . Smokeless tobacco: Never Used  . Tobacco comment: quit smoking cigarettes > 40 years ago  Substance Use Topics  . Alcohol use: No    Allergies  Allergen Reactions  . No Known Allergies     Current Outpatient Medications  Medication Sig Dispense Refill  . aspirin EC 81 MG tablet Take 81 mg by mouth daily.    . carvedilol (COREG) 12.5 MG tablet TAKE 1 TABLET BY MOUTH TWICE DAILY WITH A MEAL 180 tablet 2  . lisinopril-hydrochlorothiazide (PRINZIDE,ZESTORETIC) 20-25 MG tablet TAKE 1 TABLET BY MOUTH DAILY 90 tablet 2  . metFORMIN (GLUCOPHAGE) 1000 MG tablet Take 1,000 mg by mouth 2 (two) times daily with a meal.      . nitroGLYCERIN (NITROSTAT) 0.4 MG SL tablet Place 1 tablet (0.4 mg total) under the tongue every 5 (five) minutes as needed for chest pain. 25 tablet 6  . rosuvastatin (CRESTOR) 10 MG tablet Take 1 tablet (10 mg total) by mouth daily. 90 tablet 2   No current  facility-administered medications for this visit.     Review of Systems  Constitutional:  Constitutional negative. HENT: HENT negative.  Eyes: Eyes negative.  Respiratory: Respiratory negative.  Cardiovascular: Cardiovascular negative.  GI: Gastrointestinal negative.  Musculoskeletal: Musculoskeletal negative.  Neurological: Positive for syncope.  Hematologic: Hematologic/lymphatic negative.  Psychiatric: Positive for confusion.        Objective:  Objective   There were no vitals filed for this visit. There is no height or weight on file to calculate BMI.  Physical Exam  Constitutional: He is oriented to person, place, and time. He appears well-developed.  HENT:  Head: Normocephalic.  Eyes: Pupils are equal, round, and reactive to light.  Neck: Normal range of motion.  Cardiovascular:  Normal rate.  Pulses:      Radial pulses are 2+ on the right side, and 2+ on the left side.       Femoral pulses are 2+ on the right side, and 2+ on the left side.      Popliteal pulses are 2+ on the right side, and 2+ on the left side.  Abdominal: Soft.  Musculoskeletal: Normal range of motion. He exhibits no edema.  Neurological: He is alert and oriented to person, place, and time.  Skin: Skin is warm and dry.  Psychiatric: He has a normal mood and affect. His behavior is normal. Judgment and thought content normal.    Data:      Assessment/Plan:     84 year old male with a recent syncopal event of undetermined etiology.  He does have a 60-79% stenosis of his right internal carotid artery that may be underestimated and there is also possible an ulceration.  By the letter of the law he does not really meet criteria for right carotid intervention.  He is now on dual antiplatelet therapy with statin with maximal medical management.  I discussed with him the options being continued medical management and surveillance versus surgical intervention versus obtaining further information with CT Angio of his head and neck.  At this time we will proceed with the latter and he will follow-up in a few weeks after having this test.  If the options after this test includes trans-carotid artery stenting versus carotid endarterectomy which we briefly discussed today.  He has any further events he will proceed emergently to the hospital we can discuss intervention at that time.  I have instructed him to probably avoid driving until we have him fully worked up.     Waynetta Sandy MD [facility]

## 2018-02-20 ENCOUNTER — Other Ambulatory Visit: Payer: Self-pay

## 2018-02-20 DIAGNOSIS — I6529 Occlusion and stenosis of unspecified carotid artery: Secondary | ICD-10-CM

## 2018-02-20 DIAGNOSIS — R0989 Other specified symptoms and signs involving the circulatory and respiratory systems: Secondary | ICD-10-CM

## 2018-02-20 DIAGNOSIS — I2510 Atherosclerotic heart disease of native coronary artery without angina pectoris: Secondary | ICD-10-CM

## 2018-02-22 ENCOUNTER — Other Ambulatory Visit: Payer: Self-pay | Admitting: *Deleted

## 2018-02-22 MED ORDER — CLOPIDOGREL BISULFATE 75 MG PO TABS: 75.0000 mg | ORAL_TABLET | Freq: Every day | ORAL | 5 refills | Status: DC

## 2018-03-01 ENCOUNTER — Encounter (HOSPITAL_COMMUNITY): Payer: Medicare Other

## 2018-03-01 ENCOUNTER — Encounter: Payer: Medicare Other | Admitting: Vascular Surgery

## 2018-03-08 DIAGNOSIS — L602 Onychogryphosis: Secondary | ICD-10-CM | POA: Diagnosis not present

## 2018-03-08 DIAGNOSIS — E1351 Other specified diabetes mellitus with diabetic peripheral angiopathy without gangrene: Secondary | ICD-10-CM | POA: Diagnosis not present

## 2018-03-08 DIAGNOSIS — L84 Corns and callosities: Secondary | ICD-10-CM | POA: Diagnosis not present

## 2018-03-10 ENCOUNTER — Other Ambulatory Visit: Payer: Medicare Other

## 2018-03-10 ENCOUNTER — Encounter: Payer: Self-pay | Admitting: *Deleted

## 2018-03-10 ENCOUNTER — Encounter: Payer: Self-pay | Admitting: Vascular Surgery

## 2018-03-10 ENCOUNTER — Inpatient Hospital Stay: Admission: RE | Admit: 2018-03-10 | Payer: Medicare Other | Source: Ambulatory Visit

## 2018-03-10 ENCOUNTER — Other Ambulatory Visit: Payer: Self-pay

## 2018-03-10 ENCOUNTER — Other Ambulatory Visit: Payer: Self-pay | Admitting: *Deleted

## 2018-03-10 ENCOUNTER — Ambulatory Visit (INDEPENDENT_AMBULATORY_CARE_PROVIDER_SITE_OTHER): Payer: Medicare Other | Admitting: Vascular Surgery

## 2018-03-10 ENCOUNTER — Ambulatory Visit
Admission: RE | Admit: 2018-03-10 | Discharge: 2018-03-10 | Disposition: A | Discharge status: Home / Self Care | Payer: Medicare Other | Source: Ambulatory Visit | Attending: Vascular Surgery | Admitting: Vascular Surgery

## 2018-03-10 VITALS — BP 86/54 | HR 74 | Temp 97.0°F | Resp 20 | Ht 68.0 in | Wt 160.0 lb

## 2018-03-10 DIAGNOSIS — I6529 Occlusion and stenosis of unspecified carotid artery: Secondary | ICD-10-CM

## 2018-03-10 DIAGNOSIS — I639 Cerebral infarction, unspecified: Secondary | ICD-10-CM | POA: Diagnosis not present

## 2018-03-10 DIAGNOSIS — I63233 Cerebral infarction due to unspecified occlusion or stenosis of bilateral carotid arteries: Secondary | ICD-10-CM | POA: Diagnosis not present

## 2018-03-10 DIAGNOSIS — I6521 Occlusion and stenosis of right carotid artery: Secondary | ICD-10-CM

## 2018-03-10 DIAGNOSIS — R0989 Other specified symptoms and signs involving the circulatory and respiratory systems: Secondary | ICD-10-CM

## 2018-03-10 MED ORDER — IOPAMIDOL (ISOVUE-370) INJECTION 76%
75.0000 mL | Freq: Once | INTRAVENOUS | Status: AC | PRN
  Administered 2018-03-10: 75 mL via INTRAVENOUS

## 2018-03-10 NOTE — Progress Notes (Signed)
Patient ID: Todd Armstrong, male   DOB: 1931-02-27, 84 y.o.   MRN: [id number]  Reason for Consult: Carotid (3 wk f/u CTA head/neck. )   Referred by Lavone Orn, MD  Subjective:     HPI:  Todd Armstrong is a 84 y.o. male follows up with CT angios of his carotids to evaluate stenosis.  He has a known history of coronary artery disease and hyperlipidemia hypertension and diabetes and has previous coronary artery stenting.  He has a known old MCA infarct and recently had a time of disorientation but no lateralizing symptoms.  He is unsure of when the stroke actually occurred.  Since our last visit he has no new events.  He continues to take aspirin Plavix.  He walks without issue.  He is about to have his 87th birthday next week.  Past Medical History:  Diagnosis Date  . Acute myocardial infarction   . Coronary artery disease    PTCA and stenting of his right coronary artery. 3.5 x 16-mm Liberte stent.              . Diabetes mellitus without complication ([place])   . Dyslipidemia   . Hyperlipemia   . Hypertension   . Partial tear of rotator cuff    right shoulder  . Pulmonary hypertension (HCC)    moderate pulmonary HTN by 2007 echo (RVSP 52 mmHg)  . Wears dentures    Family History  Problem Relation Age of Onset  . Heart attack Father    Past Surgical History:  Procedure Laterality Date  . CARDIAC CATHETERIZATION  11/05/2008  . CARDIAC CATHETERIZATION  10/22/1993   EF 60%  . coronary stents    . FRACTURE SURGERY     righ fibula and tibia  . HERNIA REPAIR  1984  . MULTIPLE TOOTH EXTRACTIONS    . SHOULDER ARTHROSCOPY Right 06/09/2017   Procedure: ARTHROSCOPY SHOULDER SUBACROMIAL DECOMPRESSION AND ACROMIOPLASTY;  Surgeon: Melrose Nakayama, MD;  Location: [place];  Service: Orthopedics;  Laterality: Right;  GENERAL ANESTHESIA WITH BLOCK    Short Social History:  Social History   Tobacco Use  . Smoking status: Former Smoker    Types: Cigarettes  . Smokeless tobacco:  Never Used  . Tobacco comment: quit smoking cigarettes > 40 years ago  Substance Use Topics  . Alcohol use: No    Allergies  Allergen Reactions  . No Known Allergies     Current Outpatient Medications  Medication Sig Dispense Refill  . aspirin EC 81 MG tablet Take 81 mg by mouth 2 (two) times daily.     . carvedilol (COREG) 12.5 MG tablet TAKE 1 TABLET BY MOUTH TWICE DAILY WITH A MEAL 180 tablet 2  . clopidogrel (PLAVIX) 75 MG tablet Take 1 tablet (75 mg total) by mouth daily. 30 tablet 5  . lisinopril-hydrochlorothiazide (PRINZIDE,ZESTORETIC) 20-25 MG tablet TAKE 1 TABLET BY MOUTH DAILY 90 tablet 2  . metFORMIN (GLUCOPHAGE) 1000 MG tablet Take 1,000 mg by mouth 2 (two) times daily with a meal.      . nitroGLYCERIN (NITROSTAT) 0.4 MG SL tablet Place 1 tablet (0.4 mg total) under the tongue every 5 (five) minutes as needed for chest pain. 25 tablet 6  . rosuvastatin (CRESTOR) 10 MG tablet Take 1 tablet (10 mg total) by mouth daily. 90 tablet 2   No current facility-administered medications for this visit.     Review of Systems  Constitutional:  Constitutional negative. HENT: HENT negative.  Eyes: Eyes negative.  Respiratory: Respiratory negative.  Cardiovascular: Cardiovascular negative.  GI: Gastrointestinal negative.  Musculoskeletal: Musculoskeletal negative.  Skin: Skin negative.  Neurological: Positive for syncope.  Hematologic: Hematologic/lymphatic negative.  Psychiatric: Positive for confusion.        Objective:  Objective   Vitals:   03/10/18 1141 03/10/18 1143  BP: (!) 103/57 (!) 86/54  Pulse: 74   Resp: 20   Temp: (!) 97 F (36.1 C)   TempSrc: Oral   SpO2: 99%   Weight: 160 lb (72.6 kg)   Height: 5\' 8"  (1.727 m)    Body mass index is 24.33 kg/m.  Physical Exam  Constitutional: He is oriented to person, place, and time. He appears well-developed.  HENT:  Head: Normocephalic.  Eyes: Pupils are equal, round, and reactive to light.  Neck: Normal  range of motion.  Cardiovascular: Normal rate.  Pulses:      Carotid pulses are 2+ on the right side, and 2+ on the left side.      Radial pulses are 2+ on the right side, and 2+ on the left side.  Pulmonary/Chest: Effort normal.  Abdominal: Soft.  Musculoskeletal: Normal range of motion. He exhibits no edema.  Neurological: He is alert and oriented to person, place, and time.  Skin: Skin is warm and dry.  Psychiatric: He has a normal mood and affect. His behavior is normal. Judgment and thought content normal.    Data: I have independently reviewed his CT Angie of his neck and head which demonstrates greater than 90% stenosis of his right common carotid bifurcation extending into the external and internal carotid arteries.  He has a remote infarct as right MCA watershed territory.  There is a proximally 40% narrowing of the left carotid bifurcation.     Assessment/Plan:     84 year old male with history of watershed infarct of the right MCA and now greater than 9% stenosis on the right.  I discussed with him stent versus carotid endarterectomy at our last visit but given the high calcification burden of the stenosis I would actually recommend against stenting at this time.  Leaves our options as continued medical management versus endarterectomy.  I have quoted him a risk of approximately 20-25% of stroke in the future she will be pursue medical management although this may be somewhat over exaggerated given the addition of Plavix and aspirin with a statin that he takes.  He will be at risk of approximately 2% of stroke 5% risk of cranial nerve injury and we will get his cardiologist to evaluate him.  We will then proceed with carotid endarterectomy if he is a good candidate from a cardiac standpoint.     Waynetta Sandy MD [facility]

## 2018-03-16 NOTE — Pre-Procedure Instructions (Signed)
Todd Armstrong  03/16/2018      [facility] - [place], [place] - [address] [place] [address] Phone: [phone] Fax: [phone]    Your procedure is scheduled on 03/23/2018.  Report to [facility] Admitting at 0730 A.M.  Call this number if you have problems the morning of surgery:  [phone]   Remember:  Do not eat food or drink liquids after midnight.   Continue all medications as directed by your physician except follow these medication instructions before surgery below   Take these medicines the morning of surgery with A SIP OF WATER: Carvedilol (Coreg) Nitrogylcerin (Nitrostat) - if needed  7 days prior to surgery STOP taking Aleve, Naproxen, Ibuprofen, Motrin, Advil, Goody's, BC's, all herbal medications, fish oil, and all vitamins  Follow your doctors instructions regarding your Aspirin and Plavix.  If no instructions were given by your doctor, then you will need to call the prescribing office office to get instructions.    WHAT DO I DO ABOUT MY DIABETES MEDICATION?  [name] Do not take oral diabetes medicines (pills) the morning of surgery. - DO NOT take your metformin the morning of surgery  How to Manage Your Diabetes Before and After Surgery  Why is it important to control my blood sugar before and after surgery? . Improving blood sugar levels before and after surgery helps healing and can limit problems. . A way of improving blood sugar control is eating a healthy diet by: o  Eating less sugar and carbohydrates o  Increasing activity/exercise o  Talking with your doctor about reaching your blood sugar goals . High blood sugars (greater than 180 mg/dL) can raise your risk of infections and slow your recovery, so you will need to focus on controlling your diabetes during the weeks before surgery. . Make sure that the doctor who takes care of your diabetes knows about your planned  surgery including the date and location.  How do I manage my blood sugar before surgery? . Check your blood sugar at least 4 times a day, starting 2 days before surgery, to make sure that the level is not too high or low. o Check your blood sugar the morning of your surgery when you wake up and every 2 hours until you get to the Short Stay unit. . If your blood sugar is less than 70 mg/dL, you will need to treat for low blood sugar: o Do not take insulin. o Treat a low blood sugar (less than 70 mg/dL) with  cup of clear juice (cranberry or apple), 4 glucose tablets, OR glucose gel. Recheck blood sugar in 15 minutes after treatment (to make sure it is greater than 70 mg/dL). If your blood sugar is not greater than 70 mg/dL on recheck, call [phone] o  for further instructions. . Report your blood sugar to the short stay nurse when you get to Short Stay.  . If you are admitted to the hospital after surgery: o Your blood sugar will be checked by the staff and you will probably be given insulin after surgery (instead of oral diabetes medicines) to make sure you have good blood sugar levels. o The goal for blood sugar control after surgery is 80-180 mg/dL.     Do not wear jewelry  Do not wear lotions, powders, or colognes, or deodorant.  Men may shave face and neck.  Do not bring valuables to the hospital.  [facility] is not responsible for any belongings or valuables.  Hearing aids, eyeglasses, contacts, dentures or bridgework may not be worn into surgery.  Leave your suitcase in the car.  After surgery it may be brought to your room.  For patients admitted to the hospital, discharge time will be determined by your treatment team.  Patients discharged the day of surgery will not be allowed to drive home.   Name and phone number of your driver:    Special instructions:   [facility]- Preparing For Surgery  Before surgery, you can play an important role. Because skin is not sterile,  your skin needs to be as free of germs as possible. You can reduce the number of germs on your skin by washing with CHG (chlorahexidine gluconate) Soap before surgery.  CHG is an antiseptic cleaner which kills germs and bonds with the skin to continue killing germs even after washing.  Please do not use if you have an allergy to CHG or antibacterial soaps. If your skin becomes reddened/irritated stop using the CHG.  Do not shave (including legs and underarms) for at least 48 hours prior to first CHG shower. It is OK to shave your face.  Please follow these instructions carefully.   1. Shower the NIGHT BEFORE SURGERY and the MORNING OF SURGERY with CHG.   2. If you chose to wash your hair, wash your hair first as usual with your normal shampoo.  3. After you shampoo, rinse your hair and body thoroughly to remove the shampoo.  4. Use CHG as you would any other liquid soap. You can apply CHG directly to the skin and wash gently with a scrungie or a clean washcloth.   5. Apply the CHG Soap to your body ONLY FROM THE NECK DOWN.  Do not use on open wounds or open sores. Avoid contact with your eyes, ears, mouth and genitals (private parts). Wash Face and genitals (private parts)  with your normal soap.  6. Wash thoroughly, paying special attention to the area where your surgery will be performed.  7. Thoroughly rinse your body with warm water from the neck down.  8. DO NOT shower/wash with your normal soap after using and rinsing off the CHG Soap.  9. Pat yourself dry with a CLEAN TOWEL.  10. Wear CLEAN PAJAMAS to bed the night before surgery, wear comfortable clothes the morning of surgery  11. Place CLEAN SHEETS on your bed the night of your first shower and DO NOT SLEEP WITH PETS.    Day of Surgery: Shower as stated above. Do not apply any deodorants/lotions. Please wear clean clothes to the hospital/surgery center.      Please read over the following fact sheets that you were  given.

## 2018-03-17 ENCOUNTER — Encounter (HOSPITAL_COMMUNITY)
Admission: RE | Admit: 2018-03-17 | Discharge: 2018-03-17 | Disposition: A | Discharge status: Home / Self Care | Payer: Medicare Other | Source: Ambulatory Visit | Attending: Vascular Surgery | Admitting: Vascular Surgery

## 2018-03-17 ENCOUNTER — Encounter (HOSPITAL_COMMUNITY): Payer: Self-pay

## 2018-03-17 ENCOUNTER — Other Ambulatory Visit: Payer: Self-pay

## 2018-03-17 DIAGNOSIS — Z7984 Long term (current) use of oral hypoglycemic drugs: Secondary | ICD-10-CM | POA: Diagnosis not present

## 2018-03-17 DIAGNOSIS — I10 Essential (primary) hypertension: Secondary | ICD-10-CM | POA: Diagnosis not present

## 2018-03-17 DIAGNOSIS — E785 Hyperlipidemia, unspecified: Secondary | ICD-10-CM | POA: Insufficient documentation

## 2018-03-17 DIAGNOSIS — I4891 Unspecified atrial fibrillation: Secondary | ICD-10-CM | POA: Insufficient documentation

## 2018-03-17 DIAGNOSIS — E119 Type 2 diabetes mellitus without complications: Secondary | ICD-10-CM | POA: Diagnosis not present

## 2018-03-17 DIAGNOSIS — I2510 Atherosclerotic heart disease of native coronary artery without angina pectoris: Secondary | ICD-10-CM | POA: Insufficient documentation

## 2018-03-17 DIAGNOSIS — Z8673 Personal history of transient ischemic attack (TIA), and cerebral infarction without residual deficits: Secondary | ICD-10-CM | POA: Diagnosis not present

## 2018-03-17 DIAGNOSIS — Z01812 Encounter for preprocedural laboratory examination: Secondary | ICD-10-CM | POA: Diagnosis not present

## 2018-03-17 DIAGNOSIS — I252 Old myocardial infarction: Secondary | ICD-10-CM | POA: Diagnosis not present

## 2018-03-17 HISTORY — DX: Peripheral vascular disease, unspecified: I73.9

## 2018-03-17 HISTORY — DX: Occlusion and stenosis of unspecified carotid artery: I65.29

## 2018-03-17 HISTORY — DX: Cerebral infarction, unspecified: I63.9

## 2018-03-17 LAB — COMPREHENSIVE METABOLIC PANEL
ALT: 14 U/L — ABNORMAL LOW (ref 17–63)
AST: 18 U/L (ref 15–41)
Albumin: 4 g/dL (ref 3.5–5.0)
Alkaline Phosphatase: 46 U/L (ref 38–126)
Anion gap: 11 (ref 5–15)
BUN: 23 mg/dL — ABNORMAL HIGH (ref 6–20)
CO2: 27 mmol/L (ref 22–32)
Calcium: 9.2 mg/dL (ref 8.9–10.3)
Chloride: 100 mmol/L — ABNORMAL LOW (ref 101–111)
Creatinine, Ser: 1.24 mg/dL (ref 0.61–1.24)
GFR calc Af Amer: 59 mL/min — ABNORMAL LOW (ref >60)
GFR calc non Af Amer: 51 mL/min — ABNORMAL LOW (ref >60)
Glucose, Bld: 229 mg/dL — ABNORMAL HIGH (ref 65–99)
Potassium: 3.9 mmol/L (ref 3.5–5.1)
Sodium: 138 mmol/L (ref 135–145)
Total Bilirubin: 0.9 mg/dL (ref 0.3–1.2)
Total Protein: 6.4 g/dL — ABNORMAL LOW (ref 6.5–8.1)

## 2018-03-17 LAB — ABO/RH: ABO/RH(D): A POS

## 2018-03-17 LAB — URINALYSIS, ROUTINE W REFLEX MICROSCOPIC
BACTERIA UA: NONE SEEN
Bilirubin Urine: NEGATIVE
GLUCOSE, UA: NEGATIVE mg/dL
KETONES UR: NEGATIVE mg/dL
LEUKOCYTES UA: NEGATIVE
Nitrite: NEGATIVE
PH: 5 (ref 5.0–8.0)
Protein, ur: NEGATIVE mg/dL
SPECIFIC GRAVITY, URINE: 1.014 (ref 1.005–1.030)

## 2018-03-17 LAB — TYPE AND SCREEN
ABO/RH(D): A POS
Antibody Screen: NEGATIVE

## 2018-03-17 LAB — SURGICAL PCR SCREEN
MRSA, PCR: NEGATIVE
STAPHYLOCOCCUS AUREUS: NEGATIVE

## 2018-03-17 LAB — PROTIME-INR
INR: 1.05
PROTHROMBIN TIME: 13.6 s (ref 11.4–15.2)

## 2018-03-17 LAB — CBC
HCT: 38.1 % — ABNORMAL LOW (ref 39.0–52.0)
Hemoglobin: 12.4 g/dL — ABNORMAL LOW (ref 13.0–17.0)
MCH: 30.8 pg (ref 26.0–34.0)
MCHC: 32.5 g/dL (ref 30.0–36.0)
MCV: 94.8 fL (ref 78.0–100.0)
PLATELETS: 187 10*3/uL (ref 150–400)
RBC: 4.02 MIL/uL — ABNORMAL LOW (ref 4.22–5.81)
RDW: 14 % (ref 11.5–15.5)
WBC: 8.5 10*3/uL (ref 4.0–10.5)

## 2018-03-17 LAB — APTT: aPTT: 34 seconds (ref 24–36)

## 2018-03-17 LAB — HEMOGLOBIN A1C
Hgb A1c MFr Bld: 6.6 % — ABNORMAL HIGH (ref 4.8–5.6)
Mean Plasma Glucose: 142.72 mg/dL

## 2018-03-17 LAB — GLUCOSE, CAPILLARY: GLUCOSE-CAPILLARY: 193 mg/dL — AB (ref 65–99)

## 2018-03-17 NOTE — Progress Notes (Signed)
PCP - Dr. Lavone Orn Cardiologist - Dr. [name]  Chest x-ray - n/a EKG - 01/18/2018 Stress Test - 2009 ECHO - 2007 Cardiac Cath - 2009  Sleep Study - patient denies   Fasting Blood Sugar - unknown, patient does not check his CBG regularly Checks Blood Sugar 0 times a day  Blood Thinner Instructions: Per Dr. Donzetta Matters, patient to continue plavix Aspirin Instructions: Per Dr. Donzetta Matters, patient to continue ASA  Anesthesia review: yes, cardiac history  Patient denies shortness of breath, fever, cough and chest pain at PAT appointment   Patient verbalized understanding of instructions that were given to them at the PAT appointment. Patient was also instructed that they will need to review over the PAT instructions again at home before surgery.

## 2018-03-20 NOTE — Progress Notes (Addendum)
Anesthesia Chart Review: Patient is a 84 year old male scheduled for right carotid endarterectomy on 03/23/2018 by Dr. Servando Snare.  History includes former smoker, HTN, HLD, CAD/MI s/p RCA stent '09, moderate pulmonary hypertension (echo '07), hernia repair '84, right tib-fib IM nailing '05, right shoulder arthroscopy/acromiplasty 06/09/17, CVA (old right frontal lobe and basal ganglia infarcts/both potentially MCA territory infarcts by 01/27/18 head CT).     - PCP is Dr. Lavone Orn. Cardiologist is Dr. [name], last visit 01/18/18. Patient was doing well from a cardiac standpoint. Six month follow-up recommended. He was scheduled for carotid Duplex in 10/2017, but developed an episode of dizziness and disorientation without LOC. Carotid U/S was moved up and showed 70-99% RICA stenosis, worse since last evaluation. PCP also ordered a head CT (done 01/27/18) that showed evidence of two old infarcts, possibly both in the MCA territory. He was started on DAPT and was referred to [facility] by Dr. Laurann Montana.    Meds include aspirin 81 mg, Coreg, Plavix, lisinopril-HCTZ, metformin, nitro, Crestor. He is to stay on ASA and Plavix per [facility].  BP (!) 160/52   Pulse 67   Temp 36.6 C   Resp 20   Ht 5\' 10"  (1.778 m)   Wt 156 lb 6.4 oz (70.9 kg)   SpO2 98%   BMI 22.44 kg/m   EKG 01/18/18 ([facility]): Sinus rhythm with sinus arrhythmia with first-degree AV block, nonspecific intraventricular conduction delay.   Cardiac cath 11/01/08:  LM: Minor luminal irregularities. LAD: Mild-moderate irregularities between 10-20%. The ramus intermediate vessel is a small vessel with minor luminal irregularities. LCX: Occluded proximally (old). The distal marginal vessel fills late via left to left collaterals. RCA: 40% proximal 40% stenosis, followed by mid 80-90% stenosis. PDA and PLA are normal.  EF 50% with lateral akinesis. The inferior wall contracts fairly normally. 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 He has a new tight stenosis of the right coronary artery. We will schedule him for PCI of the right coronary artery. We will go and start loading him on Plavix. PCI 11/05/08: PTCA/Liberte stent X 2 RCA  Echo 07/19/06: Impression: Normal LV systolic function, EF 00-93%. There is impaired LV relaxation. Mild aortic sclerosis. Mild to moderate aortic insufficiency. Mild mitral regurgitation. Mild tricuspid regurgitation. Moderate pulmonary hypertension, RVSP 52 mmHg.  CTA neck 03/10/18: IMPRESSION: 1. Right cervical carotid bifurcation severe stenosis with at least 90% at the common carotid terminus. The plaque continues into the ECA and ICA origins with high-grade stenosis. The bifurcation is well below the mandibular angle. 2. Remote infarcts in the right MCA/watershed territory, 1 of which has become apparent since head CT 6 weeks prior. 3. 40% atheromatous narrowing at the left cervical carotid bifurcation. 4. Mild intracranial atherosclerosis without proximal flow limiting stenosis. 5. 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 6. Aortic Atherosclerosis (ICD10-I70.0) and Emphysema (ICD10-J43.9).  Carotid U/S  - 02/17/18: Final Interpretation: Right Carotid: Velocities in the right ICA are consistent with a 60-79% stenosis. The ECA appears >50% stenosed. Possible ulceration of the proximal ICA. Vertebrals: Right vertebral artery was patent with antegrade flow. Subclavians: Normal flow hemodynamics were seen in the right subclavian artery. - 02/02/18: IMPRESSION: 1. Critical (70-99%) stenosis proximal right internal carotid artery secondary to bulky and largely calcified echogenic atherosclerotic plaque. 2. Moderate (50-69%) stenosis proximal left internal carotid artery secondary to moderate heterogeneous atherosclerotic  plaque. 3.  Vertebral arteries remain patent.  Preoperative labs noted. Cr 1.24. H/H 12.4/38.1. PLT 187. PT/PTT WNL. A1c 6.6. T&S done.  UA showed negative leukocytes and nitrites.  Based on 03/10/18 office note by Dr. Donzetta Matters, right CEA was recommended if felt to be a good candidate from a cardiac standpoint. I will follow-up if he has spoken with cardiology. Patient was doing well at his 01/2018 visit. (Update: Per 03/21/18 notation by Daune Perch, NP, ".Marland KitchenMarland KitchenTherefore, based on ACC/AHA guidelines, the patient would be at acceptable risk for the planned procedure without further cardiovascular testing.")  [facility] Short Stay Center/Anesthesiology Phone [phone] 03/20/2018 4:40 PM

## 2018-03-21 ENCOUNTER — Telehealth: Payer: Self-pay

## 2018-03-21 NOTE — Telephone Encounter (Signed)
   [facility] Pre-operative Risk Assessment    Request for surgical clearance:  1. What type of surgery is being performed? Right CEA   2. When is this surgery scheduled? 03/23/2018   3. What type of clearance is required (medical clearance vs. Pharmacy clearance to hold med vs. Both)? cardiac  4. Are there any medications that need to be held prior to surgery and how long?unknown   5. Practice name and name of physician performing surgery? [facility]  Dr Servando Snare   6. What is your office phone and fax number? [phone]  [phone]   7. Anesthesia type (None, local, MAC, general) ? General   Todd Armstrong  Todd Armstrong 03/21/2018, 10:49 AM  _________________________________________________________________   (provider comments below)

## 2018-03-21 NOTE — Telephone Encounter (Signed)
   Primary Cardiologist: [name], MD  Chart reviewed as part of pre-operative protocol coverage. Patient was contacted 03/21/2018 in reference to pre-operative risk assessment for pending surgery as outlined below.  Todd Armstrong was last seen on 01/27/18 by Dr. Acie Fredrickson.  Since that day, Todd Armstrong has done well from a cardiac standpoint. He did have syncope, possible stroke and is now scheduled for carotid endarterectomy.   According to the RCRI risk calculator, Todd Armstrong is a class IV risk with approximately an 11% risk of cardiac event perioperatively due to his prior MI and recent stroke. He is active and can complete much more than 4 METs of activity. He does not have diabetes or significant renal dysfunction.   His risk was discussed with the patient and he wishes to proceed.  Therefore, based on ACC/AHA guidelines, the patient would be at acceptable risk for the planned procedure without further cardiovascular testing.   We will be available for consult if needed during his hospitalization.  I will route this recommendation to the requesting party via Epic fax function and remove from pre-op pool.  Please call with questions.  Daune Perch, NP 03/21/2018, 2:50 PM

## 2018-03-21 NOTE — Telephone Encounter (Signed)
Spoke with Zigmund Daniel at [facility] who advised patient will continue Plavix for carotid endarterectomy

## 2018-03-23 ENCOUNTER — Encounter (HOSPITAL_COMMUNITY): Payer: Self-pay | Admitting: *Deleted

## 2018-03-23 ENCOUNTER — Inpatient Hospital Stay (HOSPITAL_COMMUNITY): Payer: Medicare Other | Admitting: Certified Registered"

## 2018-03-23 ENCOUNTER — Inpatient Hospital Stay (HOSPITAL_COMMUNITY)
Admission: RE | Admit: 2018-03-23 | Discharge: 2018-03-24 | DRG: 039 | Disposition: A | Discharge status: Home / Self Care | Payer: Medicare Other | Source: Ambulatory Visit | Attending: Vascular Surgery | Admitting: Vascular Surgery

## 2018-03-23 ENCOUNTER — Inpatient Hospital Stay (HOSPITAL_COMMUNITY): Payer: Medicare Other | Admitting: Emergency Medicine

## 2018-03-23 ENCOUNTER — Encounter (HOSPITAL_COMMUNITY)
Admission: RE | Disposition: A | Discharge status: Home / Self Care | Payer: Self-pay | Source: Ambulatory Visit | Attending: Vascular Surgery

## 2018-03-23 ENCOUNTER — Other Ambulatory Visit: Payer: Self-pay

## 2018-03-23 DIAGNOSIS — I252 Old myocardial infarction: Secondary | ICD-10-CM | POA: Diagnosis not present

## 2018-03-23 DIAGNOSIS — Z87891 Personal history of nicotine dependence: Secondary | ICD-10-CM

## 2018-03-23 DIAGNOSIS — I2510 Atherosclerotic heart disease of native coronary artery without angina pectoris: Secondary | ICD-10-CM | POA: Diagnosis present

## 2018-03-23 DIAGNOSIS — I10 Essential (primary) hypertension: Secondary | ICD-10-CM | POA: Diagnosis present

## 2018-03-23 DIAGNOSIS — I6521 Occlusion and stenosis of right carotid artery: Secondary | ICD-10-CM | POA: Diagnosis not present

## 2018-03-23 DIAGNOSIS — Z7984 Long term (current) use of oral hypoglycemic drugs: Secondary | ICD-10-CM

## 2018-03-23 DIAGNOSIS — Z7902 Long term (current) use of antithrombotics/antiplatelets: Secondary | ICD-10-CM | POA: Diagnosis not present

## 2018-03-23 DIAGNOSIS — Z7982 Long term (current) use of aspirin: Secondary | ICD-10-CM

## 2018-03-23 DIAGNOSIS — E785 Hyperlipidemia, unspecified: Secondary | ICD-10-CM | POA: Diagnosis not present

## 2018-03-23 DIAGNOSIS — E1151 Type 2 diabetes mellitus with diabetic peripheral angiopathy without gangrene: Secondary | ICD-10-CM | POA: Diagnosis not present

## 2018-03-23 DIAGNOSIS — Z955 Presence of coronary angioplasty implant and graft: Secondary | ICD-10-CM

## 2018-03-23 DIAGNOSIS — Z8249 Family history of ischemic heart disease and other diseases of the circulatory system: Secondary | ICD-10-CM

## 2018-03-23 DIAGNOSIS — Z8673 Personal history of transient ischemic attack (TIA), and cerebral infarction without residual deficits: Secondary | ICD-10-CM

## 2018-03-23 HISTORY — PX: CAROTID ENDARTERECTOMY: SUR193

## 2018-03-23 HISTORY — PX: ENDARTERECTOMY: SHX5162

## 2018-03-23 HISTORY — PX: PATCH ANGIOPLASTY: SHX6230

## 2018-03-23 LAB — GLUCOSE, CAPILLARY
GLUCOSE-CAPILLARY: 109 mg/dL — AB (ref 65–99)
GLUCOSE-CAPILLARY: 116 mg/dL — AB (ref 65–99)
Glucose-Capillary: 162 mg/dL — ABNORMAL HIGH (ref 65–99)
Glucose-Capillary: 199 mg/dL — ABNORMAL HIGH (ref 65–99)

## 2018-03-23 LAB — POCT ACTIVATED CLOTTING TIME: Activated Clotting Time: 257 seconds

## 2018-03-23 SURGERY — ENDARTERECTOMY, CAROTID
Anesthesia: General | Site: Neck | Laterality: Right

## 2018-03-23 MED ORDER — PHENYLEPHRINE HCL 10 MG/ML IJ SOLN
INTRAVENOUS | Status: DC | PRN
  Administered 2018-03-23: 15 ug/min via INTRAVENOUS

## 2018-03-23 MED ORDER — LISINOPRIL-HYDROCHLOROTHIAZIDE 20-25 MG PO TABS: 1.0000 | ORAL_TABLET | Freq: Every day | ORAL | Status: DC

## 2018-03-23 MED ORDER — ACETAMINOPHEN 325 MG PO TABS: 325.0000 mg | ORAL_TABLET | ORAL | Status: DC | PRN

## 2018-03-23 MED ORDER — SODIUM CHLORIDE 0.9 % IV SOLN: INTRAVENOUS | Status: DC

## 2018-03-23 MED ORDER — INSULIN ASPART 100 UNIT/ML ~~LOC~~ SOLN
0.0000 [IU] | Freq: Three times a day (TID) | SUBCUTANEOUS | Status: DC
  Administered 2018-03-24: 2 [IU] via SUBCUTANEOUS

## 2018-03-23 MED ORDER — OXYCODONE HCL 5 MG/5ML PO SOLN: 5.0000 mg | Freq: Once | ORAL | Status: DC | PRN

## 2018-03-23 MED ORDER — POTASSIUM CHLORIDE CRYS ER 20 MEQ PO TBCR: 20.0000 meq | EXTENDED_RELEASE_TABLET | Freq: Every day | ORAL | Status: DC | PRN

## 2018-03-23 MED ORDER — DOCUSATE SODIUM 100 MG PO CAPS
100.0000 mg | ORAL_CAPSULE | Freq: Every day | ORAL | Status: DC
  Filled 2018-03-23: qty 1

## 2018-03-23 MED ORDER — SODIUM CHLORIDE 0.9 % IV SOLN
500.0000 mL | Freq: Once | INTRAVENOUS | Status: AC | PRN
  Administered 2018-03-23: 14:00:00 via INTRAVENOUS

## 2018-03-23 MED ORDER — ONDANSETRON HCL 4 MG/2ML IJ SOLN: 4.0000 mg | Freq: Four times a day (QID) | INTRAMUSCULAR | Status: DC | PRN

## 2018-03-23 MED ORDER — HYDROCHLOROTHIAZIDE 25 MG PO TABS
25.0000 mg | ORAL_TABLET | Freq: Every day | ORAL | Status: DC
  Administered 2018-03-24: 25 mg via ORAL
  Filled 2018-03-23: qty 1

## 2018-03-23 MED ORDER — ROCURONIUM BROMIDE 100 MG/10ML IV SOLN: INTRAVENOUS | Status: DC | PRN

## 2018-03-23 MED ORDER — LIDOCAINE HCL (CARDIAC) 20 MG/ML IV SOLN: INTRAVENOUS | Status: DC | PRN

## 2018-03-23 MED ORDER — 0.9 % SODIUM CHLORIDE (POUR BTL) OPTIME
TOPICAL | Status: DC | PRN
  Administered 2018-03-23: 3000 mL

## 2018-03-23 MED ORDER — OXYCODONE-ACETAMINOPHEN 5-325 MG PO TABS: 1.0000 | ORAL_TABLET | ORAL | Status: DC | PRN

## 2018-03-23 MED ORDER — LABETALOL HCL 5 MG/ML IV SOLN: 10.0000 mg | INTRAVENOUS | Status: DC | PRN

## 2018-03-23 MED ORDER — FENTANYL CITRATE (PF) 100 MCG/2ML IJ SOLN: 25.0000 ug | INTRAMUSCULAR | Status: DC | PRN

## 2018-03-23 MED ORDER — ONDANSETRON HCL 4 MG/2ML IJ SOLN
INTRAMUSCULAR | Status: DC | PRN
  Administered 2018-03-23: 4 mg via INTRAVENOUS

## 2018-03-23 MED ORDER — PROTAMINE SULFATE 10 MG/ML IV SOLN
INTRAVENOUS | Status: AC
  Filled 2018-03-23: qty 5

## 2018-03-23 MED ORDER — GUAIFENESIN-DM 100-10 MG/5ML PO SYRP: 15.0000 mL | ORAL_SOLUTION | ORAL | Status: DC | PRN

## 2018-03-23 MED ORDER — LISINOPRIL 10 MG PO TABS
20.0000 mg | ORAL_TABLET | Freq: Every day | ORAL | Status: DC
Start: 2018-03-23 — End: 2018-03-24
  Administered 2018-03-24: 20 mg via ORAL
  Filled 2018-03-23: qty 2

## 2018-03-23 MED ORDER — ALUM & MAG HYDROXIDE-SIMETH 200-200-20 MG/5ML PO SUSP: 15.0000 mL | ORAL | Status: DC | PRN

## 2018-03-23 MED ORDER — FENTANYL CITRATE (PF) 250 MCG/5ML IJ SOLN
INTRAMUSCULAR | Status: AC
  Filled 2018-03-23: qty 5

## 2018-03-23 MED ORDER — PROTAMINE SULFATE 10 MG/ML IV SOLN
INTRAVENOUS | Status: DC | PRN
  Administered 2018-03-23: 50 mg via INTRAVENOUS

## 2018-03-23 MED ORDER — CHLORHEXIDINE GLUCONATE CLOTH 2 % EX PADS: 6.0000 | MEDICATED_PAD | Freq: Once | CUTANEOUS | Status: DC

## 2018-03-23 MED ORDER — SODIUM CHLORIDE 0.9 % IV SOLN
INTRAVENOUS | Status: DC
  Administered 2018-03-24: 02:00:00 via INTRAVENOUS

## 2018-03-23 MED ORDER — NITROGLYCERIN 0.4 MG SL SUBL: 0.4000 mg | SUBLINGUAL_TABLET | SUBLINGUAL | Status: DC | PRN

## 2018-03-23 MED ORDER — SODIUM CHLORIDE 0.9 % IV SOLN
INTRAVENOUS | Status: AC
  Filled 2018-03-23: qty 1.2

## 2018-03-23 MED ORDER — LACTATED RINGERS IV SOLN
INTRAVENOUS | Status: DC | PRN
  Administered 2018-03-23: 11:00:00 via INTRAVENOUS

## 2018-03-23 MED ORDER — LACTATED RINGERS IV SOLN
INTRAVENOUS | Status: DC
  Administered 2018-03-23 (×2): via INTRAVENOUS

## 2018-03-23 MED ORDER — OXYCODONE HCL 5 MG PO TABS: 5.0000 mg | ORAL_TABLET | Freq: Once | ORAL | Status: DC | PRN

## 2018-03-23 MED ORDER — ENSURE ENLIVE PO LIQD
237.0000 mL | Freq: Two times a day (BID) | ORAL | Status: DC
  Administered 2018-03-23 – 2018-03-24 (×2): 237 mL via ORAL

## 2018-03-23 MED ORDER — SUGAMMADEX SODIUM 200 MG/2ML IV SOLN
INTRAVENOUS | Status: DC | PRN
  Administered 2018-03-23: 150 mg via INTRAVENOUS

## 2018-03-23 MED ORDER — METOPROLOL TARTRATE 5 MG/5ML IV SOLN: 2.0000 mg | INTRAVENOUS | Status: DC | PRN

## 2018-03-23 MED ORDER — ASPIRIN EC 81 MG PO TBEC
81.0000 mg | DELAYED_RELEASE_TABLET | Freq: Two times a day (BID) | ORAL | Status: DC
  Administered 2018-03-24: 81 mg via ORAL
  Filled 2018-03-23: qty 1

## 2018-03-23 MED ORDER — ACETAMINOPHEN 325 MG RE SUPP: 325.0000 mg | RECTAL | Status: DC | PRN

## 2018-03-23 MED ORDER — LIDOCAINE 2% (20 MG/ML) 5 ML SYRINGE
INTRAMUSCULAR | Status: DC | PRN
  Administered 2018-03-23: 60 mg via INTRAVENOUS

## 2018-03-23 MED ORDER — MIDAZOLAM HCL 2 MG/2ML IJ SOLN
INTRAMUSCULAR | Status: AC
  Filled 2018-03-23: qty 2

## 2018-03-23 MED ORDER — OXYCODONE-ACETAMINOPHEN 5-325 MG PO TABS: 1.0000 | ORAL_TABLET | Freq: Three times a day (TID) | ORAL | 0 refills | Status: DC | PRN

## 2018-03-23 MED ORDER — SODIUM CHLORIDE 0.9 % IV SOLN
0.0125 ug/kg/min | INTRAVENOUS | Status: AC
  Administered 2018-03-23: .2 ug/kg/min via INTRAVENOUS
  Filled 2018-03-23: qty 2000

## 2018-03-23 MED ORDER — HEMOSTATIC AGENTS (NO CHARGE) OPTIME
TOPICAL | Status: DC | PRN
  Administered 2018-03-23: 1 via TOPICAL

## 2018-03-23 MED ORDER — MORPHINE SULFATE (PF) 2 MG/ML IV SOLN: 2.0000 mg | INTRAVENOUS | Status: DC | PRN

## 2018-03-23 MED ORDER — METFORMIN HCL 500 MG PO TABS
1000.0000 mg | ORAL_TABLET | Freq: Two times a day (BID) | ORAL | Status: DC
  Administered 2018-03-23 – 2018-03-24 (×2): 1000 mg via ORAL
  Filled 2018-03-23 (×2): qty 2

## 2018-03-23 MED ORDER — ROSUVASTATIN CALCIUM 10 MG PO TABS
10.0000 mg | ORAL_TABLET | Freq: Every evening | ORAL | Status: DC
  Administered 2018-03-23: 10 mg via ORAL
  Filled 2018-03-23: qty 1

## 2018-03-23 MED ORDER — PROPOFOL 10 MG/ML IV BOLUS
INTRAVENOUS | Status: DC | PRN
  Administered 2018-03-23: 20 mg via INTRAVENOUS
  Administered 2018-03-23: 30 mg via INTRAVENOUS

## 2018-03-23 MED ORDER — PROTAMINE SULFATE 10 MG/ML IV SOLN
INTRAVENOUS | Status: AC
  Filled 2018-03-23: qty 15

## 2018-03-23 MED ORDER — CEFAZOLIN SODIUM-DEXTROSE 2-4 GM/100ML-% IV SOLN
2.0000 g | Freq: Three times a day (TID) | INTRAVENOUS | Status: AC
  Administered 2018-03-23 – 2018-03-24 (×2): 2 g via INTRAVENOUS
  Filled 2018-03-23 (×3): qty 100

## 2018-03-23 MED ORDER — GLYCOPYRROLATE 0.2 MG/ML IJ SOLN
INTRAMUSCULAR | Status: DC | PRN
  Administered 2018-03-23: 0.2 mg via INTRAVENOUS

## 2018-03-23 MED ORDER — ROCURONIUM BROMIDE 10 MG/ML (PF) SYRINGE
PREFILLED_SYRINGE | INTRAVENOUS | Status: DC | PRN
  Administered 2018-03-23: 50 mg via INTRAVENOUS

## 2018-03-23 MED ORDER — MAGNESIUM SULFATE 2 GM/50ML IV SOLN: 2.0000 g | Freq: Every day | INTRAVENOUS | Status: DC | PRN

## 2018-03-23 MED ORDER — POLYETHYLENE GLYCOL 3350 17 G PO PACK
17.0000 g | PACK | Freq: Every day | ORAL | Status: DC | PRN
Start: 2018-03-23 — End: 2018-03-24

## 2018-03-23 MED ORDER — PHENOL 1.4 % MT LIQD: 1.0000 | OROMUCOSAL | Status: DC | PRN

## 2018-03-23 MED ORDER — PANTOPRAZOLE SODIUM 40 MG PO TBEC
40.0000 mg | DELAYED_RELEASE_TABLET | Freq: Every day | ORAL | Status: DC
  Administered 2018-03-23 – 2018-03-24 (×2): 40 mg via ORAL
  Filled 2018-03-23 (×2): qty 1

## 2018-03-23 MED ORDER — ACETAMINOPHEN 160 MG/5ML PO SOLN: 325.0000 mg | ORAL | Status: DC | PRN

## 2018-03-23 MED ORDER — HYDRALAZINE HCL 20 MG/ML IJ SOLN: 5.0000 mg | INTRAMUSCULAR | Status: DC | PRN

## 2018-03-23 MED ORDER — BISACODYL 10 MG RE SUPP: 10.0000 mg | Freq: Every day | RECTAL | Status: DC | PRN

## 2018-03-23 MED ORDER — CEFAZOLIN SODIUM-DEXTROSE 2-4 GM/100ML-% IV SOLN
2.0000 g | INTRAVENOUS | Status: AC
  Administered 2018-03-23: 2 g via INTRAVENOUS

## 2018-03-23 MED ORDER — HEPARIN SODIUM (PORCINE) 1000 UNIT/ML IJ SOLN
INTRAMUSCULAR | Status: DC | PRN
  Administered 2018-03-23: 8000 [IU] via INTRAVENOUS

## 2018-03-23 MED ORDER — SODIUM CHLORIDE 0.9 % IV SOLN
INTRAVENOUS | Status: DC | PRN
  Administered 2018-03-23: 500 mL

## 2018-03-23 SURGICAL SUPPLY — 59 items
ADH SKN CLS APL DERMABOND .7 (GAUZE/BANDAGES/DRESSINGS) ×1
ADPR TBG 2 MALE LL ART (MISCELLANEOUS)
CANISTER SUCT 3000ML PPV (MISCELLANEOUS) ×2 IMPLANT
CATH ROBINSON RED A/P 18FR (CATHETERS) ×2 IMPLANT
CLIP VESOCCLUDE MED 24/CT (CLIP) ×2 IMPLANT
CLIP VESOCCLUDE SM WIDE 24/CT (CLIP) ×2 IMPLANT
COVER PROBE W GEL 5X96 (DRAPES) IMPLANT
CRADLE DONUT ADULT HEAD (MISCELLANEOUS) ×2 IMPLANT
DERMABOND ADVANCED (GAUZE/BANDAGES/DRESSINGS) ×1
DERMABOND ADVANCED .7 DNX12 (GAUZE/BANDAGES/DRESSINGS) ×1 IMPLANT
DRAIN CHANNEL 15F RND FF W/TCR (WOUND CARE) IMPLANT
ELECT REM PT RETURN 9FT ADLT (ELECTROSURGICAL) ×2
ELECTRODE REM PT RTRN 9FT ADLT (ELECTROSURGICAL) ×1 IMPLANT
EVACUATOR SILICONE 100CC (DRAIN) IMPLANT
GLOVE BIO SURGEON STRL SZ7.5 (GLOVE) ×2 IMPLANT
GLOVE BIOGEL M 6.5 STRL (GLOVE) ×2 IMPLANT
GLOVE BIOGEL PI IND STRL 6.5 (GLOVE) IMPLANT
GLOVE BIOGEL PI IND STRL 7.0 (GLOVE) IMPLANT
GLOVE BIOGEL PI INDICATOR 6.5 (GLOVE) ×2
GLOVE BIOGEL PI INDICATOR 7.0 (GLOVE) ×1
GLOVE ECLIPSE 6.5 STRL STRAW (GLOVE) ×3 IMPLANT
GOWN STRL NON-REIN LRG LVL3 (GOWN DISPOSABLE) ×1 IMPLANT
GOWN STRL REUS W/ TWL LRG LVL3 (GOWN DISPOSABLE) ×2 IMPLANT
GOWN STRL REUS W/ TWL XL LVL3 (GOWN DISPOSABLE) ×1 IMPLANT
GOWN STRL REUS W/TWL LRG LVL3 (GOWN DISPOSABLE) ×10
GOWN STRL REUS W/TWL XL LVL3 (GOWN DISPOSABLE) ×2
HEMOSTAT SNOW SURGICEL 2X4 (HEMOSTASIS) ×1 IMPLANT
INSERT FOGARTY SM (MISCELLANEOUS) ×1 IMPLANT
IV ADAPTER SYR DOUBLE MALE LL (MISCELLANEOUS) IMPLANT
KIT BASIN OR (CUSTOM PROCEDURE TRAY) ×2 IMPLANT
KIT SHUNT ARGYLE CAROTID ART 6 (VASCULAR PRODUCTS) ×1 IMPLANT
KIT TURNOVER KIT B (KITS) ×2 IMPLANT
NDL HYPO 25GX1X1/2 BEV (NEEDLE) IMPLANT
NDL SPNL 20GX3.5 QUINCKE YW (NEEDLE) IMPLANT
NEEDLE HYPO 25GX1X1/2 BEV (NEEDLE) IMPLANT
NEEDLE SPNL 20GX3.5 QUINCKE YW (NEEDLE) IMPLANT
NS IRRIG 1000ML POUR BTL (IV SOLUTION) ×6 IMPLANT
PACK CAROTID (CUSTOM PROCEDURE TRAY) ×2 IMPLANT
PAD ARMBOARD 7.5X6 YLW CONV (MISCELLANEOUS) ×4 IMPLANT
PATCH VASC XENOSURE 1CMX6CM (Vascular Products) ×2 IMPLANT
PATCH VASC XENOSURE 1X6 (Vascular Products) IMPLANT
SHUNT CAROTID BYPASS 10 (VASCULAR PRODUCTS) IMPLANT
SHUNT CAROTID BYPASS 12FRX15.5 (VASCULAR PRODUCTS) IMPLANT
STOPCOCK 4 WAY LG BORE MALE ST (IV SETS) IMPLANT
SUT ETHILON 3 0 PS 1 (SUTURE) IMPLANT
SUT MNCRL AB 4-0 PS2 18 (SUTURE) ×2 IMPLANT
SUT PROLENE 5 0 C 1 24 (SUTURE) ×1 IMPLANT
SUT PROLENE 6 0 BV (SUTURE) ×7 IMPLANT
SUT PROLENE 7 0 BV1 MDA (SUTURE) ×1 IMPLANT
SUT SILK 3 0 (SUTURE)
SUT SILK 3-0 18XBRD TIE 12 (SUTURE) IMPLANT
SUT VIC AB 2-0 CT1 27 (SUTURE) ×2
SUT VIC AB 2-0 CT1 TAPERPNT 27 (SUTURE) ×1 IMPLANT
SUT VIC AB 3-0 SH 27 (SUTURE) ×2
SUT VIC AB 3-0 SH 27X BRD (SUTURE) ×1 IMPLANT
SYR CONTROL 10ML LL (SYRINGE) IMPLANT
TOWEL GREEN STERILE (TOWEL DISPOSABLE) ×2 IMPLANT
TUBING ART PRESS 48 MALE/FEM (TUBING) IMPLANT
WATER STERILE IRR 1000ML POUR (IV SOLUTION) ×2 IMPLANT

## 2018-03-23 NOTE — Transfer of Care (Signed)
Immediate Anesthesia Transfer of Care Note  Patient: Todd Armstrong  Procedure(s) Performed: RIGHT CAROTID ARTERY ENDARTERECTOMY (Right Neck) WITH XENOSURE 1CM X 6CM PATCH ANGIOPLASTY (Right Neck)  Patient Location: PACU  Anesthesia Type:General  Level of Consciousness: awake, alert , oriented and patient cooperative  Airway & Oxygen Therapy: Patient Spontanous Breathing and Patient connected to face mask oxygen  Post-op Assessment: Report given to RN, Post -op Vital signs reviewed and stable and Patient moving all extremities  Post vital signs: Reviewed and stable  Last Vitals:  Vitals Value Taken Time  BP 105/57 03/23/2018 12:58 PM  Temp    Pulse 59 03/23/2018  1:01 PM  Resp 15 03/23/2018  1:01 PM  SpO2 100 % 03/23/2018  1:01 PM  Vitals shown include unvalidated device data.  Last Pain:  Vitals:   03/23/18 0706  TempSrc:   PainSc: 0-No pain      Patients Stated Pain Goal: 3 (03/54/65 6812)  Complications: No apparent anesthesia complications

## 2018-03-23 NOTE — Op Note (Signed)
Patient name: EULA MAZZOLA MRN: [id number] DOB: [birth year] Sex: male  03/23/2018 Pre-operative Diagnosis: asymptomatic right carotid stenosis Post-operative diagnosis:  Same Surgeon:  Erlene Quan C. Donzetta Matters, MD Assistant: Leontine Locket, PA Procedure Performed:  Right carotid endarterectomy with bovine pericardial patch angioplasty  Indications: 84 year old male has a history of questionable stroke and then later had an MRI which demonstrated a right MCA distribution stroke of undetermined time frame.  He was also found to have a high-grade stenosis on the right distal common carotid artery extending into his internal carotid artery.  For this reason with his questionable symptoms  we discussed carotid endarterectomy and he now presents for that procedure today.  Findings: There is high-grade stenosis that was heavily calcified right at the carotid bifurcation.  There was soft plaque extending proximally distally all the way to the extent of our arteriotomy.  Distally I cannot get the plaque to fully feather and on the medial aspect I had to place tacking sutures.  I completion I also had to repair the distal suture line with a pledgeted stitch.  There was some redundancy in the carotid as well that we made up with our suture line.  Following all repairs at completion there was flow throughout diastole in the internal carotid artery.  Patient awake and neurologically intact.   Procedure:  The patient was identified in the holding area and taken to the OR and placed supine on the operating table and general anesthesia was induced.  He was sterilely prepped and draped in the neck and chest and given antibiotics and timeout was called.  We began with incision along the anterior border of the sternocleidomastoid dissected through the platysma soft tissue down to the level of the common carotid artery and placed an umbilical tape around this and the patient was heparinized.  ACT did return greater than 250.   We then dissected up onto the external carotid artery placed a vessel loop around this as well as a tie around the superior thyroidal branch.  We then dissected out the facial vein and identified our hypoglossal nerve.  Fascia vein was then divided between ties.  This exposed our internal carotid artery which we traced up for several centimeters protecting our hypoglossal nerve.  Where the artery appeared normal we then placed a vessel loop around this.  We prepared a bovine pericardial patch as well as a 10 [place] shunt which was marked with a tie in this midpoint.  Her blood pressure was confirmed to be a map of around 90 we then clamped our internal followed by common and external carotid arteries.  We opened the artery with 11 blade followed by Potts scissors proximally and distally to the heavily calcified lesion.  We then placed our 10 [place] shunt distally allowed to backbleed and placed approximately and confirmed flow with Doppler.  We then performed endarterectomy of the common into the external with eversion and onto the internal.  Distally I could not get it to feather and ultimately had to trim it.  There was one aspect on the medial side of the carotid that I could not fully get to lay down flat and was getting quite high on the internal carotid and so I tacked this with 2 tacking sutures that were 7-0 Prolene.  I then prepared a bovine pericardial patch and sewed in place with 6-0 Prolene suture.  Prior to completing our anastomosis we removed our shunt and flushed from all directions and then irrigated with heparinized  saline.  Upon completion I then released the external carotid clamp and had bleeding cephalad.  I attempted to repair this with a single U stitch.  I then released our common carotid clamp followed by internal and did have significant bleeding there but it was much improved from the U stitch.  I confirmed flow with Doppler I was continuous throughout diastole in the internal carotid  artery but I still had some bleeding and had to placed a pledgeted stitch at the most cephalad aspect of our suture line.  Again I confirmed with Doppler flow throughout diastole and satisfied the patient was given 50 mg of protamine which he tolerated well.  We then irrigated the wound and obtained hemostasis and closed in 2 layers with 2-0 Vicryl followed by 4-0 Monocryl and Dermabond the level of the skin.  Patient was Y from anesthesia having tolerated procedure well without immediate comp occasion.  All counts were correct at completion.  EBL 100 cc.   Brandon C. Donzetta Matters, MD [facility] Office: [phone] Pager: [phone]

## 2018-03-23 NOTE — Anesthesia Procedure Notes (Signed)
Procedure Name: Intubation Date/Time: 03/23/2018 10:46 AM Performed by: Josie Dixon, CRNA Pre-anesthesia Checklist: Patient identified, Emergency Drugs available, Suction available and Patient being monitored Patient Re-evaluated:Patient Re-evaluated prior to induction Oxygen Delivery Method: Circle system utilized Preoxygenation: Pre-oxygenation with 100% oxygen Induction Type: IV induction Ventilation: Mask ventilation throughout procedure and Mask ventilation without difficulty Laryngoscope Size: Mac and 3 Grade View: Grade I Tube type: Oral Number of attempts: 1 Airway Equipment and Method: Stylet Placement Confirmation: ETT inserted through vocal cords under direct vision,  positive ETCO2 and breath sounds checked- equal and bilateral Secured at: 22 cm Tube secured with: Tape Dental Injury: Teeth and Oropharynx as per pre-operative assessment

## 2018-03-23 NOTE — Anesthesia Preprocedure Evaluation (Addendum)
Anesthesia Evaluation  Patient identified by MRN, date of birth, ID band Patient awake    Reviewed: Allergy & Precautions, NPO status , Patient's Chart, lab work & pertinent test results, reviewed documented beta blocker date and time   History of Anesthesia Complications Negative for: history of anesthetic complications  Airway Mallampati: II  TM Distance: >3 FB Neck ROM: Full    Dental  (+) Partial Upper, Partial Lower   Pulmonary neg shortness of breath, neg sleep apnea, neg COPD, neg recent URI, former smoker,    breath sounds clear to auscultation       Cardiovascular hypertension, Pt. on medications and Pt. on home beta blockers + CAD, + Past MI, + Cardiac Stents and + Peripheral Vascular Disease   Rhythm:Regular     Neuro/Psych CVA, No Residual Symptoms    GI/Hepatic negative GI ROS, Neg liver ROS,   Endo/Other  diabetes, Oral Hypoglycemic Agents  Renal/GU negative Renal ROS     Musculoskeletal   Abdominal   Peds  Hematology negative hematology ROS (+)   Anesthesia Other Findings History includes former smoker, HTN, HLD, CAD/MIs/p RCA stent '09,moderate pulmonary hypertension (echo '07),hernia repair'84, right tib-fib IM nailing '05, right shoulder arthroscopy/acromiplasty 06/09/17, CVA (old right frontal lobe and basal ganglia infarcts/both potentially MCA territory infarcts by 01/27/18 head CT).    - PCP is Dr. Lavone Orn. Cardiologist is Dr. [name], last visit 01/18/18. Patient was doing well from a cardiac standpoint. Six month follow-up recommended. He was scheduled for carotid Duplex in 10/2017, but developed an episode of dizziness and disorientation without LOC. Carotid U/S was moved up and showed 70-99% RICA stenosis, worse since last evaluation. PCP also ordered a head CT (done 01/27/18) that showed evidence of two old infarcts, possibly both in the MCA territory. He was started on DAPT and was  referred to [facility] by Dr. Laurann Montana.    Meds include aspirin 81 mg, Coreg, Plavix, lisinopril-HCTZ, metformin, nitro, Crestor. He is to stay on ASA and Plavix per [facility].  BP (!) 160/52   Pulse 67   Temp 36.6 C   Resp 20   Ht 5\' 10"  (1.778 m)   Wt 156 lb 6.4 oz (70.9 kg)   SpO2 98%   BMI 22.44 kg/m   EKG 01/18/18 ([facility]): Sinus rhythm with sinus arrhythmia with first-degree AV block, nonspecific intraventricular conduction delay.   Cardiac cath 11/01/08:  LM: Minorluminal irregularities. LAD: Mild-moderate irregularities between 10-20%. The ramus intermediate vessel is a small vessel with minor luminal irregularities. LCX: Occluded proximally (old). The distal marginal vessel fills late via left to left collaterals. RCA: 40% proximal 40% stenosis, followed by mid 80-90% stenosis. PDA and PLA are normal.  EF 50% with lateral akinesis. The inferior wall contracts fairly normally. 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 He has a new tight stenosis of the right coronary artery. We will schedule him for PCI of the right coronary artery. We will go and start loading him on Plavix. PCI 11/05/08: PTCA/Liberte stent X 2 RCA  Echo 07/19/06: Impression: Normal LV systolic function, EF 16-10%. There is impaired LV relaxation. Mild aortic sclerosis. Mild to moderate aortic insufficiency. Mild mitral regurgitation. Mild tricuspid regurgitation. Moderate pulmonary hypertension, RVSP 52 mmHg.  CTA neck 03/10/18: IMPRESSION: 1. Right cervical carotid bifurcation severe stenosis with at least 90% at the common carotid terminus. The plaque continues into the ECA and ICA origins with high-grade stenosis. The bifurcation is  well below the mandibular angle. 2. Remote infarcts in the right MCA/watershed territory, 1 of which has become apparent since head CT 6 weeks prior. 3. 40% atheromatous narrowing at the  left cervical carotid bifurcation. 4. Mild intracranial atherosclerosis without proximal flow limiting stenosis. 5. 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 6. Aortic Atherosclerosis (ICD10-I70.0) and Emphysema (ICD10-J43.9).  Carotid U/S  - 02/17/18: Final Interpretation: Right Carotid: Velocities in the right ICA are consistent with a 60-79% stenosis. The ECA appears >50% stenosed. Possible ulceration of the proximal ICA. Vertebrals: Right vertebral artery was patent with antegrade flow. Subclavians: Normal flow hemodynamics were seen in the right subclavian artery. - 02/02/18: IMPRESSION: 1. Critical (70-99%) stenosis proximal right internal carotid artery secondary to bulky and largely calcified echogenic atherosclerotic plaque. 2. Moderate (50-69%) stenosis proximal left internal carotid artery secondary to moderate heterogeneous atherosclerotic plaque. 3. Vertebral arteries remain patent.  Preoperative labs noted. Cr 1.24. H/H 12.4/38.1. PLT 187. PT/PTT WNL. A1c 6.6. T&S done.  UA showed negative leukocytes and nitrites.  History includes former smoker, HTN, HLD, CAD/MIs/p RCA stent '09,moderate pulmonary hypertension (echo '07),hernia repair'84, right tib-fib IM nailing '05, right shoulder arthroscopy/acromiplasty 06/09/17, CVA (old right frontal lobe and basal ganglia infarcts/both potentially MCA territory infarcts by 01/27/18 head CT).    - PCP is Dr. Lavone Orn. Cardiologist is Dr. [name], last visit 01/18/18. Patient was doing well from a cardiac standpoint. Six month follow-up recommended. He was scheduled for carotid Duplex in 10/2017, but developed an episode of dizziness and disorientation without LOC. Carotid U/S was moved up and showed 70-99% RICA stenosis, worse since last evaluation. PCP also ordered a head CT (done 01/27/18) that showed evidence of two old infarcts, possibly both in the MCA  territory. He was started on DAPT and was referred to [facility] by Dr. Laurann Montana.    Meds include aspirin 81 mg, Coreg, Plavix, lisinopril-HCTZ, metformin, nitro, Crestor. He is to stay on ASA and Plavix per [facility].  BP (!) 160/52   Pulse 67   Temp 36.6 C   Resp 20   Ht 5\' 10"  (1.778 m)   Wt 156 lb 6.4 oz (70.9 kg)   SpO2 98%   BMI 22.44 kg/m   EKG 01/18/18 ([facility]): Sinus rhythm with sinus arrhythmia with first-degree AV block, nonspecific intraventricular conduction delay.   Cardiac cath 11/01/08:  LM: Minorluminal irregularities. LAD: Mild-moderate irregularities between 10-20%. The ramus intermediate vessel is a small vessel with minor luminal irregularities. LCX: Occluded proximally (old). The distal marginal vessel fills late via left to left collaterals. RCA: 40% proximal 40% stenosis, followed by mid 80-90% stenosis. PDA and PLA are normal.  EF 50% with lateral akinesis. The inferior wall contracts fairly normally. 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 He has a new tight stenosis of the right coronary artery. We will schedule him for PCI of the right coronary artery. We will go and start loading him on Plavix. PCI 11/05/08: PTCA/Liberte stent X 2 RCA  Echo 07/19/06: Impression: Normal LV systolic function, EF 86-57%. There is impaired LV relaxation. Mild aortic sclerosis. Mild to moderate aortic insufficiency. Mild mitral regurgitation. Mild tricuspid regurgitation. Moderate pulmonary hypertension, RVSP 52 mmHg.  CTA neck 03/10/18: IMPRESSION: 1. Right cervical carotid bifurcation severe stenosis with at least 90% at the common carotid terminus. The plaque continues into the ECA and ICA origins with high-grade stenosis.  The bifurcation is well below the mandibular angle. 2. Remote infarcts in the right MCA/watershed territory, 1 of which has become apparent since head CT 6 weeks  prior. 3. 40% atheromatous narrowing at the left cervical carotid bifurcation. 4. Mild intracranial atherosclerosis without proximal flow limiting stenosis. 5. 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 6. Aortic Atherosclerosis (ICD10-I70.0) and Emphysema (ICD10-J43.9).  Carotid U/S  - 02/17/18: Final Interpretation: Right Carotid: Velocities in the right ICA are consistent with a 60-79% stenosis. The ECA appears >50% stenosed. Possible ulceration of the proximal ICA. Vertebrals: Right vertebral artery was patent with antegrade flow. Subclavians: Normal flow hemodynamics were seen in the right subclavian artery. - 02/02/18: IMPRESSION: 1. Critical (70-99%) stenosis proximal right internal carotid artery secondary to bulky and largely calcified echogenic atherosclerotic plaque. 2. Moderate (50-69%) stenosis proximal left internal carotid artery secondary to moderate heterogeneous atherosclerotic plaque. 3. Vertebral arteries remain patent.  Preoperative labs noted. Cr 1.24. H/H 12.4/38.1. PLT 187. PT/PTT WNL. A1c 6.6. T&S done.  UA showed negative leukocytes and nitrites.    Reproductive/Obstetrics                            Anesthesia Physical Anesthesia Plan  ASA: III  Anesthesia Plan: General   Post-op Pain Management:    Induction: Intravenous  PONV Risk Score and Plan: 2 and Ondansetron and Dexamethasone  Airway Management Planned: Oral ETT  Additional Equipment: Arterial line  Intra-op Plan:   Post-operative Plan: Extubation in OR  Informed Consent: I have reviewed the patients History and Physical, chart, labs and discussed the procedure including the risks, benefits and alternatives for the proposed anesthesia with the patient or authorized representative who has indicated his/her understanding and acceptance.   Dental advisory given  Plan Discussed with: CRNA and  Surgeon  Anesthesia Plan Comments:         Anesthesia Quick Evaluation

## 2018-03-23 NOTE — H&P (Signed)
   History and Physical Update  The patient was interviewed and re-examined.  The patient's previous History and Physical has been reviewed and is unchanged from recent office visit.  Plan for right-sided carotid endarterectomy today.  Brandon C. Donzetta Matters, MD [facility] Office: [phone] Pager: [phone]   03/23/2018, 9:27 AM

## 2018-03-23 NOTE — Progress Notes (Signed)
  Day of Surgery Note    Subjective:  No complaints   Vitals:   03/23/18 1324 03/23/18 1345  BP: (!) 96/45 (!) 110/52  Pulse: (!) 56 (!) 58  Resp: 15 17  Temp:    SpO2: 98% 99%    Incisions:   Clean and dry without hematoma Extremities:  Moving all extremities equally Lungs:  Non labored Neuro:  In tact; tongue is midline   Assessment/Plan:  This is a 84 y.o. male who is s/p  Right carotid endarterectomy   -pt doing well in pacu and neuro in tact; tongue is midline -incision is clean and dry without hematoma -BP soft and responded nicely to fluid bolus -to 4 east when bed available   Leontine Locket, PA-C 03/23/2018 2:02 PM [phone]

## 2018-03-23 NOTE — Anesthesia Procedure Notes (Signed)
Arterial Line Insertion Start/End4/10/2018 9:45 AM, 03/23/2018 9:58 AM Performed by: Teressa Lower., CRNA, CRNA  Patient location: Pre-op. Preanesthetic checklist: patient identified, IV checked, site marked, risks and benefits discussed, surgical consent, monitors and equipment checked, pre-op evaluation, timeout performed and anesthesia consent Lidocaine 1% used for infiltration Right, radial was placed Catheter size: 20 G Hand hygiene performed , maximum sterile barriers used  and Seldinger technique used Allen's test indicative of satisfactory collateral circulation Attempts: 3 Procedure performed without using ultrasound guided technique. Following insertion, dressing applied. Post procedure assessment: normal and unchanged  Post procedure complications: local hematoma. Patient tolerated the procedure well with no immediate complications. Additional procedure comments: 2 attempts on left radial by walt cockfield crna and 1 attempt on right radial by sarah bell crna.[name]

## 2018-03-23 NOTE — Plan of Care (Signed)
  Problem: Education: Goal: Knowledge of General Education information will improve Outcome: Progressing   Problem: Education: Goal: Knowledge of discharge needs will improve Outcome: Progressing   Problem: Clinical Measurements: Goal: Postoperative complications will be avoided or minimized Outcome: Progressing

## 2018-03-23 NOTE — Discharge Instructions (Signed)
° °  [facility] ° °Discharge Instructions °  °Carotid Endarterectomy (CEA) ° °Please refer to the following instructions for your post-procedure care. Your surgeon or physician assistant will discuss any changes with you. ° °Activity ° °You are encouraged to walk as much as you can. You can slowly return to normal activities but must avoid strenuous activity and heavy lifting until your doctor tell you it's okay. Avoid activities such as vacuuming or swinging a golf club. You can drive after one week if you are comfortable and you are no longer taking prescription pain medications. It is normal to feel tired for serval weeks after your surgery. It is also normal to have difficulty with sleep habits, eating, and bowel movements after surgery. These will go away with time. ° °Bathing/Showering ° °Shower daily after you go home. Do not soak in a bathtub, hot tub, or swim until the incision heals completely. ° °Incision Care ° °Shower every day. Clean your incision with mild soap and water. Pat the area dry with a clean towel. You do not need a bandage unless otherwise instructed. Do not apply any ointments or creams to your incision. You may have skin glue on your incision. Do not peel it off. It will come off on its own in about one week. Your incision may feel thickened and raised for several weeks after your surgery. This is normal and the skin will soften over time.  ° °For Men Only: It's okay to shave around the incision but do not shave the incision itself for 2 weeks. It is common to have numbness under your chin that could last for several months. ° °Diet ° °Resume your normal diet. There are no special food restrictions following this procedure. A low fat/low cholesterol diet is recommended for all patients with vascular disease. In order to heal from your surgery, it is CRITICAL to get adequate nutrition. Your body requires vitamins, minerals, and protein. Vegetables are the  best source of vitamins and minerals. Vegetables also provide the perfect balance of protein. Processed food has little nutritional value, so try to avoid this. ° °Medications ° °Resume taking all of your medications unless your doctor or physician assistant tells you not to. If your incision is causing pain, you may take over-the- counter pain relievers such as acetaminophen (Tylenol). If you were prescribed a stronger pain medication, please be aware these medications can cause nausea and constipation. Prevent nausea by taking the medication with a snack or meal. Avoid constipation by drinking plenty of fluids and eating foods with a high amount of fiber, such as fruits, vegetables, and grains.  °Do not take Tylenol if you are taking prescription pain medications. ° °Follow Up ° °Our office will schedule a follow up appointment 2-3 weeks following discharge. ° °Please call us immediately for any of the following conditions ° °Increased pain, redness, drainage (pus) from your incision site. °Fever of 101 degrees or higher. °If you should develop stroke (slurred speech, difficulty swallowing, weakness on one side of your body, loss of vision) you should call 911 and go to the nearest emergency room. ° °Reduce your risk of vascular disease: ° °Stop smoking. If you would like help call [facility] at 1-800-QUIT-NOW (1-800-784-8669) or [place] at [phone]. °Manage your cholesterol °Maintain a desired weight °Control your diabetes °Keep your blood pressure down ° °If you have any questions, please call the office at [phone]. ° °

## 2018-03-24 ENCOUNTER — Encounter (HOSPITAL_COMMUNITY): Payer: Self-pay | Admitting: Vascular Surgery

## 2018-03-24 LAB — BASIC METABOLIC PANEL
ANION GAP: 8 (ref 5–15)
BUN: 26 mg/dL — AB (ref 6–20)
CALCIUM: 7.8 mg/dL — AB (ref 8.9–10.3)
CO2: 21 mmol/L — AB (ref 22–32)
Chloride: 109 mmol/L (ref 101–111)
Creatinine, Ser: 1.19 mg/dL (ref 0.61–1.24)
GFR calc Af Amer: >60 mL/min (ref >60)
GFR calc non Af Amer: 54 mL/min — ABNORMAL LOW (ref >60)
GLUCOSE: 137 mg/dL — AB (ref 65–99)
Potassium: 4 mmol/L (ref 3.5–5.1)
Sodium: 138 mmol/L (ref 135–145)

## 2018-03-24 LAB — CBC
HEMATOCRIT: 26.6 % — AB (ref 39.0–52.0)
Hemoglobin: 8.9 g/dL — ABNORMAL LOW (ref 13.0–17.0)
MCH: 31.9 pg (ref 26.0–34.0)
MCHC: 33.5 g/dL (ref 30.0–36.0)
MCV: 95.3 fL (ref 78.0–100.0)
Platelets: 113 10*3/uL — ABNORMAL LOW (ref 150–400)
RBC: 2.79 MIL/uL — ABNORMAL LOW (ref 4.22–5.81)
RDW: 14.5 % (ref 11.5–15.5)
WBC: 7.5 10*3/uL (ref 4.0–10.5)

## 2018-03-24 LAB — GLUCOSE, CAPILLARY: Glucose-Capillary: 125 mg/dL — ABNORMAL HIGH (ref 65–99)

## 2018-03-24 NOTE — Progress Notes (Signed)
03/24/2018 1050 Discharge AVS meds taken today and those due this evening reviewed.  Follow-up appointments and when to call md reviewed.  D/C IV and TELE.  Questions and concerns addressed.   D/C home per orders. [place]

## 2018-03-24 NOTE — Progress Notes (Addendum)
  Progress Note    03/24/2018 7:22 AM 1 Day Post-Op  Subjective:  Denies slurring speech, changes in vision, or one sided weakness   Vitals:   03/24/18 0400 03/24/18 0445  BP: (!) 105/45 92/80  Pulse: (!) 54 (!) 53  Resp: 20 17  Temp:  97.9 F (36.6 C)  SpO2: 95% 98%   Physical Exam: Cardiac:  RRR Lungs:  Non labored Incisions:  R neck incision intact without drainage or hematoma, trachea midline, local ecchymosis Extremities:  Moving all extremities well Abdomen:  Soft Neurologic: CN grossly intact  CBC    Component Value Date/Time   WBC 7.5 03/24/2018 0616   RBC 2.79 (L) 03/24/2018 0616   HGB 8.9 (L) 03/24/2018 0616   HCT 26.6 (L) 03/24/2018 0616   PLT PENDING 03/24/2018 0616   MCV 95.3 03/24/2018 0616   MCH 31.9 03/24/2018 0616   MCHC 33.5 03/24/2018 0616   RDW 14.5 03/24/2018 0616    BMET    Component Value Date/Time   NA 138 03/24/2018 0616   NA 138 01/18/2018 0000   K 4.0 03/24/2018 0616   CL 109 03/24/2018 0616   CO2 21 (L) 03/24/2018 0616   GLUCOSE 137 (H) 03/24/2018 0616   BUN 26 (H) 03/24/2018 0616   BUN 18 01/18/2018 0000   CREATININE 1.19 03/24/2018 0616   CREATININE 1.15 (H) 10/15/2016 1215   CALCIUM 7.8 (L) 03/24/2018 0616   GFRNONAA 54 (L) 03/24/2018 0616   GFRAA >60 03/24/2018 0616    INR    Component Value Date/Time   INR 1.05 03/17/2018 1420     Intake/Output Summary (Last 24 hours) at 03/24/2018 0722 Last data filed at 03/24/2018 0600 Gross per 24 hour  Intake 3900 ml  Output 975 ml  Net 2925 ml     Assessment/Plan:  84 y.o. male is s/p R CEA 1 Day Post-Op   Neuro exam at baseline Ambulate this morning D/c home if ambulating without difficulty Office visit in 2 weeks   Dagoberto Ligas, PA-C [facility] [phone] 03/24/2018 7:22 AM  I have independently interviewed and examined patient and agree with PA assessment and plan above.   Brandon C. Donzetta Matters, MD [facility]  [place] Office: [phone] Pager: [phone]

## 2018-03-27 DIAGNOSIS — Z48812 Encounter for surgical aftercare following surgery on the circulatory system: Secondary | ICD-10-CM | POA: Diagnosis not present

## 2018-03-27 DIAGNOSIS — E785 Hyperlipidemia, unspecified: Secondary | ICD-10-CM | POA: Diagnosis not present

## 2018-03-27 DIAGNOSIS — I10 Essential (primary) hypertension: Secondary | ICD-10-CM | POA: Diagnosis not present

## 2018-03-27 DIAGNOSIS — I2510 Atherosclerotic heart disease of native coronary artery without angina pectoris: Secondary | ICD-10-CM | POA: Diagnosis not present

## 2018-03-27 DIAGNOSIS — E119 Type 2 diabetes mellitus without complications: Secondary | ICD-10-CM | POA: Diagnosis not present

## 2018-03-27 DIAGNOSIS — I6523 Occlusion and stenosis of bilateral carotid arteries: Secondary | ICD-10-CM | POA: Diagnosis not present

## 2018-03-27 NOTE — Discharge Summary (Signed)
Discharge Summary     FAIRLEY COPHER [birth year] 84 y.o. male  [id number]  Admission Date: 03/23/2018  Discharge Date: 03/24/18  Physician: Dr. Donzetta Matters  Admission Diagnosis: right carotid stenosis  Discharge Day services:    see progress note 03/24/18 Physical Exam: Vitals:   03/24/18 0445 03/24/18 0935  BP: 92/80 (!) 121/54  Pulse: (!) 53   Resp: 17   Temp: 97.9 F (36.6 C)   SpO2: 98%     Hospital Course:  The patient was admitted to the hospital and taken to the operating room on 03/23/2018 and underwent right carotid endarterectomy.  The pt tolerated the procedure well and was transported to the PACU in good condition.   By POD 1, the pt neuro status remains at baseline.  Mild ipsilateral lower lip numbness is noted on exam.  The remainder of the hospital course consisted of increasing mobilization and increasing intake of solids without difficulty.  He will follow up in office in about 2 weeks.  Discharge instructions were reviewed with the patient and he voiced his understanding.  He was discharged in stable condition.   No results for input(s): NA, K, CL, CO2, GLUCOSE, BUN, CALCIUM in the last 72 hours.  Invalid input(s): CRATININE No results for input(s): WBC, HGB, HCT, PLT in the last 72 hours. No results for input(s): INR in the last 72 hours.     Discharge Diagnosis:  right carotid stenosis  Secondary Diagnosis: Patient Active Problem List   Diagnosis Date Noted  . Carotid stenosis, asymptomatic, right 03/23/2018  . Bilateral carotid bruits 10/15/2016  . Acute myocardial infarction   . Dyslipidemia   . Coronary artery disease   . Hypertension   . Hyperlipemia    Past Medical History:  Diagnosis Date  . Acute myocardial infarction   . Carotid stenosis   . Coronary artery disease    PTCA and stenting of his right coronary artery. 3.5 x 16-mm Liberte stent.              . Diabetes mellitus without complication ([place])   . Dyslipidemia   .  Hyperlipemia   . Hypertension   . Partial tear of rotator cuff    right shoulder  . Peripheral vascular disease ([place])   . Pulmonary hypertension (HCC)    moderate pulmonary HTN by 2007 echo (RVSP 52 mmHg)  . Stroke ([place])   . Wears dentures     Allergies as of 03/24/2018      Reactions   No Known Allergies       Medication List    TAKE these medications   aspirin EC 81 MG tablet Take 81 mg by mouth 2 (two) times daily.   carvedilol 12.5 MG tablet Commonly known as:  COREG TAKE 1 TABLET BY MOUTH TWICE DAILY WITH A MEAL   clopidogrel 75 MG tablet Commonly known as:  PLAVIX Take 1 tablet (75 mg total) by mouth daily. What changed:  when to take this   lisinopril-hydrochlorothiazide 20-25 MG tablet Commonly known as:  PRINZIDE,ZESTORETIC TAKE 1 TABLET BY MOUTH DAILY   metFORMIN 1000 MG tablet Commonly known as:  GLUCOPHAGE Take 1,000 mg by mouth 2 (two) times daily with a meal.   nitroGLYCERIN 0.4 MG SL tablet Commonly known as:  NITROSTAT Place 1 tablet (0.4 mg total) under the tongue every 5 (five) minutes as needed for chest pain.   oxyCODONE-acetaminophen 5-325 MG tablet Commonly known as:  PERCOCET Take 1 tablet by mouth every 8 (eight) hours  as needed for severe pain.   rosuvastatin 10 MG tablet Commonly known as:  CRESTOR Take 1 tablet (10 mg total) by mouth daily. What changed:  when to take this        Discharge Instructions:   [facility] Discharge Instructions Carotid Endarterectomy (CEA)  Please refer to the following instructions for your post-procedure care. Your surgeon or physician assistant will discuss any changes with you.  Activity  You are encouraged to walk as much as you can. You can slowly return to normal activities but must avoid strenuous activity and heavy lifting until your doctor tell you it's OK. Avoid activities such as vacuuming or swinging a golf club. You can drive after one week if you are  comfortable and you are no longer taking prescription pain medications. It is normal to feel tired for serval weeks after your surgery. It is also normal to have difficulty with sleep habits, eating, and bowel movements after surgery. These will go away with time.  Bathing/Showering  You may shower after you come home. Do not soak in a bathtub, hot tub, or swim until the incision heals completely.  Incision Care  Shower every day. Clean your incision with mild soap and water. Pat the area dry with a clean towel. You do not need a bandage unless otherwise instructed. Do not apply any ointments or creams to your incision. You may have skin glue on your incision. Do not peel it off. It will come off on its own in about one week. Your incision may feel thickened and raised for several weeks after your surgery. This is normal and the skin will soften over time. For Men Only: It's OK to shave around the incision but do not shave the incision itself for 2 weeks. It is common to have numbness under your chin that could last for several months.  Diet  Resume your normal diet. There are no special food restrictions following this procedure. A low fat/low cholesterol diet is recommended for all patients with vascular disease. In order to heal from your surgery, it is CRITICAL to get adequate nutrition. Your body requires vitamins, minerals, and protein. Vegetables are the best source of vitamins and minerals. Vegetables also provide the perfect balance of protein. Processed food has little nutritional value, so try to avoid this.  Medications  Resume taking all of your medications unless your doctor or physician assistant tells you not to.  If your incision is causing pain, you may take over-the- counter pain relievers such as acetaminophen (Tylenol). If you were prescribed a stronger pain medication, please be aware these medications can cause nausea and constipation.  Prevent nausea by taking the medication  with a snack or meal. Avoid constipation by drinking plenty of fluids and eating foods with a high amount of fiber, such as fruits, vegetables, and grains. Do not take Tylenol if you are taking prescription pain medications.  Follow Up  Our office will schedule a follow up appointment 2-3 weeks following discharge.  Please call us immediately for any of the following conditions  Increased pain, redness, drainage (pus) from your incision site. Fever of 101 degrees or higher. If you should develop stroke (slurred speech, difficulty swallowing, weakness on one side of your body, loss of vision) you should call 911 and go to the nearest emergency room.  Reduce your risk of vascular disease:  Stop smoking. If you would like help call [facility] at 1-800-QUIT-NOW 515-339-5169) or [place] at  157-262-0355. Manage your cholesterol Maintain a desired weight Control your diabetes Keep your blood pressure down  If you have any questions, please call the office at [phone].  Disposition: home  Patient's condition: is Good  Follow up: 1. Dr. Donzetta Matters in 2 weeks.   Dagoberto Ligas, PA-C [facility] [phone]   --- For [facility] Registry use ---   Modified Rankin score at D/C (0-6): 0  IV medication needed for:  1. Hypertension: No 2. Hypotension: No  Post-op Complications: No  1. Post-op CVA or TIA: No  If yes: Event classification (right eye, left eye, right cortical, left cortical, verterobasilar, other):   If yes: Timing of event (intra-op, <6 hrs post-op, >=6 hrs post-op, unknown):   2. CN injury: No  If yes: CN  injuried   3. Myocardial infarction: No  If yes: Dx by (EKG or clinical, Troponin):   4.  CHF: No  5.  Dysrhythmia (new): No  6. Wound infection: No  7. Reperfusion symptoms: No  8. Return to OR: No  If yes: return to OR for (bleeding, neurologic, other CEA incision, other):   Discharge medications: Statin use:  Yes ASA use:   Yes   Beta blocker use:  Yes ACE-Inhibitor use:  Yes  ARB use:  No CCB use: No P2Y12 Antagonist use: Yes, [x ] Plavix, [ ]  Plasugrel, [ ]  Ticlopinine, [ ]  Ticagrelor, [ ]  Other, [ ]  No for medical reason, [ ]  Non-compliant, [ ]  Not-indicated Anti-coagulant use:  No, [ ]  Warfarin, [ ]  Rivaroxaban, [ ]  Dabigatran,

## 2018-03-29 DIAGNOSIS — Z48812 Encounter for surgical aftercare following surgery on the circulatory system: Secondary | ICD-10-CM | POA: Diagnosis not present

## 2018-03-29 DIAGNOSIS — I10 Essential (primary) hypertension: Secondary | ICD-10-CM | POA: Diagnosis not present

## 2018-03-29 DIAGNOSIS — E119 Type 2 diabetes mellitus without complications: Secondary | ICD-10-CM | POA: Diagnosis not present

## 2018-03-29 DIAGNOSIS — E785 Hyperlipidemia, unspecified: Secondary | ICD-10-CM | POA: Diagnosis not present

## 2018-03-29 DIAGNOSIS — I6523 Occlusion and stenosis of bilateral carotid arteries: Secondary | ICD-10-CM | POA: Diagnosis not present

## 2018-03-29 DIAGNOSIS — I2510 Atherosclerotic heart disease of native coronary artery without angina pectoris: Secondary | ICD-10-CM | POA: Diagnosis not present

## 2018-03-31 ENCOUNTER — Encounter (HOSPITAL_COMMUNITY): Payer: Self-pay | Admitting: Vascular Surgery

## 2018-03-31 NOTE — Anesthesia Postprocedure Evaluation (Signed)
Anesthesia Post Note  Patient: Todd Armstrong  Procedure(s) Performed: RIGHT CAROTID ARTERY ENDARTERECTOMY (Right Neck) WITH XENOSURE 1CM X 6CM PATCH ANGIOPLASTY (Right Neck)     Patient location during evaluation: PACU Anesthesia Type: General Level of consciousness: awake and alert Pain management: pain level controlled Vital Signs Assessment: post-procedure vital signs reviewed and stable Respiratory status: spontaneous breathing, nonlabored ventilation, respiratory function stable and patient connected to nasal cannula oxygen Cardiovascular status: blood pressure returned to baseline and stable Postop Assessment: no apparent nausea or vomiting Anesthetic complications: no    Last Vitals:  Vitals:   03/24/18 0445 03/24/18 0935  BP: 92/80 (!) 121/54  Pulse: (!) 53   Resp: 17   Temp: 36.6 C   SpO2: 98%     Last Pain:  Vitals:   03/24/18 1052  TempSrc:   PainSc: 0-No pain                 Samiha Denapoli

## 2018-04-01 DIAGNOSIS — I2510 Atherosclerotic heart disease of native coronary artery without angina pectoris: Secondary | ICD-10-CM | POA: Diagnosis not present

## 2018-04-01 DIAGNOSIS — E119 Type 2 diabetes mellitus without complications: Secondary | ICD-10-CM | POA: Diagnosis not present

## 2018-04-01 DIAGNOSIS — I10 Essential (primary) hypertension: Secondary | ICD-10-CM | POA: Diagnosis not present

## 2018-04-01 DIAGNOSIS — I6523 Occlusion and stenosis of bilateral carotid arteries: Secondary | ICD-10-CM | POA: Diagnosis not present

## 2018-04-01 DIAGNOSIS — Z48812 Encounter for surgical aftercare following surgery on the circulatory system: Secondary | ICD-10-CM | POA: Diagnosis not present

## 2018-04-01 DIAGNOSIS — E785 Hyperlipidemia, unspecified: Secondary | ICD-10-CM | POA: Diagnosis not present

## 2018-04-04 DIAGNOSIS — Z48812 Encounter for surgical aftercare following surgery on the circulatory system: Secondary | ICD-10-CM | POA: Diagnosis not present

## 2018-04-04 DIAGNOSIS — I2510 Atherosclerotic heart disease of native coronary artery without angina pectoris: Secondary | ICD-10-CM | POA: Diagnosis not present

## 2018-04-04 DIAGNOSIS — I6523 Occlusion and stenosis of bilateral carotid arteries: Secondary | ICD-10-CM | POA: Diagnosis not present

## 2018-04-04 DIAGNOSIS — E119 Type 2 diabetes mellitus without complications: Secondary | ICD-10-CM | POA: Diagnosis not present

## 2018-04-04 DIAGNOSIS — I10 Essential (primary) hypertension: Secondary | ICD-10-CM | POA: Diagnosis not present

## 2018-04-04 DIAGNOSIS — E785 Hyperlipidemia, unspecified: Secondary | ICD-10-CM | POA: Diagnosis not present

## 2018-04-06 DIAGNOSIS — Z48812 Encounter for surgical aftercare following surgery on the circulatory system: Secondary | ICD-10-CM | POA: Diagnosis not present

## 2018-04-06 DIAGNOSIS — E785 Hyperlipidemia, unspecified: Secondary | ICD-10-CM | POA: Diagnosis not present

## 2018-04-06 DIAGNOSIS — E119 Type 2 diabetes mellitus without complications: Secondary | ICD-10-CM | POA: Diagnosis not present

## 2018-04-06 DIAGNOSIS — I6523 Occlusion and stenosis of bilateral carotid arteries: Secondary | ICD-10-CM | POA: Diagnosis not present

## 2018-04-06 DIAGNOSIS — I10 Essential (primary) hypertension: Secondary | ICD-10-CM | POA: Diagnosis not present

## 2018-04-06 DIAGNOSIS — I2510 Atherosclerotic heart disease of native coronary artery without angina pectoris: Secondary | ICD-10-CM | POA: Diagnosis not present

## 2018-04-07 ENCOUNTER — Other Ambulatory Visit: Payer: Self-pay

## 2018-04-07 ENCOUNTER — Encounter: Payer: Self-pay | Admitting: Vascular Surgery

## 2018-04-07 ENCOUNTER — Ambulatory Visit (INDEPENDENT_AMBULATORY_CARE_PROVIDER_SITE_OTHER): Payer: Self-pay | Admitting: Vascular Surgery

## 2018-04-07 VITALS — BP 124/70 | HR 57 | Temp 97.0°F | Resp 16 | Ht 70.0 in | Wt 153.0 lb

## 2018-04-07 DIAGNOSIS — I6529 Occlusion and stenosis of unspecified carotid artery: Secondary | ICD-10-CM

## 2018-04-07 NOTE — Progress Notes (Signed)
Subjective:     Patient ID: Todd Armstrong, male   DOB: [birth year], 84 y.o.   MRN: [id number]  HPI 84 year old male status post right carotid endarterectomy for symptomatic disease.  He did very well from surgery he never had any complaints.  He is healing well with a little bit of numbness on the medial aspect of his incision but otherwise okay.   Review of Systems Right neck numbness Shortness of breath with activity    Objective:   Physical Exam Awake alert and oriented Tongue is midline Cranial nerves intact Bilateral upper and lower extremity strength intact Right neck incision healing well with some Dermabond in place and numbness on the medial aspect of the incision    Assessment/plan     84 year old male status post carotid endarterectomy for what appeared to be symptomatic disease with a period of confusion.  Contralateral side was only 40%.  He remains on aspirin.  He will follow-up in 6 months for carotid duplex evaluation.  Is okay to shave his incision site with electric razor at this time but he does need to be careful given his numbness that is expected postoperative.       Todd Armstrong C. Donzetta Matters, MD [facility] [place] Office: [phone] Pager: [phone]

## 2018-04-11 DIAGNOSIS — Z48812 Encounter for surgical aftercare following surgery on the circulatory system: Secondary | ICD-10-CM | POA: Diagnosis not present

## 2018-04-11 DIAGNOSIS — I10 Essential (primary) hypertension: Secondary | ICD-10-CM | POA: Diagnosis not present

## 2018-04-11 DIAGNOSIS — I6523 Occlusion and stenosis of bilateral carotid arteries: Secondary | ICD-10-CM | POA: Diagnosis not present

## 2018-04-11 DIAGNOSIS — E119 Type 2 diabetes mellitus without complications: Secondary | ICD-10-CM | POA: Diagnosis not present

## 2018-04-11 DIAGNOSIS — I2510 Atherosclerotic heart disease of native coronary artery without angina pectoris: Secondary | ICD-10-CM | POA: Diagnosis not present

## 2018-04-11 DIAGNOSIS — E785 Hyperlipidemia, unspecified: Secondary | ICD-10-CM | POA: Diagnosis not present

## 2018-04-13 DIAGNOSIS — I6523 Occlusion and stenosis of bilateral carotid arteries: Secondary | ICD-10-CM | POA: Diagnosis not present

## 2018-04-13 DIAGNOSIS — Z48812 Encounter for surgical aftercare following surgery on the circulatory system: Secondary | ICD-10-CM | POA: Diagnosis not present

## 2018-04-13 DIAGNOSIS — I10 Essential (primary) hypertension: Secondary | ICD-10-CM | POA: Diagnosis not present

## 2018-04-13 DIAGNOSIS — E119 Type 2 diabetes mellitus without complications: Secondary | ICD-10-CM | POA: Diagnosis not present

## 2018-04-13 DIAGNOSIS — I2510 Atherosclerotic heart disease of native coronary artery without angina pectoris: Secondary | ICD-10-CM | POA: Diagnosis not present

## 2018-04-13 DIAGNOSIS — E785 Hyperlipidemia, unspecified: Secondary | ICD-10-CM | POA: Diagnosis not present

## 2018-04-24 DIAGNOSIS — E7800 Pure hypercholesterolemia, unspecified: Secondary | ICD-10-CM | POA: Diagnosis not present

## 2018-04-24 DIAGNOSIS — Z0000 Encounter for general adult medical examination without abnormal findings: Secondary | ICD-10-CM | POA: Diagnosis not present

## 2018-04-24 DIAGNOSIS — Z1389 Encounter for screening for other disorder: Secondary | ICD-10-CM | POA: Diagnosis not present

## 2018-04-24 DIAGNOSIS — E119 Type 2 diabetes mellitus without complications: Secondary | ICD-10-CM | POA: Diagnosis not present

## 2018-04-24 DIAGNOSIS — E0869 Diabetes mellitus due to underlying condition with other specified complication: Secondary | ICD-10-CM | POA: Diagnosis not present

## 2018-04-24 DIAGNOSIS — I6521 Occlusion and stenosis of right carotid artery: Secondary | ICD-10-CM | POA: Diagnosis not present

## 2018-04-24 DIAGNOSIS — I10 Essential (primary) hypertension: Secondary | ICD-10-CM | POA: Diagnosis not present

## 2018-05-24 DIAGNOSIS — L84 Corns and callosities: Secondary | ICD-10-CM | POA: Diagnosis not present

## 2018-05-24 DIAGNOSIS — L602 Onychogryphosis: Secondary | ICD-10-CM | POA: Diagnosis not present

## 2018-05-24 DIAGNOSIS — E1351 Other specified diabetes mellitus with diabetic peripheral angiopathy without gangrene: Secondary | ICD-10-CM | POA: Diagnosis not present

## 2018-05-25 ENCOUNTER — Other Ambulatory Visit: Payer: Self-pay

## 2018-05-25 DIAGNOSIS — I2510 Atherosclerotic heart disease of native coronary artery without angina pectoris: Secondary | ICD-10-CM

## 2018-06-20 DIAGNOSIS — D485 Neoplasm of uncertain behavior of skin: Secondary | ICD-10-CM | POA: Diagnosis not present

## 2018-06-20 DIAGNOSIS — Z85828 Personal history of other malignant neoplasm of skin: Secondary | ICD-10-CM | POA: Diagnosis not present

## 2018-06-20 DIAGNOSIS — L218 Other seborrheic dermatitis: Secondary | ICD-10-CM | POA: Diagnosis not present

## 2018-06-20 DIAGNOSIS — D0462 Carcinoma in situ of skin of left upper limb, including shoulder: Secondary | ICD-10-CM | POA: Diagnosis not present

## 2018-06-20 DIAGNOSIS — L570 Actinic keratosis: Secondary | ICD-10-CM | POA: Diagnosis not present

## 2018-07-25 IMAGING — CT CT ANGIO NECK
1 of 5 series · 3 of 16 positions shown · IV contrast (iopamidol)
Comparison: 01/27/2018 head CT

CLINICAL DATA: Follow-up carotid stenosis

EXAM:
CT ANGIOGRAPHY HEAD AND NECK
TECHNIQUE: Multidetector CT imaging of the head and neck was performed using
the standard protocol during bolus administration of intravenous
contrast. Multiplanar CT image reconstructions and MIPs were
obtained to evaluate the vascular anatomy. Carotid stenosis
measurements (when applicable) are obtained utilizing NASCET
criteria, using the distal internal carotid diameter as the
denominator.
CONTRAST:  75mL 1BBA6C-ION IOPAMIDOL (1BBA6C-ION) INJECTION 76%

[Series 6: head/neck angio · axial · 0.46mm/px · z∈[-570,-195]mm · 3 of 126 slices shown]
[im 1/126  soft-tissue]
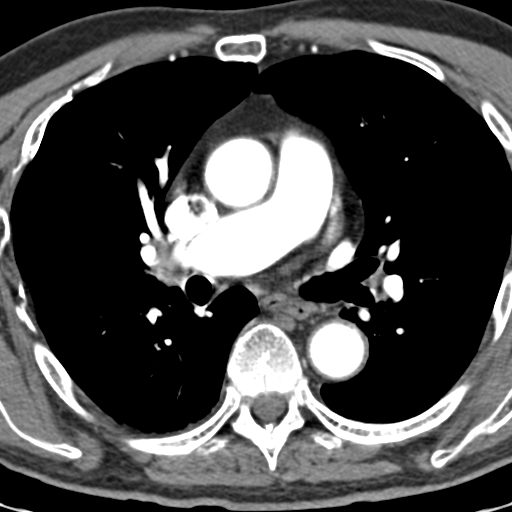
[im 63/126  bone]
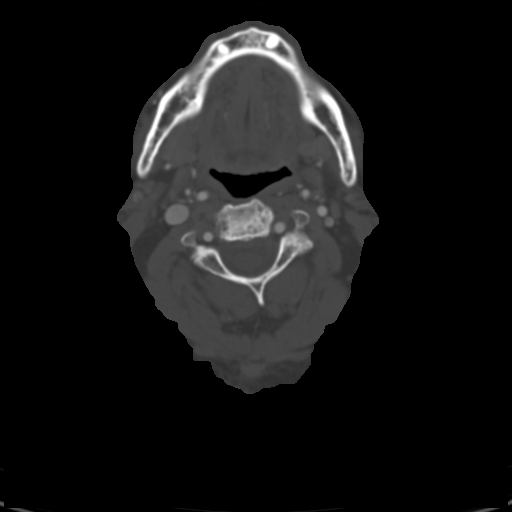
[im 126/126  soft-tissue]
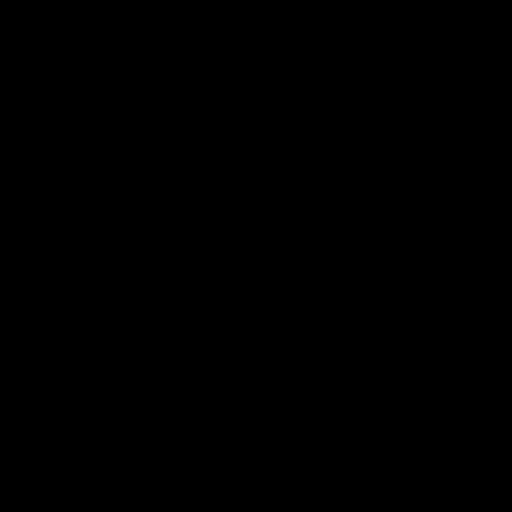

[3 of 16 positions shown; findings below may reference images not displayed]

FINDINGS: CT HEAD FINDINGS

Brain: Small to moderate remote high anterior right frontal infarct,
cortically based. Small remote infarct at the genu of right internal
capsule. Small right inferior temporal occipital infarct that has
become apparent since 01/27/2018 head CT. This is along the
posterior MCA border zone. No hemorrhage, hydrocephalus, or atrophy.

Vascular: See below

Skull: No acute or aggressive finding

Sinuses: Negative

Orbits: Negative

Review of the MIP images confirms the above findings

CTA NECK FINDINGS

Aortic arch: Atherosclerotic wall thickening and occasional
calcification. No dilatation or acute finding. Three vessel
branching.

Right carotid system: Bulky primarily calcified but irregular plaque
at the common carotid bifurcation and ICA/CCA origins. The lumen is
so stenotic that is difficult to measure. ECA stenosis, with
stenotic lumen compared to the more proximal vessel due to immediate
distal bifurcation, measures approximately 90%. ICA and ECA origin
stenosis are advanced, ICA stenosis measuring 60-70%. The
bifurcation is well below the angle of the mandible.

Left carotid system: Bulky primarily calcified plaque at the common
carotid bifurcation. Stenosis measures up to 40%. No dissection or
ulceration.

Vertebral arteries: No proximal subclavian flow limiting stenosis.
The vertebrals are smooth and diffusely patent

Skeleton: Right posterior third rib bone lesion with central lucency
that continues into the vertebral body at the ankylosed
costovertebral joint. Similar changes affect the posterior fourth
rib. Both of these ribs laterally have a flat wavy appearance.
Findings correlate with chest x-ray from 5002 and chest CT report
from 0662. Fibrous dysplasia is still favored. Advanced cervical
disc and facet degeneration with C3-4 retrolisthesis. T1-2
anterolisthesis.

Other neck: No incidental mass or inflammation.

Upper chest: No acute or aggressive finding

Review of the MIP images confirms the above findings

CTA HEAD FINDINGS

Anterior circulation: Mild atherosclerotic plaque on the carotid
siphons. No branch occlusion or flow limiting stenosis. Negative for
aneurysm.

Posterior circulation: Codominant vertebral arteries. Vertebral,
basilar arteries and its branches are smooth and diffusely patent.

Venous sinuses: Patent

Anatomic variants: None significant

Delayed phase: No abnormal intracranial enhancement

Review of the MIP images confirms the above findings
IMPRESSION: 1. Right cervical carotid bifurcation severe stenosis with at least
90% at the common carotid terminus. The plaque continues into the
ECA and ICA origins with high-grade stenosis. The bifurcation is
well below the mandibular angle.
2. Remote infarcts in the right MCA/watershed territory, 1 of which
has become apparent since head CT 6 weeks prior.
3. 40% atheromatous narrowing at the left cervical carotid
bifurcation.
4. Mild intracranial atherosclerosis without proximal flow limiting
stenosis.
5. Right third and fifth rib lesions that are chronic based on 5002
and 0662 imaging, likely fibrous dysplasia.
6. Aortic Atherosclerosis (GS5TJ-YYT.T) and Emphysema (GS5TJ-QTE.S).

## 2018-07-28 ENCOUNTER — Other Ambulatory Visit: Payer: Self-pay | Admitting: Cardiovascular Disease

## 2018-08-02 DIAGNOSIS — L84 Corns and callosities: Secondary | ICD-10-CM | POA: Diagnosis not present

## 2018-08-02 DIAGNOSIS — L602 Onychogryphosis: Secondary | ICD-10-CM | POA: Diagnosis not present

## 2018-08-02 DIAGNOSIS — E1351 Other specified diabetes mellitus with diabetic peripheral angiopathy without gangrene: Secondary | ICD-10-CM | POA: Diagnosis not present

## 2018-08-16 ENCOUNTER — Encounter: Payer: Self-pay | Admitting: Cardiovascular Disease

## 2018-08-16 ENCOUNTER — Ambulatory Visit (INDEPENDENT_AMBULATORY_CARE_PROVIDER_SITE_OTHER): Payer: Medicare Other | Admitting: Cardiovascular Disease

## 2018-08-16 VITALS — BP 100/44 | HR 56 | Ht 70.0 in | Wt 154.0 lb

## 2018-08-16 DIAGNOSIS — I2510 Atherosclerotic heart disease of native coronary artery without angina pectoris: Secondary | ICD-10-CM | POA: Diagnosis not present

## 2018-08-16 DIAGNOSIS — E782 Mixed hyperlipidemia: Secondary | ICD-10-CM | POA: Diagnosis not present

## 2018-08-16 DIAGNOSIS — I6523 Occlusion and stenosis of bilateral carotid arteries: Secondary | ICD-10-CM | POA: Diagnosis not present

## 2018-08-16 LAB — BASIC METABOLIC PANEL
BUN / CREAT RATIO: 19 (ref 10–24)
BUN: 22 mg/dL (ref 8–27)
CO2: 24 mmol/L (ref 20–29)
Calcium: 9.5 mg/dL (ref 8.6–10.2)
Chloride: 100 mmol/L (ref 96–106)
Creatinine, Ser: 1.17 mg/dL (ref 0.76–1.27)
GFR calc non Af Amer: 57 mL/min/{1.73_m2} — ABNORMAL LOW (ref >59)
GFR, EST AFRICAN AMERICAN: 65 mL/min/{1.73_m2} (ref >59)
Glucose: 119 mg/dL — ABNORMAL HIGH (ref 65–99)
Potassium: 4.7 mmol/L (ref 3.5–5.2)
Sodium: 139 mmol/L (ref 134–144)

## 2018-08-16 LAB — HEPATIC FUNCTION PANEL
ALK PHOS: 48 IU/L (ref 39–117)
ALT: 14 IU/L (ref 0–44)
AST: 19 IU/L (ref 0–40)
Albumin: 4.3 g/dL (ref 3.5–4.7)
BILIRUBIN, DIRECT: 0.15 mg/dL (ref 0.00–0.40)
Bilirubin Total: 0.5 mg/dL (ref 0.0–1.2)
Total Protein: 6.2 g/dL (ref 6.0–8.5)

## 2018-08-16 LAB — LIPID PANEL
CHOL/HDL RATIO: 1.9 ratio (ref 0.0–5.0)
Cholesterol, Total: 125 mg/dL (ref 100–199)
HDL: 66 mg/dL (ref >39)
LDL Calculated: 38 mg/dL (ref 0–99)
TRIGLYCERIDES: 103 mg/dL (ref 0–149)
VLDL Cholesterol Cal: 21 mg/dL (ref 5–40)

## 2018-08-16 MED ORDER — CARVEDILOL 6.25 MG PO TABS: 6.2500 mg | ORAL_TABLET | Freq: Two times a day (BID) | ORAL | 3 refills | Status: DC

## 2018-08-16 NOTE — Patient Instructions (Addendum)
Medication Instructions:  Your physician has recommended you make the following change in your medication:   DECREASE Carvedilol (Coreg) to 6.25 mg twice daily   Labwork: TODAY - cholesterol, liver panel, basic metabolic panel   Testing/Procedures: None Ordered   Follow-Up: Your physician wants you to follow-up in: 6 months with Dr. Acie Fredrickson. You will receive a reminder letter in the mail two months in advance. If you don't receive a letter, please call our office to schedule the follow-up appointment.   If you need a refill on your cardiac medications before your next appointment, please call your pharmacy.   Thank you for choosing [facility]! Christen Bame, RN [phone]

## 2018-08-16 NOTE — Progress Notes (Signed)
Cardiology Office Note   Date:  08/16/2018   ID:  Todd Armstrong, DOB 04/16/1931, MRN [id number]  PCP:  Lavone Orn, MD  Cardiologist:   [name], MD   Chief Complaint  Patient presents with  . Coronary Artery Disease   1. CAD, s/p LAD stent 2009 2. HTN 3. Diabetes Mellitus 4. Hyperlipidemia 5. Carotid artery stenosis  - Right - mod- severe    Todd Armstrong is doing well from a cardiac standpoint. He has not had any episodes of chest pain or shortness breath. [name] been exercising on a regular basis. He had a nice garden this year.   Apr 26, 2013:  Todd Armstrong is doing well. No CP , no dyspnea. He has put in his garden this year.   Nov. 12, 2014:  Apr 24, 2014:  Todd Armstrong is doing well. No CP , no dyspnea.  hes getting his garden going.   Nov. 9, 2015:  Todd Armstrong is doing well. His BP was elevated at his last visit. We increased his Lisinopril/HCTZ and his Bp is much better.    May 15, 2015:   Todd Armstrong is a 84 y.o. male who presents for his CAD Doing well.   No CP or dyspnea.  Still caring for his wife who is ill.  Still very busy working out in the garden   Nov. 29, 2016:  Doing well.  No CP or dyspnea.  Still very busy taking care of his wife, Todd Armstrong  She is having lots of bleeding from her bowels.   May 13, 2016:  Todd Armstrong is doing well.  Still taking care of Todd Armstrong  - she has more bad days than good days .  Enjoys his garden  BP and HR are very good No CP or dyspnea  Nov. 3, 2017:  Doing well.   Worked in his garden this past summer .  BP is well controlled.  HR is slow,  But no syncopal episodes  Having more challenges with wife , Todd Armstrong now.   She seems to have more dementia .  July 18. 2018  No issues, no CP .  BP and HR are well controlled.   January 18, 2018:  Doing well.   Stays busy.    He has moderate to severe right carotid artery stenosis.  Recommendation is for a follow-up in 12 months which will be November,  2019.  Sept. 4, 2019:  Tough time dealing with wife Todd Armstrong,   S/p recent right carotid endarectomy Todd Salon, MD )  No CP Has occasional dizziness    Past Medical History:  Diagnosis Date  . Acute myocardial infarction   . Carotid stenosis   . Coronary artery disease    PTCA and stenting of his right coronary artery. 3.5 x 16-mm Liberte stent.              . Diabetes mellitus without complication ([place])   . Dyslipidemia   . Hyperlipemia   . Hypertension   . Partial tear of rotator cuff    right shoulder  . Peripheral vascular disease ([place])   . Pulmonary hypertension (HCC)    moderate pulmonary HTN by 2007 echo (RVSP 52 mmHg)  . Stroke ([place])   . Wears dentures     Past Surgical History:  Procedure Laterality Date  . CARDIAC CATHETERIZATION  11/05/2008  . CARDIAC CATHETERIZATION  10/22/1993   EF 60%  . CAROTID ENDARTERECTOMY Right 03/23/2018  . CORONARY ANGIOPLASTY WITH STENT PLACEMENT    .  ENDARTERECTOMY Right 03/23/2018   Procedure: RIGHT CAROTID ARTERY ENDARTERECTOMY;  Surgeon: Waynetta Sandy, MD;  Location: [facility];  Service: Vascular;  Laterality: Right;  . FRACTURE SURGERY     righ fibula and tibia  . HERNIA REPAIR  1984  . KNEE ARTHROSCOPY Left   . MULTIPLE TOOTH EXTRACTIONS    . PATCH ANGIOPLASTY Right 03/23/2018   Procedure: WITH XENOSURE 1CM X 6CM PATCH ANGIOPLASTY;  Surgeon: Waynetta Sandy, MD;  Location: [facility];  Service: Vascular;  Laterality: Right;  . SHOULDER ARTHROSCOPY Right 06/09/2017   Procedure: ARTHROSCOPY SHOULDER SUBACROMIAL DECOMPRESSION AND ACROMIOPLASTY;  Surgeon: Melrose Nakayama, MD;  Location: [facility];  Service: Orthopedics;  Laterality: Right;  GENERAL ANESTHESIA WITH BLOCK     Current Outpatient Medications  Medication Sig Dispense Refill  . aspirin EC 81 MG tablet Take 81 mg by mouth 2 (two) times daily.     . carvedilol (COREG) 12.5 MG tablet TAKE 1 TABLET BY MOUTH TWICE DAILY WITH A MEAL 180 tablet 2  . clopidogrel  (PLAVIX) 75 MG tablet Take 1 tablet (75 mg total) by mouth daily. 30 tablet 5  . lisinopril-hydrochlorothiazide (PRINZIDE,ZESTORETIC) 20-25 MG tablet TAKE 1 TABLET BY MOUTH DAILY 90 tablet 1  . metFORMIN (GLUCOPHAGE) 1000 MG tablet Take 1,000 mg by mouth 2 (two) times daily with a meal.      . nitroGLYCERIN (NITROSTAT) 0.4 MG SL tablet Place 1 tablet (0.4 mg total) under the tongue every 5 (five) minutes as needed for chest pain. 25 tablet 6  . rosuvastatin (CRESTOR) 10 MG tablet TAKE 1 TABLET BY MOUTH DAILY 90 tablet 1   No current facility-administered medications for this visit.     Allergies:   No known allergies    Social History:  The patient  reports that he has quit smoking. His smoking use included cigarettes. He has never used smokeless tobacco. He reports that he does not drink alcohol or use drugs.   Family History:  The patient's family history includes Heart attack in his father.    ROS: As per current history.  All other systems are negative.   Physical Exam: Blood pressure (!) 100/44, pulse (!) 56, height 5\' 10"  (1.778 m), weight 154 lb (69.9 kg), SpO2 98 %.  GEN:  Well nourished, well developed in no acute distress HEENT: Normal NECK: No JVD;  Well healed R CEA  Scar .  LYMPHATICS: No lymphadenopathy CARDIAC: RRR  RESPIRATORY:  Clear to auscultation without rales, wheezing or rhonchi  ABDOMEN: Soft, non-tender, non-distended MUSCULOSKELETAL:  No edema; No deformity  SKIN: Warm and dry NEUROLOGIC:  Alert and oriented x 3   EKG:       Recent Labs: 03/17/2018: ALT 14 03/24/2018: BUN 26; Creatinine, Ser 1.19; Hemoglobin 8.9; Platelets 113; Potassium 4.0; Sodium 138    Lipid Panel    Component Value Date/Time   CHOL 135 01/18/2018 1005   TRIG 85 01/18/2018 1005   HDL 76 01/18/2018 1005   CHOLHDL 1.8 01/18/2018 1005   CHOLHDL 2.3 10/15/2016 1215   VLDL 34 (H) 10/15/2016 1215   LDLCALC 42 01/18/2018 1005      Wt Readings from Last 3 Encounters:    08/16/18 154 lb (69.9 kg)  04/07/18 153 lb (69.4 kg)  03/23/18 156 lb (70.8 kg)      Other studies Reviewed: Additional studies/ records that were reviewed today include: . Review of the above records demonstrates:    ASSESSMENT AND PLAN:  1. CAD, s/p LAD stent  2009 -  No angina    2.  Carotid artery stenosis:  Doing well , follow up with Dr. Donzetta Matters. He can probably decrease his ASA to 81 mg a day soon.   Will defer to Dr. Donzetta Matters .   2. HTN -   BP is actually low today  Will reduce Coreg to 6.25 BID . This should help his orthostasis symptoms   3. Diabetes Mellitus  4. Hyperlipidemia -  Last lipids looked great.  Recheck today  5. Carotid bruits:   Recheck carotid duplex in 1 month    Current medicines are reviewed at length with the patient today.  The patient does not have concerns regarding medicines.  The following changes have been made:  no change  Labs/ tests ordered today include:  No orders of the defined types were placed in this encounter.   Disposition:   FU with me in 6 months     [name], MD  08/16/2018 10:23 AM    [address], [place],   [address] Phone: [phone]; Fax: [phone]

## 2018-09-12 ENCOUNTER — Encounter: Payer: Self-pay | Admitting: Family

## 2018-09-12 ENCOUNTER — Ambulatory Visit (INDEPENDENT_AMBULATORY_CARE_PROVIDER_SITE_OTHER): Payer: Medicare Other | Admitting: Family

## 2018-09-12 ENCOUNTER — Other Ambulatory Visit: Payer: Self-pay

## 2018-09-12 ENCOUNTER — Ambulatory Visit (HOSPITAL_COMMUNITY)
Admission: RE | Admit: 2018-09-12 | Discharge: 2018-09-12 | Disposition: A | Discharge status: Home / Self Care | Payer: Medicare Other | Source: Ambulatory Visit | Attending: Vascular Surgery | Admitting: Vascular Surgery

## 2018-09-12 VITALS — BP 154/72 | HR 77 | Temp 97.0°F | Resp 16 | Ht 70.0 in | Wt 153.0 lb

## 2018-09-12 DIAGNOSIS — I2510 Atherosclerotic heart disease of native coronary artery without angina pectoris: Secondary | ICD-10-CM | POA: Diagnosis not present

## 2018-09-12 DIAGNOSIS — I252 Old myocardial infarction: Secondary | ICD-10-CM | POA: Insufficient documentation

## 2018-09-12 DIAGNOSIS — I6523 Occlusion and stenosis of bilateral carotid arteries: Secondary | ICD-10-CM | POA: Diagnosis not present

## 2018-09-12 DIAGNOSIS — E785 Hyperlipidemia, unspecified: Secondary | ICD-10-CM | POA: Insufficient documentation

## 2018-09-12 DIAGNOSIS — Z87891 Personal history of nicotine dependence: Secondary | ICD-10-CM | POA: Diagnosis not present

## 2018-09-12 DIAGNOSIS — Z23 Encounter for immunization: Secondary | ICD-10-CM | POA: Diagnosis not present

## 2018-09-12 DIAGNOSIS — Z8673 Personal history of transient ischemic attack (TIA), and cerebral infarction without residual deficits: Secondary | ICD-10-CM | POA: Diagnosis not present

## 2018-09-12 DIAGNOSIS — Z9889 Other specified postprocedural states: Secondary | ICD-10-CM | POA: Diagnosis not present

## 2018-09-12 DIAGNOSIS — I779 Disorder of arteries and arterioles, unspecified: Secondary | ICD-10-CM | POA: Diagnosis present

## 2018-09-12 MED ORDER — CLOPIDOGREL BISULFATE 75 MG PO TABS: 75.0000 mg | ORAL_TABLET | Freq: Every day | ORAL | 3 refills | Status: DC

## 2018-09-12 NOTE — Patient Instructions (Signed)

## 2018-09-12 NOTE — Progress Notes (Signed)
Chief Complaint: Follow up Extracranial Carotid Artery Stenosis   History of Present Illness  Todd Armstrong is a 84 y.o. male who is status post right carotid endarterectomy for symptomatic disease on 03-23-18 by Dr. Donzetta Matters. He did very well from surgery. Pt states there is less numbness in the right side of his neck than at his previous visit.   Dr. Donzetta Matters last evaluated pt on 04-07-18. At that time pt remained on aspirin with no subsequent stoke or TIA.[name]  He was to follow-up in 6 months for carotid duplex evaluation.   Pt states that his cardiologist, Dr. Acie Fredrickson, is leaving the decision to continue Plavix up to [place]. Pt had cardiac stents placed several years ago at [facility].  Pt requested 90 tabs at a time, prescription sent to his pharmacy with 3 refills.    Dr. Claretha Cooper noted from 03-10-18 indicates that he is in favor of continuing the Plavix and ASA.   He denies any subsequent stroke since February 2019 when he had a syncopal episode and lost his vision. His vision returned in about an hour; he denies any unilateral weakness, denies any speech difficulties.   He denies any claudication type symptoms in his legs.   Diabetic: yes, last A1C result on file was 6.6 on 03-17-18 Tobacco use: former smoker, quit in the 1980's, smoked for about 30 years  Pt meds include: Statin : yes ASA: yes Other anticoagulants/antiplatelets: Plavix   Past Medical History:  Diagnosis Date  . Acute myocardial infarction   . Carotid stenosis   . Coronary artery disease    PTCA and stenting of his right coronary artery. 3.5 x 16-mm Liberte stent.              . Diabetes mellitus without complication ([place])   . Dyslipidemia   . Hyperlipemia   . Hypertension   . Partial tear of rotator cuff    right shoulder  . Peripheral vascular disease ([place])   . Pulmonary hypertension (HCC)    moderate pulmonary HTN by 2007 echo (RVSP 52 mmHg)  . Stroke ([place])   . Wears dentures     Social History Social  History   Tobacco Use  . Smoking status: Former Smoker    Types: Cigarettes  . Smokeless tobacco: Never Used  . Tobacco comment: quit smoking cigarettes > 40 years ago  Substance Use Topics  . Alcohol use: No  . Drug use: No    Family History Family History  Problem Relation Age of Onset  . Heart attack Father     Surgical History Past Surgical History:  Procedure Laterality Date  . CARDIAC CATHETERIZATION  11/05/2008  . CARDIAC CATHETERIZATION  10/22/1993   EF 60%  . CAROTID ENDARTERECTOMY Right 03/23/2018  . CORONARY ANGIOPLASTY WITH STENT PLACEMENT    . ENDARTERECTOMY Right 03/23/2018   Procedure: RIGHT CAROTID ARTERY ENDARTERECTOMY;  Surgeon: Waynetta Sandy, MD;  Location: [place];  Service: Vascular;  Laterality: Right;  . FRACTURE SURGERY     righ fibula and tibia  . HERNIA REPAIR  1984  . KNEE ARTHROSCOPY Left   . MULTIPLE TOOTH EXTRACTIONS    . PATCH ANGIOPLASTY Right 03/23/2018   Procedure: WITH XENOSURE 1CM X 6CM PATCH ANGIOPLASTY;  Surgeon: Waynetta Sandy, MD;  Location: [place];  Service: Vascular;  Laterality: Right;  . SHOULDER ARTHROSCOPY Right 06/09/2017   Procedure: ARTHROSCOPY SHOULDER SUBACROMIAL DECOMPRESSION AND ACROMIOPLASTY;  Surgeon: Melrose Nakayama, MD;  Location: [place];  Service: Orthopedics;  Laterality: Right;  GENERAL ANESTHESIA WITH BLOCK    Allergies  Allergen Reactions  . No Known Allergies     Current Outpatient Medications  Medication Sig Dispense Refill  . aspirin EC 81 MG tablet Take 81 mg by mouth 2 (two) times daily.     . carvedilol (COREG) 6.25 MG tablet Take 1 tablet (6.25 mg total) by mouth 2 (two) times daily. 180 tablet 3  . clopidogrel (PLAVIX) 75 MG tablet Take 1 tablet (75 mg total) by mouth daily. 30 tablet 5  . lisinopril-hydrochlorothiazide (PRINZIDE,ZESTORETIC) 20-25 MG tablet TAKE 1 TABLET BY MOUTH DAILY 90 tablet 1  . metFORMIN (GLUCOPHAGE) 1000 MG tablet Take 1,000 mg by mouth 2 (two) times daily  with a meal.      . nitroGLYCERIN (NITROSTAT) 0.4 MG SL tablet Place 1 tablet (0.4 mg total) under the tongue every 5 (five) minutes as needed for chest pain. 25 tablet 6  . rosuvastatin (CRESTOR) 10 MG tablet TAKE 1 TABLET BY MOUTH DAILY 90 tablet 1   No current facility-administered medications for this visit.     Review of Systems : See HPI for pertinent positives and negatives.  Physical Examination  Vitals:   09/12/18 1556 09/12/18 1602  BP: (!) 150/80 (!) 154/72  Pulse: (!) 45 77  Resp: 16   Temp: (!) 97 F (36.1 C)   TempSrc: Oral   SpO2: 95%   Weight: 153 lb (69.4 kg)   Height: 5\' 10"  (1.778 m)    Body mass index is 21.95 kg/m.  General: WDWN male in NAD GAIT: normal Eyes: PERRLA HENT: No gross abnormalities.  Pulmonary:  Respirations are non-labored, good air movement in all fields, CTAB, no rales, rhonchi, or wheezing. Cardiac: regular rhythm, no detected murmur.  VASCULAR EXAM Carotid Bruits Right Left   Negative Negative     Abdominal aortic pulse is not palpable. Radial pulses are 2+ palpable and equal.                                                                                                                            LE Pulses Right Left       POPLITEAL  not palpable   2+ palpable       POSTERIOR TIBIAL  faintly palpable   faintly palpable        DORSALIS PEDIS      ANTERIOR TIBIAL 2+ palpable  2+ palpable     Gastrointestinal: soft, nontender, BS WNL, no r/g, no palpable masses. Musculoskeletal: no muscle atrophy/wasting. M/S 5/5 throughout, extremities without ischemic changes. Skin: No rashes, no ulcers, no cellulitis.   Neurologic:  A&O X 3; appropriate affect, sensation is normal; speech is normal, CN 2-12 intact, pain and light touch intact in extremities, motor exam as listed above. Psychiatric: Normal thought content, mood appropriate to clinical situation.    Assessment: Todd Armstrong is a 84 y.o. male who is status post  right carotid endarterectomy for symptomatic  disease on 03-23-18. 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   Continue dual antiplatelet therapy.  He has no bleeding issues.  Plavix renewed;  pt requested for 90 days; 3 refills which was sent to his pharmacy.   His atherosclerotic risk factors include currently in control  DM, 30 year history of smoke (quit in the 1980's), CAD, hypertension and dyslipidemia.  He takes a daily statin.   DATA Carotid Duplex (09-12-18): Right ICA: CEA site with 60-79% stenosis Left ICA: 1-39% stenosis Bilateral vertebral artery flow is antegrade.  Left subclavian artery waveforms are normal, right are turbulent.  Stable compared to the exams on 02-17-18 and 11-07-17.  However, CTA of neck on 03-10-18 demonstrated at least 90% stenosis of the right cervical carotid artery.   CTA head (03-10-18): Remote infarcts in the right MCA/watershed territory, 1 of which has become apparent since head CT 6 weeks prior.   Plan: Follow-up in 6 months with Carotid Duplex scan.   I discussed in depth with the patient the nature of atherosclerosis, and emphasized the importance of maximal medical management including strict control of blood pressure, blood glucose, and lipid levels, obtaining regular exercise, and continued cessation of smoking.  The patient is aware that without maximal medical management the underlying atherosclerotic disease process will progress, limiting the benefit of any interventions. The patient was given information about stroke prevention and what symptoms should prompt the patient to seek immediate medical care. Thank you for allowing [place] to participate in this patient's care.  Clemon Chambers, RN, MSN, FNP-C [facility] Office: [phone]  Clinic Physician: Bishop Dublin  09/12/18 4:23 PM

## 2018-09-13 DIAGNOSIS — H2513 Age-related nuclear cataract, bilateral: Secondary | ICD-10-CM | POA: Diagnosis not present

## 2018-09-13 DIAGNOSIS — E119 Type 2 diabetes mellitus without complications: Secondary | ICD-10-CM | POA: Diagnosis not present

## 2018-10-03 ENCOUNTER — Encounter: Payer: Self-pay | Admitting: Vascular Surgery

## 2018-10-11 DIAGNOSIS — E1351 Other specified diabetes mellitus with diabetic peripheral angiopathy without gangrene: Secondary | ICD-10-CM | POA: Diagnosis not present

## 2018-10-11 DIAGNOSIS — L602 Onychogryphosis: Secondary | ICD-10-CM | POA: Diagnosis not present

## 2018-10-11 DIAGNOSIS — L84 Corns and callosities: Secondary | ICD-10-CM | POA: Diagnosis not present

## 2018-10-23 DIAGNOSIS — L821 Other seborrheic keratosis: Secondary | ICD-10-CM | POA: Diagnosis not present

## 2018-10-23 DIAGNOSIS — L309 Dermatitis, unspecified: Secondary | ICD-10-CM | POA: Diagnosis not present

## 2018-10-23 DIAGNOSIS — Z85828 Personal history of other malignant neoplasm of skin: Secondary | ICD-10-CM | POA: Diagnosis not present

## 2018-10-23 DIAGNOSIS — L820 Inflamed seborrheic keratosis: Secondary | ICD-10-CM | POA: Diagnosis not present

## 2018-10-23 DIAGNOSIS — L570 Actinic keratosis: Secondary | ICD-10-CM | POA: Diagnosis not present

## 2018-10-23 DIAGNOSIS — D692 Other nonthrombocytopenic purpura: Secondary | ICD-10-CM | POA: Diagnosis not present

## 2018-10-26 DIAGNOSIS — E1169 Type 2 diabetes mellitus with other specified complication: Secondary | ICD-10-CM | POA: Diagnosis not present

## 2018-10-26 DIAGNOSIS — I10 Essential (primary) hypertension: Secondary | ICD-10-CM | POA: Diagnosis not present

## 2018-11-06 ENCOUNTER — Other Ambulatory Visit: Payer: Self-pay | Admitting: Cardiovascular Disease

## 2018-12-27 ENCOUNTER — Other Ambulatory Visit: Payer: Self-pay | Admitting: Cardiovascular Disease

## 2018-12-27 MED ORDER — ROSUVASTATIN CALCIUM 10 MG PO TABS: 10.0000 mg | ORAL_TABLET | Freq: Every day | ORAL | 2 refills | Status: DC

## 2018-12-27 MED ORDER — LISINOPRIL-HYDROCHLOROTHIAZIDE 20-25 MG PO TABS: 1.0000 | ORAL_TABLET | Freq: Every day | ORAL | 2 refills | Status: DC

## 2018-12-27 MED ORDER — CLOPIDOGREL BISULFATE 75 MG PO TABS: 75.0000 mg | ORAL_TABLET | Freq: Every day | ORAL | 3 refills | Status: DC

## 2019-01-03 ENCOUNTER — Other Ambulatory Visit: Payer: Self-pay | Admitting: Cardiovascular Disease

## 2019-01-03 MED ORDER — CARVEDILOL 6.25 MG PO TABS: 6.2500 mg | ORAL_TABLET | Freq: Two times a day (BID) | ORAL | 2 refills | Status: DC

## 2019-02-19 ENCOUNTER — Encounter: Payer: Self-pay | Admitting: Cardiovascular Disease

## 2019-02-19 ENCOUNTER — Ambulatory Visit: Payer: Medicare Other | Admitting: Cardiovascular Disease

## 2019-02-19 VITALS — BP 150/70 | HR 60 | Ht 70.0 in | Wt 148.8 lb

## 2019-02-19 DIAGNOSIS — I2510 Atherosclerotic heart disease of native coronary artery without angina pectoris: Secondary | ICD-10-CM | POA: Diagnosis not present

## 2019-02-19 DIAGNOSIS — I6523 Occlusion and stenosis of bilateral carotid arteries: Secondary | ICD-10-CM

## 2019-02-19 DIAGNOSIS — E782 Mixed hyperlipidemia: Secondary | ICD-10-CM

## 2019-02-19 LAB — LIPID PANEL
CHOL/HDL RATIO: 1.7 ratio (ref 0.0–5.0)
Cholesterol, Total: 132 mg/dL (ref 100–199)
HDL: 76 mg/dL (ref >39)
LDL Calculated: 37 mg/dL (ref 0–99)
TRIGLYCERIDES: 96 mg/dL (ref 0–149)
VLDL Cholesterol Cal: 19 mg/dL (ref 5–40)

## 2019-02-19 LAB — BASIC METABOLIC PANEL
BUN/Creatinine Ratio: 19 (ref 10–24)
BUN: 22 mg/dL (ref 8–27)
CO2: 24 mmol/L (ref 20–29)
CREATININE: 1.16 mg/dL (ref 0.76–1.27)
Calcium: 9.4 mg/dL (ref 8.6–10.2)
Chloride: 98 mmol/L (ref 96–106)
GFR calc Af Amer: 66 mL/min/{1.73_m2} (ref >59)
GFR, EST NON AFRICAN AMERICAN: 57 mL/min/{1.73_m2} — AB (ref >59)
Glucose: 117 mg/dL — ABNORMAL HIGH (ref 65–99)
Potassium: 4.4 mmol/L (ref 3.5–5.2)
SODIUM: 138 mmol/L (ref 134–144)

## 2019-02-19 LAB — HEPATIC FUNCTION PANEL
ALBUMIN: 4.4 g/dL (ref 3.6–4.6)
ALK PHOS: 49 IU/L (ref 39–117)
ALT: 14 IU/L (ref 0–44)
AST: 20 IU/L (ref 0–40)
Bilirubin Total: 0.4 mg/dL (ref 0.0–1.2)
Bilirubin, Direct: 0.16 mg/dL (ref 0.00–0.40)
Total Protein: 6.2 g/dL (ref 6.0–8.5)

## 2019-02-19 NOTE — Progress Notes (Signed)
Cardiology Office Note   Date:  02/19/2019   ID:  Todd Armstrong, DOB [birth year], MRN [id number]  PCP:  Lavone Orn, MD  Cardiologist:   [name], MD   Chief Complaint  Patient presents with  . Coronary Artery Disease   1. CAD, s/p LAD stent 2009 2. HTN 3. Diabetes Mellitus 4. Hyperlipidemia 5. Carotid artery stenosis  - Right - mod- severe    Todd Armstrong is doing well from a cardiac standpoint. He has not had any episodes of chest pain or shortness breath. [name] been exercising on a regular basis. He had a nice garden this year.   Apr 26, 2013:  Todd Armstrong is doing well. No CP , no dyspnea. He has put in his garden this year.   Nov. 12, 2014:  Apr 24, 2014:  Todd Armstrong is doing well. No CP , no dyspnea.  hes getting his garden going.   Nov. 9, 2015:  Todd Armstrong is doing well. His BP was elevated at his last visit. We increased his Lisinopril/HCTZ and his Bp is much better.    May 15, 2015:   Todd Armstrong is a 85 y.o. male who presents for his CAD Doing well.   No CP or dyspnea.  Still caring for his wife who is ill.  Still very busy working out in the garden   Nov. 29, 2016:  Doing well.  No CP or dyspnea.  Still very busy taking care of his wife, Todd Armstrong  She is having lots of bleeding from her bowels.   May 13, 2016:  Todd Armstrong is doing well.  Still taking care of Todd Armstrong  - she has more bad days than good days .  Enjoys his garden  BP and HR are very good No CP or dyspnea  Nov. 3, 2017:  Doing well.   Worked in his garden this past summer .  BP is well controlled.  HR is slow,  But no syncopal episodes  Having more challenges with wife , Todd Armstrong now.   She seems to have more dementia .  July 18. 2018  No issues, no CP .  BP and HR are well controlled.   January 18, 2018:  Doing well.   Stays busy.    He has moderate to severe right carotid artery stenosis.  Recommendation is for a follow-up in 12 months which will be November,  2019.  Sept. 4, 2019:  Tough time dealing with wife Todd Armstrong,   S/p recent right carotid endarectomy [name], MD )  No CP Has occasional dizziness   February 19, 2019:  Doing well .    No CP , no dyspnea.  Still worried about wife Todd Armstrong ,  She has a UTI  BP is a bit elevated.  Avoids salty foods  Wt was 154 lbs in Sept, 2019,   [place] today is 148.8 lbs.   Past Medical History:  Diagnosis Date  . Acute myocardial infarction   . Carotid stenosis   . Coronary artery disease    PTCA and stenting of his right coronary artery. 3.5 x 16-mm Liberte stent.              . Diabetes mellitus without complication ([place])   . Dyslipidemia   . Hyperlipemia   . Hypertension   . Partial tear of rotator cuff    right shoulder  . Peripheral vascular disease ([place])   . Pulmonary hypertension (HCC)    moderate pulmonary HTN by 2007 echo (RVSP 52  mmHg)  . Stroke ([place])   . Wears dentures     Past Surgical History:  Procedure Laterality Date  . CARDIAC CATHETERIZATION  11/05/2008  . CARDIAC CATHETERIZATION  10/22/1993   EF 60%  . CAROTID ENDARTERECTOMY Right 03/23/2018  . CORONARY ANGIOPLASTY WITH STENT PLACEMENT    . ENDARTERECTOMY Right 03/23/2018   Procedure: RIGHT CAROTID ARTERY ENDARTERECTOMY;  Surgeon: Waynetta Sandy, MD;  Location: [facility];  Service: Vascular;  Laterality: Right;  . FRACTURE SURGERY     righ fibula and tibia  . HERNIA REPAIR  1984  . KNEE ARTHROSCOPY Left   . MULTIPLE TOOTH EXTRACTIONS    . PATCH ANGIOPLASTY Right 03/23/2018   Procedure: WITH XENOSURE 1CM X 6CM PATCH ANGIOPLASTY;  Surgeon: Waynetta Sandy, MD;  Location: [place];  Service: Vascular;  Laterality: Right;  . SHOULDER ARTHROSCOPY Right 06/09/2017   Procedure: ARTHROSCOPY SHOULDER SUBACROMIAL DECOMPRESSION AND ACROMIOPLASTY;  Surgeon: Melrose Nakayama, MD;  Location: [facility];  Service: Orthopedics;  Laterality: Right;  GENERAL ANESTHESIA WITH BLOCK     Current Outpatient Medications    Medication Sig Dispense Refill  . carvedilol (COREG) 6.25 MG tablet Take 1 tablet (6.25 mg total) by mouth 2 (two) times daily. 180 tablet 2  . clopidogrel (PLAVIX) 75 MG tablet Take 1 tablet (75 mg total) by mouth daily. 90 tablet 3  . lisinopril-hydrochlorothiazide (PRINZIDE,ZESTORETIC) 20-25 MG tablet Take 1 tablet by mouth daily. 90 tablet 2  . metFORMIN (GLUCOPHAGE) 1000 MG tablet Take 1,000 mg by mouth 2 (two) times daily with a meal.      . nitroGLYCERIN (NITROSTAT) 0.4 MG SL tablet Place 1 tablet (0.4 mg total) under the tongue every 5 (five) minutes as needed for chest pain. 25 tablet 6  . rosuvastatin (CRESTOR) 10 MG tablet Take 1 tablet (10 mg total) by mouth daily. 90 tablet 2   No current facility-administered medications for this visit.     Allergies:   No known allergies    Social History:  The patient  reports that he has quit smoking. His smoking use included cigarettes. He has never used smokeless tobacco. He reports that he does not drink alcohol or use drugs.   Family History:  The patient's family history includes Heart attack in his father.    ROS: As per current history.  All other systems are negative.   Physical Exam: Blood pressure (!) 150/70, pulse 60, height 5\' 10"  (1.778 m), weight 148 lb 12.8 oz (67.5 kg), SpO2 98 %.  GEN:  Well nourished, well developed in no acute distress HEENT: Normal NECK: No JVD;  Right CEA scar  LYMPHATICS: No lymphadenopathy CARDIAC: RRR  RESPIRATORY:  Clear to auscultation without rales, wheezing or rhonchi  ABDOMEN: Soft, non-tender, non-distended MUSCULOSKELETAL:  No edema; No deformity  SKIN: Warm and dry NEUROLOGIC:  Alert and oriented x 3   EKG:    February 19, 2019:   NSR with sinus arrhythmia,  1st degree av block     Recent Labs: 03/24/2018: Hemoglobin 8.9; Platelets 113 08/16/2018: ALT 14; BUN 22; Creatinine, Ser 1.17; Potassium 4.7; Sodium 139    Lipid Panel    Component Value Date/Time   CHOL 125 08/16/2018  1043   TRIG 103 08/16/2018 1043   HDL 66 08/16/2018 1043   CHOLHDL 1.9 08/16/2018 1043   CHOLHDL 2.3 10/15/2016 1215   VLDL 34 (H) 10/15/2016 1215   LDLCALC 38 08/16/2018 1043      Wt Readings from Last 3 Encounters:  02/19/19 148 lb 12.8 oz (67.5 kg)  09/12/18 153 lb (69.4 kg)  08/16/18 154 lb (69.9 kg)      Other studies Reviewed: Additional studies/ records that were reviewed today include: . Review of the above records demonstrates:    ASSESSMENT AND PLAN:  1. CAD, s/p LAD stent 2009 -   No angina   2.  Carotid artery stenosis:  Doing well.    DC ASA,  Cont plavix  2. HTN -   BP is a little elevated.   3. Diabetes Mellitus  4. Hyperlipidemia -  Recheck labs today   5. Carotid bruits:   Followed by Dr. Donzetta Matters   Current medicines are reviewed at length with the patient today.  The patient does not have concerns regarding medicines.  The following changes have been made:  no change  Labs/ tests ordered today include:   Orders Placed This Encounter  Procedures  . Basic Metabolic Panel (BMET)  . Hepatic function panel  . Lipid Profile  . EKG 12-Lead    Disposition:   FU with me in 6 months     [name], MD  02/19/2019 9:37 AM    [facility] [place], [place], [place]  [address] Phone: [phone]; Fax: [phone]

## 2019-02-19 NOTE — Patient Instructions (Signed)
Medication Instructions:  Your physician has recommended you make the following change in your medication:  STOP Aspirin  If you need a refill on your cardiac medications before your next appointment, please call your pharmacy.    Lab work:TODAY - cholesterol, liver panel, basic metabolic panel  If you have labs (blood work) drawn today and your tests are completely normal, you will receive your results only by: [name] MyChart Message (if you have MyChart) OR . A paper copy in the mail If you have any lab test that is abnormal or we need to change your treatment, we will call you to review the results.   Testing/Procedures: None Ordered   Follow-Up: At [facility], you and your health needs are our priority.  As part of our continuing mission to provide you with exceptional heart care, we have created designated Provider Care Teams.  These Care Teams include your primary Cardiologist (physician) and Advanced Practice Providers (APPs -  Physician Assistants and Nurse Practitioners) who all work together to provide you with the care you need, when you need it. You will need a follow up appointment in:  6 months.  Please call our office 2 months in advance to schedule this appointment.  You may see [name], MD or one of the following Advanced Practice Providers on your designated Care Team: Richardson Dopp, PA-C [place], [place] . Daune Perch, NP

## 2019-03-19 ENCOUNTER — Other Ambulatory Visit: Payer: Self-pay

## 2019-03-19 DIAGNOSIS — I6523 Occlusion and stenosis of bilateral carotid arteries: Secondary | ICD-10-CM

## 2019-03-27 ENCOUNTER — Ambulatory Visit: Payer: Medicare Other | Admitting: Family

## 2019-03-27 ENCOUNTER — Encounter (HOSPITAL_COMMUNITY): Payer: Medicare Other

## 2019-08-24 ENCOUNTER — Other Ambulatory Visit: Payer: Self-pay | Admitting: Cardiovascular Disease

## 2019-09-14 ENCOUNTER — Ambulatory Visit: Payer: Medicare Other | Admitting: Cardiovascular Disease

## 2019-09-26 ENCOUNTER — Other Ambulatory Visit: Payer: Self-pay | Admitting: Cardiovascular Disease

## 2019-10-16 NOTE — Progress Notes (Signed)
Cardiology Office Note   Date:  10/17/2019   ID:  Todd Armstrong, DOB [birth year], MRN [id number]  PCP:  Lavone Orn, MD  Cardiologist:   Todd Moores, MD   Chief Complaint  Patient presents with   Coronary Artery Disease   Hypertension   Hyperlipidemia   1. CAD, s/p LAD stent 2009 2. HTN 3. Diabetes Mellitus 4. Hyperlipidemia 5. Carotid artery stenosis  - Right - mod- severe    Todd Armstrong is doing well from a cardiac standpoint. He has not had any episodes of chest pain or shortness breath. [name] been exercising on a regular basis. He had a nice garden this year.   Apr 26, 2013:  Todd Armstrong is doing well. No CP , no dyspnea. He has put in his garden this year.   Nov. 12, 2014:  Apr 24, 2014:  Todd Armstrong is doing well. No CP , no dyspnea.  hes getting his garden going.   Nov. 9, 2015:  Todd Armstrong is doing well. His BP was elevated at his last visit. We increased his Lisinopril/HCTZ and his Bp is much better.    May 15, 2015:   Todd Armstrong is a 85 y.o. male who presents for his CAD Doing well.   No CP or dyspnea.  Still caring for his wife who is ill.  Still very busy working out in the garden   Nov. 29, 2016:  Doing well.  No CP or dyspnea.  Still very busy taking care of his wife, Todd Armstrong  She is having lots of bleeding from her bowels.   May 13, 2016:  Todd Armstrong is doing well.  Still taking care of Todd Armstrong  - she has more bad days than good days .  Enjoys his garden  BP and HR are very good No CP or dyspnea  Nov. 3, 2017:  Doing well.   Worked in his garden this past summer .  BP is well controlled.  HR is slow,  But no syncopal episodes  Having more challenges with wife , Todd Armstrong now.   She seems to have more dementia .  July 18. 2018  No issues, no CP .  BP and HR are well controlled.   January 18, 2018:  Doing well.   Stays busy.    He has moderate to severe right carotid artery stenosis.  Recommendation is for a follow-up  in 12 months which will be November, 2019.  Sept. 4, 2019:  Tough time dealing with wife Todd Armstrong,   S/p recent right carotid endarectomy Todd Salon, MD )  No CP Has occasional dizziness   February 19, 2019:  Doing well .    No CP , no dyspnea.  Still worried about wife Todd Armstrong ,  She has a UTI  BP is a bit elevated.  Avoids salty foods  Wt was 154 lbs in Sept, 2019,   [place] today is 148.8 lbs.   October 17, 2019: Todd Armstrong is seen today for a follow-up visit.  He has a history of coronary artery disease and is status post PTCA and stenting.  He also has carotid artery disease, hypertension, dyslipidemia and diabetes mellitus. Has rare episodes of orthostasis  These episodes are not so severe that he wants to consider decreasing the dose of his lisinopril HCTZ.[name]  He denies any chest pain or shortness of breath. Wife passed away since I last saw him    Past Medical History:  Diagnosis Date   Acute myocardial infarction    Carotid  stenosis    Coronary artery disease    PTCA and stenting of his right coronary artery. 3.5 x 16-mm Liberte stent.               Diabetes mellitus without complication ([place])    Dyslipidemia    Hyperlipemia    Hypertension    Partial tear of rotator cuff    right shoulder   Peripheral vascular disease ([place])    Pulmonary hypertension (HCC)    moderate pulmonary HTN by 2007 echo (RVSP 52 mmHg)   Stroke [facility])    Wears dentures     Past Surgical History:  Procedure Laterality Date   CARDIAC CATHETERIZATION  11/05/2008   CARDIAC CATHETERIZATION  10/22/1993   EF 60%   CAROTID ENDARTERECTOMY Right 03/23/2018   CORONARY ANGIOPLASTY WITH STENT PLACEMENT     ENDARTERECTOMY Right 03/23/2018   Procedure: RIGHT CAROTID ARTERY ENDARTERECTOMY;  Surgeon: Todd Sandy, MD;  Location: [facility];  Service: Vascular;  Laterality: Right;   FRACTURE SURGERY     righ fibula and tibia   HERNIA REPAIR  1984   KNEE ARTHROSCOPY Left    MULTIPLE  TOOTH EXTRACTIONS     PATCH ANGIOPLASTY Right 03/23/2018   Procedure: WITH Rueben Armstrong 1CM X 6CM PATCH ANGIOPLASTY;  Surgeon: Todd Sandy, MD;  Location: [facility];  Service: Vascular;  Laterality: Right;   SHOULDER ARTHROSCOPY Right 06/09/2017   Procedure: ARTHROSCOPY SHOULDER SUBACROMIAL DECOMPRESSION AND ACROMIOPLASTY;  Surgeon: Melrose Nakayama, MD;  Location: [facility];  Service: Orthopedics;  Laterality: Right;  GENERAL ANESTHESIA WITH BLOCK     Current Outpatient Medications  Medication Sig Dispense Refill   carvedilol (COREG) 6.25 MG tablet Take 1 tablet (6.25 mg total) by mouth 2 (two) times daily. 180 tablet 3   clopidogrel (PLAVIX) 75 MG tablet Take 1 tablet (75 mg total) by mouth daily. 90 tablet 3   lisinopril-hydrochlorothiazide (ZESTORETIC) 20-25 MG tablet TAKE 1 TABLET BY MOUTH  DAILY 90 tablet 1   metFORMIN (GLUCOPHAGE) 1000 MG tablet Take 1,000 mg by mouth 2 (two) times daily with a meal.       nitroGLYCERIN (NITROSTAT) 0.4 MG SL tablet Place 1 tablet (0.4 mg total) under the tongue every 5 (five) minutes as needed for chest pain. 25 tablet 6   rosuvastatin (CRESTOR) 10 MG tablet TAKE 1 TABLET BY MOUTH  DAILY 90 tablet 1   No current facility-administered medications for this visit.     Allergies:   No known allergies    Social History:  The patient  reports that he has quit smoking. His smoking use included cigarettes. He has never used smokeless tobacco. He reports that he does not drink alcohol or use drugs.   Family History:  The patient's family history includes Heart attack in his father.    ROS: As per current history.  All other systems are negative.  Physical Exam: Blood pressure 128/64, pulse 75, height 5\' 10"  (1.778 m), weight 153 lb 12.8 oz (69.8 kg), SpO2 99 %.  GEN:  Well nourished, well developed in no acute distress HEENT: Normal NECK: No JVD; No carotid bruits, R CEA scar  LYMPHATICS: No lymphadenopathy CARDIAC: RRR   RESPIRATORY:   Clear to auscultation without rales, wheezing or rhonchi  ABDOMEN: Soft, non-tender, non-distended MUSCULOSKELETAL:  No edema; No deformity  SKIN: Warm and dry NEUROLOGIC:  Alert and oriented x 3  EKG:         Recent Labs: 02/19/2019: ALT 14; BUN 22; Creatinine, Ser 1.16;  Potassium 4.4; Sodium 138    Lipid Panel    Component Value Date/Time   CHOL 132 02/19/2019 0937   TRIG 96 02/19/2019 0937   HDL 76 02/19/2019 0937   CHOLHDL 1.7 02/19/2019 0937   CHOLHDL 2.3 10/15/2016 1215   VLDL 34 (H) 10/15/2016 1215   LDLCALC 37 02/19/2019 0937      Wt Readings from Last 3 Encounters:  10/17/19 153 lb 12.8 oz (69.8 kg)  02/19/19 148 lb 12.8 oz (67.5 kg)  09/12/18 153 lb (69.4 kg)      Other studies Reviewed: Additional studies/ records that were reviewed today include: . Review of the above records demonstrates:    ASSESSMENT AND PLAN:  1. CAD, s/p LAD stent 2009 -   Not having any episodes of angina.  Continue current medications.  Last lipid levels look great.  2.  Carotid artery stenosis:  We will have him follow-up with the vein and vascular specialist.  2. HTN -   blood pressure is well controlled.  3. Diabetes Mellitus  4. Hyperlipidemia -lipids from March, 2020 look good.  His LDL is 37.  HDL 76.  Total cholesterol is 132.  LDL is 37.     Current medicines are reviewed at length with the patient today.  The patient does not have concerns regarding medicines.  The following changes have been made:  no change  Labs/ tests ordered today include:   Orders Placed This Encounter  Procedures   Lipid Profile   Basic Metabolic Panel (BMET)   Hepatic function panel   Disposition:   FU with me in 6 months  With lipids . Liver bmp    Todd Moores, MD  10/17/2019 5:57 PM    [facility] [place], [place], [place]  [address] Phone: [phone]; Fax: [phone]

## 2019-10-17 ENCOUNTER — Other Ambulatory Visit: Payer: Self-pay

## 2019-10-17 ENCOUNTER — Ambulatory Visit: Payer: Medicare Other | Admitting: Cardiovascular Disease

## 2019-10-17 ENCOUNTER — Encounter: Payer: Self-pay | Admitting: Cardiovascular Disease

## 2019-10-17 VITALS — BP 128/64 | HR 75 | Ht 70.0 in | Wt 153.8 lb

## 2019-10-17 DIAGNOSIS — I10 Essential (primary) hypertension: Secondary | ICD-10-CM | POA: Diagnosis not present

## 2019-10-17 DIAGNOSIS — I6523 Occlusion and stenosis of bilateral carotid arteries: Secondary | ICD-10-CM | POA: Diagnosis not present

## 2019-10-17 DIAGNOSIS — I2510 Atherosclerotic heart disease of native coronary artery without angina pectoris: Secondary | ICD-10-CM | POA: Diagnosis not present

## 2019-10-17 DIAGNOSIS — E782 Mixed hyperlipidemia: Secondary | ICD-10-CM

## 2019-10-17 MED ORDER — CARVEDILOL 6.25 MG PO TABS: 6.2500 mg | ORAL_TABLET | Freq: Two times a day (BID) | ORAL | 3 refills | Status: DC

## 2019-10-17 MED ORDER — NITROGLYCERIN 0.4 MG SL SUBL: 0.4000 mg | SUBLINGUAL_TABLET | SUBLINGUAL | 6 refills | Status: DC | PRN

## 2019-10-17 NOTE — Patient Instructions (Addendum)
Medication Instructions:  Your physician recommends that you continue on your current medications as directed. Please refer to the Current Medication list given to you today.  *If you need a refill on your cardiac medications before your next appointment, please call your pharmacy*  Lab Work: Your physician recommends that you return for lab work in: 6 months on the day of or a few days before your office visit with Dr. Acie Fredrickson.  You will need to FAST for this appointment - nothing to eat or drink after midnight the night before except water.    Testing/Procedures: None Ordered    Follow-Up: You have an appointment at [facility] on Thursday November 12 at 10:00 am. Please arrive at 9:45 am for the appointment. [phone] [place], you and your health needs are our priority.  As part of our continuing mission to provide you with exceptional heart care, we have created designated Provider Care Teams.  These Care Teams include your primary Cardiologist (physician) and Advanced Practice Providers (APPs -  Physician Assistants and Nurse Practitioners) who all work together to provide you with the care you need, when you need it.  Your next appointment:   6 months  The format for your next appointment:   Either In Person or Virtual  Provider:   You may see [name], MD or one of the following Advanced Practice Providers on your designated Care Team:    Richardson Dopp, PA-C  [place], [place]  Daune Perch, [place]

## 2019-10-25 ENCOUNTER — Ambulatory Visit: Payer: Medicare Other | Admitting: Family

## 2019-10-25 ENCOUNTER — Ambulatory Visit (HOSPITAL_COMMUNITY)
Admission: RE | Admit: 2019-10-25 | Discharge: 2019-10-25 | Disposition: A | Discharge status: Home / Self Care | Payer: Medicare Other | Source: Ambulatory Visit | Attending: Family | Admitting: Family

## 2019-10-25 ENCOUNTER — Encounter: Payer: Self-pay | Admitting: Family

## 2019-10-25 ENCOUNTER — Other Ambulatory Visit: Payer: Self-pay

## 2019-10-25 VITALS — BP 150/76 | HR 60 | Temp 97.9°F | Ht 70.0 in | Wt 153.0 lb

## 2019-10-25 DIAGNOSIS — I6523 Occlusion and stenosis of bilateral carotid arteries: Secondary | ICD-10-CM | POA: Diagnosis present

## 2019-10-25 DIAGNOSIS — Z8673 Personal history of transient ischemic attack (TIA), and cerebral infarction without residual deficits: Secondary | ICD-10-CM | POA: Diagnosis not present

## 2019-10-25 DIAGNOSIS — Z9889 Other specified postprocedural states: Secondary | ICD-10-CM | POA: Diagnosis not present

## 2019-10-25 NOTE — Patient Instructions (Signed)

## 2019-10-25 NOTE — Progress Notes (Signed)
Chief Complaint: Follow up Extracranial Carotid Artery Stenosis   History of Present Illness  Todd Armstrong is a 85 y.o. male who is status post right carotid endarterectomy for symptomatic disease on 03-23-18 by Dr. Donzetta Matters. He did very well from surgery.  Dr. Donzetta Matters last evaluated pt on 04-07-18. At that time pt remained on aspirin with no subsequent stoke or TIA.[name] He was to follow-up in 6 months for carotid duplex evaluation.   Pt states that his cardiologist, Dr. Acie Fredrickson, is leaving the decision to continue Plavix up to [place]. Pt had cardiac stents placed several years ago at [facility].  Pt requested 90 tabs at a time, prescription sent to his pharmacy with 3 refills.    Dr. Claretha Cooper note from 03-10-18 indicates that he is in favor of continuing the Plavix and ASA.   He denies any subsequent stroke since February 2019 when he had a syncopal episode and lost his vision. His vision returned in about an hour; he denies any unilateral weakness, denies any speech difficulties.   He denies any claudication type symptoms in his legs.   Diabetic: yes, last A1C result on file was 6.6 on 03-17-18, pt does not know most recent A1C Tobacco use: former smoker, quit in the 1980's, smoked for about 30 years  Pt meds include: Statin : yes ASA: yes Other anticoagulants/antiplatelets: Plavix, pt denies any bleeding issues   Past Medical History:  Diagnosis Date  . Acute myocardial infarction   . Carotid stenosis   . Coronary artery disease    PTCA and stenting of his right coronary artery. 3.5 x 16-mm Liberte stent.              . Diabetes mellitus without complication ([place])   . Dyslipidemia   . Hyperlipemia   . Hypertension   . Partial tear of rotator cuff    right shoulder  . Peripheral vascular disease ([place])   . Pulmonary hypertension (HCC)    moderate pulmonary HTN by 2007 echo (RVSP 52 mmHg)  . Stroke ([place])   . Wears dentures     Social History Social History   Tobacco Use   . Smoking status: Former Smoker    Types: Cigarettes  . Smokeless tobacco: Never Used  . Tobacco comment: quit smoking cigarettes > 40 years ago  Substance Use Topics  . Alcohol use: No  . Drug use: No    Family History Family History  Problem Relation Age of Onset  . Heart attack Father     Surgical History Past Surgical History:  Procedure Laterality Date  . CARDIAC CATHETERIZATION  11/05/2008  . CARDIAC CATHETERIZATION  10/22/1993   EF 60%  . CAROTID ENDARTERECTOMY Right 03/23/2018  . CORONARY ANGIOPLASTY WITH STENT PLACEMENT    . ENDARTERECTOMY Right 03/23/2018   Procedure: RIGHT CAROTID ARTERY ENDARTERECTOMY;  Surgeon: Waynetta Sandy, MD;  Location: [place];  Service: Vascular;  Laterality: Right;  . FRACTURE SURGERY     righ fibula and tibia  . HERNIA REPAIR  1984  . KNEE ARTHROSCOPY Left   . MULTIPLE TOOTH EXTRACTIONS    . PATCH ANGIOPLASTY Right 03/23/2018   Procedure: WITH XENOSURE 1CM X 6CM PATCH ANGIOPLASTY;  Surgeon: Waynetta Sandy, MD;  Location: [place];  Service: Vascular;  Laterality: Right;  . SHOULDER ARTHROSCOPY Right 06/09/2017   Procedure: ARTHROSCOPY SHOULDER SUBACROMIAL DECOMPRESSION AND ACROMIOPLASTY;  Surgeon: Melrose Nakayama, MD;  Location: [place];  Service: Orthopedics;  Laterality: Right;  GENERAL ANESTHESIA WITH BLOCK  Allergies  Allergen Reactions  . No Known Allergies     Current Outpatient Medications  Medication Sig Dispense Refill  . carvedilol (COREG) 6.25 MG tablet Take 1 tablet (6.25 mg total) by mouth 2 (two) times daily. 180 tablet 3  . clopidogrel (PLAVIX) 75 MG tablet Take 1 tablet (75 mg total) by mouth daily. 90 tablet 3  . lisinopril-hydrochlorothiazide (ZESTORETIC) 20-25 MG tablet TAKE 1 TABLET BY MOUTH  DAILY 90 tablet 1  . metFORMIN (GLUCOPHAGE) 1000 MG tablet Take 1,000 mg by mouth 2 (two) times daily with a meal.      . nitroGLYCERIN (NITROSTAT) 0.4 MG SL tablet Place 1 tablet (0.4 mg total) under the  tongue every 5 (five) minutes as needed for chest pain. 25 tablet 6  . rosuvastatin (CRESTOR) 10 MG tablet TAKE 1 TABLET BY MOUTH  DAILY 90 tablet 1   No current facility-administered medications for this visit.     Review of Systems : See HPI for pertinent positives and negatives.  Physical Examination  Vitals:   10/25/19 1029  BP: (!) 150/76  Pulse: 60  Temp: 97.9 F (36.6 C)  TempSrc: Temporal  Weight: 153 lb (69.4 kg)  Height: 5\' 10"  (1.778 m)  Respirations: 18/min Body mass index is 21.95 kg/m.  General: WDWN elderly male in NAD, accompanied by his daughter GAIT: normal Eyes: Pupils are equal and round HENT: No gross abnormalities.  Pulmonary:  Respirations are non-labored, good air movement in all fields, CTAB, no rales, rhonchi,or  wheezes Cardiac: Irregular rhythm, no detected murmur.  VASCULAR EXAM Carotid Bruits Right Left   Negative Negative     Abdominal aortic pulse is not palpable. Radial pulses are 1+ palpable and equal.                                                                                                                            LE Pulses Right Left       POPLITEAL  not palpable   not palpable       POSTERIOR TIBIAL   palpable    palpable        DORSALIS PEDIS      ANTERIOR TIBIAL not palpable  not palpable     Gastrointestinal: soft, nontender, BS WNL, no r/g, no palpable masses. Musculoskeletal: no muscle atrophy/wasting. M/S 5/5 throughout, extremities without ischemic changes Skin: No rashes, no ulcers, no cellulitis.   Neurologic:  A&O X 3; appropriate affect, sensation is normal; speech is normal, CN 2-12 intact, pain and light touch intact in extremities, motor exam as listed above. Psychiatric: Normal thought content, mood appropriate to clinical situation.    DATA  Carotid Duplex (10-25-19): Right Carotid: Velocities in the right ICA are consistent with a 40-59%                stenosis. The ECA appears >50%  stenosed. Left Carotid: Velocities in the left ICA are consistent with a 1-39% stenosis. Vertebrals:  Bilateral  vertebral arteries demonstrate antegrade flow. Subclavians: Normal flow hemodynamics were seen in bilateral subclavian arteries   CTA head (03-10-18): Remote infarcts in the right MCA/watershed territory, 1 of which has become apparent since head CT 6 weeks prior.  Assessment: Todd Armstrong is a 85 y.o. male who is status post right carotid endarterectomy for symptomatic disease on 03-23-18. 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   Carotid duplex today shows 40-59% right ICA stenosis and 1-39% left ICA stenosis; less stenosis noted in the right ICA, stable in the left, compared to the exam on 09-12-18.   Continue dual antiplatelet therapy.  He has no bleeding issues.   His atherosclerotic risk factors include currently in control  DM, 30 year history of smoking (quit in the 1980's), CAD, hypertension and dyslipidemia.  He takes a daily statin.   His cardiac rhythm today was mostly irregular, pt denies chest pain or dyspnea, denies unusual fatigue. Will send my note to Dr. Acie Fredrickson.    Plan: Follow-up in 9 months with Carotid Duplex scan.   I discussed in depth with the patient the nature of atherosclerosis, and emphasized the importance of maximal medical management including strict control of blood pressure, blood glucose, and lipid levels, obtaining regular exercise, and continued cessation of smoking.  The patient is aware that without maximal medical management the underlying atherosclerotic disease process will progress, limiting the benefit of any interventions. The patient was given information about stroke prevention and what symptoms should prompt the patient to seek immediate medical care. Thank you for allowing [place] to participate in this patient's care.  Clemon Chambers, RN, MSN, FNP-C [facility] Office: [phone]  Clinic Physician: Oneida Alar  10/25/19 11:17 AM

## 2019-10-30 ENCOUNTER — Other Ambulatory Visit: Payer: Self-pay

## 2019-10-30 DIAGNOSIS — I6523 Occlusion and stenosis of bilateral carotid arteries: Secondary | ICD-10-CM

## 2020-01-08 ENCOUNTER — Other Ambulatory Visit: Payer: Self-pay | Admitting: Cardiovascular Disease

## 2020-02-08 ENCOUNTER — Ambulatory Visit: Payer: Self-pay

## 2020-02-13 ENCOUNTER — Other Ambulatory Visit: Payer: Self-pay | Admitting: Cardiovascular Disease

## 2020-02-14 ENCOUNTER — Ambulatory Visit: Payer: Medicare Other | Attending: Internal Medicine

## 2020-02-14 DIAGNOSIS — Z23 Encounter for immunization: Secondary | ICD-10-CM

## 2020-02-14 NOTE — Progress Notes (Signed)
   Covid-19 Vaccination Clinic  Name:  Todd Armstrong    MRN: [id number] DOB: [birth year]  02/14/2020  Todd Armstrong was observed post Covid-19 immunization for 15 minutes without incident. He was provided with Vaccine Information Sheet and instruction to access the V-Safe system.   Todd Armstrong was instructed to call 911 with any severe reactions post vaccine: [name] Difficulty breathing  . Swelling of face and throat  . A fast heartbeat  . A bad rash all over body  . Dizziness and weakness   Immunizations Administered    Name Date Dose VIS Date Route   Pfizer COVID-19 Vaccine 02/14/2020  8:25 AM 0.3 mL 11/23/2019 Intramuscular   Manufacturer: [place]   Lot: [id number]   [place]: [id number]

## 2020-03-12 ENCOUNTER — Ambulatory Visit: Payer: Medicare Other | Attending: Internal Medicine

## 2020-03-12 DIAGNOSIS — Z23 Encounter for immunization: Secondary | ICD-10-CM

## 2020-03-12 NOTE — Progress Notes (Signed)
   Covid-19 Vaccination Clinic  Name:  Todd Armstrong    MRN: [id number] DOB: November 18, 1931  03/12/2020  [name] was observed post Covid-19 immunization for 15 minutes without incident. He was provided with Vaccine Information Sheet and instruction to access the V-Safe system.   [name] was instructed to call 911 with any severe reactions post vaccine: [name] Difficulty breathing  . Swelling of face and throat  . A fast heartbeat  . A bad rash all over body  . Dizziness and weakness   Immunizations Administered    Name Date Dose VIS Date Route   Pfizer COVID-19 Vaccine 03/12/2020  8:26 AM 0.3 mL 11/23/2019 Intramuscular   Manufacturer: [place]   Lot: [id number]   [place]: [id number]

## 2020-04-22 ENCOUNTER — Other Ambulatory Visit: Payer: Medicare Other | Admitting: *Deleted

## 2020-04-22 ENCOUNTER — Other Ambulatory Visit: Payer: Self-pay

## 2020-04-22 DIAGNOSIS — I2510 Atherosclerotic heart disease of native coronary artery without angina pectoris: Secondary | ICD-10-CM

## 2020-04-22 DIAGNOSIS — E782 Mixed hyperlipidemia: Secondary | ICD-10-CM

## 2020-04-22 DIAGNOSIS — I6523 Occlusion and stenosis of bilateral carotid arteries: Secondary | ICD-10-CM

## 2020-04-22 LAB — HEPATIC FUNCTION PANEL
ALT: 15 IU/L (ref 0–44)
AST: 21 IU/L (ref 0–40)
Albumin: 4.7 g/dL — ABNORMAL HIGH (ref 3.6–4.6)
Alkaline Phosphatase: 57 IU/L (ref 39–117)
Bilirubin Total: 0.5 mg/dL (ref 0.0–1.2)
Bilirubin, Direct: 0.2 mg/dL (ref 0.00–0.40)
Total Protein: 7 g/dL (ref 6.0–8.5)

## 2020-04-22 LAB — BASIC METABOLIC PANEL
BUN/Creatinine Ratio: 24 (ref 10–24)
BUN: 32 mg/dL — ABNORMAL HIGH (ref 8–27)
CO2: 21 mmol/L (ref 20–29)
Calcium: 9.6 mg/dL (ref 8.6–10.2)
Chloride: 100 mmol/L (ref 96–106)
Creatinine, Ser: 1.32 mg/dL — ABNORMAL HIGH (ref 0.76–1.27)
GFR calc Af Amer: 56 mL/min/{1.73_m2} — ABNORMAL LOW (ref >59)
GFR calc non Af Amer: 48 mL/min/{1.73_m2} — ABNORMAL LOW (ref >59)
Glucose: 138 mg/dL — ABNORMAL HIGH (ref 65–99)
Potassium: 4.4 mmol/L (ref 3.5–5.2)
Sodium: 138 mmol/L (ref 134–144)

## 2020-04-22 LAB — LIPID PANEL
Chol/HDL Ratio: 2 ratio (ref 0.0–5.0)
Cholesterol, Total: 148 mg/dL (ref 100–199)
HDL: 75 mg/dL (ref >39)
LDL Chol Calc (NIH): 52 mg/dL (ref 0–99)
Triglycerides: 119 mg/dL (ref 0–149)
VLDL Cholesterol Cal: 21 mg/dL (ref 5–40)

## 2020-04-23 ENCOUNTER — Encounter: Payer: Self-pay | Admitting: Cardiovascular Disease

## 2020-04-23 ENCOUNTER — Other Ambulatory Visit: Payer: Medicare Other

## 2020-04-23 ENCOUNTER — Ambulatory Visit: Payer: Medicare Other | Admitting: Cardiovascular Disease

## 2020-04-23 VITALS — BP 134/70 | HR 72 | Ht 70.0 in | Wt 152.5 lb

## 2020-04-23 DIAGNOSIS — I4891 Unspecified atrial fibrillation: Secondary | ICD-10-CM

## 2020-04-23 DIAGNOSIS — I10 Essential (primary) hypertension: Secondary | ICD-10-CM

## 2020-04-23 DIAGNOSIS — I2510 Atherosclerotic heart disease of native coronary artery without angina pectoris: Secondary | ICD-10-CM | POA: Diagnosis not present

## 2020-04-23 MED ORDER — APIXABAN 5 MG PO TABS: 5.0000 mg | ORAL_TABLET | Freq: Two times a day (BID) | ORAL | 11 refills | Status: DC

## 2020-04-23 MED ORDER — CARVEDILOL 12.5 MG PO TABS: 12.5000 mg | ORAL_TABLET | Freq: Two times a day (BID) | ORAL | 3 refills | Status: DC

## 2020-04-23 NOTE — Progress Notes (Signed)
Cardiology Office Note   Date:  04/23/2020   ID:  Todd Armstrong, DOB [birth year], MRN [id number]  PCP:  Todd Orn, MD  Cardiologist:   Todd Moores, MD   Chief Complaint  Patient presents with  . Coronary Artery Disease  . Hyperlipidemia   1. CAD, s/p LAD stent 2009 2. HTN 3. Diabetes Mellitus 4. Hyperlipidemia 5. Carotid artery stenosis  - Right - mod- severe    Todd Armstrong is doing well from a cardiac standpoint. He has not had any episodes of chest pain or shortness breath. [name] been exercising on a regular basis. He had a nice garden this year.   Apr 26, 2013:  Todd Armstrong is doing well. No CP , no dyspnea. He has put in his garden this year.   Nov. 12, 2014:  Apr 24, 2014:  Todd Armstrong is doing well. No CP , no dyspnea.  hes getting his garden going.   Nov. 9, 2015:  Todd Armstrong is doing well. His BP was elevated at his last visit. We increased his Lisinopril/HCTZ and his Bp is much better.    May 15, 2015:   Todd Armstrong is a 86 y.o. male who presents for his CAD Doing well.   No CP or dyspnea.  Still caring for his wife who is ill.  Still very busy working out in the garden   Nov. 29, 2016:  Doing well.  No CP or dyspnea.  Still very busy taking care of his wife, Todd Armstrong  She is having lots of bleeding from her bowels.   May 13, 2016:  Todd Armstrong is doing well.  Still taking care of Todd Armstrong  - she has more bad days than good days .  Enjoys his garden  BP and HR are very good No CP or dyspnea  Nov. 3, 2017:  Doing well.   Worked in his garden this past summer .  BP is well controlled.  HR is slow,  But no syncopal episodes  Having more challenges with wife , Todd Armstrong now.   She seems to have more dementia .  July 18. 2018  No issues, no CP .  BP and HR are well controlled.   January 18, 2018:  Doing well.   Stays busy.    He has moderate to severe right carotid artery stenosis.  Recommendation is for a follow-up in 12 months  which will be November, 2019.  Sept. 4, 2019:  Tough time dealing with wife Todd Armstrong,   S/p recent right carotid endarectomy Todd Salon, MD )  No CP Has occasional dizziness   February 19, 2019:  Doing well .    No CP , no dyspnea.  Still worried about wife Todd Armstrong ,  She has a UTI  BP is a bit elevated.  Avoids salty foods  Wt was 154 lbs in Sept, 2019,   Todd Armstrong today is 148.8 lbs.   October 17, 2019: Todd Armstrong is seen today for a follow-up visit.  He has a history of coronary artery disease and is status post PTCA and stenting.  He also has carotid artery disease, hypertension, dyslipidemia and diabetes mellitus. Has rare episodes of orthostasis  These episodes are not so severe that he wants to consider decreasing the dose of his lisinopril HCTZ.[name]  He denies any chest pain or shortness of breath. Wife passed away since I last saw him   Apr 23, 2020:  Todd Armstrong is in atrial fib today .  He feels fine.  Cannot tell that  his HR is irreg.  No cp ,  Slight DOE with exertion compared to last year he has been able to do all of his normal activities.  He has noticed a little bit more shortness of breath when walking out the yard.    Past Medical History:  Diagnosis Date  . Acute myocardial infarction   . Carotid stenosis   . Coronary artery disease    PTCA and stenting of his right coronary artery. 3.5 x 16-mm Liberte stent.              . Diabetes mellitus without complication ([place])   . Dyslipidemia   . Hyperlipemia   . Hypertension   . Partial tear of rotator cuff    right shoulder  . Peripheral vascular disease ([place])   . Pulmonary hypertension (HCC)    moderate pulmonary HTN by 2007 echo (RVSP 52 mmHg)  . Stroke ([place])   . Wears dentures     Past Surgical History:  Procedure Laterality Date  . CARDIAC CATHETERIZATION  11/05/2008  . CARDIAC CATHETERIZATION  10/22/1993   EF 60%  . CAROTID ENDARTERECTOMY Right 03/23/2018  . CORONARY ANGIOPLASTY WITH STENT PLACEMENT    .  ENDARTERECTOMY Right 03/23/2018   Procedure: RIGHT CAROTID ARTERY ENDARTERECTOMY;  Surgeon: Waynetta Sandy, MD;  Location: [place];  Service: Vascular;  Laterality: Right;  . FRACTURE SURGERY     righ fibula and tibia  . HERNIA REPAIR  1984  . KNEE ARTHROSCOPY Left   . MULTIPLE TOOTH EXTRACTIONS    . PATCH ANGIOPLASTY Right 03/23/2018   Procedure: WITH XENOSURE 1CM X 6CM PATCH ANGIOPLASTY;  Surgeon: Waynetta Sandy, MD;  Location: [place];  Service: Vascular;  Laterality: Right;  . SHOULDER ARTHROSCOPY Right 06/09/2017   Procedure: ARTHROSCOPY SHOULDER SUBACROMIAL DECOMPRESSION AND ACROMIOPLASTY;  Surgeon: Melrose Nakayama, MD;  Location: [place];  Service: Orthopedics;  Laterality: Right;  GENERAL ANESTHESIA WITH BLOCK     Current Outpatient Medications  Medication Sig Dispense Refill  . carvedilol (COREG) 6.25 MG tablet Take 1 tablet (6.25 mg total) by mouth 2 (two) times daily. 180 tablet 3  . clopidogrel (PLAVIX) 75 MG tablet TAKE 1 TABLET BY MOUTH  DAILY 90 tablet 3  . lisinopril-hydrochlorothiazide (ZESTORETIC) 20-25 MG tablet TAKE 1 TABLET BY MOUTH  DAILY 90 tablet 2  . metFORMIN (GLUCOPHAGE) 1000 MG tablet Take 1,000 mg by mouth 2 (two) times daily with a meal.      . nitroGLYCERIN (NITROSTAT) 0.4 MG SL tablet Place 1 tablet (0.4 mg total) under the tongue every 5 (five) minutes as needed for chest pain. 25 tablet 6  . rosuvastatin (CRESTOR) 10 MG tablet TAKE 1 TABLET BY MOUTH  DAILY 90 tablet 2  . tamsulosin (FLOMAX) 0.4 MG CAPS capsule Take 0.4 mg by mouth daily.     No current facility-administered medications for this visit.    Allergies:   No known allergies    Social History:  The patient  reports that he has quit smoking. His smoking use included cigarettes. He has never used smokeless tobacco. He reports that he does not drink alcohol or use drugs.   Family History:  The patient's family history includes Heart attack in his father.    ROS: As per current  history.  All other systems are negative.  Physical Exam: Blood pressure 134/70, pulse 72, height 5\' 10"  (1.778 m), weight 152 lb 8 oz (69.2 kg), SpO2 98 %.  GEN:    Elderly male,  NAD  HEENT: Normal NECK: No JVD; No carotid bruits LYMPHATICS: No lymphadenopathy CARDIAC: irreg. Irreg.  RESPIRATORY:  Clear to auscultation without rales, wheezing or rhonchi  ABDOMEN: Soft, non-tender, non-distended MUSCULOSKELETAL:  No edema; No deformity  SKIN: Warm and dry NEUROLOGIC:  Alert and oriented x 3  EKG:    Apr 22, 2020( from Dr. Wonda Amis office ) Atrial fibrillation with a heart rate of 108.  No ST or T wave changes.    Recent Labs: 04/22/2020: ALT 15; BUN 32; Creatinine, Ser 1.32; Potassium 4.4; Sodium 138    Lipid Panel    Component Value Date/Time   CHOL 148 04/22/2020 1052   TRIG 119 04/22/2020 1052   HDL 75 04/22/2020 1052   CHOLHDL 2.0 04/22/2020 1052   CHOLHDL 2.3 10/15/2016 1215   VLDL 34 (H) 10/15/2016 1215   LDLCALC 52 04/22/2020 1052      Wt Readings from Last 3 Encounters:  04/23/20 152 lb 8 oz (69.2 kg)  10/25/19 153 lb (69.4 kg)  10/17/19 153 lb 12.8 oz (69.8 kg)      Other studies Reviewed: Additional studies/ records that were reviewed today include: . Review of the above records demonstrates:    ASSESSMENT AND PLAN:  1.  Atrial fib:   Todd Armstrong has new onset Afib.   Seems to be well tolerated at this point.  He does have slightly worse shortness of breath with exertion. Heart rate is a little faster than we would like.  We will increase the carvedilol to 12.5 mg twice a day. We will check a TSH. Start Eliquis 5 mg PO BID  We will get an echocardiogram.  I will set him up to see an APP in 4 weeks.  If he remains in atrial fibrillation, will refer him for cardioversion.  I will plan on seeing him again in 3 months for follow-up visit.   2.  Carotid artery stenosis: stable    2. HTN -    BP is well controlled.   3. Diabetes Mellitus  4.  Hyperlipidemia -   5. CAD, s/p LAD stent 2009 -    No angina    Current medicines are reviewed at length with the patient today.  The patient does not have concerns regarding medicines.  The following changes have been made:  no change  Labs/ tests ordered today include:   No orders of the defined types were placed in this encounter.  Disposition:       Todd Moores, MD  04/23/2020 4:06 PM    [facility] [address], [place], [place]  [address] Phone: [phone]; Fax: [phone]

## 2020-04-23 NOTE — Patient Instructions (Signed)
Medication Instructions:  Your physician has recommended you make the following change in your medication:  START Eliquis 5 mg twice daily INCREASE Carvedilol to 12.5 mg twice daily  *If you need a refill on your cardiac medications before your next appointment, please call your pharmacy*   Lab Work: TODAY - TSH If you have labs (blood work) drawn today and your tests are completely normal, you will receive your results only by: [name] MyChart Message (if you have MyChart) OR . A paper copy in the mail If you have any lab test that is abnormal or we need to change your treatment, we will call you to review the results.   Testing/Procedures: Your physician has requested that you have an echocardiogram. Echocardiography is a painless test that uses sound waves to create images of your heart. It provides your doctor with information about the size and shape of your heart and how well your heart's chambers and valves are working. This procedure takes approximately one hour. There are no restrictions for this procedure.    Follow-Up: At [facility], you and your health needs are our priority.  As part of our continuing mission to provide you with exceptional heart care, we have created designated Provider Care Teams.  These Care Teams include your primary Cardiologist (physician) and Advanced Practice Providers (APPs -  Physician Assistants and Nurse Practitioners) who all work together to provide you with the care you need, when you need it.  We recommend signing up for the patient portal called "MyChart".  Sign up information is provided on this After Visit Summary.  MyChart is used to connect with patients for Virtual Visits (Telemedicine).  Patients are able to view lab/test results, encounter notes, upcoming appointments, etc.  Non-urgent messages can be sent to your provider as well.   To learn more about what you can do with MyChart, go to [email or web address].    Your next appointment:    4 week(s)  The format for your next appointment:   In Person  Provider:   Richardson Dopp, PA-C or Robbie Lis, PA-C or APP not on team

## 2020-04-24 LAB — TSH: TSH: 0.904 u[IU]/mL (ref 0.450–4.500)

## 2020-05-13 ENCOUNTER — Ambulatory Visit (HOSPITAL_COMMUNITY): Payer: Medicare Other | Attending: Cardiology

## 2020-05-13 ENCOUNTER — Other Ambulatory Visit: Payer: Self-pay | Admitting: Nurse Practitioner

## 2020-05-13 ENCOUNTER — Other Ambulatory Visit: Payer: Self-pay

## 2020-05-13 DIAGNOSIS — I2510 Atherosclerotic heart disease of native coronary artery without angina pectoris: Secondary | ICD-10-CM

## 2020-05-13 DIAGNOSIS — I10 Essential (primary) hypertension: Secondary | ICD-10-CM | POA: Diagnosis present

## 2020-05-13 DIAGNOSIS — I4891 Unspecified atrial fibrillation: Secondary | ICD-10-CM | POA: Diagnosis not present

## 2020-05-13 MED ORDER — CARVEDILOL 12.5 MG PO TABS: 12.5000 mg | ORAL_TABLET | Freq: Two times a day (BID) | ORAL | 3 refills | Status: DC

## 2020-05-13 MED ORDER — APIXABAN 5 MG PO TABS: 5.0000 mg | ORAL_TABLET | Freq: Two times a day (BID) | ORAL | 11 refills | Status: DC

## 2020-05-21 ENCOUNTER — Ambulatory Visit: Payer: Medicare Other | Admitting: Cardiovascular Disease

## 2020-05-21 ENCOUNTER — Other Ambulatory Visit: Payer: Self-pay

## 2020-05-21 ENCOUNTER — Encounter: Payer: Self-pay | Admitting: Cardiovascular Disease

## 2020-05-21 VITALS — BP 134/62 | HR 68 | Ht 70.0 in | Wt 153.5 lb

## 2020-05-21 DIAGNOSIS — I4891 Unspecified atrial fibrillation: Secondary | ICD-10-CM | POA: Diagnosis not present

## 2020-05-21 DIAGNOSIS — I10 Essential (primary) hypertension: Secondary | ICD-10-CM | POA: Diagnosis not present

## 2020-05-21 DIAGNOSIS — E785 Hyperlipidemia, unspecified: Secondary | ICD-10-CM | POA: Diagnosis not present

## 2020-05-21 DIAGNOSIS — I2510 Atherosclerotic heart disease of native coronary artery without angina pectoris: Secondary | ICD-10-CM | POA: Diagnosis not present

## 2020-05-21 LAB — CBC
Hematocrit: 36.1 % — ABNORMAL LOW (ref 37.5–51.0)
Hemoglobin: 11.8 g/dL — ABNORMAL LOW (ref 13.0–17.7)
MCH: 30.3 pg (ref 26.6–33.0)
MCHC: 32.7 g/dL (ref 31.5–35.7)
MCV: 93 fL (ref 79–97)
Platelets: 180 10*3/uL (ref 150–450)
RBC: 3.89 x10E6/uL — ABNORMAL LOW (ref 4.14–5.80)
RDW: 12.4 % (ref 11.6–15.4)
WBC: 9.1 10*3/uL (ref 3.4–10.8)

## 2020-05-21 LAB — BASIC METABOLIC PANEL
BUN/Creatinine Ratio: 19 (ref 10–24)
BUN: 30 mg/dL — ABNORMAL HIGH (ref 8–27)
CO2: 24 mmol/L (ref 20–29)
Calcium: 9.3 mg/dL (ref 8.6–10.2)
Chloride: 100 mmol/L (ref 96–106)
Creatinine, Ser: 1.59 mg/dL — ABNORMAL HIGH (ref 0.76–1.27)
GFR calc Af Amer: 44 mL/min/{1.73_m2} — ABNORMAL LOW (ref >59)
GFR calc non Af Amer: 38 mL/min/{1.73_m2} — ABNORMAL LOW (ref >59)
Glucose: 140 mg/dL — ABNORMAL HIGH (ref 65–99)
Potassium: 4.9 mmol/L (ref 3.5–5.2)
Sodium: 138 mmol/L (ref 134–144)

## 2020-05-21 NOTE — Patient Instructions (Addendum)
Medication Instructions:  Your physician recommends that you continue on your current medications as directed. Please refer to the Current Medication list given to you today.  *If you need a refill on your cardiac medications before your next appointment, please call your pharmacy*   Lab Work: BMET and CBC today  If you have labs (blood work) drawn today and your tests are completely normal, you will receive your results only by: [name] MyChart Message (if you have MyChart) OR . A paper copy in the mail If you have any lab test that is abnormal or we need to change your treatment, we will call you to review the results.   Testing/Procedures: Your physician has recommended that you have a Cardioversion (DCCV). Electrical Cardioversion uses a jolt of electricity to your heart either through paddles or wired patches attached to your chest. This is a controlled, usually prescheduled, procedure. Defibrillation is done under light anesthesia in the hospital, and you usually go home the day of the procedure. This is done to get your heart back into a normal rhythm. You are not awake for the procedure. Please see the instruction sheet given to you today.    Follow-Up: At [facility], you and your health needs are our priority.  As part of our continuing mission to provide you with exceptional heart care, we have created designated Provider Care Teams.  These Care Teams include your primary Cardiologist (physician) and Advanced Practice Providers (APPs -  Physician Assistants and Nurse Practitioners) who all work together to provide you with the care you need, when you need it.  We recommend signing up for the patient portal called "MyChart".  Sign up information is provided on this After Visit Summary.  MyChart is used to connect with patients for Virtual Visits (Telemedicine).  Patients are able to view lab/test results, encounter notes, upcoming appointments, etc.  Non-urgent messages can be sent to  your provider as well.   To learn more about what you can do with MyChart, go to [email or web address].    Your next appointment:   3 month(s)  The format for your next appointment:   In Person  Provider:   You may see [name], MD or one of the following Advanced Practice Providers on your designated Care Team:    Richardson Dopp, PA-C  Robbie Lis, [place]    Other Instructions    You are scheduled for a Cardioversion on June 05, 2020 with Dr. Acie Fredrickson . Please arrive at the [facility] (Main Entrance A) at [facility]: [place], [place] [address] at 9am.  DIET: Nothing to eat or drink after midnight except a sip of water with medications (see medication instructions below)   Medication Instructions:  Hold Metformin the morning of the procedure.  Continue your anticoagulant:  You will need to continue your anticoagulant after your procedure until you are told by your provider that it is safe to stop    Labs: You will have labs drawn today  You must have a responsible person to drive you home and stay in the waiting area during your procedure. Failure to do so could result in cancellation.   Bring your insurance cards.   *Special Note: Every effort is made to have your procedure done on time. Occasionally there are emergencies that occur at the hospital that may cause delays. Please be patient if a delay does occur.

## 2020-05-21 NOTE — Progress Notes (Signed)
Cardiology Office Note   Date:  05/21/2020   ID:  Todd Armstrong, DOB Aug 06, 1931, MRN [id number]  PCP:  Lavone Orn, MD  Cardiologist:   [name], MD   Chief Complaint  Patient presents with  . Coronary Artery Disease  . Hyperlipidemia   1. CAD, s/p LAD stent 2009 2. HTN 3. Diabetes Mellitus 4. Hyperlipidemia 5. Carotid artery stenosis  - Right - mod- severe    Todd Armstrong is doing well from a cardiac standpoint. He has not had any episodes of chest pain or shortness breath. [name] been exercising on a regular basis. He had a nice garden this year.   Apr 26, 2013:  Todd Armstrong is doing well. No CP , no dyspnea. He has put in his garden this year.   Nov. 12, 2014:  Apr 24, 2014:  Todd Armstrong is doing well. No CP , no dyspnea.  hes getting his garden going.   Nov. 9, 2015:  Todd Armstrong is doing well. His BP was elevated at his last visit. We increased his Lisinopril/HCTZ and his Bp is much better.    May 15, 2015:   Todd Armstrong is a 86 y.o. male who presents for his CAD Doing well.   No CP or dyspnea.  Still caring for his wife who is ill.  Still very busy working out in the garden   Nov. 29, 2016:  Doing well.  No CP or dyspnea.  Still very busy taking care of his wife, Todd Armstrong  She is having lots of bleeding from her bowels.   May 13, 2016:  Todd Armstrong is doing well.  Still taking care of Todd Armstrong  - she has more bad days than good days .  Enjoys his garden  BP and HR are very good No CP or dyspnea  Nov. 3, 2017:  Doing well.   Worked in his garden this past summer .  BP is well controlled.  HR is slow,  But no syncopal episodes  Having more challenges with wife , Todd Armstrong now.   She seems to have more dementia .  July 18. 2018  No issues, no CP .  BP and HR are well controlled.   January 18, 2018:  Doing well.   Stays busy.    He has moderate to severe right carotid artery stenosis.  Recommendation is for a follow-up in 12 months which  will be November, 2019.  Sept. 4, 2019:  Tough time dealing with wife Todd Armstrong,   S/p recent right carotid endarectomy Todd Salon, MD )  No CP Has occasional dizziness   February 19, 2019:  Doing well .    No CP , no dyspnea.  Still worried about wife Todd Armstrong ,  She has a UTI  BP is a bit elevated.  Avoids salty foods  Wt was 154 lbs in Sept, 2019,   [place] today is 148.8 lbs.   October 17, 2019: Todd Armstrong is seen today for a follow-up visit.  He has a history of coronary artery disease and is status post PTCA and stenting.  He also has carotid artery disease, hypertension, dyslipidemia and diabetes mellitus. Has rare episodes of orthostasis  These episodes are not so severe that he wants to consider decreasing the dose of his lisinopril HCTZ.[name]  He denies any chest pain or shortness of breath. Wife passed away since I last saw him   Apr 23, 2020:  Todd Armstrong is in atrial fib today .  He feels fine.  Cannot tell that  his HR is irreg.  No cp ,  Slight DOE with exertion compared to last year he has been able to do all of his normal activities.  He has noticed a little bit more shortness of breath when walking out the yard.   June 9 , 2021:  Todd Armstrong is seen today for follow-up of his atrial fibrillation.  He was found to have atrial fibrillation about a month ago. Has stopped driving .  Slow reflexes.  He is still in AF We discussed cardioversion  Is a little behind" eat anything except for the lettuce and spinach imaging to get our you take care of welcome  Past Medical History:  Diagnosis Date  . Acute myocardial infarction   . Carotid stenosis   . Coronary artery disease    PTCA and stenting of his right coronary artery. 3.5 x 16-mm Liberte stent.              . Diabetes mellitus without complication ([place])   . Dyslipidemia   . Hyperlipemia   . Hypertension   . Partial tear of rotator cuff    right shoulder  . Peripheral vascular disease ([place])   . Pulmonary hypertension (HCC)     moderate pulmonary HTN by 2007 echo (RVSP 52 mmHg)  . Stroke ([place])   . Wears dentures     Past Surgical History:  Procedure Laterality Date  . CARDIAC CATHETERIZATION  11/05/2008  . CARDIAC CATHETERIZATION  10/22/1993   EF 60%  . CAROTID ENDARTERECTOMY Right 03/23/2018  . CORONARY ANGIOPLASTY WITH STENT PLACEMENT    . ENDARTERECTOMY Right 03/23/2018   Procedure: RIGHT CAROTID ARTERY ENDARTERECTOMY;  Surgeon: Waynetta Sandy, MD;  Location: [place];  Service: Vascular;  Laterality: Right;  . FRACTURE SURGERY     righ fibula and tibia  . HERNIA REPAIR  1984  . KNEE ARTHROSCOPY Left   . MULTIPLE TOOTH EXTRACTIONS    . PATCH ANGIOPLASTY Right 03/23/2018   Procedure: WITH XENOSURE 1CM X 6CM PATCH ANGIOPLASTY;  Surgeon: Waynetta Sandy, MD;  Location: [facility];  Service: Vascular;  Laterality: Right;  . SHOULDER ARTHROSCOPY Right 06/09/2017   Procedure: ARTHROSCOPY SHOULDER SUBACROMIAL DECOMPRESSION AND ACROMIOPLASTY;  Surgeon: Melrose Nakayama, MD;  Location: [place];  Service: Orthopedics;  Laterality: Right;  GENERAL ANESTHESIA WITH BLOCK     Current Outpatient Medications  Medication Sig Dispense Refill  . apixaban (ELIQUIS) 5 MG TABS tablet Take 1 tablet (5 mg total) by mouth 2 (two) times daily. 60 tablet 11  . carvedilol (COREG) 12.5 MG tablet Take 1 tablet (12.5 mg total) by mouth 2 (two) times daily. 180 tablet 3  . clopidogrel (PLAVIX) 75 MG tablet TAKE 1 TABLET BY MOUTH  DAILY 90 tablet 3  . lisinopril-hydrochlorothiazide (ZESTORETIC) 20-25 MG tablet TAKE 1 TABLET BY MOUTH  DAILY 90 tablet 2  . metFORMIN (GLUCOPHAGE) 1000 MG tablet Take 1,000 mg by mouth 2 (two) times daily with a meal.      . nitroGLYCERIN (NITROSTAT) 0.4 MG SL tablet Place 1 tablet (0.4 mg total) under the tongue every 5 (five) minutes as needed for chest pain. 25 tablet 6  . rosuvastatin (CRESTOR) 10 MG tablet TAKE 1 TABLET BY MOUTH  DAILY 90 tablet 2  . tamsulosin (FLOMAX) 0.4 MG CAPS capsule Take  0.4 mg by mouth daily.     No current facility-administered medications for this visit.    Allergies:   No known allergies    Social History:  The patient  reports that he has quit smoking. His smoking use included cigarettes. He has never used smokeless tobacco. He reports that he does not drink alcohol or use drugs.   Family History:  The patient's family history includes Heart attack in his father.    ROS: As per current history.  All other systems are negative.  Physical Exam: Blood pressure 134/62, pulse 68, height 5\' 10"  (1.778 m), weight 153 lb 8 oz (69.6 kg), SpO2 100 %.  GEN:  Well nourished, well developed in no acute distress HEENT: Normal NECK: No JVD; No carotid bruits LYMPHATICS: No lymphadenopathy CARDIAC: RRR , no murmurs, rubs, gallops RESPIRATORY:  Clear to auscultation without rales, wheezing or rhonchi  ABDOMEN: Soft, non-tender, non-distended MUSCULOSKELETAL:  No edema; No deformity  SKIN: Warm and dry NEUROLOGIC:  Alert and oriented x 3   EKG:         Recent Labs: 04/22/2020: ALT 15; BUN 32; Creatinine, Ser 1.32; Potassium 4.4; Sodium 138 04/23/2020: TSH 0.904    Lipid Panel    Component Value Date/Time   CHOL 148 04/22/2020 1052   TRIG 119 04/22/2020 1052   HDL 75 04/22/2020 1052   CHOLHDL 2.0 04/22/2020 1052   CHOLHDL 2.3 10/15/2016 1215   VLDL 34 (H) 10/15/2016 1215   LDLCALC 52 04/22/2020 1052      Wt Readings from Last 3 Encounters:  05/21/20 153 lb 8 oz (69.6 kg)  04/23/20 152 lb 8 oz (69.2 kg)  10/25/19 153 lb (69.4 kg)      Other studies Reviewed: Additional studies/ records that were reviewed today include: . Review of the above records demonstrates:    ASSESSMENT AND PLAN:  1.  Atrial fib:   He continues with atrial fibrillation.  We will set up a cardioversion in the next several weeks. June 05, 2020  2.  Carotid artery stenosis:     2. HTN -     blood pressure is well controlled.  Continue current  medications.  3. Diabetes Mellitus  4. Hyperlipidemia -continue current medications.  5. CAD, s/p LAD stent 2009 -     he is not having any episodes of angina.  Continue current medications.   Current medicines are reviewed at length with the patient today.  The patient does not have concerns regarding medicines.  The following changes have been made:  no change  Labs/ tests ordered today include:   No orders of the defined types were placed in this encounter.  Disposition:       [name], MD  05/21/2020 10:47 AM    [facility] [place], [place], [place]  [address] Phone: [phone]; Fax: [phone]

## 2020-05-21 NOTE — H&P (View-Only) (Signed)
Cardiology Office Note   Date:  05/21/2020   ID:  Todd Armstrong, DOB [birth year], MRN [id number]  PCP:  Lavone Orn, MD  Cardiologist:   [name], MD   Chief Complaint  Patient presents with  . Coronary Artery Disease  . Hyperlipidemia   1. CAD, s/p LAD stent 2009 2. HTN 3. Diabetes Mellitus 4. Hyperlipidemia 5. Carotid artery stenosis  - Right - mod- severe    Todd Armstrong is doing well from a cardiac standpoint. He has not had any episodes of chest pain or shortness breath. [name] been exercising on a regular basis. He had a nice garden this year.   Apr 26, 2013:  Todd Armstrong is doing well. No CP , no dyspnea. He has put in his garden this year.   Nov. 12, 2014:  Apr 24, 2014:  Todd Armstrong is doing well. No CP , no dyspnea.  hes getting his garden going.   Nov. 9, 2015:  Todd Armstrong is doing well. His BP was elevated at his last visit. We increased his Lisinopril/HCTZ and his Bp is much better.    May 15, 2015:   Todd Armstrong is a 86 y.o. male who presents for his CAD Doing well.   No CP or dyspnea.  Still caring for his wife who is ill.  Still very busy working out in the garden   Nov. 29, 2016:  Doing well.  No CP or dyspnea.  Still very busy taking care of his wife, Todd Armstrong  She is having lots of bleeding from her bowels.   May 13, 2016:  Todd Armstrong is doing well.  Still taking care of Todd Armstrong  - she has more bad days than good days .  Enjoys his garden  BP and HR are very good No CP or dyspnea  Nov. 3, 2017:  Doing well.   Worked in his garden this past summer .  BP is well controlled.  HR is slow,  But no syncopal episodes  Having more challenges with wife , Todd Armstrong now.   She seems to have more dementia .  July 18. 2018  No issues, no CP .  BP and HR are well controlled.   January 18, 2018:  Doing well.   Stays busy.    He has moderate to severe right carotid artery stenosis.  Recommendation is for a follow-up in 12 months which  will be November, 2019.  Sept. 4, 2019:  Tough time dealing with wife Todd Armstrong,   S/p recent right carotid endarectomy Todd Salon, MD )  No CP Has occasional dizziness   February 19, 2019:  Doing well .    No CP , no dyspnea.  Still worried about wife Todd Armstrong ,  She has a UTI  BP is a bit elevated.  Avoids salty foods  Wt was 154 lbs in Sept, 2019,   [place] today is 148.8 lbs.   October 17, 2019: Todd Armstrong is seen today for a follow-up visit.  He has a history of coronary artery disease and is status post PTCA and stenting.  He also has carotid artery disease, hypertension, dyslipidemia and diabetes mellitus. Has rare episodes of orthostasis  These episodes are not so severe that he wants to consider decreasing the dose of his lisinopril HCTZ.[name]  He denies any chest pain or shortness of breath. Wife passed away since I last saw him   Apr 23, 2020:  Todd Armstrong is in atrial fib today .  He feels fine.  Cannot tell that  his HR is irreg.  No cp ,  Slight DOE with exertion compared to last year he has been able to do all of his normal activities.  He has noticed a little bit more shortness of breath when walking out the yard.   June 9 , 2021:  Todd Armstrong is seen today for follow-up of his atrial fibrillation.  He was found to have atrial fibrillation about a month ago. Has stopped driving .  Slow reflexes.  He is still in AF We discussed cardioversion  Is a little behind" eat anything except for the lettuce and spinach imaging to get our you take care of welcome  Past Medical History:  Diagnosis Date  . Acute myocardial infarction   . Carotid stenosis   . Coronary artery disease    PTCA and stenting of his right coronary artery. 3.5 x 16-mm Liberte stent.              . Diabetes mellitus without complication ([place])   . Dyslipidemia   . Hyperlipemia   . Hypertension   . Partial tear of rotator cuff    right shoulder  . Peripheral vascular disease ([place])   . Pulmonary hypertension (HCC)     moderate pulmonary HTN by 2007 echo (RVSP 52 mmHg)  . Stroke ([place])   . Wears dentures     Past Surgical History:  Procedure Laterality Date  . CARDIAC CATHETERIZATION  11/05/2008  . CARDIAC CATHETERIZATION  10/22/1993   EF 60%  . CAROTID ENDARTERECTOMY Right 03/23/2018  . CORONARY ANGIOPLASTY WITH STENT PLACEMENT    . ENDARTERECTOMY Right 03/23/2018   Procedure: RIGHT CAROTID ARTERY ENDARTERECTOMY;  Surgeon: Waynetta Sandy, MD;  Location: [facility];  Service: Vascular;  Laterality: Right;  . FRACTURE SURGERY     righ fibula and tibia  . HERNIA REPAIR  1984  . KNEE ARTHROSCOPY Left   . MULTIPLE TOOTH EXTRACTIONS    . PATCH ANGIOPLASTY Right 03/23/2018   Procedure: WITH XENOSURE 1CM X 6CM PATCH ANGIOPLASTY;  Surgeon: Waynetta Sandy, MD;  Location: [facility];  Service: Vascular;  Laterality: Right;  . SHOULDER ARTHROSCOPY Right 06/09/2017   Procedure: ARTHROSCOPY SHOULDER SUBACROMIAL DECOMPRESSION AND ACROMIOPLASTY;  Surgeon: Melrose Nakayama, MD;  Location: [facility];  Service: Orthopedics;  Laterality: Right;  GENERAL ANESTHESIA WITH BLOCK     Current Outpatient Medications  Medication Sig Dispense Refill  . apixaban (ELIQUIS) 5 MG TABS tablet Take 1 tablet (5 mg total) by mouth 2 (two) times daily. 60 tablet 11  . carvedilol (COREG) 12.5 MG tablet Take 1 tablet (12.5 mg total) by mouth 2 (two) times daily. 180 tablet 3  . clopidogrel (PLAVIX) 75 MG tablet TAKE 1 TABLET BY MOUTH  DAILY 90 tablet 3  . lisinopril-hydrochlorothiazide (ZESTORETIC) 20-25 MG tablet TAKE 1 TABLET BY MOUTH  DAILY 90 tablet 2  . metFORMIN (GLUCOPHAGE) 1000 MG tablet Take 1,000 mg by mouth 2 (two) times daily with a meal.      . nitroGLYCERIN (NITROSTAT) 0.4 MG SL tablet Place 1 tablet (0.4 mg total) under the tongue every 5 (five) minutes as needed for chest pain. 25 tablet 6  . rosuvastatin (CRESTOR) 10 MG tablet TAKE 1 TABLET BY MOUTH  DAILY 90 tablet 2  . tamsulosin (FLOMAX) 0.4 MG CAPS capsule Take  0.4 mg by mouth daily.     No current facility-administered medications for this visit.    Allergies:   No known allergies    Social History:  The patient  reports that he has quit smoking. His smoking use included cigarettes. He has never used smokeless tobacco. He reports that he does not drink alcohol or use drugs.   Family History:  The patient's family history includes Heart attack in his father.    ROS: As per current history.  All other systems are negative.  Physical Exam: Blood pressure 134/62, pulse 68, height 5\' 10"  (1.778 m), weight 153 lb 8 oz (69.6 kg), SpO2 100 %.  GEN:  Well nourished, well developed in no acute distress HEENT: Normal NECK: No JVD; No carotid bruits LYMPHATICS: No lymphadenopathy CARDIAC: RRR , no murmurs, rubs, gallops RESPIRATORY:  Clear to auscultation without rales, wheezing or rhonchi  ABDOMEN: Soft, non-tender, non-distended MUSCULOSKELETAL:  No edema; No deformity  SKIN: Warm and dry NEUROLOGIC:  Alert and oriented x 3   EKG:         Recent Labs: 04/22/2020: ALT 15; BUN 32; Creatinine, Ser 1.32; Potassium 4.4; Sodium 138 04/23/2020: TSH 0.904    Lipid Panel    Component Value Date/Time   CHOL 148 04/22/2020 1052   TRIG 119 04/22/2020 1052   HDL 75 04/22/2020 1052   CHOLHDL 2.0 04/22/2020 1052   CHOLHDL 2.3 10/15/2016 1215   VLDL 34 (H) 10/15/2016 1215   LDLCALC 52 04/22/2020 1052      Wt Readings from Last 3 Encounters:  05/21/20 153 lb 8 oz (69.6 kg)  04/23/20 152 lb 8 oz (69.2 kg)  10/25/19 153 lb (69.4 kg)      Other studies Reviewed: Additional studies/ records that were reviewed today include: . Review of the above records demonstrates:    ASSESSMENT AND PLAN:  1.  Atrial fib:   He continues with atrial fibrillation.  We will set up a cardioversion in the next several weeks. June 05, 2020  2.  Carotid artery stenosis:     2. HTN -     blood pressure is well controlled.  Continue current  medications.  3. Diabetes Mellitus  4. Hyperlipidemia -continue current medications.  5. CAD, s/p LAD stent 2009 -     he is not having any episodes of angina.  Continue current medications.   Current medicines are reviewed at length with the patient today.  The patient does not have concerns regarding medicines.  The following changes have been made:  no change  Labs/ tests ordered today include:   No orders of the defined types were placed in this encounter.  Disposition:       [name], MD  05/21/2020 10:47 AM    [facility] [place], [place], [place]  [address] Phone: [phone]; Fax: [phone]

## 2020-06-03 ENCOUNTER — Other Ambulatory Visit (HOSPITAL_COMMUNITY)
Admission: RE | Admit: 2020-06-03 | Discharge: 2020-06-03 | Disposition: A | Discharge status: Home / Self Care | Payer: Medicare Other | Source: Ambulatory Visit | Attending: Cardiovascular Disease | Admitting: Cardiovascular Disease

## 2020-06-03 DIAGNOSIS — Z20822 Contact with and (suspected) exposure to covid-19: Secondary | ICD-10-CM | POA: Insufficient documentation

## 2020-06-03 DIAGNOSIS — Z01812 Encounter for preprocedural laboratory examination: Secondary | ICD-10-CM | POA: Insufficient documentation

## 2020-06-03 LAB — SARS CORONAVIRUS 2 (TAT 6-24 HRS): SARS Coronavirus 2: NEGATIVE

## 2020-06-04 ENCOUNTER — Other Ambulatory Visit: Payer: Self-pay | Admitting: Cardiovascular Disease

## 2020-06-05 ENCOUNTER — Ambulatory Visit (HOSPITAL_COMMUNITY): Payer: Medicare Other | Admitting: Certified Registered Nurse Anesthetist

## 2020-06-05 ENCOUNTER — Ambulatory Visit (HOSPITAL_COMMUNITY)
Admission: RE | Admit: 2020-06-05 | Discharge: 2020-06-05 | Disposition: A | Discharge status: Home / Self Care | Payer: Medicare Other | Attending: Cardiovascular Disease | Admitting: Cardiovascular Disease

## 2020-06-05 ENCOUNTER — Other Ambulatory Visit: Payer: Self-pay

## 2020-06-05 ENCOUNTER — Encounter (HOSPITAL_COMMUNITY)
Admission: RE | Disposition: A | Discharge status: Home / Self Care | Payer: Self-pay | Source: Home / Self Care | Attending: Cardiovascular Disease

## 2020-06-05 ENCOUNTER — Encounter (HOSPITAL_COMMUNITY): Payer: Self-pay | Admitting: Cardiovascular Disease

## 2020-06-05 DIAGNOSIS — I6521 Occlusion and stenosis of right carotid artery: Secondary | ICD-10-CM | POA: Insufficient documentation

## 2020-06-05 DIAGNOSIS — Z8673 Personal history of transient ischemic attack (TIA), and cerebral infarction without residual deficits: Secondary | ICD-10-CM | POA: Insufficient documentation

## 2020-06-05 DIAGNOSIS — I4819 Other persistent atrial fibrillation: Secondary | ICD-10-CM

## 2020-06-05 DIAGNOSIS — Z79899 Other long term (current) drug therapy: Secondary | ICD-10-CM | POA: Insufficient documentation

## 2020-06-05 DIAGNOSIS — E1151 Type 2 diabetes mellitus with diabetic peripheral angiopathy without gangrene: Secondary | ICD-10-CM | POA: Diagnosis not present

## 2020-06-05 DIAGNOSIS — I4891 Unspecified atrial fibrillation: Secondary | ICD-10-CM | POA: Insufficient documentation

## 2020-06-05 DIAGNOSIS — Z7984 Long term (current) use of oral hypoglycemic drugs: Secondary | ICD-10-CM | POA: Diagnosis not present

## 2020-06-05 DIAGNOSIS — Z955 Presence of coronary angioplasty implant and graft: Secondary | ICD-10-CM | POA: Insufficient documentation

## 2020-06-05 DIAGNOSIS — Z7901 Long term (current) use of anticoagulants: Secondary | ICD-10-CM | POA: Insufficient documentation

## 2020-06-05 DIAGNOSIS — I10 Essential (primary) hypertension: Secondary | ICD-10-CM | POA: Diagnosis not present

## 2020-06-05 DIAGNOSIS — E785 Hyperlipidemia, unspecified: Secondary | ICD-10-CM | POA: Insufficient documentation

## 2020-06-05 DIAGNOSIS — Z7902 Long term (current) use of antithrombotics/antiplatelets: Secondary | ICD-10-CM | POA: Diagnosis not present

## 2020-06-05 DIAGNOSIS — I272 Pulmonary hypertension, unspecified: Secondary | ICD-10-CM | POA: Diagnosis not present

## 2020-06-05 DIAGNOSIS — I252 Old myocardial infarction: Secondary | ICD-10-CM | POA: Insufficient documentation

## 2020-06-05 DIAGNOSIS — I2510 Atherosclerotic heart disease of native coronary artery without angina pectoris: Secondary | ICD-10-CM | POA: Diagnosis not present

## 2020-06-05 HISTORY — PX: CARDIOVERSION: SHX1299

## 2020-06-05 LAB — GLUCOSE, CAPILLARY
Glucose-Capillary: 152 mg/dL — ABNORMAL HIGH (ref 70–99)
Glucose-Capillary: 152 mg/dL — ABNORMAL HIGH (ref 70–99)

## 2020-06-05 SURGERY — CARDIOVERSION
Anesthesia: General

## 2020-06-05 MED ORDER — LIDOCAINE 2% (20 MG/ML) 5 ML SYRINGE
INTRAMUSCULAR | Status: DC | PRN
  Administered 2020-06-05: 50 mg via INTRAVENOUS

## 2020-06-05 MED ORDER — SODIUM CHLORIDE 0.9 % IV SOLN: INTRAVENOUS | Status: DC | PRN

## 2020-06-05 MED ORDER — PROPOFOL 10 MG/ML IV BOLUS
INTRAVENOUS | Status: DC | PRN
  Administered 2020-06-05: 50 mg via INTRAVENOUS

## 2020-06-05 NOTE — Anesthesia Postprocedure Evaluation (Signed)
Anesthesia Post Note  Patient: Todd Armstrong  Procedure(s) Performed: CARDIOVERSION (N/A )     Patient location during evaluation: PACU Anesthesia Type: General Level of consciousness: awake and alert and oriented Pain management: pain level controlled Vital Signs Assessment: post-procedure vital signs reviewed and stable Respiratory status: spontaneous breathing, nonlabored ventilation and respiratory function stable Cardiovascular status: blood pressure returned to baseline and stable Postop Assessment: no apparent nausea or vomiting Anesthetic complications: no   No complications documented.  Last Vitals:  Vitals:   06/05/20 1009 06/05/20 1019  BP: (!) 97/57 (!) 118/51  Pulse: (!) 58 60  Resp: 14 18  Temp:    SpO2: 100% 100%    Last Pain:  Vitals:   06/05/20 1019  TempSrc:   PainSc: 0-No pain                 Tawanda Schall A.

## 2020-06-05 NOTE — Interval H&P Note (Signed)
History and Physical Interval Note:  06/05/2020 9:45 AM  Todd Armstrong  has presented today for surgery, with the diagnosis of A-FIB.  The various methods of treatment have been discussed with the patient and family. After consideration of risks, benefits and other options for treatment, the patient has consented to  Procedure(s): CARDIOVERSION (N/A) as a surgical intervention.  The patient's history has been reviewed, patient examined, no change in status, stable for surgery.  I have reviewed the patient's chart and labs.  Questions were answered to the patient's satisfaction.     [name]

## 2020-06-05 NOTE — CV Procedure (Signed)
    Cardioversion Note  Todd Armstrong [id number] 31-Jul-1931  Procedure: DC Cardioversion Indications: atrial fib   Procedure Details Consent: Obtained Time Out: Verified patient identification, verified procedure, site/side was marked, verified correct patient position, special equipment/implants available, Radiology Safety Procedures followed,  medications/allergies/relevent history reviewed, required imaging and test results available.  Performed  The patient has been on adequate anticoagulation.  The patient received IV  Lidocaine 50 mg followed by Propofol 50 mg IV  for sedation.  Synchronous cardioversion was performed at 200  Joules with anterior manual pressure on the defib electrode.  The cardioversion was successful     Complications: No apparent complications Patient did tolerate procedure well.   Thayer Headings, Brooke Bonito., MD, [facility] 06/05/2020, 10:00 AM

## 2020-06-05 NOTE — Discharge Instructions (Signed)
Electrical Cardioversion Electrical cardioversion is the delivery of a jolt of electricity to restore a normal rhythm to the heart. A rhythm that is too fast or is not regular keeps the heart from pumping well. In this procedure, sticky patches or metal paddles are placed on the chest to deliver electricity to the heart from a device. This procedure may be done in an emergency if:  There is low or no blood pressure as a result of the heart rhythm.  Normal rhythm must be restored as fast as possible to protect the brain and heart from further damage.  It may save a life. This may also be a scheduled procedure for irregular or fast heart rhythms that are not immediately life-threatening. Tell a health care provider about:  Any allergies you have.  All medicines you are taking, including vitamins, herbs, eye drops, creams, and over-the-counter medicines.  Any problems you or family members have had with anesthetic medicines.  Any blood disorders you have.  Any surgeries you have had.  Any medical conditions you have.  Whether you are pregnant or may be pregnant. What are the risks? Generally, this is a safe procedure. However, problems may occur, including:  Allergic reactions to medicines.  A blood clot that breaks free and travels to other parts of your body.  The possible return of an abnormal heart rhythm within hours or days after the procedure.  Your heart stopping (cardiac arrest). This is rare. What happens before the procedure? Medicines  Your health care provider may have you start taking: ? Blood-thinning medicines (anticoagulants) so your blood does not clot as easily. ? Medicines to help stabilize your heart rate and rhythm.  Ask your health care provider about: ? Changing or stopping your regular medicines. This is especially important if you are taking diabetes medicines or blood thinners. ? Taking medicines such as aspirin and ibuprofen. These medicines can  thin your blood. Do not take these medicines unless your health care provider tells you to take them. ? Taking over-the-counter medicines, vitamins, herbs, and supplements. General instructions  Follow instructions from your health care provider about eating or drinking restrictions.  Plan to have someone take you home from the hospital or clinic.  If you will be going home right after the procedure, plan to have someone with you for 24 hours.  Ask your health care provider what steps will be taken to help prevent infection. These may include washing your skin with a germ-killing soap. What happens during the procedure?   An IV will be inserted into one of your veins.  Sticky patches (electrodes) or metal paddles may be placed on your chest.  You will be given a medicine to help you relax (sedative).  An electrical shock will be delivered. The procedure may vary among health care providers and hospitals. What can I expect after the procedure?  Your blood pressure, heart rate, breathing rate, and blood oxygen level will be monitored until you leave the hospital or clinic.  Your heart rhythm will be watched to make sure it does not change.  You may have some redness on the skin where the shocks were given. Follow these instructions at home:  Do not drive for 24 hours if you were given a sedative during your procedure.  Take over-the-counter and prescription medicines only as told by your health care provider.  Ask your health care provider how to check your pulse. Check it often.  Rest for 48 hours after the procedure or   as told by your health care provider.  Avoid or limit your caffeine use as told by your health care provider.  Keep all follow-up visits as told by your health care provider. This is important. Contact a health care provider if:  You feel like your heart is beating too quickly or your pulse is not regular.  You have a serious muscle cramp that does not go  away. Get help right away if:  You have discomfort in your chest.  You are dizzy or you feel faint.  You have trouble breathing or you are short of breath.  Your speech is slurred.  You have trouble moving an arm or leg on one side of your body.  Your fingers or toes turn cold or blue. Summary  Electrical cardioversion is the delivery of a jolt of electricity to restore a normal rhythm to the heart.  This procedure may be done right away in an emergency or may be a scheduled procedure if the condition is not an emergency.  Generally, this is a safe procedure.  After the procedure, check your pulse often as told by your health care provider. This information is not intended to replace advice given to you by your health care provider. Make sure you discuss any questions you have with your health care provider. Document Revised: 07/02/2019 Document Reviewed: 07/02/2019 Elsevier Patient Education  2020 Elsevier Inc.  

## 2020-06-05 NOTE — Transfer of Care (Signed)
Immediate Anesthesia Transfer of Care Note  Patient: Todd Armstrong  Procedure(s) Performed: CARDIOVERSION (N/A )  Patient Location: Endoscopy Unit  Anesthesia Type:General  Level of Consciousness: awake and drowsy  Airway & Oxygen Therapy: Patient Spontanous Breathing  Post-op Assessment: Report given to RN, Post -op Vital signs reviewed and stable and Patient moving all extremities X 4  Post vital signs: Reviewed and stable  Last Vitals:  Vitals Value Taken Time  BP    Temp    Pulse    Resp    SpO2      Last Pain:  Vitals:   06/05/20 0923  TempSrc: Oral  PainSc: 0-No pain         Complications: No complications documented.

## 2020-06-05 NOTE — Anesthesia Preprocedure Evaluation (Addendum)
Anesthesia Evaluation   Patient awake    Reviewed: Allergy & Precautions, NPO status , Patient's Chart, lab work & pertinent test results, reviewed documented beta blocker date and time   Airway Mallampati: II  TM Distance: >3 FB Neck ROM: Limited    Dental  (+) Partial Lower, Partial Upper   Pulmonary former smoker,    Pulmonary exam normal breath sounds clear to auscultation       Cardiovascular hypertension, Pt. on medications and Pt. on home beta blockers + CAD, + Past MI, + Cardiac Stents and + Peripheral Vascular Disease  Normal cardiovascular exam Rhythm:Regular Rate:Normal  Right carotid stenosis S/P endarterectomy with patch graft Acute MI Stent RCA Mild pulmonary HTN  Echo 05/13/20 1. Left ventricular ejection fraction, by estimation, is 60 to 65%. The left ventricle has normal function. The left ventricle has no regional wall motion abnormalities. Left ventricular diastolic parameters are indeterminate.  2. Right ventricular systolic function is normal. The right ventricular size is normal. There is moderately elevated pulmonary artery systolic pressure.  3. Left atrial size was mildly dilated.  4. The mitral valve is normal in structure. Moderate mitral valve regurgitation. No evidence of mitral stenosis.  5. The aortic valve is tricuspid. Aortic valve regurgitation is mild. Mild to moderate aortic valve sclerosis/calcification is present, without any evidence of aortic stenosis.  6. The inferior vena cava is normal in size with greater than 50% respiratory variability, suggesting right atrial pressure of 3 mmHg.    Neuro/Psych CVA, No Residual Symptoms negative psych ROS   GI/Hepatic negative GI ROS, Neg liver ROS,   Endo/Other  diabetes, Well Controlled, Type 2, Oral Hypoglycemic AgentsHyperlipidemia  Renal/GU negative Renal ROS   BPH    Musculoskeletal  (+) Arthritis ,   Abdominal   Peds   Hematology Eliquis and Plavix therapy- last dose this am   Anesthesia Other Findings   Reproductive/Obstetrics                            Anesthesia Physical Anesthesia Plan  ASA: III  Anesthesia Plan: General   Post-op Pain Management:    Induction: Intravenous  PONV Risk Score and Plan: 2 and Treatment may vary due to age or medical condition  Airway Management Planned: Mask and Natural Airway  Additional Equipment:   Intra-op Plan:   Post-operative Plan:   Informed Consent:     Dental advisory given  Plan Discussed with: CRNA  Anesthesia Plan Comments:         Anesthesia Quick Evaluation

## 2020-06-05 NOTE — Anesthesia Procedure Notes (Signed)
Procedure Name: General with mask airway Date/Time: 06/05/2020 9:50 AM Performed by: Harden Mo, CRNA Pre-anesthesia Checklist: Patient identified, Emergency Drugs available, Suction available and Patient being monitored Patient Re-evaluated:Patient Re-evaluated prior to induction Oxygen Delivery Method: Ambu bag Preoxygenation: Pre-oxygenation with 100% oxygen Induction Type: IV induction Placement Confirmation: positive ETCO2 and breath sounds checked- equal and bilateral Dental Injury: Teeth and Oropharynx as per pre-operative assessment

## 2020-06-23 ENCOUNTER — Other Ambulatory Visit: Payer: Self-pay

## 2020-06-23 MED ORDER — CARVEDILOL 12.5 MG PO TABS: 12.5000 mg | ORAL_TABLET | Freq: Two times a day (BID) | ORAL | 3 refills | Status: DC

## 2020-06-23 NOTE — Telephone Encounter (Signed)
[facility] sent form requesting clarification on for CARVEDILOL. Confirming as of pt last visit with NA correct dose is CARVEDILOL 12.5MG  by mouth twice daily.

## 2020-07-24 ENCOUNTER — Telehealth: Payer: Self-pay | Admitting: *Deleted

## 2020-07-24 NOTE — Telephone Encounter (Signed)
   [facility] Pre-operative Risk Assessment    HEARTCARE STAFF: - Please ensure there is not already an duplicate clearance open for this procedure. - Under Visit Info/Reason for Call, type in Other and utilize the format Clearance MM/DD/YY or Clearance TBD. Do not use dashes or single digits. - If request is for dental extraction, please clarify the # of teeth to be extracted.  Request for surgical clearance:  1. What type of surgery is being performed? 1 TOOTH TO BE EXTRACTED   2. When is this surgery scheduled? TBD   3. What type of clearance is required (medical clearance vs. Pharmacy clearance to hold med vs. Both)? BOTH  4. Are there any medications that need to be held prior to surgery and how long? ELIQUIS   5. Practice name and name of physician performing surgery? [facility]; DR. Thomasena Edis [place]   6. What is the office phone number? [phone]   7.   What is the office fax number? [phone]  8.   Anesthesia type (None, local, MAC, general) ? LOCAL   Julaine Hua 07/24/2020, 10:35 AM  _________________________________________________________________   (provider comments below)

## 2020-07-24 NOTE — Telephone Encounter (Signed)
   Primary Cardiologist: [name], MD  Chart reviewed as part of pre-operative protocol coverage.    Simple dental extractions are considered low risk procedures per guidelines and generally do not require any specific cardiac clearance. It is also generally accepted that for simple extractions and dental cleanings, there is no need to interrupt blood thinner therapy.   SBE prophylaxis is not required for the patient.  I will route this recommendation to the requesting party via Epic fax function and remove from pre-op pool.  Please call with questions.  Kerin Ransom, PA-C 07/24/2020, 1:22 PM

## 2020-08-08 NOTE — Telephone Encounter (Signed)
Our office received a 2nd clearance request. I called the dental office though they closed at 12:30 pm today. I left a detailed message that our office did fax over clearance on 07/24/20 @ 1:23 pm. The clearance notes address the requesting office questions in regards to needing SBE and blood thinners. I will re-fax clearance notes from 07/24/20 again today. I left message in regards to their question to verify A1c they will need to reach out to the PCP.

## 2020-08-20 ENCOUNTER — Ambulatory Visit: Payer: Medicare Other | Admitting: Cardiovascular Disease

## 2020-08-26 ENCOUNTER — Other Ambulatory Visit: Payer: Self-pay

## 2020-08-26 ENCOUNTER — Ambulatory Visit: Payer: Medicare Other | Admitting: Cardiovascular Disease

## 2020-08-26 ENCOUNTER — Encounter: Payer: Self-pay | Admitting: Cardiovascular Disease

## 2020-08-26 VITALS — BP 134/72 | HR 70 | Ht 70.0 in | Wt 152.0 lb

## 2020-08-26 DIAGNOSIS — I2510 Atherosclerotic heart disease of native coronary artery without angina pectoris: Secondary | ICD-10-CM

## 2020-08-26 DIAGNOSIS — I6523 Occlusion and stenosis of bilateral carotid arteries: Secondary | ICD-10-CM

## 2020-08-26 DIAGNOSIS — I4819 Other persistent atrial fibrillation: Secondary | ICD-10-CM | POA: Diagnosis not present

## 2020-08-26 NOTE — Patient Instructions (Signed)
Medication Instructions:  Your physician recommends that you continue on your current medications as directed. Please refer to the Current Medication list given to you today.  *If you need a refill on your cardiac medications before your next appointment, please call your pharmacy*   Lab Work: None Ordered If you have labs (blood work) drawn today and your tests are completely normal, you will receive your results only by: [name] MyChart Message (if you have MyChart) OR . A paper copy in the mail If you have any lab test that is abnormal or we need to change your treatment, we will call you to review the results.   Testing/Procedures: Your physician has requested that you have a carotid duplex on Tuesday Sept. 21 at 1:00 pm. This test is an ultrasound of the carotid arteries in your neck. It looks at blood flow through these arteries that supply the brain with blood. Allow one hour for this exam. There are no restrictions or special instructions.    Follow-Up: At [facility], you and your health needs are our priority.  As part of our continuing mission to provide you with exceptional heart care, we have created designated Provider Care Teams.  These Care Teams include your primary Cardiologist (physician) and Advanced Practice Providers (APPs -  Physician Assistants and Nurse Practitioners) who all work together to provide you with the care you need, when you need it.  We recommend signing up for the patient portal called "MyChart".  Sign up information is provided on this After Visit Summary.  MyChart is used to connect with patients for Virtual Visits (Telemedicine).  Patients are able to view lab/test results, encounter notes, upcoming appointments, etc.  Non-urgent messages can be sent to your provider as well.   To learn more about what you can do with MyChart, go to [email or web address].    Your next appointment:   6 month(s)  The format for your next appointment:   In  Person  Provider:   You may see [name], MD or one of the following Advanced Practice Providers on your designated Care Team:    Richardson Dopp, PA-C  [place], [place]

## 2020-08-26 NOTE — Progress Notes (Signed)
Cardiology Office Note   Date:  08/26/2020   ID:  BERMAN GRAINGER, DOB [birth year], MRN [id number]  PCP:  Lavone Orn, MD  Cardiologist:   [name], MD   Chief Complaint  Patient presents with  . Coronary Artery Disease  . Atrial Fibrillation   1. CAD, s/p LAD stent 2009 2. HTN 3. Diabetes Mellitus 4. Hyperlipidemia 5. Carotid artery stenosis  - Right - mod- severe    [name] is doing well from a cardiac standpoint. He has not had any episodes of chest pain or shortness breath. [name] been exercising on a regular basis. He had a nice garden this year.   Apr 26, 2013:  [name] is doing well. No CP , no dyspnea. He has put in his garden this year.   Nov. 12, 2014:  Apr 24, 2014:  [name] is doing well. No CP , no dyspnea.  hes getting his garden going.   Nov. 9, 2015:  [name] is doing well. His BP was elevated at his last visit. We increased his Lisinopril/HCTZ and his Bp is much better.    May 15, 2015:   Todd Armstrong is a 86 y.o. male who presents for his CAD Doing well.   No CP or dyspnea.  Still caring for his wife who is ill.  Still very busy working out in the garden   Nov. 29, 2016:  Doing well.  No CP or dyspnea.  Still very busy taking care of his wife, Todd Armstrong  She is having lots of bleeding from her bowels.   May 13, 2016:  [name] is doing well.  Still taking care of Todd Armstrong  - she has more bad days than good days .  Enjoys his garden  BP and HR are very good No CP or dyspnea  Nov. 3, 2017:  Doing well.   Worked in his garden this past summer .  BP is well controlled.  HR is slow,  But no syncopal episodes  Having more challenges with wife , Todd Armstrong now.   She seems to have more dementia .  July 18. 2018  No issues, no CP .  BP and HR are well controlled.   January 18, 2018:  Doing well.   Stays busy.    He has moderate to severe right carotid artery stenosis.  Recommendation is for a follow-up in 12 months  which will be November, 2019.  Sept. 4, 2019:  Tough time dealing with wife Todd Armstrong,   S/p recent right carotid endarectomy [name], MD )  No CP Has occasional dizziness   February 19, 2019:  Doing well .    No CP , no dyspnea.  Still worried about wife Todd Armstrong ,  She has a UTI  BP is a bit elevated.  Avoids salty foods  Wt was 154 lbs in Sept, 2019,   [place] today is 148.8 lbs.   October 17, 2019: [name] is seen today for a follow-up visit.  He has a history of coronary artery disease and is status post PTCA and stenting.  He also has carotid artery disease, hypertension, dyslipidemia and diabetes mellitus. Has rare episodes of orthostasis  These episodes are not so severe that he wants to consider decreasing the dose of his lisinopril HCTZ.[name]  He denies any chest pain or shortness of breath. Wife passed away since I last saw him   Apr 23, 2020:  Todd Armstrong is in atrial fib today .  He feels fine.  Cannot tell  that his HR is irreg.  No cp ,  Slight DOE with exertion compared to last year he has been able to do all of his normal activities.  He has noticed a little bit more shortness of breath when walking out the yard.   June 9 , 2021:  Todd Armstrong is seen today for follow-up of his atrial fibrillation.  He was found to have atrial fibrillation about a month ago. Has stopped driving .  Slow reflexes.  He is still in AF We discussed cardioversion     Sept. 14, 2021:  Todd Armstrong is seen today for follow up of his afib and CAD We performed a cardioversion in June, 2021 No cp, no dyspnea Is cramping in his hands and left leg  Get DOE with any activity  No angina  Still eats some processed meats. ( hot dogs, sausage)  Echocardiogram from June, 2021 reveals normal left ventricular systolic function.  We were not able to determine his diastolic parameters.  He has moderate mitral regurgitation.  Mild aortic insufficiency.  Mild tricuspid insufficiency.  Gets up every 2-3 hours to urinate  at night   Past Medical History:  Diagnosis Date  . Acute myocardial infarction   . Carotid stenosis   . Coronary artery disease    PTCA and stenting of his right coronary artery. 3.5 x 16-mm Liberte stent.              . Diabetes mellitus without complication ([place])   . Dyslipidemia   . Hyperlipemia   . Hypertension   . Partial tear of rotator cuff    right shoulder  . Peripheral vascular disease ([place])   . Pulmonary hypertension (H[facility])    moderate pulmonary HTN by 2007 echo (RVSP 52 mmHg)  . Stroke ([place])   . Wears dentures     Past Surgical History:  Procedure Laterality Date  . CARDIAC CATHETERIZATION  11/05/2008  . CARDIAC CATHETERIZATION  10/22/1993   EF 60%  . CARDIOVERSION N/A 06/05/2020   Procedure: CARDIOVERSION;  Surgeon: Acie Fredrickson Wonda Cheng, MD;  [facility] ENDOSCOPY;  Service: Cardiovascular;  Laterality: N/A;  . CAROTID ENDARTERECTOMY Right 03/23/2018  . CORONARY ANGIOPLASTY WITH STENT PLACEMENT    . ENDARTERECTOMY Right 03/23/2018   Procedure: RIGHT CAROTID ARTERY ENDARTERECTOMY;  Surgeon: Waynetta Sandy, MD;  Location: [facility];  Service: Vascular;  Laterality: Right;  . FRACTURE SURGERY     righ fibula and tibia  . HERNIA REPAIR  1984  . KNEE ARTHROSCOPY Left   . MULTIPLE TOOTH EXTRACTIONS    . PATCH ANGIOPLASTY Right 03/23/2018   Procedure: WITH XENOSURE 1CM X 6CM PATCH ANGIOPLASTY;  Surgeon: Waynetta Sandy, MD;  Location: [facility];  Service: Vascular;  Laterality: Right;  . SHOULDER ARTHROSCOPY Right 06/09/2017   Procedure: ARTHROSCOPY SHOULDER SUBACROMIAL DECOMPRESSION AND ACROMIOPLASTY;  Surgeon: Melrose Nakayama, MD;  Location: [facility];  Service: Orthopedics;  Laterality: Right;  GENERAL ANESTHESIA WITH BLOCK     Current Outpatient Medications  Medication Sig Dispense Refill  . apixaban (ELIQUIS) 5 MG TABS tablet Take 1 tablet (5 mg total) by mouth 2 (two) times daily. 60 tablet 11  . carvedilol (COREG) 12.5 MG tablet Take 1 tablet (12.5 mg  total) by mouth 2 (two) times daily. 180 tablet 3  . clopidogrel (PLAVIX) 75 MG tablet TAKE 1 TABLET BY MOUTH  DAILY 90 tablet 3  . lisinopril-hydrochlorothiazide (ZESTORETIC) 20-25 MG tablet TAKE 1 TABLET BY MOUTH  DAILY 90 tablet 2  . metFORMIN (GLUCOPHAGE)  1000 MG tablet Take 1,000 mg by mouth 2 (two) times daily with a meal.      . nitroGLYCERIN (NITROSTAT) 0.4 MG SL tablet Place 1 tablet (0.4 mg total) under the tongue every 5 (five) minutes as needed for chest pain. 25 tablet 6  . rosuvastatin (CRESTOR) 10 MG tablet TAKE 1 TABLET BY MOUTH  DAILY 90 tablet 2  . tamsulosin (FLOMAX) 0.4 MG CAPS capsule Take 0.4 mg by mouth at bedtime.     . triamcinolone cream (KENALOG) 0.1 % Apply 1 application topically as needed.     No current facility-administered medications for this visit.    Allergies:   Patient has no known allergies.    Social History:  The patient  reports that he has quit smoking. His smoking use included cigarettes. He has never used smokeless tobacco. He reports that he does not drink alcohol and does not use drugs.   Family History:  The patient's family history includes Heart attack in his father.    ROS: As per current history.  All other systems are negative.  Physical Exam: Blood pressure 134/72, pulse 70, height 5\' 10"  (1.778 m), weight 152 lb (68.9 kg), SpO2 95 %.  GEN:  Well nourished, well developed in no acute distress HEENT: Normal NECK: No JVD; No carotid bruits LYMPHATICS: No lymphadenopathy CARDIAC: Irreg. Irreg.   RESPIRATORY:  Clear to auscultation without rales, wheezing or rhonchi  ABDOMEN: Soft, non-tender, non-distended MUSCULOSKELETAL:  No edema; No deformity  SKIN: Warm and dry NEUROLOGIC:  Alert and oriented x 3    EKG:         Recent Labs: 04/22/2020: ALT 15 04/23/2020: TSH 0.904 05/21/2020: BUN 30; Creatinine, Ser 1.59; Hemoglobin 11.8; Platelets 180; Potassium 4.9; Sodium 138    Lipid Panel    Component Value Date/Time   CHOL  148 04/22/2020 1052   TRIG 119 04/22/2020 1052   HDL 75 04/22/2020 1052   CHOLHDL 2.0 04/22/2020 1052   CHOLHDL 2.3 10/15/2016 1215   VLDL 34 (H) 10/15/2016 1215   LDLCALC 52 04/22/2020 1052      Wt Readings from Last 3 Encounters:  08/26/20 152 lb (68.9 kg)  06/05/20 152 lb (68.9 kg)  05/21/20 153 lb 8 oz (69.6 kg)      Other studies Reviewed: Additional studies/ records that were reviewed today include: . Review of the above records demonstrates:    ASSESSMENT AND PLAN:  1.  Atrial fib:    - stable ,  Cont eliquis .  HR is well controlled.    2.  Carotid artery stenosis: He has stable moderate right carotid disease with mild left carotid disease.  Continue current medications.  Continue rosuvastatin. [facility]ed [facility]  Will reorder carotid duplex at [facility] .    2. HTN -     BP is well ctonrolled.   He still eats some salty foods including hotdogs and sausage.  I suspect that this might be worsening his pulmonary hypertension.  Of asked him to decrease his salt intake which might help his breathing.  3. Diabetes Mellitus  4. Hyperlipidemia - labs are stable  5. CAD, s/p LAD stent 2009 -       6.  Pulmonary hypertension: He has moderate pulmonary hypertension.  I have encouraged him to greatly decrease his salt intake.  Some of this might be age-related.  Current medicines are reviewed at length with the patient today.  The patient does not have concerns regarding medicines.  The following  changes have been made:  no change  Labs/ tests ordered today include:   No orders of the defined types were placed in this encounter.  Disposition:       [name], MD  08/26/2020 12:06 PM    [address], [place], [place]  [address] Phone: [phone]; Fax: [phone]

## 2020-09-02 ENCOUNTER — Ambulatory Visit (HOSPITAL_COMMUNITY)
Admission: RE | Admit: 2020-09-02 | Discharge: 2020-09-02 | Disposition: A | Discharge status: Home / Self Care | Payer: Medicare Other | Source: Ambulatory Visit | Attending: Vascular Surgery | Admitting: Vascular Surgery

## 2020-09-02 ENCOUNTER — Other Ambulatory Visit: Payer: Self-pay

## 2020-09-02 ENCOUNTER — Ambulatory Visit: Payer: Medicare Other | Admitting: Physician Assistant

## 2020-09-02 VITALS — BP 134/85 | HR 95 | Temp 97.9°F | Resp 20 | Ht 70.0 in | Wt 151.6 lb

## 2020-09-02 DIAGNOSIS — I6523 Occlusion and stenosis of bilateral carotid arteries: Secondary | ICD-10-CM

## 2020-09-02 NOTE — Progress Notes (Signed)
HISTORY AND PHYSICAL     CC:  follow up. Requesting Provider:  Lavone Orn, MD  HPI: This is a 86 y.o. male here for follow up for carotid artery stenosis.  Pt is s/p right CEA on 03/23/2018 by Dr. Donzetta Matters.    Pt was last seen November 2020 and at that time he remained asymptomatic.    Pt returns today for follow up.  Pt denies any amaurosis fugax, speech difficulties, weakness, numbness, paralysis or clumsiness.  Since his last visit, he has been diagnosed with afib and is now on Eliquis.  He continues to take his Plavix as well.    The pt is on a statin for cholesterol management.  The pt is not on a daily aspirin.   Other AC:  Eliquis/Plavix The pt is on BB, ACEI for hypertension.   The pt is diabetic.   Tobacco hx:  former  Pt does not have family hx of AAA.  Past Medical History:  Diagnosis Date  . Acute myocardial infarction   . Carotid stenosis   . Coronary artery disease    PTCA and stenting of his right coronary artery. 3.5 x 16-mm Liberte stent.              . Diabetes mellitus without complication ([place])   . Dyslipidemia   . Hyperlipemia   . Hypertension   . Partial tear of rotator cuff    right shoulder  . Peripheral vascular disease ([place])   . Pulmonary hypertension (HCC)    moderate pulmonary HTN by 2007 echo (RVSP 52 mmHg)  . Stroke ([place])   . Wears dentures     Past Surgical History:  Procedure Laterality Date  . CARDIAC CATHETERIZATION  11/05/2008  . CARDIAC CATHETERIZATION  10/22/1993   EF 60%  . CARDIOVERSION N/A 06/05/2020   Procedure: CARDIOVERSION;  Surgeon: Acie Fredrickson Wonda Cheng, MD;  Location: [facility] ENDOSCOPY;  Service: Cardiovascular;  Laterality: N/A;  . CAROTID ENDARTERECTOMY Right 03/23/2018  . CORONARY ANGIOPLASTY WITH STENT PLACEMENT    . ENDARTERECTOMY Right 03/23/2018   Procedure: RIGHT CAROTID ARTERY ENDARTERECTOMY;  Surgeon: Waynetta Sandy, MD;  Location: [place];  Service: Vascular;  Laterality: Right;  . FRACTURE SURGERY     righ  fibula and tibia  . HERNIA REPAIR  1984  . KNEE ARTHROSCOPY Left   . MULTIPLE TOOTH EXTRACTIONS    . PATCH ANGIOPLASTY Right 03/23/2018   Procedure: WITH XENOSURE 1CM X 6CM PATCH ANGIOPLASTY;  Surgeon: Waynetta Sandy, MD;  Location: [place];  Service: Vascular;  Laterality: Right;  . SHOULDER ARTHROSCOPY Right 06/09/2017   Procedure: ARTHROSCOPY SHOULDER SUBACROMIAL DECOMPRESSION AND ACROMIOPLASTY;  Surgeon: Melrose Nakayama, MD;  Location: [place];  Service: Orthopedics;  Laterality: Right;  GENERAL ANESTHESIA WITH BLOCK    No Known Allergies  Current Outpatient Medications  Medication Sig Dispense Refill  . apixaban (ELIQUIS) 5 MG TABS tablet Take 1 tablet (5 mg total) by mouth 2 (two) times daily. 60 tablet 11  . carvedilol (COREG) 12.5 MG tablet Take 1 tablet (12.5 mg total) by mouth 2 (two) times daily. 180 tablet 3  . clopidogrel (PLAVIX) 75 MG tablet TAKE 1 TABLET BY MOUTH  DAILY 90 tablet 3  . lisinopril-hydrochlorothiazide (ZESTORETIC) 20-25 MG tablet TAKE 1 TABLET BY MOUTH  DAILY 90 tablet 2  . metFORMIN (GLUCOPHAGE) 1000 MG tablet Take 1,000 mg by mouth 2 (two) times daily with a meal.      . nitroGLYCERIN (NITROSTAT) 0.4 MG SL tablet Place 1  tablet (0.4 mg total) under the tongue every 5 (five) minutes as needed for chest pain. 25 tablet 6  . rosuvastatin (CRESTOR) 10 MG tablet TAKE 1 TABLET BY MOUTH  DAILY 90 tablet 2  . tamsulosin (FLOMAX) 0.4 MG CAPS capsule Take 0.4 mg by mouth at bedtime.     . triamcinolone cream (KENALOG) 0.1 % Apply 1 application topically as needed.     No current facility-administered medications for this visit.    Family History  Problem Relation Age of Onset  . Heart attack Father     Social History   Socioeconomic History  . Marital status: Widowed    Spouse name: Not on file  . Number of children: Not on file  . Years of education: Not on file  . Highest education level: Not on file  Occupational History  . Not on file  Tobacco  Use  . Smoking status: Former Smoker    Types: Cigarettes  . Smokeless tobacco: Never Used  . Tobacco comment: quit smoking cigarettes > 40 years ago  Vaping Use  . Vaping Use: Never used  Substance and Sexual Activity  . Alcohol use: No  . Drug use: No  . Sexual activity: Not on file  Other Topics Concern  . Not on file  Social History Narrative  . Not on file   Social Determinants of Health   Financial Resource Strain:   . Difficulty of Paying Living Expenses: Not on file  Food Insecurity:   . Worried About Charity fundraiser in the Last Year: Not on file  . Ran Out of Food in the Last Year: Not on file  Transportation Needs:   . Lack of Transportation (Medical): Not on file  . Lack of Transportation (Non-Medical): Not on file  Physical Activity:   . Days of Exercise per Week: Not on file  . Minutes of Exercise per Session: Not on file  Stress:   . Feeling of Stress : Not on file  Social Connections:   . Frequency of Communication with Friends and Family: Not on file  . Frequency of Social Gatherings with Friends and Family: Not on file  . Attends Religious Services: Not on file  . Active Member of Clubs or Organizations: Not on file  . Attends Archivist Meetings: Not on file  . Marital Status: Not on file  Intimate Partner Violence:   . Fear of Current or Ex-Partner: Not on file  . Emotionally Abused: Not on file  . Physically Abused: Not on file  . Sexually Abused: Not on file     REVIEW OF SYSTEMS:   [X]  denotes positive finding, [ ]  denotes negative finding Cardiac  Comments:  Chest pain or chest pressure:    Shortness of breath upon exertion: x   Short of breath when lying flat:    Irregular heart rhythm:        Vascular    Pain in calf, thigh, or hip brought on by ambulation:    Pain in feet at night that wakes you up from your sleep:     Blood clot in your veins:    Leg swelling:         Pulmonary    Oxygen at home:    Productive  cough:     Wheezing:         Neurologic    Sudden weakness in arms or legs:     Sudden numbness in arms or legs:  Sudden onset of difficulty speaking or slurred speech:    Temporary loss of vision in one eye:     Problems with dizziness:         Gastrointestinal    Blood in stool:     Vomited blood:         Genitourinary    Burning when urinating:     Blood in urine:        Psychiatric    Major depression:         Hematologic    Bleeding problems:    Problems with blood clotting too easily:        Skin    Rashes or ulcers:        Constitutional    Fever or chills:      PHYSICAL EXAMINATION:  Today's Vitals   09/02/20 1335 09/02/20 1338  BP: 138/73 134/85  Pulse: 95   Resp: 20   Temp: 97.9 F (36.6 C)   TempSrc: Temporal   SpO2: 96%   Weight: 151 lb 9.6 oz (68.8 kg)   Height: 5\' 10"  (1.778 m)    Body mass index is 21.75 kg/m.   General:  WDWN in NAD; vital signs documented above Gait: Not observed HENT: WNL, normocephalic Pulmonary: normal non-labored breathing Cardiac: irregular HR, without murmur without carotid bruits Abdomen: soft, NT, no masses Skin: without rashes Vascular Exam/Pulses:  Right Left  Radial 2+ (normal) 2+ (normal)  Ulnar 2+ (normal) 2+ (normal)  Popliteal Unable to palpate Unable to palpate  DP 2+ (normal) Unable to palpate  PT Unable to palpate 2+ (normal)   Extremities: without ischemic changes, without Gangrene , without cellulitis; without open wounds;  Musculoskeletal: no muscle wasting or atrophy  Neurologic: A&O X 3 Psychiatric:  The pt has Normal affect.   Non-Invasive Vascular Imaging:   Carotid Duplex on 09/02/2020: Right:  40-59% ICA stenosis Left:  1-39% ICA stenosis   Previous Carotid duplex on 10/25/2019: Right: 40-59% ICA stenosis Left:   1-39% ICA stenosis    ASSESSMENT/PLAN:: 86 y.o. male here for follow up carotid artery stenosis.  -duplex today reveals 40-59% right and 1-39% left ICA  stenosis.  Pt remains asymptomatic.   -discussed s/s of stroke with pt and he understands should he develop any of these sx, he will go to the nearest ER. -pt will f/u in one year with carotid duplex -pt will call sooner should they have any issues.   Leontine Locket, [facility] [phone]  Clinic MD:  Scot Dock on call MD

## 2020-10-12 ENCOUNTER — Other Ambulatory Visit: Payer: Self-pay | Admitting: Cardiovascular Disease

## 2020-12-15 DIAGNOSIS — E119 Type 2 diabetes mellitus without complications: Secondary | ICD-10-CM | POA: Diagnosis not present

## 2020-12-15 DIAGNOSIS — H0102B Squamous blepharitis left eye, upper and lower eyelids: Secondary | ICD-10-CM | POA: Diagnosis not present

## 2020-12-15 DIAGNOSIS — H0102A Squamous blepharitis right eye, upper and lower eyelids: Secondary | ICD-10-CM | POA: Diagnosis not present

## 2020-12-15 DIAGNOSIS — H2513 Age-related nuclear cataract, bilateral: Secondary | ICD-10-CM | POA: Diagnosis not present

## 2020-12-15 DIAGNOSIS — H04123 Dry eye syndrome of bilateral lacrimal glands: Secondary | ICD-10-CM | POA: Diagnosis not present

## 2020-12-19 DIAGNOSIS — I2510 Atherosclerotic heart disease of native coronary artery without angina pectoris: Secondary | ICD-10-CM | POA: Diagnosis not present

## 2020-12-19 DIAGNOSIS — I252 Old myocardial infarction: Secondary | ICD-10-CM | POA: Diagnosis not present

## 2020-12-19 DIAGNOSIS — E0869 Diabetes mellitus due to underlying condition with other specified complication: Secondary | ICD-10-CM | POA: Diagnosis not present

## 2020-12-19 DIAGNOSIS — I10 Essential (primary) hypertension: Secondary | ICD-10-CM | POA: Diagnosis not present

## 2020-12-19 DIAGNOSIS — I482 Chronic atrial fibrillation, unspecified: Secondary | ICD-10-CM | POA: Diagnosis not present

## 2020-12-19 DIAGNOSIS — G459 Transient cerebral ischemic attack, unspecified: Secondary | ICD-10-CM | POA: Diagnosis not present

## 2020-12-19 DIAGNOSIS — G8929 Other chronic pain: Secondary | ICD-10-CM | POA: Diagnosis not present

## 2020-12-19 DIAGNOSIS — E7800 Pure hypercholesterolemia, unspecified: Secondary | ICD-10-CM | POA: Diagnosis not present

## 2021-01-02 ENCOUNTER — Other Ambulatory Visit: Payer: Self-pay | Admitting: Physician Assistant

## 2021-01-02 ENCOUNTER — Ambulatory Visit
Admission: RE | Admit: 2021-01-02 | Discharge: 2021-01-02 | Disposition: A | Discharge status: Home / Self Care | Payer: Medicare Other | Source: Ambulatory Visit | Attending: Physician Assistant | Admitting: Physician Assistant

## 2021-01-02 DIAGNOSIS — R053 Chronic cough: Secondary | ICD-10-CM

## 2021-01-02 DIAGNOSIS — R059 Cough, unspecified: Secondary | ICD-10-CM | POA: Diagnosis not present

## 2021-01-14 DIAGNOSIS — Z85828 Personal history of other malignant neoplasm of skin: Secondary | ICD-10-CM | POA: Diagnosis not present

## 2021-01-14 DIAGNOSIS — L812 Freckles: Secondary | ICD-10-CM | POA: Diagnosis not present

## 2021-01-14 DIAGNOSIS — L300 Nummular dermatitis: Secondary | ICD-10-CM | POA: Diagnosis not present

## 2021-01-14 DIAGNOSIS — D1801 Hemangioma of skin and subcutaneous tissue: Secondary | ICD-10-CM | POA: Diagnosis not present

## 2021-01-14 DIAGNOSIS — L570 Actinic keratosis: Secondary | ICD-10-CM | POA: Diagnosis not present

## 2021-01-19 ENCOUNTER — Other Ambulatory Visit: Payer: Self-pay | Admitting: Physician Assistant

## 2021-01-19 ENCOUNTER — Ambulatory Visit
Admission: RE | Admit: 2021-01-19 | Discharge: 2021-01-19 | Disposition: A | Discharge status: Home / Self Care | Payer: Medicare Other | Source: Ambulatory Visit | Attending: Physician Assistant | Admitting: Physician Assistant

## 2021-01-19 DIAGNOSIS — R635 Abnormal weight gain: Secondary | ICD-10-CM

## 2021-01-19 DIAGNOSIS — R0600 Dyspnea, unspecified: Secondary | ICD-10-CM

## 2021-01-19 DIAGNOSIS — J90 Pleural effusion, not elsewhere classified: Secondary | ICD-10-CM | POA: Diagnosis not present

## 2021-01-19 DIAGNOSIS — J690 Pneumonitis due to inhalation of food and vomit: Secondary | ICD-10-CM

## 2021-01-19 DIAGNOSIS — R0609 Other forms of dyspnea: Secondary | ICD-10-CM

## 2021-01-19 DIAGNOSIS — S2231XA Fracture of one rib, right side, initial encounter for closed fracture: Secondary | ICD-10-CM | POA: Diagnosis not present

## 2021-01-19 DIAGNOSIS — S2241XA Multiple fractures of ribs, right side, initial encounter for closed fracture: Secondary | ICD-10-CM | POA: Diagnosis not present

## 2021-01-25 ENCOUNTER — Other Ambulatory Visit: Payer: Self-pay | Admitting: Cardiovascular Disease

## 2021-01-26 ENCOUNTER — Ambulatory Visit
Admission: RE | Admit: 2021-01-26 | Discharge: 2021-01-26 | Disposition: A | Discharge status: Home / Self Care | Payer: Medicare Other | Source: Ambulatory Visit | Attending: Physician Assistant | Admitting: Physician Assistant

## 2021-01-26 ENCOUNTER — Other Ambulatory Visit: Payer: Self-pay | Admitting: Physician Assistant

## 2021-01-26 DIAGNOSIS — J189 Pneumonia, unspecified organism: Secondary | ICD-10-CM

## 2021-01-26 DIAGNOSIS — R609 Edema, unspecified: Secondary | ICD-10-CM

## 2021-01-26 DIAGNOSIS — J90 Pleural effusion, not elsewhere classified: Secondary | ICD-10-CM | POA: Diagnosis not present

## 2021-01-26 DIAGNOSIS — R7989 Other specified abnormal findings of blood chemistry: Secondary | ICD-10-CM | POA: Diagnosis not present

## 2021-01-26 DIAGNOSIS — R040 Epistaxis: Secondary | ICD-10-CM | POA: Diagnosis not present

## 2021-01-26 DIAGNOSIS — R0601 Orthopnea: Secondary | ICD-10-CM | POA: Diagnosis not present

## 2021-01-29 DIAGNOSIS — I509 Heart failure, unspecified: Secondary | ICD-10-CM | POA: Diagnosis not present

## 2021-02-04 ENCOUNTER — Ambulatory Visit: Payer: Medicare Other | Admitting: Internal Medicine

## 2021-02-04 ENCOUNTER — Other Ambulatory Visit: Payer: Self-pay

## 2021-02-04 ENCOUNTER — Encounter: Payer: Self-pay | Admitting: Internal Medicine

## 2021-02-04 VITALS — BP 130/80 | HR 101 | Temp 97.7°F | Ht 68.0 in | Wt 152.8 lb

## 2021-02-04 DIAGNOSIS — Z8701 Personal history of pneumonia (recurrent): Secondary | ICD-10-CM | POA: Diagnosis not present

## 2021-02-04 DIAGNOSIS — R0989 Other specified symptoms and signs involving the circulatory and respiratory systems: Secondary | ICD-10-CM

## 2021-02-04 DIAGNOSIS — K029 Dental caries, unspecified: Secondary | ICD-10-CM

## 2021-02-04 NOTE — Patient Instructions (Addendum)
ICD-10-CM   1. History of pneumonia  Z87.01   2. Bibasilar crackles  R09.89   3. 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    Glad you are overall better and continue to get better Your heart rate is a little bit fast and can account for some of your shortness of breath Is still some residual infiltrates in the right lung along with crackles particularly in the right greater than left base Your carious teeth could have played a role in the pneumonia  Plan -Discussed with primary care about a fast heart rate -Please see your dentist for your carious teeth which could be a culprit for your pneumonia -In April 2022 do full pulmonary function test -In April 2022 to do high-resolution CT chest supine and prone -this is to rule out any underlying pulmonary fibrosis which is possible based on a chest x-ray findings and exam -  Follow-up -Return to see Dr. Chase Caller 30-minute slot in April 2022

## 2021-02-04 NOTE — Progress Notes (Signed)
OV 02/04/2021  Subjective:  Patient ID: Todd Armstrong, male , DOB: 10-07-1931 , age 87 y.o. , MRN: [id number] , ADDRESS: [address] [place] [id number] PCP Lavone Orn, MD Patient Care Team: Lavone Orn, MD as PCP - General (Internal Medicine) Nahser, Wonda Cheng, MD as PCP - Cardiology (Cardiology)  This Provider for this visit: Treatment Team:  Attending Provider: Brand Males, MD    02/04/2021 -   Chief Complaint  Patient presents with  . Consult    Pt being referred by Thayer Ohm, PA due to pneumonia.  Pt has had pneumonia off and on since January 2021. Denies any current complaints of cough, SOB, or chest tightness.     HPI Todd Armstrong 87 y.o. -referred for pneumonia.  History is gained from talking to the patient and also review of the outside medical records.  He is up-to-date with his Covid vaccine.  He tells me and it is confirmed I reviewed the chart that he started having insidious onset of shortness of breath that is got progressive associated with severe cough.  The cough persisted despite stopping his lisinopril.  The onset of this was sometime in December 2021.  It was at this time Dr. Laurann Montana stopped his lisinopril and then he thought his cough got better but by early January 2022 his cough got worse.  Then by late January 2022 he was started on PPI.  Chest x-ray at this time showed right middle and lower lobe pneumonia [documented below].  He was started on azithromycin for 5 days and Augmentin for 7 days.  When he saw a nurse practitioner on 01/19/2021 his symptoms had resolved but he was having weakness.  Chest x-ray at this time showed worsening and a referral was made [see below].  However 01/26/2021 chest x-ray showed further improvement.  I personally visualized all these chest x-rays.  At this point in time he tells me that he is actually feeling better.  He has very mild residual shortness of breath.  The cough is gone.  He denies any  aspiration or dysphagia.  Denies any vomiting.  Denies nausea.  Denies loss of consciousness.  Denies acid reflux.  Has occasional postnasal drip.  He does have dentures.  Beneath the dentures a couple maxillary carious teeth.  Has not seen the dentist in a while.  He denies any down pillows.  Denies any mold or mildew in the house denies any hot tub use.  He only has a cat in the house.  He is wondering how he got the pneumonia.  He says he was Covid negative.   Radiologic review upon personal visualization report summary below  He has had 3 chest x-rays.  The first 1 was 01/02/2021 that showed patchy areas of interstitial prominence in the right lung most concerning for right mid to lower lobe pneumonia.  It is at this point he got treated with antibiotics.  He had follow-up chest x-ray on 01/19/2021 that I also personally visualized and that showed progression.  At this point in time the referral was made.  The next chest x-ray 01/26/2021 showed improvement.  Other findings include rib deformities and also emphysema  Walking desaturation test and 85 feet x 3 laps: He only did 2 laps and stop because of shortness of breath.  Resting pulse ox 99%.  Resting heart rate 94/min.  Final pulse ox 98% and got tachycardic at 133/min.  He walked at an average pace for age.  He got limited by his tachycardia   has a past medical history of Acute myocardial infarction, Carotid stenosis, Coronary artery disease, Diabetes mellitus without complication ([place]), Dyslipidemia, Hyperlipemia, Hypertension, Partial tear of rotator cuff, Peripheral vascular disease ([place]), Pulmonary hypertension ([place]), Stroke ([place]), and Wears dentures.   reports that he has quit smoking. His smoking use included cigarettes. He has a 5.00 pack-year smoking history. He has never used smokeless tobacco.  Past Surgical History:  Procedure Laterality Date  . CARDIAC CATHETERIZATION  11/05/2008  . CARDIAC CATHETERIZATION  10/22/1993   EF 60%  .  CARDIOVERSION N/A 06/05/2020   Procedure: CARDIOVERSION;  Surgeon: Acie Fredrickson Wonda Cheng, MD;  Location: [facility] ENDOSCOPY;  Service: Cardiovascular;  Laterality: N/A;  . CAROTID ENDARTERECTOMY Right 03/23/2018  . CORONARY ANGIOPLASTY WITH STENT PLACEMENT    . ENDARTERECTOMY Right 03/23/2018   Procedure: RIGHT CAROTID ARTERY ENDARTERECTOMY;  Surgeon: Waynetta Sandy, MD;  Location: [place];  Service: Vascular;  Laterality: Right;  . FRACTURE SURGERY     righ fibula and tibia  . HERNIA REPAIR  1984  . KNEE ARTHROSCOPY Left   . MULTIPLE TOOTH EXTRACTIONS    . PATCH ANGIOPLASTY Right 03/23/2018   Procedure: WITH XENOSURE 1CM X 6CM PATCH ANGIOPLASTY;  Surgeon: Waynetta Sandy, MD;  Location: [place];  Service: Vascular;  Laterality: Right;  . SHOULDER ARTHROSCOPY Right 06/09/2017   Procedure: ARTHROSCOPY SHOULDER SUBACROMIAL DECOMPRESSION AND ACROMIOPLASTY;  Surgeon: Melrose Nakayama, MD;  Location: [place];  Service: Orthopedics;  Laterality: Right;  GENERAL ANESTHESIA WITH BLOCK    No Known Allergies  Immunization History  Administered Date(s) Administered  . Influenza Split 10/07/2009, 10/12/2010, 09/13/2011, 10/17/2012, 09/10/2013, 10/07/2014  . Influenza, High Dose Seasonal PF 08/05/2015, 09/21/2016, 10/13/2017, 09/12/2018, 08/22/2019, 10/31/2020  . PFIZER(Purple Top)SARS-COV-2 Vaccination 02/14/2020, 03/12/2020, 10/10/2020  . Pneumococcal Conjugate-13 01/31/2014  . Pneumococcal Polysaccharide-23 09/12/2001  . Td 12/13/2005  . Tdap 08/09/2016  . Zoster 12/08/2011    Family History  Problem Relation Age of Onset  . Heart attack Father      Current Outpatient Medications:  .  apixaban (ELIQUIS) 5 MG TABS tablet, Take 1 tablet (5 mg total) by mouth 2 (two) times daily., Disp: 60 tablet, Rfl: 11 .  carvedilol (COREG) 12.5 MG tablet, Take 12.5 mg by mouth in the morning and at bedtime., Disp: , Rfl:  .  clopidogrel (PLAVIX) 75 MG tablet, TAKE 1 TABLET BY MOUTH  DAILY, Disp: 90  tablet, Rfl: 2 .  furosemide (LASIX) 40 MG tablet, Take by mouth., Disp: , Rfl:  .  melatonin 3 MG TABS tablet, Take 3 mg by mouth at bedtime as needed., Disp: , Rfl:  .  metFORMIN (GLUCOPHAGE) 1000 MG tablet, Take 1,000 mg by mouth 2 (two) times daily with a meal., Disp: , Rfl:  .  nitroGLYCERIN (NITROSTAT) 0.4 MG SL tablet, Place 1 tablet (0.4 mg total) under the tongue every 5 (five) minutes as needed for chest pain., Disp: 25 tablet, Rfl: 6 .  omeprazole (PRILOSEC) 20 MG capsule, 1 capsule before meal, Disp: , Rfl:  .  rosuvastatin (CRESTOR) 10 MG tablet, TAKE 1 TABLET BY MOUTH  DAILY, Disp: 90 tablet, Rfl: 3 .  tamsulosin (FLOMAX) 0.4 MG CAPS capsule, Take 0.4 mg by mouth at bedtime. , Disp: , Rfl:  .  triamcinolone cream (KENALOG) 0.1 %, Apply 1 application topically as needed., Disp: , Rfl:  .  triamterene-hydrochlorothiazide (MAXZIDE-25) 37.5-25 MG tablet, Take 1 tablet by mouth daily., Disp: , Rfl:  Objective:   Vitals:   02/04/21 0925  BP: 130/80  Pulse: (!) 101  Temp: 97.7 F (36.5 C)  TempSrc: Temporal  SpO2: 99%  Weight: 152 lb 12.8 oz (69.3 kg)  Height: 5\' 8"  (1.727 m)    Estimated body mass index is 23.23 kg/m as calculated from the following:   Height as of this encounter: 5\' 8"  (1.727 m).   Weight as of this encounter: 152 lb 12.8 oz (69.3 kg).  @WEIGHTCHANGE @  [employer]   02/04/21 0925  Weight: 152 lb 12.8 oz (69.3 kg)     Physical Exam General: No distress. Frail but looks well Neuro: Alert and Oriented x 3. GCS 15. Speech normal Psych: Pleasant Resp:  Barrel Chest - no.  Wheeze - no, Crackles -right lower lobe crackles.  Some crackles in the left lower lobe, No overt respiratory distress CVS: Normal heart sounds. Murmurs - no Ext: Stigmata of Connective Tissue Disease - no HEENT: Normal upper airway. PEERL +. No post nasal drip.  Has dentures.  has got carious teeth in the maxilla        Assessment:       ICD-10-CM   1. History of  pneumonia  Z87.01   2. Bibasilar crackles  R09.89   3. 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 History of pneumonia  Z87.01   2. Bibasilar crackles  R09.89   3. 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    Glad you are overall better and continue to get better Your heart rate is a little bit fast and can account for some of your shortness of breath Is still some residual infiltrates in the right lung along with crackles particularly in the right greater than left base Your carious teeth could have played a role in the pneumonia  Plan -Discussed with primary care about a fast heart rate -Please see your dentist for your carious teeth which could be a culprit for your pneumonia -In April 2022 do full pulmonary function test -In April 2022 to do high-resolution CT chest supine and prone -this is to rule out any underlying pulmonary fibrosis which is possible based on a chest x-ray findings and exam -  Follow-up -Return to see Dr. Chase Caller 30-minute slot in April 2022     SIGNATURE    Dr. Brand Males, M.D., F.C.C.P,  Pulmonary and Critical Care Medicine Staff Physician, [place] Director - Interstitial Lung Disease  Program  Pulmonary [place] [facility] [place], [place], [address]  Pager: [phone], If no answer or between  15:00h - 7:00h: call [phone] Telephone: [phone]  10:08 AM 02/04/2021

## 2021-02-04 NOTE — Addendum Note (Signed)
Addended by: Lorretta Harp on: 02/04/2021 10:21 AM   Modules accepted: Orders

## 2021-02-11 DIAGNOSIS — R0600 Dyspnea, unspecified: Secondary | ICD-10-CM | POA: Diagnosis not present

## 2021-02-11 DIAGNOSIS — Z20822 Contact with and (suspected) exposure to covid-19: Secondary | ICD-10-CM | POA: Diagnosis not present

## 2021-02-11 DIAGNOSIS — I482 Chronic atrial fibrillation, unspecified: Secondary | ICD-10-CM | POA: Diagnosis not present

## 2021-02-11 DIAGNOSIS — I509 Heart failure, unspecified: Secondary | ICD-10-CM | POA: Diagnosis not present

## 2021-02-11 DIAGNOSIS — I10 Essential (primary) hypertension: Secondary | ICD-10-CM | POA: Diagnosis not present

## 2021-02-12 DIAGNOSIS — L84 Corns and callosities: Secondary | ICD-10-CM | POA: Diagnosis not present

## 2021-02-12 DIAGNOSIS — E1351 Other specified diabetes mellitus with diabetic peripheral angiopathy without gangrene: Secondary | ICD-10-CM | POA: Diagnosis not present

## 2021-02-12 DIAGNOSIS — L602 Onychogryphosis: Secondary | ICD-10-CM | POA: Diagnosis not present

## 2021-02-16 DIAGNOSIS — Z20822 Contact with and (suspected) exposure to covid-19: Secondary | ICD-10-CM | POA: Diagnosis not present

## 2021-02-24 DIAGNOSIS — I482 Chronic atrial fibrillation, unspecified: Secondary | ICD-10-CM | POA: Diagnosis not present

## 2021-02-24 DIAGNOSIS — I509 Heart failure, unspecified: Secondary | ICD-10-CM | POA: Diagnosis not present

## 2021-02-27 ENCOUNTER — Ambulatory Visit: Payer: Medicare Other | Admitting: Cardiovascular Disease

## 2021-02-27 ENCOUNTER — Other Ambulatory Visit: Payer: Self-pay

## 2021-02-27 ENCOUNTER — Encounter: Payer: Self-pay | Admitting: Cardiovascular Disease

## 2021-02-27 VITALS — BP 136/64 | HR 104 | Ht 68.0 in | Wt 144.4 lb

## 2021-02-27 DIAGNOSIS — I2510 Atherosclerotic heart disease of native coronary artery without angina pectoris: Secondary | ICD-10-CM

## 2021-02-27 DIAGNOSIS — E782 Mixed hyperlipidemia: Secondary | ICD-10-CM | POA: Diagnosis not present

## 2021-02-27 DIAGNOSIS — I4819 Other persistent atrial fibrillation: Secondary | ICD-10-CM

## 2021-02-27 LAB — BASIC METABOLIC PANEL
BUN/Creatinine Ratio: 20 (ref 10–24)
BUN: 27 mg/dL (ref 8–27)
CO2: 24 mmol/L (ref 20–29)
Calcium: 9.2 mg/dL (ref 8.6–10.2)
Chloride: 96 mmol/L (ref 96–106)
Creatinine, Ser: 1.34 mg/dL — ABNORMAL HIGH (ref 0.76–1.27)
Glucose: 134 mg/dL — ABNORMAL HIGH (ref 65–99)
Potassium: 4.3 mmol/L (ref 3.5–5.2)
Sodium: 136 mmol/L (ref 134–144)
eGFR: 51 mL/min/{1.73_m2} — ABNORMAL LOW (ref >59)

## 2021-02-27 MED ORDER — FUROSEMIDE 20 MG PO TABS: ORAL_TABLET | ORAL | 3 refills | Status: DC

## 2021-02-27 NOTE — Progress Notes (Signed)
Cardiology Office Note   Date:  02/27/2021   ID:  NOLLAN MULDROW, DOB 1931-03-09, MRN [id number]  PCP:  Lavone Orn, MD  Cardiologist:   [name], MD   Chief Complaint  Patient presents with  . Coronary Artery Disease   1. CAD, s/p LAD stent 2009 2. HTN 3. Diabetes Mellitus 4. Hyperlipidemia 5. Carotid artery stenosis  - Right - mod- severe    Sherod is doing well from a cardiac standpoint. He has not had any episodes of chest pain or shortness breath. [name] been exercising on a regular basis. He had a nice garden this year.   Apr 26, 2013:  Mccauley is doing well. No CP , no dyspnea. He has put in his garden this year.   Nov. 12, 2014:  Apr 24, 2014:  Hutch is doing well. No CP , no dyspnea.  hes getting his garden going.   Nov. 9, 2015:  Chayce is doing well. His BP was elevated at his last visit. We increased his Lisinopril/HCTZ and his Bp is much better.    May 15, 2015:   CAVEN PERINE is a 87 y.o. male who presents for his CAD Doing well.   No CP or dyspnea.  Still caring for his wife who is ill.  Still very busy working out in the garden   Nov. 29, 2016:  Doing well.  No CP or dyspnea.  Still very busy taking care of his wife, [name]  She is having lots of bleeding from her bowels.   May 13, 2016:  Laurence is doing well.  Still taking care of [name]  - she has more bad days than good days .  Enjoys his garden  BP and HR are very good No CP or dyspnea  Nov. 3, 2017:  Doing well.   Worked in his garden this past summer .  BP is well controlled.  HR is slow,  But no syncopal episodes  Having more challenges with wife , [name] now.   She seems to have more dementia .  July 18. 2018  No issues, no CP .  BP and HR are well controlled.   January 18, 2018:  Doing well.   Stays busy.    He has moderate to severe right carotid artery stenosis.  Recommendation is for a follow-up in 12 months which will be November,  2019.  Sept. 4, 2019:  Tough time dealing with wife [name],   S/p recent right carotid endarectomy [name], MD )  No CP Has occasional dizziness   February 19, 2019:  Doing well .    No CP , no dyspnea.  Still worried about wife [name] ,  She has a UTI  BP is a bit elevated.  Avoids salty foods  Wt was 154 lbs in Sept, 2019,   [place] today is 148.8 lbs.   October 17, 2019: Elick is seen today for a follow-up visit.  He has a history of coronary artery disease and is status post PTCA and stenting.  He also has carotid artery disease, hypertension, dyslipidemia and diabetes mellitus. Has rare episodes of orthostasis  These episodes are not so severe that he wants to consider decreasing the dose of his lisinopril HCTZ.[name]  He denies any chest pain or shortness of breath. Wife passed away since I last saw him   Apr 23, 2020:  Garet is in atrial fib today .  He feels fine.  Cannot tell that his HR is  irreg.  No cp ,  Slight DOE with exertion compared to last year he has been able to do all of his normal activities.  He has noticed a little bit more shortness of breath when walking out the yard.   June 9 , 2021:  [name] is seen today for follow-up of his atrial fibrillation.  He was found to have atrial fibrillation about a month ago. Has stopped driving .  Slow reflexes.  He is still in AF We discussed cardioversion     Sept. 14, 2021:  Shivaan is seen today for follow up of his afib and CAD We performed a cardioversion in June, 2021 No cp, no dyspnea Is cramping in his hands and left leg  Get DOE with any activity  No angina  Still eats some processed meats. ( hot dogs, sausage)  Echocardiogram from June, 2021 reveals normal left ventricular systolic function.  We were not able to determine his diastolic parameters.  He has moderate mitral regurgitation.  Mild aortic insufficiency.  Mild tricuspid insufficiency.  Gets up every 2-3 hours to urinate at night  February 27, 2021: Jacksyn is feeling better after getting over a case of pneumonia.  He did not require hospitalization but required numerous other medications. Saw Dr. Laurann Montana,  Was treated with Abx.  Feeling better  Tested for Covid on Monday - was negative  maxzide was changed to lasix for acute CHF related to his pneumonia Is much better now     Past Medical History:  Diagnosis Date  . Acute myocardial infarction   . Carotid stenosis   . Coronary artery disease    PTCA and stenting of his right coronary artery. 3.5 x 16-mm Liberte stent.              . Diabetes mellitus without complication ([place])   . Dyslipidemia   . Hyperlipemia   . Hypertension   . Partial tear of rotator cuff    right shoulder  . Peripheral vascular disease ([place])   . Pulmonary hypertension (H[facility])    moderate pulmonary HTN by 2007 echo (RVSP 52 mmHg)  . Stroke ([place])   . Wears dentures     Past Surgical History:  Procedure Laterality Date  . CARDIAC CATHETERIZATION  11/05/2008  . CARDIAC CATHETERIZATION  10/22/1993   EF 60%  . CARDIOVERSION N/A 06/05/2020   Procedure: CARDIOVERSION;  Surgeon: Acie Fredrickson Wonda Cheng, MD;  Location: [facility] ENDOSCOPY;  Service: Cardiovascular;  Laterality: N/A;  . CAROTID ENDARTERECTOMY Right 03/23/2018  . CORONARY ANGIOPLASTY WITH STENT PLACEMENT    . ENDARTERECTOMY Right 03/23/2018   Procedure: RIGHT CAROTID ARTERY ENDARTERECTOMY;  Surgeon: Waynetta Sandy, MD;  Location: [facility];  Service: Vascular;  Laterality: Right;  . FRACTURE SURGERY     righ fibula and tibia  . HERNIA REPAIR  1984  . KNEE ARTHROSCOPY Left   . MULTIPLE TOOTH EXTRACTIONS    . PATCH ANGIOPLASTY Right 03/23/2018   Procedure: WITH XENOSURE 1CM X 6CM PATCH ANGIOPLASTY;  Surgeon: Waynetta Sandy, MD;  Location: [facility];  Service: Vascular;  Laterality: Right;  . SHOULDER ARTHROSCOPY Right 06/09/2017   Procedure: ARTHROSCOPY SHOULDER SUBACROMIAL DECOMPRESSION AND ACROMIOPLASTY;  Surgeon: Melrose Nakayama, MD;   Location: [facility];  Service: Orthopedics;  Laterality: Right;  GENERAL ANESTHESIA WITH BLOCK     Current Outpatient Medications  Medication Sig Dispense Refill  . apixaban (ELIQUIS) 5 MG TABS tablet Take 1 tablet (5 mg total) by mouth 2 (two) times daily. 60 tablet  11  . carvedilol (COREG) 12.5 MG tablet Take 12.5 mg by mouth in the morning and at bedtime.    . clopidogrel (PLAVIX) 75 MG tablet TAKE 1 TABLET BY MOUTH  DAILY 90 tablet 2  . diltiazem (CARDIZEM) 120 MG tablet Take 1 capsule by mouth daily.    . furosemide (LASIX) 20 MG tablet Take 1 tablet daily as needed 90 tablet 3  . lisinopril-hydrochlorothiazide (ZESTORETIC) 20-25 MG tablet Take 1 tablet by mouth daily.    . melatonin 3 MG TABS tablet Take 3 mg by mouth at bedtime as needed.    . metFORMIN (GLUCOPHAGE) 1000 MG tablet Take 1,000 mg by mouth 2 (two) times daily with a meal.    . nitroGLYCERIN (NITROSTAT) 0.4 MG SL tablet Place 1 tablet (0.4 mg total) under the tongue every 5 (five) minutes as needed for chest pain. 25 tablet 6  . omeprazole (PRILOSEC) 20 MG capsule 1 capsule before meal    . potassium chloride (KLOR-CON) 10 MEQ tablet Take 10 mEq by mouth daily. As needed    . rosuvastatin (CRESTOR) 10 MG tablet TAKE 1 TABLET BY MOUTH  DAILY 90 tablet 3  . tamsulosin (FLOMAX) 0.4 MG CAPS capsule Take 0.4 mg by mouth at bedtime.     . triamcinolone cream (KENALOG) 0.1 % Apply 1 application topically as needed.     No current facility-administered medications for this visit.    Allergies:   Patient has no known allergies.    Social History:  The patient  reports that he has quit smoking. His smoking use included cigarettes. He has a 5.00 pack-year smoking history. He has never used smokeless tobacco. He reports that he does not drink alcohol and does not use drugs.   Family History:  The patient's family history includes Heart attack in his father.    ROS: As per current history.  All other systems are  negative.  Physical Exam: Blood pressure 136/64, pulse (!) 104, height 5\' 8"  (1.727 m), weight 144 lb 6.4 oz (65.5 kg), SpO2 97 %.  GEN:  Well nourished, well developed in no acute distress HEENT: Normal NECK: No JVD; No carotid bruits LYMPHATICS: No lymphadenopathy CARDIAC:   Irreg, ireg. , no murmurs, rubs, gallops RESPIRATORY:  Clear to auscultation without rales, wheezing or rhonchi  ABDOMEN: Soft, non-tender, non-distended MUSCULOSKELETAL:  No edema; No deformity  SKIN: Warm and dry NEUROLOGIC:  Alert and oriented x 3    EKG:   February 27, 2021: Atrial fibrillation with rapid ventricular response.      Recent Labs: 04/22/2020: ALT 15 04/23/2020: TSH 0.904 05/21/2020: Hemoglobin 11.8; Platelets 180 02/27/2021: BUN 27; Creatinine, Ser 1.34; Potassium 4.3; Sodium 136    Lipid Panel    Component Value Date/Time   CHOL 148 04/22/2020 1052   TRIG 119 04/22/2020 1052   HDL 75 04/22/2020 1052   CHOLHDL 2.0 04/22/2020 1052   CHOLHDL 2.3 10/15/2016 1215   VLDL 34 (H) 10/15/2016 1215   LDLCALC 52 04/22/2020 1052      Wt Readings from Last 3 Encounters:  02/27/21 144 lb 6.4 oz (65.5 kg)  02/04/21 152 lb 12.8 oz (69.3 kg)  09/02/20 151 lb 9.6 oz (68.8 kg)      Other studies Reviewed: Additional studies/ records that were reviewed today include: . Review of the above records demonstrates:    ASSESSMENT AND PLAN:  1.  Atrial fib:    - stable ,  Cont eliquis .  HR is well controlled.  His atrial fibrillation rate is typically well controlled although it is a little bit high today.  I suspect this is from him just getting over pneumonia.  2.  Carotid artery stenosis: He is followed at vein and vascular specialist.  Continue current medications.  3.  Acute congestive heart failure related to his pneumonia: He developed acute congestive heart failure related to his pneumonia.  He required Lasix.  He now takes Lasix 20 mg a day as needed.  We will change the Lasix to be a as  needed.  He will also take the potassium as needed.  Dr. Laurann Montana has discontinued his triamterene HCTZ.  We will make those changes in the chart as well. He will continue the lisinopril HCTZ for now.  We will check a basic metabolic profile today.    4.   HTN -     BP is well ctonrolled.     3. Diabetes Mellitus  4. Hyperlipidemia -  Managed by primary care   5. CAD, s/p LAD stent 2009 -     No angina   6.  Pulmonary hypertension: He has moderate pulmonary hypertension.  I have encouraged him to greatly decrease his salt intake.  Some of this might be age-related.  Current medicines are reviewed at length with the patient today.  The patient does not have concerns regarding medicines.  The following changes have been made:  no change  Labs/ tests ordered today include:   Orders Placed This Encounter  Procedures  . Basic metabolic panel  . EKG 12-Lead   Disposition:       [name], MD  02/27/2021 6:06 PM    [facility] [place], [place], [place]  [address] Phone: [phone]; Fax: [phone]

## 2021-02-27 NOTE — Patient Instructions (Addendum)
Medication Instructions:  Your physician has recommended you make the following change in your medication:   STOP Triameterene-HCTZ (Maxide)  Change Lasix to 20mg  daily as needed  Change Potassium to daily as needed   *If you need a refill on your cardiac medications before your next appointment, please call your pharmacy*   Lab Work: TODAY:BMET  If you have labs (blood work) drawn today and your tests are completely normal, you will receive your results only by: [name] MyChart Message (if you have MyChart) OR . A paper copy in the mail If you have any lab test that is abnormal or we need to change your treatment, we will call you to review the results.   Testing/Procedures: none   Follow-Up: At [facility], you and your health needs are our priority.  As part of our continuing mission to provide you with exceptional heart care, we have created designated Provider Care Teams.  These Care Teams include your primary Cardiologist (physician) and Advanced Practice Providers (APPs -  Physician Assistants and Nurse Practitioners) who all work together to provide you with the care you need, when you need it.  We recommend signing up for the patient portal called "MyChart".  Sign up information is provided on this After Visit Summary.  MyChart is used to connect with patients for Virtual Visits (Telemedicine).  Patients are able to view lab/test results, encounter notes, upcoming appointments, etc.  Non-urgent messages can be sent to your provider as well.   To learn more about what you can do with MyChart, go to [email or web address].    Your next appointment:    06/03/2021 at 10:40am  The format for your next appointment:   In Person  Provider:    Dr. Acie Fredrickson

## 2021-03-05 DIAGNOSIS — I482 Chronic atrial fibrillation, unspecified: Secondary | ICD-10-CM | POA: Diagnosis not present

## 2021-03-05 DIAGNOSIS — G8929 Other chronic pain: Secondary | ICD-10-CM | POA: Diagnosis not present

## 2021-03-05 DIAGNOSIS — I252 Old myocardial infarction: Secondary | ICD-10-CM | POA: Diagnosis not present

## 2021-03-05 DIAGNOSIS — G459 Transient cerebral ischemic attack, unspecified: Secondary | ICD-10-CM | POA: Diagnosis not present

## 2021-03-05 DIAGNOSIS — I509 Heart failure, unspecified: Secondary | ICD-10-CM | POA: Diagnosis not present

## 2021-03-05 DIAGNOSIS — E7800 Pure hypercholesterolemia, unspecified: Secondary | ICD-10-CM | POA: Diagnosis not present

## 2021-03-05 DIAGNOSIS — I10 Essential (primary) hypertension: Secondary | ICD-10-CM | POA: Diagnosis not present

## 2021-03-05 DIAGNOSIS — I2510 Atherosclerotic heart disease of native coronary artery without angina pectoris: Secondary | ICD-10-CM | POA: Diagnosis not present

## 2021-03-10 ENCOUNTER — Telehealth: Payer: Self-pay | Admitting: Internal Medicine

## 2021-03-10 NOTE — Telephone Encounter (Signed)
Call from DR Rich Number his dentist. She worries doing complete teetoh extraction will result in need for dentures that can be problematic in terms of fit for [employer] . Also, $ could be an issue for patient.   She is recomending Silver diamine that will make teeth black buyt will arrest decay . This is because of his age, fraility +/- $ issue  I am supportive of this plan    SIGNATURE    Dr. Brand Males, M.D., F.C.C.P,  Pulmonary and Critical Care Medicine Staff Physician, [place] Director - Interstitial Lung Disease  Program  Pulmonary [place] at [place], [place], [address]  Pager: [phone], If no answer  OR between  19:00-7:00h: page [phone] Telephone (clinical office): [phone] Telephone (research): [phone]  6:04 PM 03/10/2021

## 2021-03-11 ENCOUNTER — Other Ambulatory Visit: Payer: Self-pay

## 2021-03-11 ENCOUNTER — Ambulatory Visit (INDEPENDENT_AMBULATORY_CARE_PROVIDER_SITE_OTHER)
Admission: RE | Admit: 2021-03-11 | Discharge: 2021-03-11 | Disposition: A | Discharge status: Home / Self Care | Payer: Medicare Other | Source: Ambulatory Visit | Attending: Internal Medicine | Admitting: Internal Medicine

## 2021-03-11 DIAGNOSIS — R0989 Other specified symptoms and signs involving the circulatory and respiratory systems: Secondary | ICD-10-CM | POA: Diagnosis not present

## 2021-03-11 DIAGNOSIS — J479 Bronchiectasis, uncomplicated: Secondary | ICD-10-CM | POA: Diagnosis not present

## 2021-03-11 DIAGNOSIS — Z8701 Personal history of pneumonia (recurrent): Secondary | ICD-10-CM | POA: Diagnosis not present

## 2021-03-11 DIAGNOSIS — S2241XA Multiple fractures of ribs, right side, initial encounter for closed fracture: Secondary | ICD-10-CM | POA: Diagnosis not present

## 2021-03-11 DIAGNOSIS — R0609 Other forms of dyspnea: Secondary | ICD-10-CM | POA: Diagnosis not present

## 2021-03-19 NOTE — Progress Notes (Signed)
Has emphysema. No ILD. No  cancer. Will give results 03/25/21 visit. No need to call

## 2021-03-21 ENCOUNTER — Inpatient Hospital Stay (HOSPITAL_COMMUNITY): Admission: RE | Admit: 2021-03-21 | Payer: Medicare Other | Source: Ambulatory Visit

## 2021-03-23 ENCOUNTER — Other Ambulatory Visit (HOSPITAL_COMMUNITY)
Admission: RE | Admit: 2021-03-23 | Discharge: 2021-03-23 | Disposition: A | Discharge status: Home / Self Care | Payer: Medicare Other | Source: Ambulatory Visit | Attending: Internal Medicine | Admitting: Internal Medicine

## 2021-03-23 DIAGNOSIS — Z01812 Encounter for preprocedural laboratory examination: Secondary | ICD-10-CM | POA: Insufficient documentation

## 2021-03-23 DIAGNOSIS — U071 COVID-19: Secondary | ICD-10-CM | POA: Diagnosis not present

## 2021-03-23 LAB — SARS CORONAVIRUS 2 (TAT 6-24 HRS): SARS Coronavirus 2: POSITIVE — AB

## 2021-03-24 ENCOUNTER — Telehealth: Payer: Self-pay | Admitting: Adult Health

## 2021-03-24 ENCOUNTER — Telehealth: Payer: Self-pay | Admitting: Internal Medicine

## 2021-03-24 ENCOUNTER — Telehealth (HOSPITAL_COMMUNITY): Payer: Self-pay

## 2021-03-24 DIAGNOSIS — Z8701 Personal history of pneumonia (recurrent): Secondary | ICD-10-CM

## 2021-03-24 DIAGNOSIS — R0989 Other specified symptoms and signs involving the circulatory and respiratory systems: Secondary | ICD-10-CM

## 2021-03-24 NOTE — Telephone Encounter (Signed)
Called to discuss with patient about COVID-19 symptoms and the use of one of the available treatments for those with mild to moderate Covid symptoms and at a high risk of hospitalization.  Pt appears to qualify for outpatient treatment due to co-morbid conditions and/or a member of an at-risk group in accordance with the FDA Emergency Use Authorization.    Unable to reach pt - LMOM to return my call.   Dracen Reigle C Rubee Vega   

## 2021-03-24 NOTE — Telephone Encounter (Signed)
Patient returned call, advised of positive result from covid test.  He was having no symptoms as this time and states he had a test a week ago and it was negative.  Advised that his visit with MR would be a telephone visit and we cancelled his PFT and would reschedule it 90 days out.  Also advised that he has been referred to covid monoclonial infusion team for consideration.  He verbalized understanding.  Nothing further needed.      Documentation

## 2021-03-24 NOTE — Telephone Encounter (Signed)
Dr. Chase Caller please advise on need for pt to have appointment with you 4/13 and if needed does it need to be a televisit. Pt will be unable to have PFTs done for 90 days after positive Covid test.

## 2021-03-24 NOTE — Telephone Encounter (Signed)
Yes please change to televisit tomorrow Yes cancel pulmonary function test tomorrow Also refer to Covid treatment center because he is 87 years old he will probably benefit from antiviral a monoclonal antibody

## 2021-03-24 NOTE — Telephone Encounter (Signed)
03/24/21 - Contacted Dr. Chase Caller - spoke to [facility] - advised of  Patient's Positive COVID19 lab results. MBM

## 2021-03-24 NOTE — Telephone Encounter (Signed)
ATC x1, left detailed message per DPR, requesting that he call the office regarding his OV with Dr. Chase Caller and his PFT tomorrow.

## 2021-03-25 ENCOUNTER — Ambulatory Visit (INDEPENDENT_AMBULATORY_CARE_PROVIDER_SITE_OTHER): Payer: Medicare Other | Admitting: Internal Medicine

## 2021-03-25 ENCOUNTER — Other Ambulatory Visit: Payer: Self-pay

## 2021-03-25 DIAGNOSIS — J439 Emphysema, unspecified: Secondary | ICD-10-CM | POA: Diagnosis not present

## 2021-03-25 DIAGNOSIS — Z8701 Personal history of pneumonia (recurrent): Secondary | ICD-10-CM | POA: Diagnosis not present

## 2021-03-25 DIAGNOSIS — U071 COVID-19: Secondary | ICD-10-CM

## 2021-03-25 MED ORDER — SPIRIVA RESPIMAT 2.5 MCG/ACT IN AERS: 2.0000 | INHALATION_SPRAY | Freq: Every day | RESPIRATORY_TRACT | 5 refills | Status: DC

## 2021-03-25 MED ORDER — SPIRIVA RESPIMAT 2.5 MCG/ACT IN AERS: 2.0000 | INHALATION_SPRAY | Freq: Every day | RESPIRATORY_TRACT | 0 refills | Status: DC

## 2021-03-25 MED ORDER — ALBUTEROL SULFATE HFA 108 (90 BASE) MCG/ACT IN AERS: 2.0000 | INHALATION_SPRAY | Freq: Four times a day (QID) | RESPIRATORY_TRACT | 6 refills | Status: AC | PRN

## 2021-03-25 NOTE — Patient Instructions (Addendum)
COVID-19 virus infection 03/23/21  -Incidental diagnose of Covid virus/11/22.  Current symptoms appears to be runny nose.  mRNA vaccine x3 doses.  But high risk candidate based on age and comorbidities  Plan -Refer to the Covid center for consideration of antivirals versus monoclonal antibody [facility] to make sure this is happened]   History of recurrent pneumonia   -No abnormalities to account for this on CT scan of the chest March 2022  Plan -Continue observation support  Pulmonary emphysema, unspecified emphysema type ([place]) Dyspnea on exertion  -There is evidence of emphysema from previous smoking on the CT scan of the chest that can account for chronic stable shortness of breath on exertion relieved by rest  Plan -Start Spiriva Respimat 2.5 strength at 2 puffs once daily  -Take 6-8 weeks of samples and also take prescription -Start albuterol as needed  Follow-up -Pulmonary function test in 3 months [after 90 days have elapsed since Covid positive with the] -Return to see Dr. Chase Caller in 3 months but after PFTs

## 2021-03-25 NOTE — Progress Notes (Signed)
Patient seen in the office today and instructed on use of Spiriva.  Patient expressed understanding and demonstrated technique. 

## 2021-03-25 NOTE — Progress Notes (Signed)
OV 02/04/2021  Subjective:  Patient ID: Todd Armstrong, male , DOB: 10-07-1931 , age 87 y.o. , MRN: [id number] , ADDRESS: [address] [place] [address] PCP Lavone Orn, MD Patient Care Team: Lavone Orn, MD as PCP - General (Internal Medicine) Nahser, Wonda Cheng, MD as PCP - Cardiology (Cardiology)  This Provider for this visit: Treatment Team:  Attending Provider: Brand Males, MD    02/04/2021 -   Chief Complaint  Patient presents with  . Consult    Pt being referred by Thayer Ohm, PA due to pneumonia.  Pt has had pneumonia off and on since January 2021. Denies any current complaints of cough, SOB, or chest tightness.     HPI Todd Armstrong 87 y.o. -referred for pneumonia.  History is gained from talking to the patient and also review of the outside medical records.  He is up-to-date with his Covid vaccine.  He tells me and it is confirmed I reviewed the chart that he started having insidious onset of shortness of breath that is got progressive associated with severe cough.  The cough persisted despite stopping his lisinopril.  The onset of this was sometime in December 2021.  It was at this time Dr. Laurann Montana stopped his lisinopril and then he thought his cough got better but by early January 2022 his cough got worse.  Then by late January 2022 he was started on PPI.  Chest x-ray at this time showed right middle and lower lobe pneumonia [documented below].  He was started on azithromycin for 5 days and Augmentin for 7 days.  When he saw a nurse practitioner on 01/19/2021 his symptoms had resolved but he was having weakness.  Chest x-ray at this time showed worsening and a referral was made [see below].  However 01/26/2021 chest x-ray showed further improvement.  I personally visualized all these chest x-rays.  At this point in time he tells me that he is actually feeling better.  He has very mild residual shortness of breath.  The cough is gone.  He denies any  aspiration or dysphagia.  Denies any vomiting.  Denies nausea.  Denies loss of consciousness.  Denies acid reflux.  Has occasional postnasal drip.  He does have dentures.  Beneath the dentures a couple maxillary carious teeth.  Has not seen the dentist in a while.  He denies any down pillows.  Denies any mold or mildew in the house denies any hot tub use.  He only has a cat in the house.  He is wondering how he got the pneumonia.  He says he was Covid negative.   Radiologic review upon personal visualization report summary below  He has had 3 chest x-rays.  The first 1 was 01/02/2021 that showed patchy areas of interstitial prominence in the right lung most concerning for right mid to lower lobe pneumonia.  It is at this point he got treated with antibiotics.  He had follow-up chest x-ray on 01/19/2021 that I also personally visualized and that showed progression.  At this point in time the referral was made.  The next chest x-ray 01/26/2021 showed improvement.  Other findings include rib deformities and also emphysema  Walking desaturation test and 85 feet x 3 laps: He only did 2 laps and stop because of shortness of breath.  Resting pulse ox 99%.  Resting heart rate 94/min.  Final pulse ox 98% and got tachycardic at 133/min.  He walked at an average pace for age.  He got limited by his tachycardia   OV 03/25/2021  Subjective:  Patient ID: Todd Armstrong, male , DOB: 04-30-1931 , age 31 y.o. , MRN: [id number] , ADDRESS: [address] [place] [address] PCP Lavone Orn, MD Patient Care Team: Lavone Orn, MD as PCP - General (Internal Medicine) Nahser, Wonda Cheng, MD as PCP - Cardiology (Cardiology)  This Provider for this visit: Treatment Team:  Attending Provider: Brand Males, MD  Type of visit: Telephone/Video Circumstance: COVID-19 national emergency Identification of patient Todd Armstrong with [birth year] and MRN [id number] - 2 person identifier Risks: Risks, benefits,  limitations of telephone visit explained. Patient understood and verbalized agreement to proceed Anyone else on call: none Patient location: [phone] This provider location: [address], [phone]   03/25/2021 -follow-up visit for recurrent pneumonia.  Now diagnosed incidentally with COVID-19   HPI Todd Armstrong 87 y.o. -face-to-face office visit changed to telephone visit.  Is because he had Covid PCR test for pulmonary function testing and this turned out to be positive.  He denies any sick contacts.  He has had 3 doses of mRNA vaccine.  However has had runny nose for 2 weeks.  His PC has been positive.  No fever or chills or sore throat or loss of taste or loss of smell.  He is reporting chronic stable shortness of breath on exertion going to the mailbox and coming back and this is been going on for over a month or 2.  He is not had any dysphagia or aspiration episodes at home.  No loss of consciousness.  No pneumonia episodes.  He had high-resolution CT chest that is reviewed below.  Shows evidence of previous pneumonia.  Also shows emphysema.  No cancer no ILD.   SARS Coronavirus 2 NEGATIVE POSITIVEAbnormal 03/23/21  NEGATIVE      CT Chest data 03/11/21 HRCT   IMPRESSION: 1. No evidence of fibrotic interstitial lung disease. Minimal dependent scarring and bronchiectasis of the right lung base, consistent with sequelae of prior infection or inflammation. 2. Mild, diffuse bilateral bronchial wall thickening, consistent with nonspecific infectious or inflammatory bronchitis. 3. Emphysema. 4. Coronary artery disease.  Aortic Atherosclerosis (ICD10-I70.0) and Emphysema (ICD10-J43.9).   Electronically Signed   By: Eddie Candle M.D.   On: 03/11/2021 18:16    has a past medical history of Acute myocardial infarction, Carotid stenosis, Coronary artery disease, Diabetes mellitus without complication ([place]), Dyslipidemia, Hyperlipemia, Hypertension, Partial tear of  rotator cuff, Peripheral vascular disease ([place]), Pulmonary hypertension ([place]), Stroke ([place]), and Wears dentures.   reports that he has quit smoking. His smoking use included cigarettes. He has a 5.00 pack-year smoking history. He has never used smokeless tobacco.  Past Surgical History:  Procedure Laterality Date  . CARDIAC CATHETERIZATION  11/05/2008  . CARDIAC CATHETERIZATION  10/22/1993   EF 60%  . CARDIOVERSION N/A 06/05/2020   Procedure: CARDIOVERSION;  Surgeon: Acie Fredrickson Wonda Cheng, MD;  [facility] END[facility]COPY;  Service: Cardiovascular;  Laterality: N/A;  . CAROTID ENDARTERECTOMY Right 03/23/2018  . CORONARY ANGIOPLASTY WITH STENT PLACEMENT    . ENDARTERECTOMY Right 03/23/2018   Procedure: RIGHT CAROTID ARTERY ENDARTERECTOMY;  Surgeon: Waynetta Sandy, MD;  Location: [facility];  Service: Vascular;  Laterality: Right;  . FRACTURE SURGERY     righ fibula and tibia  . HERNIA REPAIR  1984  . KNEE ARTHROSCOPY Left   . MULTIPLE TOOTH EXTRACTIONS    . PATCH ANGIOPLASTY Right 03/23/2018   Procedure: WITH XENOSURE 1CM  X 6CM PATCH ANGIOPLASTY;  Surgeon: Waynetta Sandy, MD;  Location: [facility];  Service: Vascular;  Laterality: Right;  . SHOULDER ARTHROSCOPY Right 06/09/2017   Procedure: ARTHROSCOPY SHOULDER SUBACROMIAL DECOMPRESSION AND ACROMIOPLASTY;  Surgeon: Melrose Nakayama, MD;  Location: [facility];  Service: Orthopedics;  Laterality: Right;  GENERAL ANESTHESIA WITH BLOCK    No Known Allergies  Immunization History  Administered Date(s) Administered  . Influenza Split 10/07/2009, 10/12/2010, 09/13/2011, 10/17/2012, 09/10/2013, 10/07/2014  . Influenza, High Dose Seasonal PF 08/05/2015, 09/21/2016, 10/13/2017, 09/12/2018, 08/22/2019, 10/31/2020  . PFIZER(Purple Top)SARS-COV-2 Vaccination 02/14/2020, 03/12/2020, 10/10/2020  . Pneumococcal Conjugate-13 01/31/2014  . Pneumococcal Polysaccharide-23 09/12/2001  . Td 12/13/2005  . Tdap 08/09/2016  . Zoster 12/08/2011    Family  History  Problem Relation Age of Onset  . Heart attack Father      Current Outpatient Medications:  .  apixaban (ELIQUIS) 5 MG TABS tablet, Take 1 tablet (5 mg total) by mouth 2 (two) times daily., Disp: 60 tablet, Rfl: 11 .  carvedilol (COREG) 12.5 MG tablet, Take 12.5 mg by mouth in the morning and at bedtime., Disp: , Rfl:  .  clopidogrel (PLAVIX) 75 MG tablet, TAKE 1 TABLET BY MOUTH  DAILY, Disp: 90 tablet, Rfl: 2 .  diltiazem (CARDIZEM) 120 MG tablet, Take 1 capsule by mouth daily., Disp: , Rfl:  .  furosemide (LASIX) 20 MG tablet, Take 1 tablet daily as needed, Disp: 90 tablet, Rfl: 3 .  lisinopril-hydrochlorothiazide (ZESTORETIC) 20-25 MG tablet, Take 1 tablet by mouth daily., Disp: , Rfl:  .  melatonin 3 MG TABS tablet, Take 3 mg by mouth at bedtime as needed., Disp: , Rfl:  .  metFORMIN (GLUCOPHAGE) 1000 MG tablet, Take 1,000 mg by mouth 2 (two) times daily with a meal., Disp: , Rfl:  .  nitroGLYCERIN (NITROSTAT) 0.4 MG SL tablet, Place 1 tablet (0.4 mg total) under the tongue every 5 (five) minutes as needed for chest pain., Disp: 25 tablet, Rfl: 6 .  omeprazole (PRILOSEC) 20 MG capsule, 1 capsule before meal, Disp: , Rfl:  .  potassium chloride (KLOR-CON) 10 MEQ tablet, Take 10 mEq by mouth daily. As needed, Disp: , Rfl:  .  rosuvastatin (CRESTOR) 10 MG tablet, TAKE 1 TABLET BY MOUTH  DAILY, Disp: 90 tablet, Rfl: 3 .  tamsulosin (FLOMAX) 0.4 MG CAPS capsule, Take 0.4 mg by mouth at bedtime. , Disp: , Rfl:  .  triamcinolone cream (KENALOG) 0.1 %, Apply 1 application topically as needed., Disp: , Rfl:       Objective:   There were no vitals filed for this visit.  Estimated body mass index is 21.96 kg/m as calculated from the following:   Height as of 02/27/21: 5\' 8"  (1.727 m).   Weight as of 02/27/21: 144 lb 6.4 oz (65.5 kg).  @WEIGHTCHANGE @  There were no vitals filed for this visit.   Physical Exam Sounded normal on the telephone       Assessment:       ICD-10-CM    1. COVID-19 virus infection  U07.1   2. History of pneumonia  Z87.01   3. Pulmonary emphysema, unspecified emphysema type ([place])  J43.9        Plan:     Patient Instructions  COVID-19 virus infection 03/23/21  -Incidental diagnose of Covid virus/11/22.  Current symptoms appears to be runny nose.  mRNA vaccine x3 doses.  But high risk candidate based on age and comorbidities  Plan -Refer to the Covid center for consideration  of antivirals versus monoclonal antibody [CMA to make sure this is happened]   History of recurrent pneumonia   -No abnormalities to account for this on CT scan of the chest March 2022  Plan -Continue observation support  Pulmonary emphysema, unspecified emphysema type ([place]) Dyspnea on exertion  -There is evidence of emphysema from previous smoking on the CT scan of the chest that can account for chronic stable shortness of breath on exertion relieved by rest  Plan -Start Spiriva Respimat 2.5 strength at 2 puffs once daily  -Take 6-8 weeks of samples and also take prescription -Start albuterol as needed  Follow-up -Pulmonary function test in 3 months [after 90 days have elapsed since Covid positive with the] -Return to see Dr. Chase Caller in 3 months but after PFTs      (Telephone visit - Level 02 visit: Estb 11-20 for this visit type which was visit type: telephone visit in total care time and counseling or/and coordination of care by this undersigned MD - Dr Brand Males. This includes one or more of the following for care delivered on 03/25/2021 same day: pre-charting, chart review, note writing, documentation discussion of test results, diagnostic or treatment recommendations, prognosis, risks and benefits of management options, instructions, education, compliance or risk-factor reduction. It excludes time spent by the [place] or office staff in the care of the patient. Actual time was 14 min. E&M code is [phone])     SIGNATURE    Dr. Brand Males, M.D., F.C.C.P,  Pulmonary and Critical Care Medicine Staff Physician, [place] Director - Interstitial Lung Disease  Program  [facility] [place] [facility] [place], [place], [address]  Pager: [phone], If no answer or between  15:00h - 7:00h: call [phone] Telephone: [phone]  11:57 AM 03/25/2021

## 2021-03-31 ENCOUNTER — Telehealth: Payer: Self-pay | Admitting: Internal Medicine

## 2021-03-31 MED ORDER — PREDNISONE 10 MG PO TABS: ORAL_TABLET | ORAL | 0 refills | Status: AC

## 2021-03-31 MED ORDER — AZITHROMYCIN 250 MG PO TABS: ORAL_TABLET | ORAL | 0 refills | Status: DC

## 2021-03-31 NOTE — Telephone Encounter (Signed)
Spoke with pt, c/o increasing SOB with exertion, wheezing with exertion, fatigue X3 days. Pt denies fever, mucus production, chills.  Pt tested positive for C19 on 4/11, was referred by MR on 4/13 for monoclonal antibodies and prescribed Spiriva and Albuterol.  Pt picked up Spiriva and has been taking 2 puffs QAM. Pt did not pick up albuterol. Per chart pharmacy confirmed rx on 4/13 when it was sent.  Pt also states that he was contacted by the hospital regarding monoclonal antibody treatment, and was advised to call back if his cough got worse or if he developed a fever. I advised pt to call back to the hospital and arrange this treatment. Pt expressed understanding.   Pt requesting any additional recs from MR. States he wishes to see if we are calling him in any other meds before going to pick up his albuterol rx.  Pharmacy: [facility]  MR please advise, thanks!

## 2021-03-31 NOTE — Telephone Encounter (Signed)
Called and spoke with pt letting him know the recs stated by MR. Pt was given the phone number by Caryl Pina, CMA for the monoclonal hotline and I stated to him to call that number to see if he could get an appt scheduled for treatment and he verbalized understanding. Nothing further needed.

## 2021-03-31 NOTE — Telephone Encounter (Signed)
He is probably having a COPD exacerbation from COVID-19  Plan - Please call the COVID hotline and they need to consider monoclonal antibody or antiviral because of his age and medical problems he is a high risk candidate  -Meanwhile we can do conventional COPD exacerbation treatment as follows  - Please take prednisone 40 mg x1 day, then 30 mg x1 day, then 20 mg x1 day, then 10 mg x1 day, and then 5 mg x1 day and stop  - Zpak  -He needs to monitor his oxygen level and if it goes low he needs to go to the ER   No Known Allergies

## 2021-04-01 ENCOUNTER — Telehealth: Payer: Self-pay | Admitting: Internal Medicine

## 2021-04-01 ENCOUNTER — Telehealth: Payer: Self-pay | Admitting: Physician Assistant

## 2021-04-01 NOTE — Telephone Encounter (Signed)
Spoke with the pt  He is calling just to let MR know that he is not able to get the MAB b/c it's been too many days since he tested pos (4/11) He states he started pred and zpack and is tolerating these well  He still has minimal SOB and wheezing but this is improving  Made sure he has albuterol on hand if needed  I advised will let MR know unable to do MAB and advised we will call if has any other recs, but follow plan outlined yesterday

## 2021-04-01 NOTE — Telephone Encounter (Signed)
Called to discuss with patient about Covid symptoms and the use of a monoclonal antibody infusion for those with mild to moderate Covid symptoms and at a high risk of hospitalization.   Pt is not qualified for the monoclonal antibody infusion as symptoms onset was 03/23/21. Symptoms tier reviewed as well as criteria for ending isolation. Preventative practices reviewed. Patient verbalized understanding.  [place], [place]  04/01/2021 9:34 AM

## 2021-04-02 NOTE — Telephone Encounter (Signed)
Sounds good and glad he is getting better.  We will close the message

## 2021-04-13 ENCOUNTER — Other Ambulatory Visit: Payer: Self-pay | Admitting: Cardiovascular Disease

## 2021-04-14 DIAGNOSIS — I509 Heart failure, unspecified: Secondary | ICD-10-CM | POA: Diagnosis not present

## 2021-04-15 ENCOUNTER — Other Ambulatory Visit (HOSPITAL_COMMUNITY): Payer: Self-pay | Admitting: Internal Medicine

## 2021-04-15 DIAGNOSIS — I509 Heart failure, unspecified: Secondary | ICD-10-CM

## 2021-04-21 ENCOUNTER — Other Ambulatory Visit: Payer: Self-pay

## 2021-04-21 MED ORDER — SPIRIVA RESPIMAT 2.5 MCG/ACT IN AERS: 2.0000 | INHALATION_SPRAY | Freq: Every day | RESPIRATORY_TRACT | 5 refills | Status: DC

## 2021-04-23 DIAGNOSIS — I739 Peripheral vascular disease, unspecified: Secondary | ICD-10-CM | POA: Diagnosis not present

## 2021-04-23 DIAGNOSIS — L602 Onychogryphosis: Secondary | ICD-10-CM | POA: Diagnosis not present

## 2021-04-23 DIAGNOSIS — M21612 Bunion of left foot: Secondary | ICD-10-CM | POA: Diagnosis not present

## 2021-04-23 DIAGNOSIS — Z5181 Encounter for therapeutic drug level monitoring: Secondary | ICD-10-CM | POA: Diagnosis not present

## 2021-04-23 DIAGNOSIS — I509 Heart failure, unspecified: Secondary | ICD-10-CM | POA: Diagnosis not present

## 2021-04-23 DIAGNOSIS — M21611 Bunion of right foot: Secondary | ICD-10-CM | POA: Diagnosis not present

## 2021-04-23 DIAGNOSIS — M2041 Other hammer toe(s) (acquired), right foot: Secondary | ICD-10-CM | POA: Diagnosis not present

## 2021-04-23 DIAGNOSIS — L84 Corns and callosities: Secondary | ICD-10-CM | POA: Diagnosis not present

## 2021-04-28 DIAGNOSIS — G459 Transient cerebral ischemic attack, unspecified: Secondary | ICD-10-CM | POA: Diagnosis not present

## 2021-04-28 DIAGNOSIS — I2510 Atherosclerotic heart disease of native coronary artery without angina pectoris: Secondary | ICD-10-CM | POA: Diagnosis not present

## 2021-04-28 DIAGNOSIS — G8929 Other chronic pain: Secondary | ICD-10-CM | POA: Diagnosis not present

## 2021-04-28 DIAGNOSIS — I10 Essential (primary) hypertension: Secondary | ICD-10-CM | POA: Diagnosis not present

## 2021-04-28 DIAGNOSIS — E0869 Diabetes mellitus due to underlying condition with other specified complication: Secondary | ICD-10-CM | POA: Diagnosis not present

## 2021-04-28 DIAGNOSIS — I252 Old myocardial infarction: Secondary | ICD-10-CM | POA: Diagnosis not present

## 2021-04-28 DIAGNOSIS — I482 Chronic atrial fibrillation, unspecified: Secondary | ICD-10-CM | POA: Diagnosis not present

## 2021-04-28 DIAGNOSIS — E7800 Pure hypercholesterolemia, unspecified: Secondary | ICD-10-CM | POA: Diagnosis not present

## 2021-04-28 DIAGNOSIS — I509 Heart failure, unspecified: Secondary | ICD-10-CM | POA: Diagnosis not present

## 2021-05-08 ENCOUNTER — Other Ambulatory Visit: Payer: Self-pay

## 2021-05-08 ENCOUNTER — Ambulatory Visit (HOSPITAL_COMMUNITY): Payer: Medicare Other | Attending: Cardiology

## 2021-05-08 DIAGNOSIS — I509 Heart failure, unspecified: Secondary | ICD-10-CM | POA: Insufficient documentation

## 2021-05-08 LAB — ECHOCARDIOGRAM COMPLETE
Area-P 1/2: 3 cm2
MV M vel: 5.04 m/s
MV Peak grad: 101.6 mmHg
P 1/2 time: 425 msec
S' Lateral: 4.5 cm

## 2021-05-16 ENCOUNTER — Other Ambulatory Visit: Payer: Self-pay

## 2021-05-16 DIAGNOSIS — I6523 Occlusion and stenosis of bilateral carotid arteries: Secondary | ICD-10-CM

## 2021-05-18 DIAGNOSIS — Z85828 Personal history of other malignant neoplasm of skin: Secondary | ICD-10-CM | POA: Diagnosis not present

## 2021-05-18 DIAGNOSIS — D0462 Carcinoma in situ of skin of left upper limb, including shoulder: Secondary | ICD-10-CM | POA: Diagnosis not present

## 2021-05-18 DIAGNOSIS — L821 Other seborrheic keratosis: Secondary | ICD-10-CM | POA: Diagnosis not present

## 2021-05-18 DIAGNOSIS — L570 Actinic keratosis: Secondary | ICD-10-CM | POA: Diagnosis not present

## 2021-05-19 DIAGNOSIS — M21612 Bunion of left foot: Secondary | ICD-10-CM | POA: Diagnosis not present

## 2021-05-19 DIAGNOSIS — E1351 Other specified diabetes mellitus with diabetic peripheral angiopathy without gangrene: Secondary | ICD-10-CM | POA: Diagnosis not present

## 2021-05-19 DIAGNOSIS — M21611 Bunion of right foot: Secondary | ICD-10-CM | POA: Diagnosis not present

## 2021-06-02 ENCOUNTER — Encounter: Payer: Self-pay | Admitting: Cardiovascular Disease

## 2021-06-02 NOTE — Progress Notes (Signed)
Cardiology Office Note   Date:  06/03/2021   ID:  Todd Armstrong, DOB 1931/06/15, MRN [id number]  PCP:  Lavone Orn, MD  Cardiologist:   [name], MD   Chief Complaint  Patient presents with   Coronary Artery Disease   1. CAD, s/p LAD stent 2009 2. HTN 3. Diabetes Mellitus 4. Hyperlipidemia 5. Carotid artery stenosis  - Right - mod- severe    Todd Armstrong is doing well from a cardiac standpoint. He has not had any episodes of chest pain or shortness breath. [name] been exercising on a regular basis.  He had a nice garden this year.    Apr 26, 2013:  Todd Armstrong is doing well.  No CP , no dyspnea.   He has put in his garden this year.    Nov. 12, 2014:  Apr 24, 2014:  Todd Armstrong is doing well.   No CP , no dyspnea.    hes getting his garden going.     Nov. 9, 2015:  Todd Armstrong is doing well.  His BP was elevated at his last visit.  We increased his Lisinopril/HCTZ and his Bp is much better.    May 15, 2015:   Todd Armstrong is a 87 y.o. male who presents for his CAD Doing well.   No CP or dyspnea.  Still caring for his wife who is ill.  Still very busy working out in the garden   Nov. 29, 2016:  Doing well.  No CP or dyspnea.  Still very busy taking care of his wife, [name]  She is having lots of bleeding from her bowels.   May 13, 2016:  Todd Armstrong is doing well.  Still taking care of [name]  - she has more bad days than good days .  Enjoys his garden  BP and HR are very good No CP or dyspnea  Nov. 3, 2017:  Doing well.   Worked in his garden this past summer .  BP is well controlled.  HR is slow,  But no syncopal episodes  Having more challenges with wife , [name] now.   She seems to have more dementia .  July 18. 2018  No issues, no CP .  BP and HR are well controlled.   January 18, 2018:  Doing well.   Stays busy.    He has moderate to severe right carotid artery stenosis.  Recommendation is for a follow-up in 12 months which will be November,  2019.  Sept. 4, 2019:  Tough time dealing with wife [name],   S/p recent right carotid endarectomy [name], MD )  No CP Has occasional dizziness   February 19, 2019:  Doing well .    No CP , no dyspnea.  Still worried about wife [name] ,  She has a UTI  BP is a bit elevated.  Avoids salty foods  Wt was 154 lbs in Sept, 2019,   [place] today is 148.8 lbs.   October 17, 2019: Todd Armstrong is seen today for a follow-up visit.  He has a history of coronary artery disease and is status post PTCA and stenting.  He also has carotid artery disease, hypertension, dyslipidemia and diabetes mellitus. Has rare episodes of orthostasis  These episodes are not so severe that he wants to consider decreasing the dose of his lisinopril HCTZ.[name]  He denies any chest pain or shortness of breath. Wife passed away since I last saw him   Apr 23, 2020:  Todd Armstrong is in atrial  fib today .  He feels fine.  Cannot tell that his HR is irreg.  No cp ,  Slight DOE with exertion compared to last year he has been able to do all of his normal activities.  He has noticed a little bit more shortness of breath when walking out the yard.   June 9 , 2021:  Todd Armstrong is seen today for follow-up of his atrial fibrillation.  He was found to have atrial fibrillation about a month ago. Has stopped driving .  Slow reflexes.  He is still in AF We discussed cardioversion     Sept. 14, 2021:  Todd Armstrong is seen today for follow up of his afib and CAD We performed a cardioversion in June, 2021 No cp, no dyspnea Is cramping in his hands and left leg  Get DOE with any activity  No angina  Still eats some processed meats. ( hot dogs, sausage)  Echocardiogram from June, 2021 reveals normal left ventricular systolic function.  We were not able to determine his diastolic parameters.  He has moderate mitral regurgitation.  Mild aortic insufficiency.  Mild tricuspid insufficiency.  Gets up every 2-3 hours to urinate at night  February 27, 2021: Todd Armstrong is feeling better after getting over a case of pneumonia.  He did not require hospitalization but required numerous other medications. Saw Dr. Laurann Montana,  Was treated with Abx.  Feeling better  Tested for Covid on Monday - was negative  maxzide was changed to lasix for acute CHF related to his pneumonia Is much better now   June 03, 2021: Todd Armstrong is seen for follow up of his CAD, HYTN , pulmonary htn Atrial fib No CP,  just very short of breath with any walking .  Follows with pulmonary  Echo shows EF 50-55%. Moderate pulmonary HTN    Past Medical History:  Diagnosis Date   Acute myocardial infarction    Carotid stenosis    Coronary artery disease    PTCA and stenting of his right coronary artery. 3.5 x 16-mm Liberte stent.               Diabetes mellitus without complication ([place])    Dyslipidemia    Hyperlipemia    Hypertension    Partial tear of rotator cuff    right shoulder   Peripheral vascular disease ([place])    Pulmonary hypertension (HCC)    moderate pulmonary HTN by 2007 echo (RVSP 52 mmHg)   Stroke [facility])    Wears dentures     Past Surgical History:  Procedure Laterality Date   CARDIAC CATHETERIZATION  11/05/2008   CARDIAC CATHETERIZATION  10/22/1993   EF 60%   CARDIOVERSION N/A 06/05/2020   Procedure: CARDIOVERSION;  Surgeon: Thayer Headings, MD;  Location: [facility] ENDOSCOPY;  Service: Cardiovascular;  Laterality: N/A;   CAROTID ENDARTERECTOMY Right 03/23/2018   CORONARY ANGIOPLASTY WITH STENT PLACEMENT     ENDARTERECTOMY Right 03/23/2018   Procedure: RIGHT CAROTID ARTERY ENDARTERECTOMY;  Surgeon: Waynetta Sandy, MD;  Location: [place];  Service: Vascular;  Laterality: Right;   FRACTURE SURGERY     righ fibula and tibia   HERNIA REPAIR  1984   KNEE ARTHROSCOPY Left    MULTIPLE TOOTH EXTRACTIONS     PATCH ANGIOPLASTY Right 03/23/2018   Procedure: WITH Rueben Bash 1CM X 6CM PATCH ANGIOPLASTY;  Surgeon: Waynetta Sandy, MD;  Location: [place];  Service: Vascular;  Laterality: Right;   SHOULDER ARTHROSCOPY Right 06/09/2017   Procedure: ARTHROSCOPY SHOULDER SUBACROMIAL  DECOMPRESSION AND ACROMIOPLASTY;  Surgeon: Melrose Nakayama, MD;  Location: [employer];  Service: Orthopedics;  Laterality: Right;  GENERAL ANESTHESIA WITH BLOCK     Current Outpatient Medications  Medication Sig Dispense Refill   albuterol (VENTOLIN HFA) 108 (90 Base) MCG/ACT inhaler Inhale 2 puffs into the lungs every 6 (six) hours as needed for wheezing or shortness of breath. 8 g 6   apixaban (ELIQUIS) 5 MG TABS tablet Take 1 tablet (5 mg total) by mouth 2 (two) times daily. 180 tablet 1   azithromycin (ZITHROMAX) 250 MG tablet Take two today and then one daily until finished. 6 tablet 0   carvedilol (COREG) 12.5 MG tablet TAKE 1 TABLET BY MOUTH  TWICE DAILY 180 tablet 3   clopidogrel (PLAVIX) 75 MG tablet TAKE 1 TABLET BY MOUTH  DAILY 90 tablet 2   diltiazem (CARDIZEM) 120 MG tablet Take 1 capsule by mouth daily.     furosemide (LASIX) 40 MG tablet Take 40 mg by mouth daily.     lisinopril-hydrochlorothiazide (ZESTORETIC) 20-25 MG tablet Take 1 tablet by mouth daily.     metFORMIN (GLUCOPHAGE) 1000 MG tablet Take 1,000 mg by mouth 2 (two) times daily with a meal.     nitroGLYCERIN (NITROSTAT) 0.4 MG SL tablet Place 1 tablet (0.4 mg total) under the tongue every 5 (five) minutes as needed for chest pain. 25 tablet 6   potassium chloride (KLOR-CON) 10 MEQ tablet Take 10 mEq by mouth daily. As needed     rosuvastatin (CRESTOR) 10 MG tablet TAKE 1 TABLET BY MOUTH  DAILY 90 tablet 3   tamsulosin (FLOMAX) 0.4 MG CAPS capsule Take 0.4 mg by mouth at bedtime.      Tiotropium Bromide Monohydrate (SPIRIVA RESPIMAT) 2.5 MCG/ACT AERS Inhale 2 puffs into the lungs daily. 4 g 0   Tiotropium Bromide Monohydrate (SPIRIVA RESPIMAT) 2.5 MCG/ACT AERS Inhale 2 puffs into the lungs daily. 4 g 5   triamcinolone cream (KENALOG) 0.1 % Apply 1 application topically as needed.     No current  facility-administered medications for this visit.    Allergies:   Patient has no known allergies.    Social History:  The patient  reports that he has quit smoking. His smoking use included cigarettes. He has a 5.00 pack-year smoking history. He has never used smokeless tobacco. He reports that he does not drink alcohol and does not use drugs.   Family History:  The patient's family history includes Heart attack in his father.    ROS: As per current history.  All other systems are negative.  Physical Exam: Blood pressure 130/72, pulse 100, height 5\' 8"  (1.727 m), weight 151 lb 3.2 oz (68.6 kg), SpO2 97 %.  GEN:  elderly , frail man , mild dyspnea with any activity  HEENT: Normal NECK: No JVD; No carotid bruits LYMPHATICS: No lymphadenopathy CARDIAC: RRR , no murmurs, rubs, gallops RESPIRATORY:  Clear to auscultation without rales, wheezing or rhonchi  ABDOMEN: Soft, non-tender, non-distended MUSCULOSKELETAL:   1-2 + edema in left lower leg  SKIN: Warm and dry NEUROLOGIC:  Alert and oriented x 3     EKG:        Recent Labs: 02/27/2021: BUN 27; Creatinine, Ser 1.34; Potassium 4.3; Sodium 136    Lipid Panel    Component Value Date/Time   CHOL 148 04/22/2020 1052   TRIG 119 04/22/2020 1052   HDL 75 04/22/2020 1052   CHOLHDL 2.0 04/22/2020 1052   CHOLHDL 2.3 10/15/2016 1215  VLDL 34 (H) 10/15/2016 1215   LDLCALC 52 04/22/2020 1052      Wt Readings from Last 3 Encounters:  06/03/21 151 lb 3.2 oz (68.6 kg)  02/27/21 144 lb 6.4 oz (65.5 kg)  02/04/21 152 lb 12.8 oz (69.3 kg)      Other studies Reviewed: Additional studies/ records that were reviewed today include: . Review of the above records demonstrates:    ASSESSMENT AND PLAN:  1.  Atrial fib:    - remains in Afib . On eliquis    2.  Carotid artery stenosis: followed by Dr. Scot Dock , He needs to make an appt.    3.  Acute congestive heart failure related to his pneumonia:      4.   HTN -     well controlled.     3. Diabetes Mellitus  4. Hyperlipidemia - check lipids today , continue statin    5. CAD, s/p LAD stent 2009 -     cont plavix  No angina     6.  Pulmonary hypertension:  Seeing pulmonary  Has left leg edema  Advised leg elevation , compression hose  Is on lasix. Is suspect this is mostly due to his COPD.     Current medicines are reviewed at length with the patient today.  The patient does not have concerns regarding medicines.  The following changes have been made:  no change  Labs/ tests ordered today include:   Orders Placed This Encounter  Procedures   Basic metabolic panel   Lipid panel    Disposition:       [name], MD  06/03/2021 1:53 PM    [facility] [place], [place], [place]  [address] Phone: [phone]; Fax: [phone]

## 2021-06-03 ENCOUNTER — Encounter: Payer: Self-pay | Admitting: Cardiovascular Disease

## 2021-06-03 ENCOUNTER — Other Ambulatory Visit: Payer: Self-pay

## 2021-06-03 ENCOUNTER — Other Ambulatory Visit: Payer: Self-pay | Admitting: *Deleted

## 2021-06-03 ENCOUNTER — Ambulatory Visit: Payer: Medicare Other | Admitting: Cardiovascular Disease

## 2021-06-03 VITALS — BP 130/72 | HR 100 | Ht 68.0 in | Wt 151.2 lb

## 2021-06-03 DIAGNOSIS — I272 Pulmonary hypertension, unspecified: Secondary | ICD-10-CM | POA: Diagnosis not present

## 2021-06-03 DIAGNOSIS — I2510 Atherosclerotic heart disease of native coronary artery without angina pectoris: Secondary | ICD-10-CM | POA: Diagnosis not present

## 2021-06-03 DIAGNOSIS — I4819 Other persistent atrial fibrillation: Secondary | ICD-10-CM

## 2021-06-03 DIAGNOSIS — R0989 Other specified symptoms and signs involving the circulatory and respiratory systems: Secondary | ICD-10-CM | POA: Diagnosis not present

## 2021-06-03 LAB — BASIC METABOLIC PANEL
BUN/Creatinine Ratio: 15 (ref 10–24)
BUN: 21 mg/dL (ref 8–27)
CO2: 25 mmol/L (ref 20–29)
Calcium: 9.2 mg/dL (ref 8.6–10.2)
Chloride: 96 mmol/L (ref 96–106)
Creatinine, Ser: 1.42 mg/dL — ABNORMAL HIGH (ref 0.76–1.27)
Glucose: 133 mg/dL — ABNORMAL HIGH (ref 65–99)
Potassium: 3.6 mmol/L (ref 3.5–5.2)
Sodium: 139 mmol/L (ref 134–144)
eGFR: 48 mL/min/{1.73_m2} — ABNORMAL LOW (ref >59)

## 2021-06-03 LAB — LIPID PANEL
Chol/HDL Ratio: 1.7 ratio (ref 0.0–5.0)
Cholesterol, Total: 131 mg/dL (ref 100–199)
HDL: 79 mg/dL (ref >39)
LDL Chol Calc (NIH): 38 mg/dL (ref 0–99)
Triglycerides: 72 mg/dL (ref 0–149)
VLDL Cholesterol Cal: 14 mg/dL (ref 5–40)

## 2021-06-03 MED ORDER — ELIQUIS 5 MG PO TABS: 5.0000 mg | ORAL_TABLET | Freq: Two times a day (BID) | ORAL | 1 refills | Status: DC

## 2021-06-03 NOTE — Telephone Encounter (Signed)
Eliquis 5mg  paper refill request received. Patient is 87 years old, weight-65.5kg, Crea- 1.34 on 02/27/21 , Diagnosis-Afib, and last seen by Dr. Acie Fredrickson on 02/27/2021. Dose is appropriate based on dosing criteria. Will send in refill to requested pharmacy.

## 2021-06-03 NOTE — Patient Instructions (Signed)
   Medication Instructions:  Your physician recommends that you continue on your current medications as directed. Please refer to the Current Medication list given to you today.  *If you need a refill on your cardiac medications before your next appointment, please call your pharmacy*   Lab Work: TODAY: Lipids, BMET If you have labs (blood work) drawn today and your tests are completely normal, you will receive your results only by: [place] (if you have MyChart) OR A paper copy in the mail If you have any lab test that is abnormal or we need to change your treatment, we will call you to review the results.   Testing/Procedures: none   Follow-Up: At [facility], you and your health needs are our priority.  As part of our continuing mission to provide you with exceptional heart care, we have created designated Provider Care Teams.  These Care Teams include your primary Cardiologist (physician) and Advanced Practice Providers (APPs -  Physician Assistants and Nurse Practitioners) who all work together to provide you with the care you need, when you need it.  We recommend signing up for the patient portal called "MyChart".  Sign up information is provided on this After Visit Summary.  MyChart is used to connect with patients for Virtual Visits (Telemedicine).  Patients are able to view lab/test results, encounter notes, upcoming appointments, etc.  Non-urgent messages can be sent to your provider as well.   To learn more about what you can do with MyChart, go to [email or web address].    Your next appointment:   6 month(s)  The format for your next appointment:   In Person  Provider:   You may see [name], MD or one of the following Advanced Practice Providers on your designated Care Team:   Richardson Dopp, PA-C [place], [place]

## 2021-06-04 DIAGNOSIS — I482 Chronic atrial fibrillation, unspecified: Secondary | ICD-10-CM | POA: Diagnosis not present

## 2021-06-04 DIAGNOSIS — E1169 Type 2 diabetes mellitus with other specified complication: Secondary | ICD-10-CM | POA: Diagnosis not present

## 2021-06-04 DIAGNOSIS — I2510 Atherosclerotic heart disease of native coronary artery without angina pectoris: Secondary | ICD-10-CM | POA: Diagnosis not present

## 2021-06-04 DIAGNOSIS — R0600 Dyspnea, unspecified: Secondary | ICD-10-CM | POA: Diagnosis not present

## 2021-06-04 DIAGNOSIS — I5032 Chronic diastolic (congestive) heart failure: Secondary | ICD-10-CM | POA: Diagnosis not present

## 2021-06-04 DIAGNOSIS — I10 Essential (primary) hypertension: Secondary | ICD-10-CM | POA: Diagnosis not present

## 2021-06-04 DIAGNOSIS — D649 Anemia, unspecified: Secondary | ICD-10-CM | POA: Diagnosis not present

## 2021-06-05 IMAGING — DX DG CHEST 2V
2 series · 2 of 2 positions shown · non-contrast
Comparison: 01/02/2021

CLINICAL DATA: Pneumonia right lower lobe

EXAM:
CHEST - 2 VIEW

[dg chest 2 view (1 of 2)]
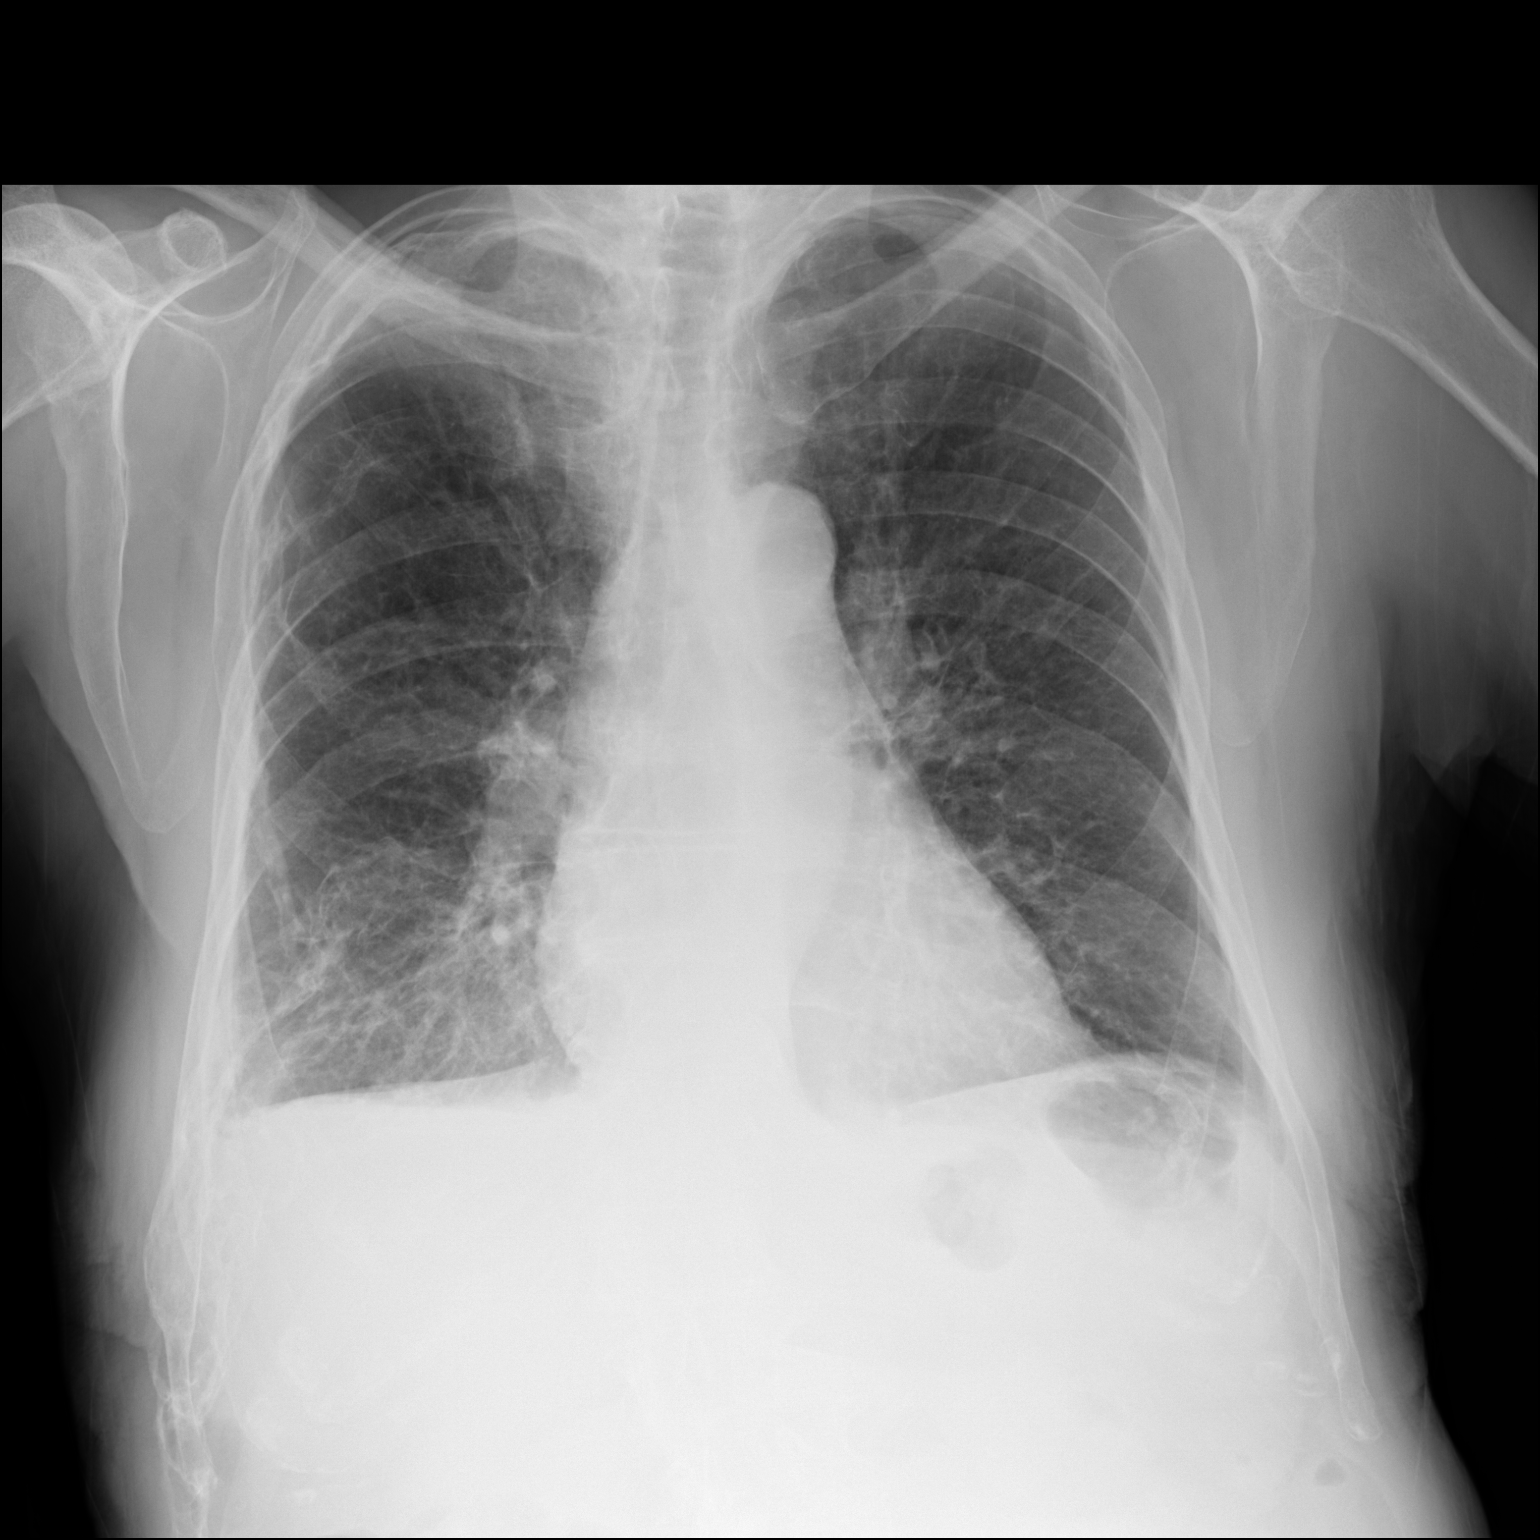

[dg chest 2 view (2 of 2)]
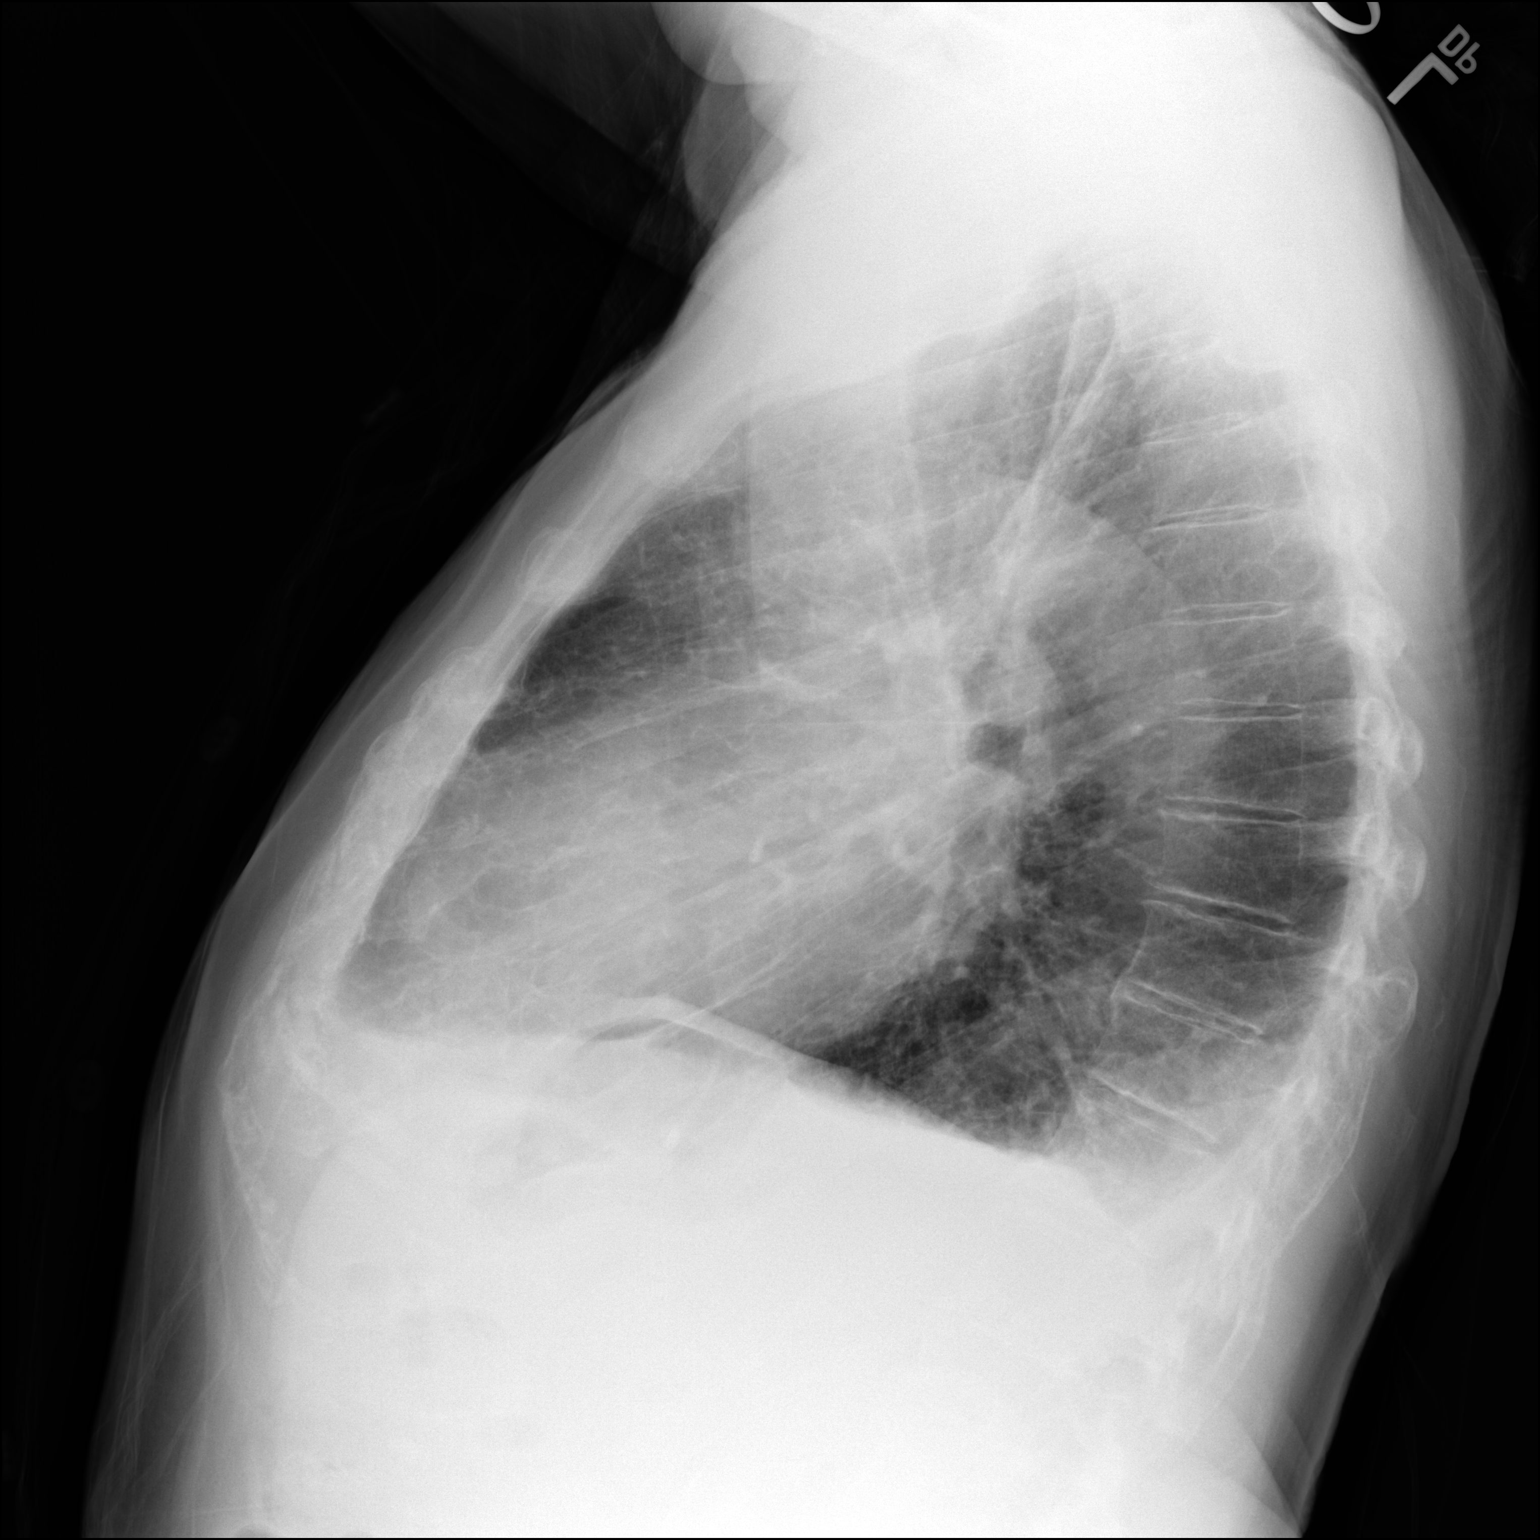

[2 of 2 positions shown; findings below may reference images not displayed]

FINDINGS: Progression of right lower lobe infiltrate with small right
effusion.

Left lung clear.  Heart size and vascularity normal.

Chronic right-sided rib fractures.
IMPRESSION: Progression of right lower lobe infiltrate and small right effusion

## 2021-06-17 DIAGNOSIS — I5032 Chronic diastolic (congestive) heart failure: Secondary | ICD-10-CM | POA: Diagnosis not present

## 2021-06-17 DIAGNOSIS — I482 Chronic atrial fibrillation, unspecified: Secondary | ICD-10-CM | POA: Diagnosis not present

## 2021-06-17 DIAGNOSIS — D539 Nutritional anemia, unspecified: Secondary | ICD-10-CM | POA: Diagnosis not present

## 2021-07-14 DIAGNOSIS — L603 Nail dystrophy: Secondary | ICD-10-CM | POA: Diagnosis not present

## 2021-07-14 DIAGNOSIS — E1151 Type 2 diabetes mellitus with diabetic peripheral angiopathy without gangrene: Secondary | ICD-10-CM | POA: Diagnosis not present

## 2021-07-14 DIAGNOSIS — B351 Tinea unguium: Secondary | ICD-10-CM | POA: Diagnosis not present

## 2021-07-15 DIAGNOSIS — I5032 Chronic diastolic (congestive) heart failure: Secondary | ICD-10-CM | POA: Diagnosis not present

## 2021-07-15 DIAGNOSIS — G8929 Other chronic pain: Secondary | ICD-10-CM | POA: Diagnosis not present

## 2021-07-15 DIAGNOSIS — I509 Heart failure, unspecified: Secondary | ICD-10-CM | POA: Diagnosis not present

## 2021-07-15 DIAGNOSIS — I252 Old myocardial infarction: Secondary | ICD-10-CM | POA: Diagnosis not present

## 2021-07-15 DIAGNOSIS — G459 Transient cerebral ischemic attack, unspecified: Secondary | ICD-10-CM | POA: Diagnosis not present

## 2021-07-15 DIAGNOSIS — I2510 Atherosclerotic heart disease of native coronary artery without angina pectoris: Secondary | ICD-10-CM | POA: Diagnosis not present

## 2021-07-15 DIAGNOSIS — I482 Chronic atrial fibrillation, unspecified: Secondary | ICD-10-CM | POA: Diagnosis not present

## 2021-07-15 DIAGNOSIS — I10 Essential (primary) hypertension: Secondary | ICD-10-CM | POA: Diagnosis not present

## 2021-07-15 DIAGNOSIS — E7800 Pure hypercholesterolemia, unspecified: Secondary | ICD-10-CM | POA: Diagnosis not present

## 2021-07-15 DIAGNOSIS — E0869 Diabetes mellitus due to underlying condition with other specified complication: Secondary | ICD-10-CM | POA: Diagnosis not present

## 2021-07-27 ENCOUNTER — Encounter: Payer: Self-pay | Admitting: Internal Medicine

## 2021-07-27 ENCOUNTER — Other Ambulatory Visit: Payer: Self-pay

## 2021-07-27 ENCOUNTER — Ambulatory Visit (INDEPENDENT_AMBULATORY_CARE_PROVIDER_SITE_OTHER): Payer: Medicare Other | Admitting: Internal Medicine

## 2021-07-27 ENCOUNTER — Ambulatory Visit: Payer: Medicare Other | Admitting: Internal Medicine

## 2021-07-27 VITALS — BP 118/58 | HR 93 | Temp 97.7°F | Ht 67.0 in | Wt 149.0 lb

## 2021-07-27 DIAGNOSIS — J439 Emphysema, unspecified: Secondary | ICD-10-CM | POA: Diagnosis not present

## 2021-07-27 DIAGNOSIS — B948 Sequelae of other specified infectious and parasitic diseases: Secondary | ICD-10-CM

## 2021-07-27 DIAGNOSIS — Z8701 Personal history of pneumonia (recurrent): Secondary | ICD-10-CM

## 2021-07-27 DIAGNOSIS — E538 Deficiency of other specified B group vitamins: Secondary | ICD-10-CM | POA: Diagnosis not present

## 2021-07-27 DIAGNOSIS — R5383 Other fatigue: Secondary | ICD-10-CM | POA: Diagnosis not present

## 2021-07-27 DIAGNOSIS — R0989 Other specified symptoms and signs involving the circulatory and respiratory systems: Secondary | ICD-10-CM

## 2021-07-27 LAB — PULMONARY FUNCTION TEST
DL/VA % pred: 89 %
DL/VA: 3.43 ml/min/mmHg/L
DLCO cor % pred: 76 %
DLCO cor: 16.15 ml/min/mmHg
DLCO unc % pred: 76 %
DLCO unc: 16.15 ml/min/mmHg
FEF 25-75 Post: 1.14 L/sec
FEF 25-75 Pre: 1.06 L/sec
FEF2575-%Change-Post: 7 %
FEF2575-%Pred-Post: 87 %
FEF2575-%Pred-Pre: 81 %
FEV1-%Change-Post: 1 %
FEV1-%Pred-Post: 84 %
FEV1-%Pred-Pre: 83 %
FEV1-Post: 1.83 L
FEV1-Pre: 1.8 L
FEV1FVC-%Change-Post: 2 %
FEV1FVC-%Pred-Pre: 98 %
FEV6-%Change-Post: 0 %
FEV6-%Pred-Post: 89 %
FEV6-%Pred-Pre: 89 %
FEV6-Post: 2.6 L
FEV6-Pre: 2.6 L
FEV6FVC-%Change-Post: 0 %
FEV6FVC-%Pred-Post: 109 %
FEV6FVC-%Pred-Pre: 108 %
FVC-%Change-Post: 0 %
FVC-%Pred-Post: 81 %
FVC-%Pred-Pre: 82 %
FVC-Post: 2.6 L
FVC-Pre: 2.62 L
Post FEV1/FVC ratio: 70 %
Post FEV6/FVC ratio: 100 %
Pre FEV1/FVC ratio: 69 %
Pre FEV6/FVC Ratio: 99 %
RV % pred: 104 %
RV: 2.78 L
TLC % pred: 83 %
TLC: 5.41 L

## 2021-07-27 NOTE — Patient Instructions (Signed)
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 or [facility] in [place]  Follow-up --Return to see Dr. Chase Caller in 6 months or sooner if needed -15 min slot

## 2021-07-27 NOTE — Progress Notes (Signed)
Full PFT performed today. °

## 2021-07-27 NOTE — Progress Notes (Signed)
OV 02/04/2021  Subjective:  Patient ID: Todd Armstrong, male , DOB: Oct 15, 1931 , age 87 y.o. , MRN: [id number] , ADDRESS: [address] [place] [id number] PCP Lavone Orn, MD Patient Care Team: Lavone Orn, MD as PCP - General (Internal Medicine) Nahser, Wonda Cheng, MD as PCP - Cardiology (Cardiology)  This Provider for this visit: Treatment Team:  Attending Provider: Brand Males, MD    02/04/2021 -   Chief Complaint  Patient presents with   Consult    Pt being referred by Thayer Ohm, PA due to pneumonia.  Pt has had pneumonia off and on since January 2021. Denies any current complaints of cough, SOB, or chest tightness.     HPI Todd CHIRINOS 87 y.o. -referred for pneumonia.  History is gained from talking to the patient and also review of the outside medical records.  He is up-to-date with his Covid vaccine.  He tells me and it is confirmed I reviewed the chart that he started having insidious onset of shortness of breath that is got progressive associated with severe cough.  The cough persisted despite stopping his lisinopril.  The onset of this was sometime in December 2021.  It was at this time Dr. Laurann Montana stopped his lisinopril and then he thought his cough got better but by early January 2022 his cough got worse.  Then by late January 2022 he was started on PPI.  Chest x-ray at this time showed right middle and lower lobe pneumonia [documented below].  He was started on azithromycin for 5 days and Augmentin for 7 days.  When he saw a nurse practitioner on 01/19/2021 his symptoms had resolved but he was having weakness.  Chest x-ray at this time showed worsening and a referral was made [see below].  However 01/26/2021 chest x-ray showed further improvement.  I personally visualized all these chest x-rays.  At this point in time he tells me that he is actually feeling better.  He has very mild residual shortness of breath.  The cough is gone.  He denies any  aspiration or dysphagia.  Denies any vomiting.  Denies nausea.  Denies loss of consciousness.  Denies acid reflux.  Has occasional postnasal drip.  He does have dentures.  Beneath the dentures a couple maxillary carious teeth.  Has not seen the dentist in a while.  He denies any down pillows.  Denies any mold or mildew in the house denies any hot tub use.  He only has a cat in the house.  He is wondering how he got the pneumonia.  He says he was Covid negative.   Radiologic review upon personal visualization report summary below  He has had 3 chest x-rays.  The first 1 was 01/02/2021 that showed patchy areas of interstitial prominence in the right lung most concerning for right mid to lower lobe pneumonia.  It is at this point he got treated with antibiotics.  He had follow-up chest x-ray on 01/19/2021 that I also personally visualized and that showed progression.  At this point in time the referral was made.  The next chest x-ray 01/26/2021 showed improvement.  Other findings include rib deformities and also emphysema  Walking desaturation test and 85 feet x 3 laps: He only did 2 laps and stop because of shortness of breath.  Resting pulse ox 99%.  Resting heart rate 94/min.  Final pulse ox 98% and got tachycardic at 133/min.  He walked at an average pace for age.  He got limited by his tachycardia   OV 03/25/2021  Subjective:  Patient ID: Todd Armstrong, male , DOB: 1931-03-17 , age 69 y.o. , MRN: [id number] , AD[address]ESS: [address] [place] [id number] PCP Lavone Orn, MD Patient Care Team: Lavone Orn, MD as PCP - General (Internal Medicine) Nahser, Wonda Cheng, MD as PCP - Cardiology (Cardiology)  This Provider for this visit: Treatment Team:  Attending Provider: Brand Males, MD  Type of visit: Telephone/Video Circumstance: COVID-19 national emergency Identification of patient MACLAREN ARROYAVE with 09-May-1931 and MRN [id number] - 2 person identifier Risks: Risks, benefits,  limitations of telephone visit explained. Patient understood and verbalized agreement to proceed Anyone else on call: none Patient location: [phone] This provider location: [address], [phone]   03/25/2021 -follow-up visit for recurrent pneumonia.  Now diagnosed incidentally with COVID-19   HPI Todd YANNUZZI 87 y.o. -face-to-face office visit changed to telephone visit.  Is because he had Covid PCR test for pulmonary function testing and this turned out to be positive.  He denies any sick contacts.  He has had 3 doses of mRNA vaccine.  However has had runny nose for 2 weeks.  His PC has been positive.  No fever or chills or sore throat or loss of taste or loss of smell.  He is reporting chronic stable shortness of breath on exertion going to the mailbox and coming back and this is been going on for over a month or 2.  He is not had any dysphagia or aspiration episodes at home.  No loss of consciousness.  No pneumonia episodes.  He had high-resolution CT chest that is reviewed below.  Shows evidence of previous pneumonia.  Also shows emphysema.  No cancer no ILD.   SARS Coronavirus 2 NEGATIVE POSITIVE Abnormal 03/23/21   NEGATIVE      CT Chest data 03/11/21 HRCT   IMPRESSION: 1. No evidence of fibrotic interstitial lung disease. Minimal dependent scarring and bronchiectasis of the right lung base, consistent with sequelae of prior infection or inflammation. 2. Mild, diffuse bilateral bronchial wall thickening, consistent with nonspecific infectious or inflammatory bronchitis. 3. Emphysema. 4. Coronary artery disease.   Aortic Atherosclerosis (ICD10-I70.0) and Emphysema (ICD10-J43.9).     Electronically Signed   By: Eddie Candle M.D.   On: 03/11/2021 18:16    OV 07/27/2021  Subjective:  Patient ID: Todd Armstrong, male , DOB: 27-Mar-1931 , age 50 y.o. , MRN: [id number] , ADDRESS: [address] [place] [phone] PCP Lavone Orn, MD Patient Care  Team: Lavone Orn, MD as PCP - General (Internal Medicine) Nahser, Wonda Cheng, MD as PCP - Cardiology (Cardiology)  This Provider for this visit: Treatment Team:  Attending Provider: Brand Males, MD    07/27/2021 -   Chief Complaint  Patient presents with   Follow-up    PFT performed today.  Pt said that he has been doing okay since last visit and after he had Covid prior. States that he has been feeling weak but other than that, he has been okay.     HPI TSUYOSHI WHARY 87 y.o. -returns for follow-up.  He presents with his daughter who I am meeting for the first time.  He is no longer having recurrent pneumonia.  In the spring 2022 when we wanted to do pulmonary function test he tested positive for COVID and after that he got treatment for that.  He says since the [place] he is having fatigue and  tiredness but no chest pain.  He still does a lot of activities of daily living.  He lives alone.  In the spring 2022 I gave him Spiriva for his emphysema that was mild on the CT scan [CT scan did not have any evidence of pneumonia] and because of the shortness of breath.  He says this really has not helped him but albuterol helps.  Right now fatigue is the more dominant complaint.  He had pulmonary function test that shows slight reduction in DLCO consistent with his mild emphysema.  We discussed stopping Spiriva but he wanted treatment for his fatigue.  We talked about doing pulmonary rehabilitation he is very open to this idea.  [place] in [place].  He can do his rehab either at [facility] in [place] or in [place].  Both are 25 minutes away.  He also is interested in doing rehab [facility] in [place] where he will be spending substantial amount of time in late summer/early fall    CT Chest data  No results found.    PFT  PFT Results Latest Ref Rng & Units 07/27/2021  FVC-Pre L 2.62  FVC-Predicted Pre % 82  FVC-Post L 2.60  FVC-Predicted Post % 81  Pre  FEV1/FVC % % 69  Post FEV1/FCV % % 70  FEV1-Pre L 1.80  FEV1-Predicted Pre % 83  FEV1-Post L 1.83  DLCO uncorrected ml/min/mmHg 16.15  DLCO UNC% % 76  DLCO corrected ml/min/mmHg 16.15  DLCO COR %Predicted % 76  DLVA Predicted % 89  TLC L 5.41  TLC % Predicted % 83  RV % Predicted % 104       has a past medical history of Acute myocardial infarction, Carotid stenosis, Coronary artery disease, Diabetes mellitus without complication (HCC), Dyslipidemia, Hyperlipemia, Hypertension, Partial tear of rotator cuff, Peripheral vascular disease ([place]), Pulmonary hypertension ([place]), Stroke ([place]), and Wears dentures.   reports that he has quit smoking. His smoking use included cigarettes. He has a 5.00 pack-year smoking history. He has never used smokeless tobacco.  Past Surgical History:  Procedure Laterality Date   CARDIAC CATHETERIZATION  11/05/2008   CARDIAC CATHETERIZATION  10/22/1993   EF 60%   CARDIOVERSION N/A 06/05/2020   Procedure: CARDIOVERSION;  Surgeon: Acie Fredrickson Wonda Cheng, MD;  Location[facility] ENDOSCOPY;  Service: Cardiovascular;  Laterality: N/A;   CAROTID ENDARTERECTOMY Right 03/23/2018   CORONARY ANGIOPLASTY WITH STENT PLACEMENT     ENDARTERECTOMY Right 03/23/2018   Procedure: RIGHT CAROTID ARTERY ENDARTERECTOMY;  Surgeon: Waynetta Sandy, MD;  Location: [facility];  Service: Vascular;  Laterality: Right;   FRACTURE SURGERY     righ fibula and tibia   HERNIA REPAIR  1984   KNEE ARTHROSCOPY Left    MULTIPLE TOOTH EXTRACTIONS     PATCH ANGIOPLASTY Right 03/23/2018   Procedure: WITH Rueben Bash 1CM X 6CM PATCH ANGIOPLASTY;  Surgeon: Waynetta Sandy, MD;  Location: [place] [facility];  Service: Vascular;  Laterality: Right;   SHOULDER ARTHROSCOPY Right 06/09/2017   Procedure: ARTHROSCOPY SHOULDER SUBACROMIAL DECOMPRESSION AND ACROMIOPLASTY;  Surgeon: Melrose Nakayama, MD;  Location: [place] [facility] [place];  Service: Orthopedics;  Laterality: Right;  GENERAL ANESTHESIA WITH BLOCK    No Known  Allergies  Immunization History  Administered Date(s) Administered   Influenza Split 10/07/2009, 10/12/2010, 09/13/2011, 10/17/2012, 09/10/2013, 10/07/2014   Influenza, High Dose Seasonal PF 08/05/2015, 09/21/2016, 10/13/2017, 09/12/2018, 08/22/2019, 10/31/2020   PFIZER(Purple Top)SARS-COV-2 Vaccination 02/14/2020, 03/12/2020, 10/10/2020   Pneumococcal Conjugate-13 01/31/2014   Pneumococcal Polysaccharide-23 09/12/2001  Td 12/13/2005   Tdap 08/09/2016   Zoster, Live 12/08/2011    Family History  Problem Relation Age of Onset   Heart attack Father      Current Outpatient Medications:    albuterol (VENTOLIN HFA) 108 (90 Base) MCG/ACT inhaler, Inhale 2 puffs into the lungs every 6 (six) hours as needed for wheezing or shortness of breath., Disp: 8 g, Rfl: 6   apixaban (ELIQUIS) 5 MG TABS tablet, Take 1 tablet (5 mg total) by mouth 2 (two) times daily., Disp: 180 tablet, Rfl: 1   azithromycin (ZITHROMAX) 250 MG tablet, Take two today and then one daily until finished., Disp: 6 tablet, Rfl: 0   carvedilol (COREG) 12.5 MG tablet, TAKE 1 TABLET BY MOUTH  TWICE DAILY, Disp: 180 tablet, Rfl: 3   clopidogrel (PLAVIX) 75 MG tablet, TAKE 1 TABLET BY MOUTH  DAILY, Disp: 90 tablet, Rfl: 2   diltiazem (CARDIZEM) 120 MG tablet, Take 1 capsule by mouth daily., Disp: , Rfl:    furosemide (LASIX) 40 MG tablet, Take 40 mg by mouth daily., Disp: , Rfl:    metFORMIN (GLUCOPHAGE) 1000 MG tablet, Take 1,000 mg by mouth 2 (two) times daily with a meal., Disp: , Rfl:    nitroGLYCERIN (NITROSTAT) 0.4 MG SL tablet, Place 1 tablet (0.4 mg total) under the tongue every 5 (five) minutes as needed for chest pain., Disp: 25 tablet, Rfl: 6   potassium chloride (KLOR-CON) 10 MEQ tablet, Take 10 mEq by mouth daily. As needed, Disp: , Rfl:    rosuvastatin (CRESTOR) 10 MG tablet, TAKE 1 TABLET BY MOUTH  DAILY, Disp: 90 tablet, Rfl: 3   tamsulosin (FLOMAX) 0.4 MG CAPS capsule, Take 0.4 mg by mouth at bedtime. , Disp: ,  Rfl:    Tiotropium Bromide Monohydrate (SPIRIVA RESPIMAT) 2.5 MCG/ACT AERS, Inhale 2 puffs into the lungs daily., Disp: 4 g, Rfl: 0   Tiotropium Bromide Monohydrate (SPIRIVA RESPIMAT) 2.5 MCG/ACT AERS, Inhale 2 puffs into the lungs daily., Disp: 4 g, Rfl: 5   triamcinolone cream (KENALOG) 0.1 %, Apply 1 application topically as needed., Disp: , Rfl:       Objective:   Vitals:   07/27/21 1457  BP: (!) 118/58  Pulse: 93  Temp: 97.7 F (36.5 C)  TempSrc: Oral  SpO2: 97%  Weight: 149 lb (67.6 kg)  Height: '5\' 7"'$  (1.702 m)    Estimated body mass index is 23.34 kg/m as calculated from the following:   Height as of this encounter: '5\' 7"'$  (1.702 m).   Weight as of this encounter: 149 lb (67.6 kg).  '@WEIGHTCHANGE'$ @  [employer]   07/27/21 1457  Weight: 149 lb (67.6 kg)     Physical Exam    General: No distress.  Frail looks deconditioned but pretty good for age. Neuro: Alert and Oriented x 3. GCS 15. Speech normal Psych: Pleasant Resp:  Barrel Chest - no.  Wheeze - no, Crackles - no, No overt respiratory distress CVS: Normal heart sounds. Murmurs - no Ext: Stigmata of Connective Tissue Disease - no HEENT: Normal upper airway. PEERL +. No post nasal drip        Assessment:       ICD-10-CM   1. Pulmonary emphysema, unspecified emphysema type ([place])  J43.9     2. History of pneumonia  Z87.01     3. 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 Chase Caller in 6 months or sooner if needed -15 min slot     SIGNATURE    Dr. Brand Males, M.D., F.C.C.P,  Pulmonary and Critical Care Medicine Staff Physician, [place] Director - Interstitial Lung Disease  Program  Pulmonary [place] at [place], [place], [address]  Pager: [phone], If no answer or between  15:00h - 7:00h: call [phone] Telephone: [phone]  3:53 PM 07/27/2021

## 2021-07-27 NOTE — Patient Instructions (Signed)
Full PFT performed today. °

## 2021-07-28 NOTE — Addendum Note (Signed)
Addended by: Lorretta Harp on: 07/28/2021 10:37 AM   Modules accepted: Orders

## 2021-08-20 DIAGNOSIS — J439 Emphysema, unspecified: Secondary | ICD-10-CM | POA: Diagnosis not present

## 2021-08-20 DIAGNOSIS — I5032 Chronic diastolic (congestive) heart failure: Secondary | ICD-10-CM | POA: Diagnosis not present

## 2021-08-20 DIAGNOSIS — I482 Chronic atrial fibrillation, unspecified: Secondary | ICD-10-CM | POA: Diagnosis not present

## 2021-08-20 DIAGNOSIS — I2510 Atherosclerotic heart disease of native coronary artery without angina pectoris: Secondary | ICD-10-CM | POA: Diagnosis not present

## 2021-09-15 ENCOUNTER — Ambulatory Visit: Payer: Medicare Other | Admitting: Physician Assistant

## 2021-09-15 ENCOUNTER — Other Ambulatory Visit: Payer: Self-pay

## 2021-09-15 ENCOUNTER — Ambulatory Visit (HOSPITAL_COMMUNITY)
Admission: RE | Admit: 2021-09-15 | Discharge: 2021-09-15 | Disposition: A | Discharge status: Home / Self Care | Payer: Medicare Other | Source: Ambulatory Visit | Attending: Vascular Surgery | Admitting: Vascular Surgery

## 2021-09-15 VITALS — BP 142/76 | HR 93 | Temp 97.7°F | Resp 20 | Ht 67.0 in | Wt 155.4 lb

## 2021-09-15 DIAGNOSIS — I6523 Occlusion and stenosis of bilateral carotid arteries: Secondary | ICD-10-CM

## 2021-09-15 NOTE — Progress Notes (Signed)
History of Present Illness:  Patient is a 87 y.o. year old male who presents for evaluation of carotid stenosis.   Pt is s/p right CEA on 03/23/2018 by Dr. Donzetta Matters. The patient denies symptoms of TIA or stroke.   Pt denies any amaurosis fugax, speech difficulties, weakness, numbness, paralysis or clumsiness.  He did get COVID and he is having breathing issues.  He is scheduled to go to pulmonary rehab soon.    Past Medical History:  Diagnosis Date   Acute myocardial infarction    Carotid stenosis    Coronary artery disease    PTCA and stenting of his right coronary artery. 3.5 x 16-mm Liberte stent.               Diabetes mellitus without complication ([place])    Dyslipidemia    Hyperlipemia    Hypertension    Partial tear of rotator cuff    right shoulder   Peripheral vascular disease ([place])    Pulmonary hypertension (HCC)    moderate pulmonary HTN by 2007 echo (RVSP 52 mmHg)   Stroke [facility])    Wears dentures     Past Surgical History:  Procedure Laterality Date   CARDIAC CATHETERIZATION  11/05/2008   CARDIAC CATHETERIZATION  10/22/1993   EF 60%   CARDIOVERSION N/A 06/05/2020   Procedure: CARDIOVERSION;  Surgeon: Thayer Headings, MD;  Location: [facility] ENDOSCOPY;  Service: Cardiovascular;  Laterality: N/A;   CAROTID ENDARTERECTOMY Right 03/23/2018   CORONARY ANGIOPLASTY WITH STENT PLACEMENT     ENDARTERECTOMY Right 03/23/2018   Procedure: RIGHT CAROTID ARTERY ENDARTERECTOMY;  Surgeon: Waynetta Sandy, MD;  Location: [place];  Service: Vascular;  Laterality: Right;   FRACTURE SURGERY     righ fibula and tibia   HERNIA REPAIR  1984   KNEE ARTHROSCOPY Left    MULTIPLE TOOTH EXTRACTIONS     PATCH ANGIOPLASTY Right 03/23/2018   Procedure: WITH Rueben Bash 1CM X 6CM PATCH ANGIOPLASTY;  Surgeon: Waynetta Sandy, MD;  Location: [place];  Service: Vascular;  Laterality: Right;   SHOULDER ARTHROSCOPY Right 06/09/2017   Procedure: ARTHROSCOPY SHOULDER SUBACROMIAL DECOMPRESSION  AND ACROMIOPLASTY;  Surgeon: Melrose Nakayama, MD;  Location: [place];  Service: Orthopedics;  Laterality: Right;  GENERAL ANESTHESIA WITH BLOCK     Social History Social History   Tobacco Use   Smoking status: Former    Packs/day: 1.00    Years: 5.00    Pack years: 5.00    Types: Cigarettes   Smokeless tobacco: Never   Tobacco comments:    quit smoking cigarettes > 40 years ago  Vaping Use   Vaping Use: Never used  Substance Use Topics   Alcohol use: No   Drug use: No    Family History Family History  Problem Relation Age of Onset   Heart attack Father     Allergies  No Known Allergies   Current Outpatient Medications  Medication Sig Dispense Refill   albuterol (VENTOLIN HFA) 108 (90 Base) MCG/ACT inhaler Inhale 2 puffs into the lungs every 6 (six) hours as needed for wheezing or shortness of breath. 8 g 6   apixaban (ELIQUIS) 5 MG TABS tablet Take 1 tablet (5 mg total) by mouth 2 (two) times daily. 180 tablet 1   azithromycin (ZITHROMAX) 250 MG tablet Take two today and then one daily until finished. 6 tablet 0   carvedilol (COREG) 12.5 MG tablet TAKE 1 TABLET BY MOUTH  TWICE DAILY 180 tablet 3  clopidogrel (PLAVIX) 75 MG tablet TAKE 1 TABLET BY MOUTH  DAILY 90 tablet 2   diltiazem (CARDIZEM) 120 MG tablet Take 1 capsule by mouth daily.     furosemide (LASIX) 40 MG tablet Take 40 mg by mouth daily.     metFORMIN (GLUCOPHAGE) 1000 MG tablet Take 1,000 mg by mouth 2 (two) times daily with a meal.     nitroGLYCERIN (NITROSTAT) 0.4 MG SL tablet Place 1 tablet (0.4 mg total) under the tongue every 5 (five) minutes as needed for chest pain. 25 tablet 6   potassium chloride (KLOR-CON) 10 MEQ tablet Take 10 mEq by mouth daily. As needed     rosuvastatin (CRESTOR) 10 MG tablet TAKE 1 TABLET BY MOUTH  DAILY 90 tablet 3   tamsulosin (FLOMAX) 0.4 MG CAPS capsule Take 0.4 mg by mouth at bedtime.      Tiotropium Bromide Monohydrate (SPIRIVA RESPIMAT) 2.5 MCG/ACT AERS Inhale 2 puffs  into the lungs daily. 4 g 0   Tiotropium Bromide Monohydrate (SPIRIVA RESPIMAT) 2.5 MCG/ACT AERS Inhale 2 puffs into the lungs daily. 4 g 5   triamcinolone cream (KENALOG) 0.1 % Apply 1 application topically as needed.     No current facility-administered medications for this visit.    ROS:   General:  No weight loss, Fever, chills  HEENT: No recent headaches, no nasal bleeding, no visual changes, no sore throat  Neurologic: No dizziness, blackouts, seizures. No recent symptoms of stroke or mini- stroke. No recent episodes of slurred speech, or temporary blindness.  Cardiac: No recent episodes of chest pain/pressure, no shortness of breath at rest.  No shortness of breath with exertion.  Denies history of atrial fibrillation or irregular heartbeat  Vascular: No history of rest pain in feet.  No history of claudication.  No history of non-healing ulcer, No history of DVT   Pulmonary: No home oxygen, no productive cough, no hemoptysis,  No asthma or wheezing  recent history of COVID  Musculoskeletal:  [ ]  Arthritis, [ ]  Low back pain,  [x ] Joint pain  Hematologic:No history of hypercoagulable state.  No history of easy bleeding.  No history of anemia  Gastrointestinal: No hematochezia or melena,  No gastroesophageal reflux, no trouble swallowing  Urinary: [ ]  chronic Kidney disease, [ ]  on HD - [ ]  MWF or [ ]  TTHS, [ ]  Burning with urination, [ ]  Frequent urination, [ ]  Difficulty urinating;   Skin: No rashes  Psychological: No history of anxiety,  No history of depression   Physical Examination  Vitals:   09/15/21 1129 09/15/21 1132  BP: 139/74 (!) 142/76  Pulse: 93   Resp: 20   Temp: 97.7 F (36.5 C)   TempSrc: Temporal   SpO2: 98%   Weight: 155 lb 6.4 oz (70.5 kg)   Height: 5\' 7"  (1.702 m)     Body mass index is 24.34 kg/m.  General:  Alert and oriented, no acute distress HEENT: Normal Neck: No bruit or JVD Pulmonary: Clear to auscultation  bilaterally Cardiac: Regular Rate and Rhythm without murmur Gastrointestinal: Soft, non-tender, non-distended, no mass, no scars Skin: No rash Extremity Pulses:  2+ radial, brachial, femoral, dorsalis pedis, posterior tibial pulses bilaterally Musculoskeletal: No deformity or edema  Neurologic: Upper and lower extremity motor grossly intact and symmetric  DATA:  Right Carotid Findings:  +----------+--------+--------+--------+------------------+--------+            PSV cm/sEDV cm/sStenosisPlaque DescriptionComments  +----------+--------+--------+--------+------------------+--------+  CCA Prox  78  14                                          +----------+--------+--------+--------+------------------+--------+  CCA Mid   68      17                                          +----------+--------+--------+--------+------------------+--------+  CCA Distal75      16                                          +----------+--------+--------+--------+------------------+--------+  ICA Prox  126     35      1-39%   smooth                      +----------+--------+--------+--------+------------------+--------+  ICA Mid   161     51      40-59%  homogeneous                 +----------+--------+--------+--------+------------------+--------+  ICA Distal97      31                                          +----------+--------+--------+--------+------------------+--------+  ECA       79                                                  +----------+--------+--------+--------+------------------+--------+   +----------+--------+-------+----------------+-------------------+            PSV cm/sEDV cmsDescribe        Arm Pressure (mmHG)  +----------+--------+-------+----------------+-------------------+  [id number]            Multiphasic, WNL                     +----------+--------+-------+----------------+-------------------+    +---------+--------+--+--------+--+---------+  VertebralPSV cm/s62EDV cm/s18Antegrade  +---------+--------+--+--------+--+---------+       Left Carotid Findings:  +----------+--------+--------+--------+----------------------+--------+            PSV cm/sEDV cm/sStenosisPlaque Description    Comments  +----------+--------+--------+--------+----------------------+--------+  CCA Prox  107     28                                              +----------+--------+--------+--------+----------------------+--------+  CCA Mid   60      24                                              +----------+--------+--------+--------+----------------------+--------+  CCA Distal83      33              heterogenous                    +----------+--------+--------+--------+----------------------+--------+  ICA Prox  118     28      1-39%   calcific and irregular          +----------+--------+--------+--------+----------------------+--------+  ICA Mid   100     27                                              +----------+--------+--------+--------+----------------------+--------+  ICA Distal67      22                                              +----------+--------+--------+--------+----------------------+--------+  ECA       106                                                     +----------+--------+--------+--------+----------------------+--------+   +----------+--------+--------+----------------+-------------------+            PSV cm/sEDV cm/sDescribe        Arm Pressure (mmHG)  +----------+--------+--------+----------------+-------------------+  [id number]              Multiphasic, WNL                     +----------+--------+--------+----------------+-------------------+   +---------+--------+--+--------+--+---------+  VertebralPSV cm/s67EDV cm/s15Antegrade  +---------+--------+--+--------+--+---------+      Summary:  Right Carotid: Velocities in the right ICA are consistent with a 40-59%                 stenosis.   Left Carotid: Velocities in the left ICA are consistent with a 1-39%  stenosis.   Vertebrals:  Bilateral vertebral arteries demonstrate antegrade flow.  Subclavians: Normal flow hemodynamics were seen in bilateral subclavian               arteries.   ASSESSMENT/Plan:  S/P right CEA 03/23/2018 by Dr. Donzetta Matters. He remains asymptomatic.  The right ICA stenosis increased to EDV of 51 from previous of 44 cms.  He is still < 59% stenosis.  He will call 911 if he develops symptoms of stroke/TIA.  He is on Eliquis daily for A fib, Crestor and Plavix for maximum medical therapy.    He will f/u for repeat carotid duplex in 1 year.        Roxy Horseman PA-C [facility] Office: [phone]  MD in clinic [place]

## 2021-09-17 DIAGNOSIS — L570 Actinic keratosis: Secondary | ICD-10-CM | POA: Diagnosis not present

## 2021-09-17 DIAGNOSIS — D485 Neoplasm of uncertain behavior of skin: Secondary | ICD-10-CM | POA: Diagnosis not present

## 2021-09-17 DIAGNOSIS — L308 Other specified dermatitis: Secondary | ICD-10-CM | POA: Diagnosis not present

## 2021-09-17 DIAGNOSIS — I872 Venous insufficiency (chronic) (peripheral): Secondary | ICD-10-CM | POA: Diagnosis not present

## 2021-09-17 DIAGNOSIS — D1801 Hemangioma of skin and subcutaneous tissue: Secondary | ICD-10-CM | POA: Diagnosis not present

## 2021-09-17 DIAGNOSIS — I8311 Varicose veins of right lower extremity with inflammation: Secondary | ICD-10-CM | POA: Diagnosis not present

## 2021-09-17 DIAGNOSIS — C44319 Basal cell carcinoma of skin of other parts of face: Secondary | ICD-10-CM | POA: Diagnosis not present

## 2021-09-17 DIAGNOSIS — L853 Xerosis cutis: Secondary | ICD-10-CM | POA: Diagnosis not present

## 2021-09-17 DIAGNOSIS — Z85828 Personal history of other malignant neoplasm of skin: Secondary | ICD-10-CM | POA: Diagnosis not present

## 2021-09-17 DIAGNOSIS — L812 Freckles: Secondary | ICD-10-CM | POA: Diagnosis not present

## 2021-09-27 ENCOUNTER — Encounter (HOSPITAL_COMMUNITY): Payer: Self-pay

## 2021-09-27 ENCOUNTER — Inpatient Hospital Stay (HOSPITAL_COMMUNITY)
Admission: EM | Admit: 2021-09-27 | Discharge: 2021-10-01 | DRG: 291 | Disposition: A | Discharge status: Home Health | Payer: Medicare Other | Attending: Internal Medicine | Admitting: Internal Medicine

## 2021-09-27 ENCOUNTER — Other Ambulatory Visit: Payer: Self-pay

## 2021-09-27 ENCOUNTER — Emergency Department (HOSPITAL_COMMUNITY): Payer: Medicare Other

## 2021-09-27 DIAGNOSIS — I5023 Acute on chronic systolic (congestive) heart failure: Secondary | ICD-10-CM | POA: Diagnosis not present

## 2021-09-27 DIAGNOSIS — Z87891 Personal history of nicotine dependence: Secondary | ICD-10-CM

## 2021-09-27 DIAGNOSIS — R0602 Shortness of breath: Secondary | ICD-10-CM

## 2021-09-27 DIAGNOSIS — I4819 Other persistent atrial fibrillation: Secondary | ICD-10-CM | POA: Diagnosis present

## 2021-09-27 DIAGNOSIS — Z8249 Family history of ischemic heart disease and other diseases of the circulatory system: Secondary | ICD-10-CM | POA: Diagnosis not present

## 2021-09-27 DIAGNOSIS — N179 Acute kidney failure, unspecified: Secondary | ICD-10-CM | POA: Diagnosis present

## 2021-09-27 DIAGNOSIS — I130 Hypertensive heart and chronic kidney disease with heart failure and stage 1 through stage 4 chronic kidney disease, or unspecified chronic kidney disease: Secondary | ICD-10-CM | POA: Diagnosis not present

## 2021-09-27 DIAGNOSIS — I4821 Permanent atrial fibrillation: Secondary | ICD-10-CM | POA: Diagnosis not present

## 2021-09-27 DIAGNOSIS — I272 Pulmonary hypertension, unspecified: Secondary | ICD-10-CM | POA: Diagnosis present

## 2021-09-27 DIAGNOSIS — E785 Hyperlipidemia, unspecified: Secondary | ICD-10-CM | POA: Diagnosis not present

## 2021-09-27 DIAGNOSIS — Z8616 Personal history of COVID-19: Secondary | ICD-10-CM | POA: Diagnosis not present

## 2021-09-27 DIAGNOSIS — J90 Pleural effusion, not elsewhere classified: Secondary | ICD-10-CM | POA: Diagnosis not present

## 2021-09-27 DIAGNOSIS — I5033 Acute on chronic diastolic (congestive) heart failure: Secondary | ICD-10-CM

## 2021-09-27 DIAGNOSIS — I10 Essential (primary) hypertension: Secondary | ICD-10-CM | POA: Diagnosis not present

## 2021-09-27 DIAGNOSIS — I2510 Atherosclerotic heart disease of native coronary artery without angina pectoris: Secondary | ICD-10-CM | POA: Diagnosis present

## 2021-09-27 DIAGNOSIS — E782 Mixed hyperlipidemia: Secondary | ICD-10-CM | POA: Diagnosis not present

## 2021-09-27 DIAGNOSIS — R404 Transient alteration of awareness: Secondary | ICD-10-CM | POA: Diagnosis not present

## 2021-09-27 DIAGNOSIS — Z20822 Contact with and (suspected) exposure to covid-19: Secondary | ICD-10-CM | POA: Diagnosis present

## 2021-09-27 DIAGNOSIS — N1831 Chronic kidney disease, stage 3a: Secondary | ICD-10-CM | POA: Diagnosis not present

## 2021-09-27 DIAGNOSIS — R6889 Other general symptoms and signs: Secondary | ICD-10-CM | POA: Diagnosis not present

## 2021-09-27 DIAGNOSIS — M7989 Other specified soft tissue disorders: Secondary | ICD-10-CM | POA: Diagnosis not present

## 2021-09-27 DIAGNOSIS — E1122 Type 2 diabetes mellitus with diabetic chronic kidney disease: Secondary | ICD-10-CM | POA: Diagnosis not present

## 2021-09-27 DIAGNOSIS — Z7901 Long term (current) use of anticoagulants: Secondary | ICD-10-CM | POA: Diagnosis not present

## 2021-09-27 DIAGNOSIS — I252 Old myocardial infarction: Secondary | ICD-10-CM

## 2021-09-27 DIAGNOSIS — Z955 Presence of coronary angioplasty implant and graft: Secondary | ICD-10-CM | POA: Diagnosis not present

## 2021-09-27 DIAGNOSIS — Z743 Need for continuous supervision: Secondary | ICD-10-CM | POA: Diagnosis not present

## 2021-09-27 DIAGNOSIS — Z8673 Personal history of transient ischemic attack (TIA), and cerebral infarction without residual deficits: Secondary | ICD-10-CM | POA: Diagnosis not present

## 2021-09-27 DIAGNOSIS — E1169 Type 2 diabetes mellitus with other specified complication: Secondary | ICD-10-CM | POA: Diagnosis present

## 2021-09-27 DIAGNOSIS — J439 Emphysema, unspecified: Secondary | ICD-10-CM | POA: Diagnosis not present

## 2021-09-27 DIAGNOSIS — J431 Panlobular emphysema: Secondary | ICD-10-CM | POA: Diagnosis not present

## 2021-09-27 DIAGNOSIS — I5043 Acute on chronic combined systolic (congestive) and diastolic (congestive) heart failure: Secondary | ICD-10-CM | POA: Diagnosis present

## 2021-09-27 DIAGNOSIS — Z7984 Long term (current) use of oral hypoglycemic drugs: Secondary | ICD-10-CM | POA: Diagnosis not present

## 2021-09-27 DIAGNOSIS — Z66 Do not resuscitate: Secondary | ICD-10-CM | POA: Diagnosis not present

## 2021-09-27 DIAGNOSIS — J9811 Atelectasis: Secondary | ICD-10-CM | POA: Diagnosis not present

## 2021-09-27 DIAGNOSIS — E1151 Type 2 diabetes mellitus with diabetic peripheral angiopathy without gangrene: Secondary | ICD-10-CM | POA: Diagnosis present

## 2021-09-27 DIAGNOSIS — I509 Heart failure, unspecified: Secondary | ICD-10-CM

## 2021-09-27 DIAGNOSIS — Z79899 Other long term (current) drug therapy: Secondary | ICD-10-CM | POA: Diagnosis not present

## 2021-09-27 DIAGNOSIS — Z7902 Long term (current) use of antithrombotics/antiplatelets: Secondary | ICD-10-CM

## 2021-09-27 LAB — CBC WITH DIFFERENTIAL/PLATELET
Abs Immature Granulocytes: 0.07 10*3/uL (ref 0.00–0.07)
Basophils Absolute: 0.1 10*3/uL (ref 0.0–0.1)
Basophils Relative: 1 %
Eosinophils Absolute: 0.2 10*3/uL (ref 0.0–0.5)
Eosinophils Relative: 3 %
HCT: 32.8 % — ABNORMAL LOW (ref 39.0–52.0)
Hemoglobin: 10.2 g/dL — ABNORMAL LOW (ref 13.0–17.0)
Immature Granulocytes: 1 %
Lymphocytes Relative: 21 %
Lymphs Abs: 1.4 10*3/uL (ref 0.7–4.0)
MCH: 30.1 pg (ref 26.0–34.0)
MCHC: 31.1 g/dL (ref 30.0–36.0)
MCV: 96.8 fL (ref 80.0–100.0)
Monocytes Absolute: 0.4 10*3/uL (ref 0.1–1.0)
Monocytes Relative: 7 %
Neutro Abs: 4.4 10*3/uL (ref 1.7–7.7)
Neutrophils Relative %: 67 %
Platelets: 149 10*3/uL — ABNORMAL LOW (ref 150–400)
RBC: 3.39 MIL/uL — ABNORMAL LOW (ref 4.22–5.81)
RDW: 16.1 % — ABNORMAL HIGH (ref 11.5–15.5)
WBC: 6.5 10*3/uL (ref 4.0–10.5)
nRBC: 0 % (ref 0.0–0.2)

## 2021-09-27 LAB — BASIC METABOLIC PANEL
Anion gap: 8 (ref 5–15)
BUN: 18 mg/dL (ref 8–23)
CO2: 21 mmol/L — ABNORMAL LOW (ref 22–32)
Calcium: 8.7 mg/dL — ABNORMAL LOW (ref 8.9–10.3)
Chloride: 104 mmol/L (ref 98–111)
Creatinine, Ser: 1.09 mg/dL (ref 0.61–1.24)
GFR, Estimated: >60 mL/min (ref >60)
Glucose, Bld: 125 mg/dL — ABNORMAL HIGH (ref 70–99)
Potassium: 4.4 mmol/L (ref 3.5–5.1)
Sodium: 133 mmol/L — ABNORMAL LOW (ref 135–145)

## 2021-09-27 LAB — RESP PANEL BY RT-PCR (FLU A&B, COVID) ARPGX2
Influenza A by PCR: NEGATIVE
Influenza B by PCR: NEGATIVE
SARS Coronavirus 2 by RT PCR: NEGATIVE

## 2021-09-27 LAB — BRAIN NATRIURETIC PEPTIDE: B Natriuretic Peptide: 464.8 pg/mL — ABNORMAL HIGH (ref 0.0–100.0)

## 2021-09-27 LAB — GLUCOSE, CAPILLARY: Glucose-Capillary: 129 mg/dL — ABNORMAL HIGH (ref 70–99)

## 2021-09-27 LAB — TROPONIN I (HIGH SENSITIVITY)
Troponin I (High Sensitivity): 13 ng/L (ref <18)
Troponin I (High Sensitivity): 16 ng/L (ref <18)

## 2021-09-27 MED ORDER — FUROSEMIDE 10 MG/ML IJ SOLN
40.0000 mg | Freq: Two times a day (BID) | INTRAMUSCULAR | Status: DC
  Administered 2021-09-27 – 2021-09-29 (×4): 40 mg via INTRAVENOUS
  Filled 2021-09-27 (×4): qty 4

## 2021-09-27 MED ORDER — SODIUM CHLORIDE 0.9 % IV SOLN: INTRAVENOUS | Status: DC

## 2021-09-27 MED ORDER — APIXABAN 5 MG PO TABS
5.0000 mg | ORAL_TABLET | Freq: Two times a day (BID) | ORAL | Status: DC
  Administered 2021-09-27 – 2021-10-01 (×8): 5 mg via ORAL
  Filled 2021-09-27 (×8): qty 1

## 2021-09-27 MED ORDER — IPRATROPIUM-ALBUTEROL 0.5-2.5 (3) MG/3ML IN SOLN
3.0000 mL | Freq: Four times a day (QID) | RESPIRATORY_TRACT | Status: DC
  Administered 2021-09-27: 3 mL via RESPIRATORY_TRACT
  Filled 2021-09-27: qty 3

## 2021-09-27 MED ORDER — TAMSULOSIN HCL 0.4 MG PO CAPS
0.4000 mg | ORAL_CAPSULE | Freq: Every day | ORAL | Status: DC
  Administered 2021-09-27 – 2021-09-30 (×4): 0.4 mg via ORAL
  Filled 2021-09-27 (×4): qty 1

## 2021-09-27 MED ORDER — CLOPIDOGREL BISULFATE 75 MG PO TABS
75.0000 mg | ORAL_TABLET | Freq: Every day | ORAL | Status: DC
  Administered 2021-09-28 – 2021-10-01 (×4): 75 mg via ORAL
  Filled 2021-09-27 (×4): qty 1

## 2021-09-27 MED ORDER — ROSUVASTATIN CALCIUM 10 MG PO TABS
10.0000 mg | ORAL_TABLET | Freq: Every day | ORAL | Status: DC
  Administered 2021-09-28 – 2021-10-01 (×4): 10 mg via ORAL
  Filled 2021-09-27 (×4): qty 1

## 2021-09-27 MED ORDER — ONDANSETRON HCL 4 MG/2ML IJ SOLN: 4.0000 mg | Freq: Four times a day (QID) | INTRAMUSCULAR | Status: DC | PRN

## 2021-09-27 MED ORDER — ACETAMINOPHEN 650 MG RE SUPP: 650.0000 mg | Freq: Four times a day (QID) | RECTAL | Status: DC | PRN

## 2021-09-27 MED ORDER — IPRATROPIUM-ALBUTEROL 0.5-2.5 (3) MG/3ML IN SOLN
3.0000 mL | Freq: Three times a day (TID) | RESPIRATORY_TRACT | Status: DC
  Administered 2021-09-28 – 2021-09-29 (×3): 3 mL via RESPIRATORY_TRACT
  Filled 2021-09-27 (×3): qty 3

## 2021-09-27 MED ORDER — ACETAMINOPHEN 325 MG PO TABS
650.0000 mg | ORAL_TABLET | Freq: Four times a day (QID) | ORAL | Status: DC | PRN
  Filled 2021-09-27: qty 2

## 2021-09-27 MED ORDER — DILTIAZEM HCL 60 MG PO TABS
120.0000 mg | ORAL_TABLET | Freq: Every day | ORAL | Status: DC
  Administered 2021-09-28: 120 mg via ORAL
  Filled 2021-09-27: qty 2

## 2021-09-27 MED ORDER — INSULIN ASPART 100 UNIT/ML IJ SOLN
0.0000 [IU] | Freq: Three times a day (TID) | INTRAMUSCULAR | Status: DC
  Administered 2021-09-28: 8 [IU] via SUBCUTANEOUS
  Administered 2021-09-29: 2 [IU] via SUBCUTANEOUS
  Administered 2021-09-29: 8 [IU] via SUBCUTANEOUS
  Administered 2021-09-30: 2 [IU] via SUBCUTANEOUS
  Administered 2021-09-30: 5 [IU] via SUBCUTANEOUS
  Administered 2021-10-01: 2 [IU] via SUBCUTANEOUS

## 2021-09-27 MED ORDER — INSULIN ASPART 100 UNIT/ML IJ SOLN
0.0000 [IU] | Freq: Every day | INTRAMUSCULAR | Status: DC
  Administered 2021-09-28: 2 [IU] via SUBCUTANEOUS

## 2021-09-27 MED ORDER — ALBUTEROL SULFATE HFA 108 (90 BASE) MCG/ACT IN AERS: 2.0000 | INHALATION_SPRAY | RESPIRATORY_TRACT | Status: DC | PRN

## 2021-09-27 MED ORDER — BUDESONIDE 0.25 MG/2ML IN SUSP
0.2500 mg | Freq: Two times a day (BID) | RESPIRATORY_TRACT | Status: DC
  Administered 2021-09-27 – 2021-10-01 (×8): 0.25 mg via RESPIRATORY_TRACT
  Filled 2021-09-27 (×8): qty 2

## 2021-09-27 MED ORDER — ONDANSETRON HCL 4 MG PO TABS: 4.0000 mg | ORAL_TABLET | Freq: Four times a day (QID) | ORAL | Status: DC | PRN

## 2021-09-27 MED ORDER — FUROSEMIDE 10 MG/ML IJ SOLN
40.0000 mg | Freq: Once | INTRAMUSCULAR | Status: AC
  Administered 2021-09-27: 40 mg via INTRAVENOUS
  Filled 2021-09-27: qty 4

## 2021-09-27 MED ORDER — ALBUTEROL SULFATE (2.5 MG/3ML) 0.083% IN NEBU: 2.5000 mg | INHALATION_SOLUTION | RESPIRATORY_TRACT | Status: DC | PRN

## 2021-09-27 MED ORDER — CARVEDILOL 12.5 MG PO TABS
12.5000 mg | ORAL_TABLET | Freq: Two times a day (BID) | ORAL | Status: DC
  Administered 2021-09-27 – 2021-09-28 (×2): 12.5 mg via ORAL
  Filled 2021-09-27 (×2): qty 1

## 2021-09-27 NOTE — ED Provider Notes (Signed)
[facility] DEPT Provider Note   CSN: [id number] Arrival date & time: 09/27/21  1348     History Chief Complaint  Patient presents with   Shortness of Breath    Todd Armstrong is a 87 y.o. male.  87 year old male with history of chronic A. fib on Eliquis presents with several weeks of increasing dyspnea on exertion as well as orthopnea.  Denies any fever or cough.  No chest pain or chest pressure.  Some increased lower extremity edema.  Today while at church, he became more short of breath with ambulation.  Went to the fire station who called EMS and was transported here.  Prior to this, patient did receive 500 cc of saline.  Does have a history of CHF      Past Medical History:  Diagnosis Date   Acute myocardial infarction    Carotid stenosis    Coronary artery disease    PTCA and stenting of his right coronary artery. 3.5 x 16-mm Liberte stent.               Diabetes mellitus without complication (HCC)    Dyslipidemia    Hyperlipemia    Hypertension    Partial tear of rotator cuff    right shoulder   Peripheral vascular disease (HCC)    Pulmonary hypertension (HCC)    moderate pulmonary HTN by 2007 echo (RVSP 52 mmHg)   Stroke [facility])    Wears dentures     Patient Active Problem List   Diagnosis Date Noted   Pulmonary HTN ([place]) 06/03/2021   Persistent atrial fibrillation (HCC)    Carotid stenosis, asymptomatic, right 03/23/2018   Bilateral carotid bruits 10/15/2016   Acute myocardial infarction    Dyslipidemia    Coronary artery disease    Hypertension    Hyperlipemia     Past Surgical History:  Procedure Laterality Date   CARDIAC CATHETERIZATION  11/05/2008   CARDIAC CATHETERIZATION  10/22/1993   EF 60%   CARDIOVERSION N/A 06/05/2020   Procedure: CARDIOVERSION;  Surgeon: Thayer Headings, MD;  Location: [place];  Service: Cardiovascular;  Laterality: N/A;   CAROTID ENDARTERECTOMY Right 03/23/2018   CORONARY  ANGIOPLASTY WITH STENT PLACEMENT     ENDARTERECTOMY Right 03/23/2018   Procedure: RIGHT CAROTID ARTERY ENDARTERECTOMY;  Surgeon: Waynetta Sandy, MD;  Location: [place];  Service: Vascular;  Laterality: Right;   FRACTURE SURGERY     righ fibula and tibia   HERNIA REPAIR  1984   KNEE ARTHROSCOPY Left    MULTIPLE TOOTH EXTRACTIONS     PATCH ANGIOPLASTY Right 03/23/2018   Procedure: WITH Rueben Bash 1CM X 6CM PATCH ANGIOPLASTY;  Surgeon: Waynetta Sandy, MD;  Location: [place];  Service: Vascular;  Laterality: Right;   SHOULDER ARTHROSCOPY Right 06/09/2017   Procedure: ARTHROSCOPY SHOULDER SUBACROMIAL DECOMPRESSION AND ACROMIOPLASTY;  Surgeon: Melrose Nakayama, MD;  Location: [place];  Service: Orthopedics;  Laterality: Right;  GENERAL ANESTHESIA WITH BLOCK       Family History  Problem Relation Age of Onset   Heart attack Father     Social History   Tobacco Use   Smoking status: Former    Packs/day: 1.00    Years: 5.00    Pack years: 5.00    Types: Cigarettes   Smokeless tobacco: Never   Tobacco comments:    quit smoking cigarettes > 40 years ago  Vaping Use   Vaping Use: Never used  Substance Use Topics   Alcohol use: No  Drug use: No    Home Medications Prior to Admission medications   Medication Sig Start Date End Date Taking? Authorizing Provider  albuterol (VENTOLIN HFA) 108 (90 Base) MCG/ACT inhaler Inhale 2 puffs into the lungs every 6 (six) hours as needed for wheezing or shortness of breath. 03/25/21   Brand Males, MD  apixaban (ELIQUIS) 5 MG TABS tablet Take 1 tablet (5 mg total) by mouth 2 (two) times daily. 06/03/21   Nahser, Wonda Cheng, MD  azithromycin (ZITHROMAX) 250 MG tablet Take two today and then one daily until finished. 03/31/21   Brand Males, MD  carvedilol (COREG) 12.5 MG tablet TAKE 1 TABLET BY MOUTH  TWICE DAILY 04/13/21   Nahser, Wonda Cheng, MD  clopidogrel (PLAVIX) 75 MG tablet TAKE 1 TABLET BY MOUTH  DAILY 01/27/21   Nahser, Wonda Cheng,  MD  diltiazem (CARDIZEM) 120 MG tablet Take 1 capsule by mouth daily.    [provider]  furosemide (LASIX) 40 MG tablet Take 40 mg by mouth daily.    [provider]  metFORMIN (GLUCOPHAGE) 1000 MG tablet Take 1,000 mg by mouth 2 (two) times daily with a meal.    [provider]  nitroGLYCERIN (NITROSTAT) 0.4 MG SL tablet Place 1 tablet (0.4 mg total) under the tongue every 5 (five) minutes as needed for chest pain. 10/17/19   Nahser, Wonda Cheng, MD  potassium chloride (KLOR-CON) 10 MEQ tablet Take 10 mEq by mouth daily. As needed 01/29/21   [provider]  rosuvastatin (CRESTOR) 10 MG tablet TAKE 1 TABLET BY MOUTH  DAILY 10/13/20   Nahser, Wonda Cheng, MD  tamsulosin (FLOMAX) 0.4 MG CAPS capsule Take 0.4 mg by mouth at bedtime.  04/07/20   [provider]  Tiotropium Bromide Monohydrate (SPIRIVA RESPIMAT) 2.5 MCG/ACT AERS Inhale 2 puffs into the lungs daily. 03/25/21   Brand Males, MD  Tiotropium Bromide Monohydrate (SPIRIVA RESPIMAT) 2.5 MCG/ACT AERS Inhale 2 puffs into the lungs daily. 04/21/21   Spero Geralds, MD  triamcinolone cream (KENALOG) 0.1 % Apply 1 application topically as needed. 07/22/20   [provider]    Allergies    Patient has no known allergies.  Review of Systems   Review of Systems  All other systems reviewed and are negative.  Physical Exam Updated Vital Signs BP (!) 172/88 (BP Location: Right Arm)   Pulse 75   Temp 98.2 F (36.8 C) (Oral)   Resp 20   Ht 1.727 m (5\' 8" )   Wt 72.6 kg   SpO2 96%   BMI 24.33 kg/m   Physical Exam Vitals and nursing note reviewed.  Constitutional:      General: He is not in acute distress.    Appearance: Normal appearance. He is well-developed. He is not toxic-appearing.  HENT:     Head: Normocephalic and atraumatic.  Eyes:     General: Lids are normal.     Conjunctiva/sclera: Conjunctivae normal.     Pupils: Pupils are equal, round, and reactive to light.  Neck:      Thyroid: No thyroid mass.     Trachea: No tracheal deviation.  Cardiovascular:     Rate and Rhythm: Normal rate and regular rhythm.     Heart sounds: Normal heart sounds. No murmur heard.   No gallop.  Pulmonary:     Effort: Pulmonary effort is normal. No respiratory distress.     Breath sounds: No stridor. Examination of the right-lower field reveals decreased breath sounds and rhonchi. Examination  of the left-lower field reveals decreased breath sounds and rhonchi. Decreased breath sounds and rhonchi present. No wheezing or rales.  Abdominal:     General: There is no distension.     Palpations: Abdomen is soft.     Tenderness: There is no abdominal tenderness. There is no rebound.  Musculoskeletal:        General: No tenderness. Normal range of motion.     Cervical back: Normal range of motion and neck supple.  Skin:    General: Skin is warm and dry.     Findings: No abrasion or rash.  Neurological:     Mental Status: He is alert and oriented to person, place, and time. Mental status is at baseline.     GCS: GCS eye subscore is 4. GCS verbal subscore is 5. GCS motor subscore is 6.     Cranial Nerves: Cranial nerves are intact. No cranial nerve deficit.     Sensory: No sensory deficit.     Motor: Motor function is intact.  Psychiatric:        Attention and Perception: Attention normal.        Speech: Speech normal.        Behavior: Behavior normal.    ED Results / Procedures / Treatments   Labs (all labs ordered are listed, but only abnormal results are displayed) Labs Reviewed  RESP PANEL BY RT-PCR (FLU A&B, COVID) ARPGX2  CBC WITH DIFFERENTIAL/PLATELET  BASIC METABOLIC PANEL  BRAIN NATRIURETIC PEPTIDE  TROPONIN I (HIGH SENSITIVITY)    EKG EKG Interpretation  Date/Time:  Sunday September 27 2021 14:13:35 EDT Ventricular Rate:  93 PR Interval:    QRS Duration: 102 QT Interval:  377 QTC Calculation: 469 R Axis:   65 Text Interpretation: Atrial fibrillation  Nonspecific T abnormalities, lateral leads Confirmed by Lacretia Leigh ([id number]) on 09/27/2021 2:16:54 PM  Radiology No results found.  Procedures Procedures   Medications Ordered in ED Medications  albuterol (VENTOLIN HFA) 108 (90 Base) MCG/ACT inhaler 2 puff (has no administration in time range)  0.9 %  sodium chloride infusion ( Intravenous Not Given 09/27/21 1415)    ED Course  I have reviewed the triage vital signs and the nursing notes.  Pertinent labs & imaging results that were available during my care of the patient were reviewed by me and considered in my medical decision making (see chart for details).    MDM Rules/Calculators/A&P                           Patient's clinical situation consistent with CHF.  He does have elevated BNP.  COVID test negative.  Given Lasix 40 mg IV push.  Will require admission Final Clinical Impression(s) / ED Diagnoses Final diagnoses:  None    Rx / DC Orders ED Discharge Orders     None        Lacretia Leigh, MD 09/27/21 1520

## 2021-09-27 NOTE — H&P (Signed)
History and Physical    Todd Armstrong:[id number] DOB: [birth year] DOA: 09/27/2021  PCP: Lavone Orn, MD  Patient coming from: Home  I have personally briefly reviewed patient's old medical records in [place]  Chief Complaint: Shortness of breath  HPI: Todd Armstrong is a 87 y.o. male with medical history significant of coronary artery disease, carotid artery disease, diabetes, hypertension, hyperlipidemia, permanent atrial fibrillation on anticoagulation, pulmonary hypertension, is brought to the emergency room today with complaints of shortness of breath.  From patient's description, it appears that his symptoms are more longstanding and have progressively gotten worse.  Describes having orthopnea for the last 8 months.  He has had dyspnea on exertion for the last several weeks to months.  Denies any chest pain.  He has not had any palpitations.  He says he can only sleep a few hours in his bed until he is to wake up and moved to the recliner due to shortness of breath.  He says he can only walk 50 feet now without getting short of breath.  He does describe chronic leg swelling.  He has been told to only take his Lasix on an as-needed basis and therefore has not taken any Lasix in approximately 10 days.  Today, he had gone to church to attend service.  Afterwards while he was walking, he became increasingly short of breath.  Fellow churchgoer's noticed his increasing shortness of breath and directed him to come to the emergency room for evaluation.  ED Course: Chest x-ray indicated CHF.  BNP mildly elevated.  Oxygen saturations were normal on room air.  He was noted to have signs of volume overload.  He received a dose of intravenous Lasix and has put out approximately 1300 cc of urine.  Still feels short of breath.  Review of Systems: As per HPI otherwise 10 point review of systems negative.    Past Medical History:  Diagnosis Date   Acute myocardial infarction    Carotid  stenosis    Coronary artery disease    PTCA and stenting of his right coronary artery. 3.5 x 16-mm Liberte stent.               Diabetes mellitus without complication ([place])    Dyslipidemia    Hyperlipemia    Hypertension    Partial tear of rotator cuff    right shoulder   Peripheral vascular disease ([place])    Pulmonary hypertension (HCC)    moderate pulmonary HTN by 2007 echo (RVSP 52 mmHg)   Stroke [facility])    Wears dentures     Past Surgical History:  Procedure Laterality Date   CARDIAC CATHETERIZATION  11/05/2008   CARDIAC CATHETERIZATION  10/22/1993   EF 60%   CARDIOVERSION N/A 06/05/2020   Procedure: CARDIOVERSION;  Surgeon: [place] Headings, MD;  Location: [facility] ENDOSCOPY;  Service: Cardiovascular;  Laterality: N/A;   CAROTID ENDARTERECTOMY Right 03/23/2018   CORONARY ANGIOPLASTY WITH STENT PLACEMENT     ENDARTERECTOMY Right 03/23/2018   Procedure: RIGHT CAROTID ARTERY ENDARTERECTOMY;  Surgeon: Waynetta Sandy, MD;  Location: [place];  Service: Vascular;  Laterality: Right;   FRACTURE SURGERY     righ fibula and tibia   HERNIA REPAIR  1984   KNEE ARTHROSCOPY Left    MULTIPLE TOOTH EXTRACTIONS     PATCH ANGIOPLASTY Right 03/23/2018   Procedure: WITH Rueben Bash 1CM X 6CM PATCH ANGIOPLASTY;  Surgeon: Waynetta Sandy, MD;  Location: [place];  Service: Vascular;  Laterality:  Right;   SHOULDER ARTHROSCOPY Right 06/09/2017   Procedure: ARTHROSCOPY SHOULDER SUBACROMIAL DECOMPRESSION AND ACROMIOPLASTY;  Surgeon: Melrose Nakayama, MD;  Location: [place];  Service: Orthopedics;  Laterality: Right;  GENERAL ANESTHESIA WITH BLOCK    Social History:  reports that he has quit smoking. His smoking use included cigarettes. He has a 5.00 pack-year smoking history. He has never used smokeless tobacco. He reports that he does not drink alcohol and does not use drugs.  No Known Allergies  Family History  Problem Relation Age of Onset   Heart attack Father      Prior to Admission  medications   Medication Sig Start Date End Date Taking? Authorizing Provider  albuterol (VENTOLIN HFA) 108 (90 Base) MCG/ACT inhaler Inhale 2 puffs into the lungs every 6 (six) hours as needed for wheezing or shortness of breath. 03/25/21  Yes Brand Males, MD  apixaban (ELIQUIS) 5 MG TABS tablet Take 1 tablet (5 mg total) by mouth 2 (two) times daily. 06/03/21  Yes Nahser, Wonda Cheng, MD  clopidogrel (PLAVIX) 75 MG tablet TAKE 1 TABLET BY MOUTH  DAILY Patient taking differently: Take 75 mg by mouth in the morning. 01/27/21  Yes Nahser, Wonda Cheng, MD  nitroGLYCERIN (NITROSTAT) 0.4 MG SL tablet Place 1 tablet (0.4 mg total) under the tongue every 5 (five) minutes as needed for chest pain. 10/17/19  Yes Nahser, Wonda Cheng, MD  Tiotropium Bromide Monohydrate (SPIRIVA RESPIMAT) 2.5 MCG/ACT AERS Inhale 2 puffs into the lungs daily. 04/21/21  Yes Spero Geralds, MD  carvedilol (COREG) 12.5 MG tablet TAKE 1 TABLET BY MOUTH  TWICE DAILY 04/13/21   Nahser, Wonda Cheng, MD  carvedilol (COREG) 6.25 MG tablet Take 6.25 mg by mouth 2 (two) times daily with a meal.    [provider]  diltiazem (CARDIZEM) 120 MG tablet Take 120 mg by mouth daily.    [provider]  furosemide (LASIX) 40 MG tablet Take 40 mg by mouth daily.    [provider]  metFORMIN (GLUCOPHAGE) 1000 MG tablet Take 1,000 mg by mouth 2 (two) times daily with a meal.    [provider]  potassium chloride (KLOR-CON) 10 MEQ tablet Take 10 mEq by mouth daily. As needed 01/29/21   [provider]  rosuvastatin (CRESTOR) 10 MG tablet TAKE 1 TABLET BY MOUTH  DAILY Patient taking differently: Take 10 mg by mouth daily. 10/13/20   Nahser, Wonda Cheng, MD  tamsulosin (FLOMAX) 0.4 MG CAPS capsule Take 0.4 mg by mouth at bedtime.  04/07/20   [provider]  Tiotropium Bromide Monohydrate (SPIRIVA RESPIMAT) 2.5 MCG/ACT AERS Inhale 2 puffs into the lungs daily. 03/25/21   Brand Males, MD  triamcinolone cream  (KENALOG) 0.1 % Apply 1 application topically as needed (for itching). 07/22/20   [provider]    Physical Exam: Vitals:   09/27/21 1600 09/27/21 1615 09/27/21 1739 09/27/21 1808  BP: (!) 168/140 (!) 170/99 (!) 159/82   Pulse: (!) 120 (!) 105 98   Resp: (!) 24 19 18    Temp:   97.6 F (36.4 C)   TempSrc:   Oral   SpO2: 97% 98% 96%   Weight:    66.3 kg  Height:    5\' 8"  (1.727 m)    Constitutional: NAD, calm, comfortable Eyes: PERRL, lids and conjunctivae normal ENMT: Mucous membranes are moist. Posterior pharynx clear of any exudate or lesions.Normal dentition.  Neck: normal, supple, no masses, no thyromegaly Respiratory: Bilateral crackles. Normal respiratory effort.  No accessory muscle use.  Cardiovascular: Irregularly irregular, no murmurs / rubs / gallops.  2+ lower extremity edema, left greater than right. 2+ pedal pulses. No carotid bruits.  Abdomen: no tenderness, no masses palpated. No hepatosplenomegaly. Bowel sounds positive.  Musculoskeletal: no clubbing / cyanosis. No joint deformity upper and lower extremities. Good ROM, no contractures. Normal muscle tone.  Skin: no rashes, lesions, ulcers. No induration Neurologic: CN 2-12 grossly intact. Sensation intact, DTR normal. Strength 5/5 in all 4.  Psychiatric: Normal judgment and insight. Alert and oriented x 3. Normal mood.    Labs on Admission: I have personally reviewed following labs and imaging studies  CBC: Recent Labs  Lab 09/27/21 1411  WBC 6.5  NEUTROABS 4.4  HGB 10.2*  HCT 32.8*  MCV 96.8  PLT 060*   Basic Metabolic Panel: Recent Labs  Lab 09/27/21 1411  NA 133*  K 4.4  CL 104  CO2 21*  GLUCOSE 125*  BUN 18  CREATININE 1.09  CALCIUM 8.7*   GFR: Estimated Creatinine Clearance: 43.9 mL/min (by C-G formula based on SCr of 1.09 mg/dL). Liver Function Tests: No results for input(s): AST, ALT, ALKPHOS, BILITOT, PROT, ALBUMIN in the last 168 hours. No results for input(s): LIPASE,  AMYLASE in the last 168 hours. No results for input(s): AMMONIA in the last 168 hours. Coagulation Profile: No results for input(s): INR, PROTIME in the last 168 hours. Cardiac Enzymes: No results for input(s): CKTOTAL, CKMB, CKMBINDEX, TROPONINI in the last 168 hours. BNP (last 3 results) No results for input(s): PROBNP in the last 8760 hours. HbA1C: No results for input(s): HGBA1C in the last 72 hours. CBG: No results for input(s): GLUCAP in the last 168 hours. Lipid Profile: No results for input(s): CHOL, HDL, LDLCALC, TRIG, CHOLHDL, LDLDIRECT in the last 72 hours. Thyroid Function Tests: No results for input(s): TSH, T4TOTAL, FREET4, T3FREE, THYROIDAB in the last 72 hours. Anemia Panel: No results for input(s): VITAMINB12, FOLATE, FERRITIN, TIBC, IRON, RETICCTPCT in the last 72 hours. Urine analysis:    Component Value Date/Time   COLORURINE YELLOW 03/17/2018 1420   APPEARANCEUR CLEAR 03/17/2018 1420   LABSPEC 1.014 03/17/2018 1420   PHURINE 5.0 03/17/2018 1420   GLUCOSEU NEGATIVE 03/17/2018 1420   HGBUR SMALL (A) 03/17/2018 1420   BILIRUBINUR NEGATIVE 03/17/2018 1420   KETONESUR NEGATIVE 03/17/2018 1420   PROTEINUR NEGATIVE 03/17/2018 1420   NITRITE NEGATIVE 03/17/2018 1420   LEUKOCYTESUR NEGATIVE 03/17/2018 1420    Radiological Exams on Admission: DG Chest 2 View  Result Date: 09/27/2021 CLINICAL DATA:  Shortness of breath EXAM: CHEST - 2 VIEW COMPARISON:  01/26/2021 FINDINGS: Small right pleural effusion. Right base atelectasis. Mild hyperinflation of the lungs. Heart is normal size. No acute bony abnormality. IMPRESSION: Small right pleural effusion with right base atelectasis. Electronically Signed   By: Rolm Baptise M.D.   On: 09/27/2021 15:03    EKG: Independently reviewed.  Atrial fibrillation, no acute changes  Assessment/Plan Active Problems:   Hypertension   Hyperlipemia   Persistent atrial fibrillation (HCC)   Pulmonary HTN (HCC)   CHF exacerbation  (HCC)   Emphysema/COPD (HCC)   Type 2 diabetes mellitus with hyperlipidemia (HCC)     Acute on chronic CHF, likely diastolic Pulmonary hypertension -We will repeat echocardiogram -Continue diuresis with intravenous Lasix -Monitor intake and output/daily weights -Continue on beta-blockers -We will likely need to stay on daily/scheduled Lasix -Since he does have more swelling in his left lower extremity, will check venous ultrasound  Emphysema -Chronically  on Spiriva -We will continue on inhaled steroids and bronchodilators -Does not have any wheezing at this time  Persistent atrial fibrillation -Continue rate control with metoprolol and diltiazem -He is anticoagulated with apixaban  Type 2 diabetes -Holding oral agents -Checking A1c -Continue on sliding scale insulin  Hyperlipidemia -Continue statin  DVT prophylaxis: Apixaban Code Status: DNR Family Communication: Offered to call patient's family, but he said he would update them himself Disposition Plan: Discharge home once respiratory status has improved Consults called:   Admission status: Observation, telemetry  Kathie Dike MD [facility]   If 7PM-7AM, please contact night-coverage [email or web address]   09/27/2021, 6:49 PM

## 2021-09-27 NOTE — ED Triage Notes (Signed)
[place] complaining of shortness of breath for the past few days.  Received 500cc bolus NS.  Alert and oriented x 4. Hx afib

## 2021-09-28 ENCOUNTER — Observation Stay (HOSPITAL_COMMUNITY): Payer: Medicare Other

## 2021-09-28 DIAGNOSIS — Z20822 Contact with and (suspected) exposure to covid-19: Secondary | ICD-10-CM | POA: Diagnosis present

## 2021-09-28 DIAGNOSIS — I4819 Other persistent atrial fibrillation: Secondary | ICD-10-CM | POA: Diagnosis not present

## 2021-09-28 DIAGNOSIS — E782 Mixed hyperlipidemia: Secondary | ICD-10-CM | POA: Diagnosis not present

## 2021-09-28 DIAGNOSIS — R0602 Shortness of breath: Secondary | ICD-10-CM | POA: Diagnosis present

## 2021-09-28 DIAGNOSIS — I5023 Acute on chronic systolic (congestive) heart failure: Secondary | ICD-10-CM | POA: Diagnosis not present

## 2021-09-28 DIAGNOSIS — E1169 Type 2 diabetes mellitus with other specified complication: Secondary | ICD-10-CM | POA: Diagnosis present

## 2021-09-28 DIAGNOSIS — Z79899 Other long term (current) drug therapy: Secondary | ICD-10-CM | POA: Diagnosis not present

## 2021-09-28 DIAGNOSIS — Z66 Do not resuscitate: Secondary | ICD-10-CM | POA: Diagnosis present

## 2021-09-28 DIAGNOSIS — Z8616 Personal history of COVID-19: Secondary | ICD-10-CM | POA: Diagnosis not present

## 2021-09-28 DIAGNOSIS — Z8249 Family history of ischemic heart disease and other diseases of the circulatory system: Secondary | ICD-10-CM | POA: Diagnosis not present

## 2021-09-28 DIAGNOSIS — Z955 Presence of coronary angioplasty implant and graft: Secondary | ICD-10-CM | POA: Diagnosis not present

## 2021-09-28 DIAGNOSIS — I130 Hypertensive heart and chronic kidney disease with heart failure and stage 1 through stage 4 chronic kidney disease, or unspecified chronic kidney disease: Secondary | ICD-10-CM | POA: Diagnosis present

## 2021-09-28 DIAGNOSIS — E1122 Type 2 diabetes mellitus with diabetic chronic kidney disease: Secondary | ICD-10-CM | POA: Diagnosis present

## 2021-09-28 DIAGNOSIS — M7989 Other specified soft tissue disorders: Secondary | ICD-10-CM

## 2021-09-28 DIAGNOSIS — Z8673 Personal history of transient ischemic attack (TIA), and cerebral infarction without residual deficits: Secondary | ICD-10-CM | POA: Diagnosis not present

## 2021-09-28 DIAGNOSIS — Z7984 Long term (current) use of oral hypoglycemic drugs: Secondary | ICD-10-CM | POA: Diagnosis not present

## 2021-09-28 DIAGNOSIS — E1151 Type 2 diabetes mellitus with diabetic peripheral angiopathy without gangrene: Secondary | ICD-10-CM | POA: Diagnosis present

## 2021-09-28 DIAGNOSIS — I5043 Acute on chronic combined systolic (congestive) and diastolic (congestive) heart failure: Secondary | ICD-10-CM | POA: Diagnosis present

## 2021-09-28 DIAGNOSIS — Z87891 Personal history of nicotine dependence: Secondary | ICD-10-CM | POA: Diagnosis not present

## 2021-09-28 DIAGNOSIS — I2510 Atherosclerotic heart disease of native coronary artery without angina pectoris: Secondary | ICD-10-CM | POA: Diagnosis present

## 2021-09-28 DIAGNOSIS — J431 Panlobular emphysema: Secondary | ICD-10-CM | POA: Diagnosis not present

## 2021-09-28 DIAGNOSIS — I5033 Acute on chronic diastolic (congestive) heart failure: Secondary | ICD-10-CM

## 2021-09-28 DIAGNOSIS — E785 Hyperlipidemia, unspecified: Secondary | ICD-10-CM | POA: Diagnosis present

## 2021-09-28 DIAGNOSIS — I4821 Permanent atrial fibrillation: Secondary | ICD-10-CM | POA: Diagnosis present

## 2021-09-28 DIAGNOSIS — J439 Emphysema, unspecified: Secondary | ICD-10-CM | POA: Diagnosis present

## 2021-09-28 DIAGNOSIS — I10 Essential (primary) hypertension: Secondary | ICD-10-CM | POA: Diagnosis not present

## 2021-09-28 DIAGNOSIS — N1831 Chronic kidney disease, stage 3a: Secondary | ICD-10-CM | POA: Diagnosis present

## 2021-09-28 DIAGNOSIS — Z7901 Long term (current) use of anticoagulants: Secondary | ICD-10-CM | POA: Diagnosis not present

## 2021-09-28 DIAGNOSIS — I252 Old myocardial infarction: Secondary | ICD-10-CM | POA: Diagnosis not present

## 2021-09-28 DIAGNOSIS — I272 Pulmonary hypertension, unspecified: Secondary | ICD-10-CM | POA: Diagnosis present

## 2021-09-28 DIAGNOSIS — N179 Acute kidney failure, unspecified: Secondary | ICD-10-CM | POA: Diagnosis present

## 2021-09-28 LAB — ECHOCARDIOGRAM COMPLETE
Calc EF: 49.9 %
Height: 68 in
MV M vel: 5.6 m/s
MV Peak grad: 125.4 mmHg
P 1/2 time: 447 msec
Radius: 0.3 cm
S' Lateral: 3.1 cm
Single Plane A2C EF: 54.3 %
Single Plane A4C EF: 45.7 %
Weight: 2211.2 oz

## 2021-09-28 LAB — POTASSIUM: Potassium: 4 mmol/L (ref 3.5–5.1)

## 2021-09-28 LAB — BASIC METABOLIC PANEL
Anion gap: 12 (ref 5–15)
BUN: 22 mg/dL (ref 8–23)
CO2: 28 mmol/L (ref 22–32)
Calcium: 9 mg/dL (ref 8.9–10.3)
Chloride: 97 mmol/L — ABNORMAL LOW (ref 98–111)
Creatinine, Ser: 1.27 mg/dL — ABNORMAL HIGH (ref 0.61–1.24)
GFR, Estimated: 54 mL/min — ABNORMAL LOW (ref >60)
Glucose, Bld: 114 mg/dL — ABNORMAL HIGH (ref 70–99)
Potassium: 3.3 mmol/L — ABNORMAL LOW (ref 3.5–5.1)
Sodium: 137 mmol/L (ref 135–145)

## 2021-09-28 LAB — HEMOGLOBIN A1C
Hgb A1c MFr Bld: 6.3 % — ABNORMAL HIGH (ref 4.8–5.6)
Mean Plasma Glucose: 134.11 mg/dL

## 2021-09-28 LAB — CBC
HCT: 33 % — ABNORMAL LOW (ref 39.0–52.0)
Hemoglobin: 10.4 g/dL — ABNORMAL LOW (ref 13.0–17.0)
MCH: 29.9 pg (ref 26.0–34.0)
MCHC: 31.5 g/dL (ref 30.0–36.0)
MCV: 94.8 fL (ref 80.0–100.0)
Platelets: 151 10*3/uL (ref 150–400)
RBC: 3.48 MIL/uL — ABNORMAL LOW (ref 4.22–5.81)
RDW: 16.1 % — ABNORMAL HIGH (ref 11.5–15.5)
WBC: 6.3 10*3/uL (ref 4.0–10.5)
nRBC: 0 % (ref 0.0–0.2)

## 2021-09-28 LAB — GLUCOSE, CAPILLARY
Glucose-Capillary: 114 mg/dL — ABNORMAL HIGH (ref 70–99)
Glucose-Capillary: 268 mg/dL — ABNORMAL HIGH (ref 70–99)
Glucose-Capillary: 109 mg/dL — ABNORMAL HIGH (ref 70–99)
Glucose-Capillary: 207 mg/dL — ABNORMAL HIGH (ref 70–99)

## 2021-09-28 LAB — PHOSPHORUS: Phosphorus: 4.7 mg/dL — ABNORMAL HIGH (ref 2.5–4.6)

## 2021-09-28 LAB — MAGNESIUM: Magnesium: 1.9 mg/dL (ref 1.7–2.4)

## 2021-09-28 LAB — TROPONIN I (HIGH SENSITIVITY): Troponin I (High Sensitivity): 28 ng/L — ABNORMAL HIGH (ref <18)

## 2021-09-28 LAB — TSH: TSH: 1.377 u[IU]/mL (ref 0.350–4.500)

## 2021-09-28 MED ORDER — CARVEDILOL 3.125 MG PO TABS
3.1250 mg | ORAL_TABLET | Freq: Two times a day (BID) | ORAL | Status: DC
  Administered 2021-09-29: 3.125 mg via ORAL
  Filled 2021-09-28: qty 1

## 2021-09-28 MED ORDER — POTASSIUM CHLORIDE CRYS ER 20 MEQ PO TBCR
20.0000 meq | EXTENDED_RELEASE_TABLET | Freq: Once | ORAL | Status: AC
  Administered 2021-09-28: 20 meq via ORAL
  Filled 2021-09-28: qty 1

## 2021-09-28 MED ORDER — MAGNESIUM SULFATE 2 GM/50ML IV SOLN
2.0000 g | Freq: Once | INTRAVENOUS | Status: AC
  Administered 2021-09-28: 2 g via INTRAVENOUS
  Filled 2021-09-28: qty 50

## 2021-09-28 MED ORDER — POTASSIUM CHLORIDE CRYS ER 20 MEQ PO TBCR
40.0000 meq | EXTENDED_RELEASE_TABLET | Freq: Once | ORAL | Status: AC
  Administered 2021-09-28: 40 meq via ORAL
  Filled 2021-09-28: qty 2

## 2021-09-28 NOTE — Progress Notes (Signed)
LLE venous duplex has been completed.  Results can be found under chart review under CV PROC. 09/28/2021 12:16 PM Mordecai Tindol RVT, RDMS

## 2021-09-28 NOTE — Progress Notes (Signed)
PROGRESS NOTE    Todd Armstrong  QVZ:[id number] DOB: 1931-10-26 DOA: 09/27/2021 PCP: Lavone Orn, MD    Brief Narrative:  87 year old male admitted to the hospital with progressive dyspnea on exertion and orthopnea.  Found to have decompensated CHF.  He was admitted for IV diuresis.   Assessment & Plan:   Active Problems:   Hypertension   Hyperlipemia   Persistent atrial fibrillation ([place])   Pulmonary HTN (HCC)   CHF exacerbation (HCC)   Emphysema/COPD (HCC)   Type 2 diabetes mellitus with hyperlipidemia (HCC)   Acute on chronic CHF, likely diastolic Pulmonary hypertension -Echocardiogram has been updated and indicates a mild decline in EF since 04/2021.  Current EF noted to be 45 to 50% -He has had good urine output with IV Lasix, but still does have some lower extremity edema -Continue diuresis with intravenous Lasix -Monitor intake and output/daily weights -Continue on beta-blockers -He will likely need to stay on daily/scheduled Lasix as an outpatient -Venous Dopplers of left lower extremity negative for DVT   Emphysema -Chronically on Spiriva -We will continue on inhaled steroids and bronchodilators -Does not have any wheezing at this time   Persistent atrial fibrillation -He was continued on his home dose of diltiazem and Coreg -Patient had relatively sudden development of bradycardia today around noon.  Heart rate in the high 40s to low 50s.  EKG shows slow A. fib versus junctional rhythm.  Patient appears to be asymptomatic, no chest pain or shortness of breath -Diltiazem has been discontinued -Coreg dose reduced from 12.5mg  to 3.125 mg with holding parameters -We will request cardiology input.   Type 2 diabetes -Holding oral agents -A1c 6.3 -Continue on sliding scale insulin   Hyperlipidemia -Continue statin   DVT prophylaxis:  apixaban (ELIQUIS) tablet 5 mg  Code Status: DNR Family Communication: Discussed with patient Disposition Plan: Status  is: Inpatient  Remains inpatient appropriate because: Continue diuresis and further input from cardiology.  We will need to monitor heart rate for stability.    Consultants:  Cardiology  Procedures:  Echocardiogram  Antimicrobials:      Subjective: He is feeling better.  He has not had any chest pain.  Feels that shortness of breath has improved.  Able to ambulate in the hall without shortness of breath.  Staff notes that he did become bradycardic into the high 40s and low 50s this afternoon.  He still does have some lower extremity edema.  Objective: Vitals:   09/28/21 1115 09/28/21 1150 09/28/21 1307 09/28/21 1655  BP:  (!) 102/54 (!) 104/46   Pulse:  64 (!) 43 (!) 46  Resp:  18 16   Temp:  98 F (36.7 C) 97.7 F (36.5 C)   TempSrc:  Oral Oral   SpO2: 98% 98% 98%   Weight:      Height:        Intake/Output Summary (Last 24 hours) at 09/28/2021 1911 Last data filed at 09/28/2021 1634 Gross per 24 hour  Intake 840 ml  Output 3195 ml  Net -2355 ml   Filed Weights   09/27/21 1404 09/27/21 1808 09/28/21 0434  Weight: 72.6 kg 66.3 kg 62.7 kg    Examination:  General exam: Appears calm and comfortable  Respiratory system: Crackles at bases. Respiratory effort normal. Cardiovascular system: S1 & S2 heard, irregular. No JVD, murmurs, rubs, gallops or clicks.  1+ pedal edema, left greater than right Gastrointestinal system: Abdomen is nondistended, soft and nontender. No organomegaly or masses felt. Normal bowel  sounds heard. Central nervous system: Alert and oriented. No focal neurological deficits. Extremities: Symmetric 5 x 5 power. Skin: No rashes, lesions or ulcers Psychiatry: Judgement and insight appear normal. Mood & affect appropriate.     Data Reviewed: I have personally reviewed following labs and imaging studies  CBC: Recent Labs  Lab 09/27/21 1411 09/28/21 0400  WBC 6.5 6.3  NEUTROABS 4.4  --   HGB 10.2* 10.4*  HCT 32.8* 33.0*  MCV 96.8 94.8   PLT 149* 916   Basic Metabolic Panel: Recent Labs  Lab 09/27/21 1411 09/28/21 0400 09/28/21 0429  NA 133* 137  --   K 4.4 3.3* 4.0  CL 104 97*  --   CO2 21* 28  --   GLUCOSE 125* 114*  --   BUN 18 22  --   CREATININE 1.09 1.27*  --   CALCIUM 8.7* 9.0  --   MG  --  1.9  --   PHOS  --  4.7*  --    GFR: Estimated Creatinine Clearance: 35.7 mL/min (A) (by C-G formula based on SCr of 1.27 mg/dL (H)). Liver Function Tests: No results for input(s): AST, ALT, ALKPHOS, BILITOT, PROT, ALBUMIN in the last 168 hours. No results for input(s): LIPASE, AMYLASE in the last 168 hours. No results for input(s): AMMONIA in the last 168 hours. Coagulation Profile: No results for input(s): INR, PROTIME in the last 168 hours. Cardiac Enzymes: No results for input(s): CKTOTAL, CKMB, CKMBINDEX, TROPONINI in the last 168 hours. BNP (last 3 results) No results for input(s): PROBNP in the last 8760 hours. HbA1C: Recent Labs    09/28/21 0400  HGBA1C 6.3*   CBG: Recent Labs  Lab 09/27/21 2130 09/28/21 0729 09/28/21 1057 09/28/21 1608  GLUCAP 129* 114* 268* 109*   Lipid Profile: No results for input(s): CHOL, HDL, LDLCALC, TRIG, CHOLHDL, LDLDIRECT in the last 72 hours. Thyroid Function Tests: Recent Labs    09/28/21 1453  TSH 1.377   Anemia Panel: No results for input(s): VITAMINB12, FOLATE, FERRITIN, TIBC, IRON, RETICCTPCT in the last 72 hours. Sepsis Labs: No results for input(s): PROCALCITON, LATICACIDVEN in the last 168 hours.  Recent Results (from the past 240 hour(s))  Resp Panel by RT-PCR (Flu A&B, Covid) Nasopharyngeal Swab     Status: None   Collection Time: 09/27/21  2:17 PM   Specimen: Nasopharyngeal Swab; Nasopharyngeal(NP) swabs in vial transport medium  Result Value Ref Range Status   SARS Coronavirus 2 by RT PCR NEGATIVE NEGATIVE Final    Comment: (NOTE) SARS-CoV-2 target nucleic acids are NOT DETECTED.  The SARS-CoV-2 RNA is generally detectable in upper  respiratory specimens during the acute phase of infection. The lowest concentration of SARS-CoV-2 viral copies this assay can detect is 138 copies/mL. A negative result does not preclude SARS-Cov-2 infection and should not be used as the sole basis for treatment or other patient management decisions. A negative result may occur with  improper specimen collection/handling, submission of specimen other than nasopharyngeal swab, presence of viral mutation(s) within the areas targeted by this assay, and inadequate number of viral copies(<138 copies/mL). A negative result must be combined with clinical observations, patient history, and epidemiological information. The expected result is Negative.  Fact Sheet for Patients:  EntrepreneurPulse.com.au  Fact Sheet for Healthcare Providers:  IncredibleEmployment.be  This test is no t yet approved or cleared by the [place] FDA and  has been authorized for detection and/or diagnosis of SARS-CoV-2 by FDA under an Emergency Use Authorization (EUA).  This EUA will remain  in effect (meaning this test can be used) for the duration of the COVID-19 declaration under Section 564(b)(1) of the Act, 21 U.S.C.section 360bbb-3(b)(1), unless the authorization is terminated  or revoked sooner.       Influenza A by PCR NEGATIVE NEGATIVE Final   Influenza B by PCR NEGATIVE NEGATIVE Final    Comment: (NOTE) The Xpert Xpress SARS-CoV-2/FLU/RSV plus assay is intended as an aid in the diagnosis of influenza from Nasopharyngeal swab specimens and should not be used as a sole basis for treatment. Nasal washings and aspirates are unacceptable for Xpert Xpress SARS-CoV-2/FLU/RSV testing.  Fact Sheet for Patients: EntrepreneurPulse.com.au  Fact Sheet for Healthcare Providers: IncredibleEmployment.be  This test is not yet approved or cleared by the [place] FDA and has been  authorized for detection and/or diagnosis of SARS-CoV-2 by FDA under an Emergency Use Authorization (EUA). This EUA will remain in effect (meaning this test can be used) for the duration of the COVID-19 declaration under Section 564(b)(1) of the Act, 21 U.S.C. section 360bbb-3(b)(1), unless the authorization is terminated or revoked.  Performed at [facility], [place] [address] [place], [place] [address]          Radiology Studies: DG Chest 2 View  Result Date: 09/27/2021 CLINICAL DATA:  Shortness of breath EXAM: CHEST - 2 VIEW COMPARISON:  01/26/2021 FINDINGS: Small right pleural effusion. Right base atelectasis. Mild hyperinflation of the lungs. Heart is normal size. No acute bony abnormality. IMPRESSION: Small right pleural effusion with right base atelectasis. Electronically Signed   By: Rolm Baptise M.D.   On: 09/27/2021 15:03   ECHOCARDIOGRAM COMPLETE  Result Date: 09/28/2021    ECHOCARDIOGRAM REPORT   Patient Name:   Todd Armstrong Date of Exam: 09/28/2021 Medical Rec #:  [id number]         Height:       68.0 in Accession #:    [id number]        Weight:       138.2 lb Date of Birth:  24-Nov-1931         BSA:          1.747 m Patient Age:    47 years          BP:           110/75 mmHg Patient Gender: M                 HR:           100 bpm. Exam Location:  Inpatient Procedure: 2D Echo, Cardiac Doppler and Color Doppler Indications:    Congestive Heart Failure I50.9  History:        Patient has prior history of Echocardiogram examinations, most                 recent 05/08/2021. CAD and Previous Myocardial Infarction,                 Pulmonary HTN and Stroke; Risk Factors:Hypertension,                 Dyslipidemia and Diabetes.  Sonographer:    Bernadene Person RDCS Referring Phys: [place]  1. Left ventricular ejection fraction, by estimation, is 45 to 50%. The left ventricle has mildly decreased function. The left ventricle demonstrates global  hypokinesis. Left ventricular diastolic parameters are indeterminate.  2. Right ventricular systolic function is normal. The right ventricular size is normal. There is moderately  elevated pulmonary artery systolic pressure. The estimated right ventricular systolic pressure is 29.5 mmHg.  3. Left atrial size was moderately dilated.  4. The mitral valve is normal in structure. Mild mitral valve regurgitation. No evidence of mitral stenosis.  5. The aortic valve is calcified. There is moderate calcification of the aortic valve. Aortic valve regurgitation is mild. Mild to moderate aortic valve sclerosis/calcification is present, without any evidence of aortic stenosis.  6. The inferior vena cava is normal in size with greater than 50% respiratory variability, suggesting right atrial pressure of 3 mmHg. FINDINGS  Left Ventricle: Left ventricular ejection fraction, by estimation, is 45 to 50%. The left ventricle has mildly decreased function. The left ventricle demonstrates global hypokinesis. The left ventricular internal cavity size was normal in size. There is  no left ventricular hypertrophy. Left ventricular diastolic parameters are indeterminate. Right Ventricle: The right ventricular size is normal. No increase in right ventricular wall thickness. Right ventricular systolic function is normal. There is moderately elevated pulmonary artery systolic pressure. The tricuspid regurgitant velocity is 3.57 m/s, and with an assumed right atrial pressure of 3 mmHg, the estimated right ventricular systolic pressure is 28.4 mmHg. Left Atrium: Left atrial size was moderately dilated. Right Atrium: Right atrial size was normal in size. Pericardium: There is no evidence of pericardial effusion. Mitral Valve: The mitral valve is normal in structure. Mild mitral valve regurgitation. No evidence of mitral valve stenosis. Tricuspid Valve: The tricuspid valve is normal in structure. Tricuspid valve regurgitation is mild . No evidence  of tricuspid stenosis. Aortic Valve: The aortic valve is calcified. There is moderate calcification of the aortic valve. Aortic valve regurgitation is mild. Aortic regurgitation PHT measures 447 msec. Mild to moderate aortic valve sclerosis/calcification is present, without any evidence of aortic stenosis. Pulmonic Valve: The pulmonic valve was normal in structure. Pulmonic valve regurgitation is trivial. No evidence of pulmonic stenosis. Aorta: The aortic root is normal in size and structure. Venous: The inferior vena cava is normal in size with greater than 50% respiratory variability, suggesting right atrial pressure of 3 mmHg. IAS/Shunts: No atrial level shunt detected by color flow Doppler.  LEFT VENTRICLE PLAX 2D LVIDd:         4.10 cm LVIDs:         3.10 cm LV PW:         1.00 cm LV IVS:        1.10 cm LVOT diam:     2.10 cm LV SV:         56 LV SV Index:   32 LVOT Area:     3.46 cm  LV Volumes (MOD) LV vol d, MOD A2C: 109.0 ml LV vol d, MOD A4C: 128.0 ml LV vol s, MOD A2C: 49.8 ml LV vol s, MOD A4C: 69.5 ml LV SV MOD A2C:     59.2 ml LV SV MOD A4C:     128.0 ml LV SV MOD BP:      60.8 ml RIGHT VENTRICLE TAPSE (M-mode): 1.4 cm LEFT ATRIUM             Index        RIGHT ATRIUM           Index LA diam:        3.00 cm 1.72 cm/m   RA Area:     14.30 cm LA Vol (A2C):   76.2 ml 43.63 ml/m  RA Volume:   31.90 ml  18.26 ml/m LA Vol (A4C):  65.9 ml 37.73 ml/m LA Biplane Vol: 74.0 ml 42.37 ml/m  AORTIC VALVE LVOT Vmax:   92.93 cm/s LVOT Vmean:  60.633 cm/s LVOT VTI:    0.160 m AI PHT:      447 msec  AORTA Ao Asc diam: 3.60 cm MR Peak grad:    125.4 mmHg   TRICUSPID VALVE MR Mean grad:    88.0 mmHg    TR Peak grad:   51.0 mmHg MR Vmax:         560.00 cm/s  TR Vmax:        357.00 cm/s MR Vmean:        448.0 cm/s MR PISA:         0.57 cm     SHUNTS MR PISA Eff ROA: 4 mm        Systemic VTI:  0.16 m MR PISA Radius:  0.30 cm      Systemic Diam: 2.10 cm Candee Furbish MD Electronically signed by Candee Furbish MD  Signature Date/Time: 09/28/2021/11:23:54 AM    Final    VAS [place] LOWER EXTREMITY VENOUS (DVT)  Result Date: 09/28/2021  Lower Venous DVT Study Patient Name:  Todd Armstrong  Date of Exam:   09/28/2021 Medical Rec #: [id number]          Accession #:    [id number] Date of Birth: 13-Dec-1931          Patient Gender: M Patient Age:   21 years Exam Location:  [facility] Procedure:      VAS [place] LOWER EXTREMITY VENOUS (DVT) Referring Phys: Jolaine Artist Willliam Pettet --------------------------------------------------------------------------------  Comparison Study: No previous exams Performing Technologist: Jody Hill RVT, RDMS  Examination Guidelines: A complete evaluation includes B-mode imaging, spectral Doppler, color Doppler, and power Doppler as needed of all accessible portions of each vessel. Bilateral testing is considered an integral part of a complete examination. Limited examinations for reoccurring indications may be performed as noted. The reflux portion of the exam is performed with the patient in reverse Trendelenburg.  +-----+---------------+---------+-----------+----------+--------------+ RIGHTCompressibilityPhasicitySpontaneityPropertiesThrombus Aging +-----+---------------+---------+-----------+----------+--------------+ CFV  Full           Yes      Yes                                 +-----+---------------+---------+-----------+----------+--------------+   +---------+---------------+---------+-----------+----------+--------------+ LEFT     CompressibilityPhasicitySpontaneityPropertiesThrombus Aging +---------+---------------+---------+-----------+----------+--------------+ CFV      Full           Yes      Yes                                 +---------+---------------+---------+-----------+----------+--------------+ SFJ      Full                                                        +---------+---------------+---------+-----------+----------+--------------+ FV Prox   Full           Yes      Yes                                 +---------+---------------+---------+-----------+----------+--------------+ FV Mid   Full  Yes      Yes                                 +---------+---------------+---------+-----------+----------+--------------+ FV DistalFull           Yes      Yes                                 +---------+---------------+---------+-----------+----------+--------------+ PFV      Full                                                        +---------+---------------+---------+-----------+----------+--------------+ POP      Full           Yes      Yes                                 +---------+---------------+---------+-----------+----------+--------------+ PTV      Full                                                        +---------+---------------+---------+-----------+----------+--------------+ PERO     Full                                                        +---------+---------------+---------+-----------+----------+--------------+     Summary: RIGHT: - No evidence of common femoral vein obstruction.  LEFT: - There is no evidence of deep vein thrombosis in the lower extremity. - There is no evidence of superficial venous thrombosis.  - No cystic structure found in the popliteal fossa.  *See table(s) above for measurements and observations. Electronically signed by Servando Snare MD on 09/28/2021 at 4:35:00 PM.    Final         Scheduled Meds:  apixaban  5 mg Oral BID   budesonide (PULMICORT) nebulizer solution  0.25 mg Nebulization BID   carvedilol  3.125 mg Oral BID WC   clopidogrel  75 mg Oral Daily   furosemide  40 mg Intravenous BID   insulin aspart  0-15 Units Subcutaneous TID WC   insulin aspart  0-5 Units Subcutaneous QHS   ipratropium-albuterol  3 mL Nebulization TID   rosuvastatin  10 mg Oral Daily   tamsulosin  0.4 mg Oral QHS   Continuous Infusions:  sodium chloride     magnesium  sulfate bolus IVPB 2 g (09/28/21 1818)     LOS: 0 days    Time spent: 31mins    Kathie Dike, MD [facility]   If 7PM-7AM, please contact night-coverage [email or web address]  09/28/2021, 7:11 PM

## 2021-09-28 NOTE — Plan of Care (Signed)
  Problem: Health Behavior/Discharge Planning: Goal: Ability to manage health-related needs will improve Outcome: Progressing   Problem: Clinical Measurements: Goal: Diagnostic test results will improve Outcome: Progressing   Problem: Activity: Goal: Risk for activity intolerance will decrease Outcome: Progressing   Problem: Cardiac: Goal: Ability to achieve and maintain adequate cardiopulmonary perfusion will improve Outcome: Progressing

## 2021-09-28 NOTE — Progress Notes (Signed)
  Echocardiogram 2D Echocardiogram has been performed.  Todd Armstrong 09/28/2021, 9:06 AM

## 2021-09-29 DIAGNOSIS — I4819 Other persistent atrial fibrillation: Secondary | ICD-10-CM

## 2021-09-29 DIAGNOSIS — I5043 Acute on chronic combined systolic (congestive) and diastolic (congestive) heart failure: Secondary | ICD-10-CM

## 2021-09-29 LAB — GLUCOSE, CAPILLARY
Glucose-Capillary: 140 mg/dL — ABNORMAL HIGH (ref 70–99)
Glucose-Capillary: 264 mg/dL — ABNORMAL HIGH (ref 70–99)
Glucose-Capillary: 67 mg/dL — ABNORMAL LOW (ref 70–99)
Glucose-Capillary: 134 mg/dL — ABNORMAL HIGH (ref 70–99)
Glucose-Capillary: 153 mg/dL — ABNORMAL HIGH (ref 70–99)

## 2021-09-29 LAB — BASIC METABOLIC PANEL
Anion gap: 11 (ref 5–15)
BUN: 25 mg/dL — ABNORMAL HIGH (ref 8–23)
CO2: 31 mmol/L (ref 22–32)
Calcium: 9.4 mg/dL (ref 8.9–10.3)
Chloride: 95 mmol/L — ABNORMAL LOW (ref 98–111)
Creatinine, Ser: 1.43 mg/dL — ABNORMAL HIGH (ref 0.61–1.24)
GFR, Estimated: 47 mL/min — ABNORMAL LOW (ref >60)
Glucose, Bld: 145 mg/dL — ABNORMAL HIGH (ref 70–99)
Potassium: 3.5 mmol/L (ref 3.5–5.1)
Sodium: 137 mmol/L (ref 135–145)

## 2021-09-29 LAB — MAGNESIUM: Magnesium: 2.4 mg/dL (ref 1.7–2.4)

## 2021-09-29 MED ORDER — METOPROLOL TARTRATE 25 MG PO TABS
12.5000 mg | ORAL_TABLET | Freq: Two times a day (BID) | ORAL | Status: DC
  Administered 2021-09-29 – 2021-10-01 (×4): 12.5 mg via ORAL
  Filled 2021-09-29 (×4): qty 1

## 2021-09-29 MED ORDER — POTASSIUM CHLORIDE CRYS ER 20 MEQ PO TBCR
40.0000 meq | EXTENDED_RELEASE_TABLET | Freq: Once | ORAL | Status: AC
  Administered 2021-09-29: 40 meq via ORAL
  Filled 2021-09-29: qty 2

## 2021-09-29 MED ORDER — IPRATROPIUM-ALBUTEROL 0.5-2.5 (3) MG/3ML IN SOLN
3.0000 mL | Freq: Two times a day (BID) | RESPIRATORY_TRACT | Status: DC
  Administered 2021-09-29 – 2021-10-01 (×4): 3 mL via RESPIRATORY_TRACT
  Filled 2021-09-29 (×4): qty 3

## 2021-09-29 NOTE — Progress Notes (Signed)
Hypoglycemic Event  CBG: 67  Treatment: 8 oz juice/soda  Symptoms: None  Follow-up CBG: [id number] CBG Result:134  Possible Reasons for Event:  Unknown Comments/MD notified:hypoglycemic protocol    Illa Level

## 2021-09-29 NOTE — TOC Initial Note (Addendum)
Transition of Care [facility]) - Initial/Assessment Note    Patient Details  Name: Todd Armstrong MRN: [id number] Date of Birth: 1931-01-29  Transition of Care [facility]) CM/SW Contact:    Leeroy Cha, RN Phone Number: 09/29/2021, 9:22 AM  Clinical Narrative:                 87 year old male admitted to the hospital with progressive dyspnea on exertion and orthopnea.  Found to have decompensated CHF.  He was admitted for IV diuresis.  TOC PLAN OF CARE: Request for home heart failure [facility]  Expected Discharge Plan: [place] Barriers to Discharge: No Barriers Identified   Patient Goals and CMS Choice Patient states their goals for this hospitalization and ongoing recovery are:: to go home CMS Medicare.gov Compare Post Acute Care list provided to:: Patient Choice offered to / list presented to : Patient  Expected Discharge Plan and Services Expected Discharge Plan: [place]   Discharge Planning Services: CM Consult Post Acute Care Choice: [place] arrangements for the past 2 months: Single Family Home                           HH Arranged: Disease Management [place] Agency: Charles [place] Date [place]: 09/29/21 Time [place]: [phone] Representative spoke with at [place]: stacie  Prior Living Arrangements/Services Living arrangements for the past 2 months: [place] with:: Self Patient language and need for interpreter reviewed:: Yes Do you feel safe going back to the place where you live?: Yes            Criminal Activity/Legal Involvement Pertinent to Current Situation/Hospitalization: No - Comment as needed  Activities of Daily Living Home Assistive Devices/Equipment: Cane (specify quad or straight), CBG Meter, Blood pressure cuff ADL Screening (condition at time of admission) Patient's cognitive ability adequate to safely complete daily activities?:  Yes Is the patient deaf or have difficulty hearing?: No Does the patient have difficulty seeing, even when wearing glasses/contacts?: No Does the patient have difficulty concentrating, remembering, or making decisions?: No Patient able to express need for assistance with ADLs?: Yes Does the patient have difficulty dressing or bathing?: No Independently performs ADLs?: Yes (appropriate for developmental age) Does the patient have difficulty walking or climbing stairs?: No Weakness of Legs: None Weakness of Arms/Hands: None  Permission Sought/Granted                  Emotional Assessment Appearance:: Appears stated age     Orientation: : Oriented to Self, Oriented to Place, Oriented to  Time, Oriented to Situation Alcohol / Substance Use: Not Applicable Psych Involvement: No (comment)  Admission diagnosis:  SOB (shortness of breath) [R06.02] CHF exacerbation (HCC) [I50.9] Patient Active Problem List   Diagnosis Date Noted   CHF exacerbation ([place]) 09/27/2021   Emphysema/COPD ([place]) 09/27/2021   Type 2 diabetes mellitus with hyperlipidemia ([place]) 09/27/2021   Pulmonary HTN ([place]) 06/03/2021   Persistent atrial fibrillation ([place])    Carotid stenosis, asymptomatic, right 03/23/2018   Bilateral carotid bruits 10/15/2016   Acute myocardial infarction    Dyslipidemia    Coronary artery disease    Hypertension    Hyperlipemia    PCP:  Lavone Orn, MD Pharmacy:   [facility], [place] - [address] [place] [address] Phone: [phone]  Fax: [phone]  [facility]  ([place]) - [address], [facility] [address] [place] [address] [place] [address] Phone: [phone] Fax: [phone]     Social Determinants of Health (SDOH) Interventions    Readmission Risk Interventions No flowsheet data found.

## 2021-09-29 NOTE — Consult Note (Addendum)
Cardiology Consultation:   Patient ID: Todd Armstrong MRN: [id number]; DOB: 1931-10-28  Admit date: 09/27/2021 Date of Consult: 09/29/2021  PCP:  Lavone Orn, MD   [facility] Providers Cardiologist:  [name], MD   Patient Profile:   Todd Armstrong is a 87 y.o. male with a hx of CAD with remote LAD stent in 2009, HTN, DM, HLD, and carotid artery stenosis s/p right CEA, pulmonary hypertension, and PAF on eliquis who is being seen 09/29/2021 for the evaluation of CHF at the request of Dr. Roderic Palau.  History of Present Illness:   Todd Armstrong has followed with Dr. Acie Fredrickson for the above cardiac problems and was last seen in clinic 06/03/21. He had moderate to severe right carotid artery stenosis and underwent right carotid endarterectomy with Dr. Donzetta Matters in 2019.  He developed atrial fibrillation an underwent DCCV 06/05/20. Echo in June 2021 showed normal LVEF, moderate MR, mild AI, and mild tricuspid insufficiency. He reverted back to Afib and is anticoagulated with eliquis.  He had COVID in April 2022. He follows with pulmonology for mild pulmonary hypertension.   He presented to [facility]21 with dyspnea, orthopnea x 8 months, and DOE for weeks to months. He was walking out of church and became very SOB, fellow churchgoers urged him to be evaluated in the ER.   On arrival,  BNP 465 Hb 10.2 sCr 1.09 --> 1.43 HS troponin 13 --> 16 --> 28 A1c 6.3% Mg 1.9 --> 2.4 K 3.3 --> 4.0 TSH 1.377  Echo ordered and demonstrated LVEF of 45-50%, normal RV function, moderately elevated PASP, moderately dilated left atrium, and mild MR.   Lower extremity venous duplex negative for DVT.   Cardiology was consulted for mildly reduced LVEF and management of Afib. He reports first developing shortness of breath in Feb. He tested negative for COVID several times and was referred to pulmonology. He was evaluated by pulmonology but never had resolution of his dyspnea. He eventually developed COVID  in April but did not require hospitalization.  He reports ongoing lower extremity edema associated with DM. He developed significant orthopnea and PND in Feb. He reported lower abdominal discomfort when getting up to void in the middle of the night. He denies chest pain and other changes in Feb.    Past Medical History:  Diagnosis Date   Acute myocardial infarction    Carotid stenosis    Coronary artery disease    PTCA and stenting of his right coronary artery. 3.5 x 16-mm Liberte stent.               Diabetes mellitus without complication (HCC)    Dyslipidemia    Hyperlipemia    Hypertension    Partial tear of rotator cuff    right shoulder   Peripheral vascular disease ([place])    Pulmonary hypertension (HCC)    moderate pulmonary HTN by 2007 echo (RVSP 52 mmHg)   Stroke [facility])    Wears dentures     Past Surgical History:  Procedure Laterality Date   CARDIAC CATHETERIZATION  11/05/2008   CARDIAC CATHETERIZATION  10/22/1993   EF 60%   CARDIOVERSION N/A 06/05/2020   Procedure: CARDIOVERSION;  Surgeon: Thayer Headings, MD;  Location: [place];  Service: Cardiovascular;  Laterality: N/A;   CAROTID ENDARTERECTOMY Right 03/23/2018   CORONARY ANGIOPLASTY WITH STENT PLACEMENT     ENDARTERECTOMY Right 03/23/2018   Procedure: RIGHT CAROTID ARTERY ENDARTERECTOMY;  Surgeon: Waynetta Sandy, MD;  Location: [place];  Service: Vascular;  Laterality: Right;   FRACTURE SURGERY     righ fibula and tibia   HERNIA REPAIR  1984   KNEE ARTHROSCOPY Left    MULTIPLE TOOTH EXTRACTIONS     PATCH ANGIOPLASTY Right 03/23/2018   Procedure: WITH XENOSURE 1CM X 6CM PATCH ANGIOPLASTY;  Surgeon: Waynetta Sandy, MD;  Location: [facility] OR;  Service: Vascular;  Laterality: Right;   SHOULDER ARTHROSCOPY Right 06/09/2017   Procedure: ARTHROSCOPY SHOULDER SUBACROMIAL DECOMPRESSION AND ACROMIOPLASTY;  Surgeon: Melrose Nakayama, MD;  Location: [place];  Service: Orthopedics;  Laterality: Right;  GENERAL  ANESTHESIA WITH BLOCK     Home Medications:  Prior to Admission medications   Medication Sig Start Date End Date Taking? Authorizing Provider  albuterol (VENTOLIN HFA) 108 (90 Base) MCG/ACT inhaler Inhale 2 puffs into the lungs every 6 (six) hours as needed for wheezing or shortness of breath. 03/25/21  Yes Brand Males, MD  apixaban (ELIQUIS) 5 MG TABS tablet Take 1 tablet (5 mg total) by mouth 2 (two) times daily. 06/03/21  Yes Kaleab Frasier, Wonda Cheng, MD  carvedilol (COREG) 12.5 MG tablet TAKE 1 TABLET BY MOUTH  TWICE DAILY 04/13/21  Yes Keyira Mondesir, Wonda Cheng, MD  clopidogrel (PLAVIX) 75 MG tablet TAKE 1 TABLET BY MOUTH  DAILY Patient taking differently: Take 75 mg by mouth in the morning. 01/27/21  Yes Mayank Teuscher, Wonda Cheng, MD  diltiazem (CARDIZEM) 120 MG tablet Take 120 mg by mouth daily.   Yes [provider]  furosemide (LASIX) 40 MG tablet Take 40 mg by mouth daily.   Yes [provider]  metFORMIN (GLUCOPHAGE) 1000 MG tablet Take 1,000 mg by mouth 2 (two) times daily with a meal.   Yes [provider]  nitroGLYCERIN (NITROSTAT) 0.4 MG SL tablet Place 1 tablet (0.4 mg total) under the tongue every 5 (five) minutes as needed for chest pain. 10/17/19  Yes Rudy Luhmann, Wonda Cheng, MD  potassium chloride (KLOR-CON) 10 MEQ tablet Take 10 mEq by mouth daily. 01/29/21  Yes [provider]  rosuvastatin (CRESTOR) 10 MG tablet TAKE 1 TABLET BY MOUTH  DAILY Patient taking differently: Take 10 mg by mouth daily. 10/13/20  Yes Paighton Godette, Wonda Cheng, MD  tamsulosin (FLOMAX) 0.4 MG CAPS capsule Take 0.4 mg by mouth at bedtime.  04/07/20  Yes [provider]  Tiotropium Bromide Monohydrate (SPIRIVA RESPIMAT) 2.5 MCG/ACT AERS Inhale 2 puffs into the lungs daily. 04/21/21  Yes Spero Geralds, MD  triamcinolone cream (KENALOG) 0.1 % Apply 1 application topically as needed (for itching). 07/22/20  Yes [provider]    Inpatient Medications: Scheduled Meds:  apixaban  5 mg Oral  BID   budesonide (PULMICORT) nebulizer solution  0.25 mg Nebulization BID   carvedilol  3.125 mg Oral BID WC   clopidogrel  75 mg Oral Daily   insulin aspart  0-15 Units Subcutaneous TID WC   insulin aspart  0-5 Units Subcutaneous QHS   ipratropium-albuterol  3 mL Nebulization BID   rosuvastatin  10 mg Oral Daily   tamsulosin  0.4 mg Oral QHS   Continuous Infusions:  sodium chloride     PRN Meds: acetaminophen **OR** acetaminophen, albuterol, ondansetron **OR** ondansetron (ZOFRAN) IV  Allergies:   No Known Allergies  Social History:   Social History   Socioeconomic History   Marital status: Widowed    Spouse name: Not on file   Number of children: Not on file   Years of education: Not on file   Highest education level: Not on  file  Occupational History   Not on file  Tobacco Use   Smoking status: Former    Packs/day: 1.00    Years: 5.00    Pack years: 5.00    Types: Cigarettes   Smokeless tobacco: Never   Tobacco comments:    quit smoking cigarettes > 40 years ago  Vaping Use   Vaping Use: Never used  Substance and Sexual Activity   Alcohol use: No   Drug use: No   Sexual activity: Not on file  Other Topics Concern   Not on file  Social History Narrative   Not on file   Social Determinants of Health   Financial Resource Strain: Not on file  Food Insecurity: Not on file  Transportation Needs: Not on file  Physical Activity: Not on file  Stress: Not on file  Social Connections: Not on file  Intimate Partner Violence: Not on file    Family History:    Family History  Problem Relation Age of Onset   Heart attack Father      ROS:  Please see the history of present illness.   All other ROS reviewed and negative.     Physical Exam/Data:   Vitals:   09/28/21 2056 09/29/21 0448 09/29/21 0810 09/29/21 0909  BP: (!) 106/52 102/74  126/77  Pulse: 60 60  100  Resp: 16 18    Temp: 98.6 F (37 C) 97.9 F (36.6 C)    TempSrc: Oral Oral    SpO2: 96%  93% 95%   Weight:  62.3 kg    Height:        Intake/Output Summary (Last 24 hours) at 09/29/2021 1053 Last data filed at 09/29/2021 0936 Gross per 24 hour  Intake 1020 ml  Output 2470 ml  Net -1450 ml   Last 3 Weights 09/29/2021 09/28/2021 09/27/2021  Weight (lbs) 137 lb 5.6 oz 138 lb 3.2 oz 146 lb 1.6 oz  Weight (kg) 62.3 kg 62.687 kg 66.271 kg     Body mass index is 20.88 kg/m.  General:  elderly male in NAD HEENT: normal Neck: no JVD Vascular: No carotid bruits; Distal pulses 2+ bilaterally Cardiac:  irregular rhythm, tachycardic rate, no murmur Lungs:  clear to auscultation bilaterally, no wheezing, rhonchi or rales  Abd: soft, nontender, no hepatomegaly  Ext: mild B LE edema with chronic skin changes Musculoskeletal:  No deformities, BUE and BLE strength normal and equal Skin: warm and dry  Neuro:  CNs 2-12 intact, no focal abnormalities noted Psych:  Normal affect   EKG:  The EKG was personally reviewed and demonstrates:  atrial fibrillation with ventricular rate 51 Telemetry:  Telemetry was personally reviewed and demonstrates:  Afib HR 80-110s  Relevant CV Studies:  Echo 09/28/21:  1. Left ventricular ejection fraction, by estimation, is 45 to 50%. The  left ventricle has mildly decreased function. The left ventricle  demonstrates global hypokinesis. Left ventricular diastolic parameters are  indeterminate.   2. Right ventricular systolic function is normal. The right ventricular  size is normal. There is moderately elevated pulmonary artery systolic  pressure. The estimated right ventricular systolic pressure is 40.9 mmHg.   3. Left atrial size was moderately dilated.   4. The mitral valve is normal in structure. Mild mitral valve  regurgitation. No evidence of mitral stenosis.   5. The aortic valve is calcified. There is moderate calcification of the  aortic valve. Aortic valve regurgitation is mild. Mild to moderate aortic  valve sclerosis/calcification is  present,  without any evidence of aortic  stenosis.   6. The inferior vena cava is normal in size with greater than 50%  respiratory variability, suggesting right atrial pressure of 3 mmHg.    Laboratory Data:  High Sensitivity Troponin:   Recent Labs  Lab 09/27/21 1411 09/27/21 1617 09/28/21 1453  TROPONINIHS 13 16 28*     Chemistry Recent Labs  Lab 09/27/21 1411 09/28/21 0400 09/28/21 0429 09/29/21 0729  NA 133* 137  --  137  K 4.4 3.3* 4.0 3.5  CL 104 97*  --  95*  CO2 21* 28  --  31  GLUCOSE 125* 114*  --  145*  BUN 18 22  --  25*  CREATININE 1.09 1.27*  --  1.43*  CALCIUM 8.7* 9.0  --  9.4  MG  --  1.9  --  2.4  GFRNONAA >60 54*  --  47*  ANIONGAP 8 12  --  11    No results for input(s): PROT, ALBUMIN, AST, ALT, ALKPHOS, BILITOT in the last 168 hours. Lipids No results for input(s): CHOL, TRIG, HDL, LABVLDL, LDLCALC, CHOLHDL in the last 168 hours.  Hematology Recent Labs  Lab 09/27/21 1411 09/28/21 0400  WBC 6.5 6.3  RBC 3.39* 3.48*  HGB 10.2* 10.4*  HCT 32.8* 33.0*  MCV 96.8 94.8  MCH 30.1 29.9  MCHC 31.1 31.5  RDW 16.1* 16.1*  PLT 149* 151   Thyroid  Recent Labs  Lab 09/28/21 1453  TSH 1.377    BNP Recent Labs  Lab 09/27/21 1417  BNP 464.8*    DDimer No results for input(s): DDIMER in the last 168 hours.   Radiology/Studies:  DG Chest 2 View  Result Date: 09/27/2021 CLINICAL DATA:  Shortness of breath EXAM: CHEST - 2 VIEW COMPARISON:  01/26/2021 FINDINGS: Small right pleural effusion. Right base atelectasis. Mild hyperinflation of the lungs. Heart is normal size. No acute bony abnormality. IMPRESSION: Small right pleural effusion with right base atelectasis. Electronically Signed   By: Rolm Baptise M.D.   On: 09/27/2021 15:03   ECHOCARDIOGRAM COMPLETE  Result Date: 09/28/2021    ECHOCARDIOGRAM REPORT   Patient Name:   Todd Armstrong Date of Exam: 09/28/2021 Medical Rec #:  [id number]         Height:       68.0 in Accession #:     [id number]        Weight:       138.2 lb Date of Birth:  Dec 09, 1931         BSA:          1.747 m Patient Age:    33 years          BP:           110/75 mmHg Patient Gender: M                 HR:           100 bpm. Exam Location:  Inpatient Procedure: 2D Echo, Cardiac Doppler and Color Doppler Indications:    Congestive Heart Failure I50.9  History:        Patient has prior history of Echocardiogram examinations, most                 recent 05/08/2021. CAD and Previous Myocardial Infarction,                 Pulmonary HTN and Stroke; Risk Factors:Hypertension,  Dyslipidemia and Diabetes.  Sonographer:    Bernadene Person RDCS Referring Phys: [place]  1. Left ventricular ejection fraction, by estimation, is 45 to 50%. The left ventricle has mildly decreased function. The left ventricle demonstrates global hypokinesis. Left ventricular diastolic parameters are indeterminate.  2. Right ventricular systolic function is normal. The right ventricular size is normal. There is moderately elevated pulmonary artery systolic pressure. The estimated right ventricular systolic pressure is 16.1 mmHg.  3. Left atrial size was moderately dilated.  4. The mitral valve is normal in structure. Mild mitral valve regurgitation. No evidence of mitral stenosis.  5. The aortic valve is calcified. There is moderate calcification of the aortic valve. Aortic valve regurgitation is mild. Mild to moderate aortic valve sclerosis/calcification is present, without any evidence of aortic stenosis.  6. The inferior vena cava is normal in size with greater than 50% respiratory variability, suggesting right atrial pressure of 3 mmHg. FINDINGS  Left Ventricle: Left ventricular ejection fraction, by estimation, is 45 to 50%. The left ventricle has mildly decreased function. The left ventricle demonstrates global hypokinesis. The left ventricular internal cavity size was normal in size. There is  no left ventricular  hypertrophy. Left ventricular diastolic parameters are indeterminate. Right Ventricle: The right ventricular size is normal. No increase in right ventricular wall thickness. Right ventricular systolic function is normal. There is moderately elevated pulmonary artery systolic pressure. The tricuspid regurgitant velocity is 3.57 m/s, and with an assumed right atrial pressure of 3 mmHg, the estimated right ventricular systolic pressure is 09.6 mmHg. Left Atrium: Left atrial size was moderately dilated. Right Atrium: Right atrial size was normal in size. Pericardium: There is no evidence of pericardial effusion. Mitral Valve: The mitral valve is normal in structure. Mild mitral valve regurgitation. No evidence of mitral valve stenosis. Tricuspid Valve: The tricuspid valve is normal in structure. Tricuspid valve regurgitation is mild . No evidence of tricuspid stenosis. Aortic Valve: The aortic valve is calcified. There is moderate calcification of the aortic valve. Aortic valve regurgitation is mild. Aortic regurgitation PHT measures 447 msec. Mild to moderate aortic valve sclerosis/calcification is present, without any evidence of aortic stenosis. Pulmonic Valve: The pulmonic valve was normal in structure. Pulmonic valve regurgitation is trivial. No evidence of pulmonic stenosis. Aorta: The aortic root is normal in size and structure. Venous: The inferior vena cava is normal in size with greater than 50% respiratory variability, suggesting right atrial pressure of 3 mmHg. IAS/Shunts: No atrial level shunt detected by color flow Doppler.  LEFT VENTRICLE PLAX 2D LVIDd:         4.10 cm LVIDs:         3.10 cm LV PW:         1.00 cm LV IVS:        1.10 cm LVOT diam:     2.10 cm LV SV:         56 LV SV Index:   32 LVOT Area:     3.46 cm  LV Volumes (MOD) LV vol d, MOD A2C: 109.0 ml LV vol d, MOD A4C: 128.0 ml LV vol s, MOD A2C: 49.8 ml LV vol s, MOD A4C: 69.5 ml LV SV MOD A2C:     59.2 ml LV SV MOD A4C:     128.0 ml LV SV  MOD BP:      60.8 ml RIGHT VENTRICLE TAPSE (M-mode): 1.4 cm LEFT ATRIUM  Index        RIGHT ATRIUM           Index LA diam:        3.00 cm 1.72 cm/m   RA Area:     14.30 cm LA Vol (A2C):   76.2 ml 43.63 ml/m  RA Volume:   31.90 ml  18.26 ml/m LA Vol (A4C):   65.9 ml 37.73 ml/m LA Biplane Vol: 74.0 ml 42.37 ml/m  AORTIC VALVE LVOT Vmax:   92.93 cm/s LVOT Vmean:  60.633 cm/s LVOT VTI:    0.160 m AI PHT:      447 msec  AORTA Ao Asc diam: 3.60 cm MR Peak grad:    125.4 mmHg   TRICUSPID VALVE MR Mean grad:    88.0 mmHg    TR Peak grad:   51.0 mmHg MR Vmax:         560.00 cm/s  TR Vmax:        357.00 cm/s MR Vmean:        448.0 cm/s MR PISA:         0.57 cm     SHUNTS MR PISA Eff ROA: 4 mm        Systemic VTI:  0.16 m MR PISA Radius:  0.30 cm      Systemic Diam: 2.10 cm Todd Furbish MD Electronically signed by Todd Furbish MD Signature Date/Time: 09/28/2021/11:23:54 AM    Final    VAS [place] LOWER EXTREMITY VENOUS (DVT)  Result Date: 09/28/2021  Lower Venous DVT Study Patient Name:  Todd Armstrong  Date of Exam:   09/28/2021 Medical Rec #: [id number]          Accession #:    [id number] Date of Birth: 1931/02/23          Patient Gender: M Patient Age:   7 years Exam Location:  [facility] Procedure:      VAS [place] LOWER EXTREMITY VENOUS (DVT) Referring Phys: Jolaine Artist MEMON --------------------------------------------------------------------------------  Comparison Study: No previous exams Performing Technologist: Jody Hill RVT, RDMS  Examination Guidelines: A complete evaluation includes B-mode imaging, spectral Doppler, color Doppler, and power Doppler as needed of all accessible portions of each vessel. Bilateral testing is considered an integral part of a complete examination. Limited examinations for reoccurring indications may be performed as noted. The reflux portion of the exam is performed with the patient in reverse Trendelenburg.   +-----+---------------+---------+-----------+----------+--------------+ RIGHTCompressibilityPhasicitySpontaneityPropertiesThrombus Aging +-----+---------------+---------+-----------+----------+--------------+ CFV  Full           Yes      Yes                                 +-----+---------------+---------+-----------+----------+--------------+   +---------+---------------+---------+-----------+----------+--------------+ LEFT     CompressibilityPhasicitySpontaneityPropertiesThrombus Aging +---------+---------------+---------+-----------+----------+--------------+ CFV      Full           Yes      Yes                                 +---------+---------------+---------+-----------+----------+--------------+ SFJ      Full                                                        +---------+---------------+---------+-----------+----------+--------------+  FV Prox  Full           Yes      Yes                                 +---------+---------------+---------+-----------+----------+--------------+ FV Mid   Full           Yes      Yes                                 +---------+---------------+---------+-----------+----------+--------------+ FV DistalFull           Yes      Yes                                 +---------+---------------+---------+-----------+----------+--------------+ PFV      Full                                                        +---------+---------------+---------+-----------+----------+--------------+ POP      Full           Yes      Yes                                 +---------+---------------+---------+-----------+----------+--------------+ PTV      Full                                                        +---------+---------------+---------+-----------+----------+--------------+ PERO     Full                                                         +---------+---------------+---------+-----------+----------+--------------+     Summary: RIGHT: - No evidence of common femoral vein obstruction.  LEFT: - There is no evidence of deep vein thrombosis in the lower extremity. - There is no evidence of superficial venous thrombosis.  - No cystic structure found in the popliteal fossa.  *See table(s) above for measurements and observations. Electronically signed by Servando Snare MD on 09/28/2021 at 4:35:00 PM.    Final      Assessment and Plan:   Acute on chronic systolic and diastolic heart failure Moderate pulmonary hypertension - has received 40 mg IV lasix x 5 doses  - overall net negative 5.7 L with 3 L urine output yesterday - he finally feels like he is breathing well - his renal function has bumped, would recommend holding additional lasix at this time  - he appears euvolemic on exam - given borderline pressure, will change low dose coreg to 12.5 mg metoprolol - consider adding low dose spironolactone to his discharge medications - will need close follow up with labs in 1 week   CAD s/p LAD stent 2009 Hyperlipidemia with LDL goal < 70 -  on plavix, BB, crestor - denies chest pain - defer ischemic evaluation for now   Persistent atrial fibrillation Chronic anticoagulation Bradycardia  - maintained on 12.5 mg coreg and 120 mg cardizem at home - [facility] with eliquis 5 mg BID - will need to watch renal function to determine eligibility of lower dose eliquis, will keep 5 mg BID for now - had bradycardia yesterday afternoon - Cardizem was stopped and coreg reduced to 3.125 mg BID - HR now tachycardic in the 100-110s - will need to watch for tachy-brady syndrome - given marginal pressure, switch coreg to low dose metoprolol   Hypertension - BB, cardizem - given mildly reduced EF, will not restart cardizem - BP marginal - watch for improvement off of lasix - consider low dose spironolactone as renal function and BP allow   AKI - sCr  1.43 - this may be near his baseline, but was 1.09 at admission - holding lasix for now - continue to follow   DM2 - SSI - A1c 6.3% - hold off on SGLT2i for now, GFR 47   Carotid artery stenosis  - s/p right CEA - no syncope - continue plavix and statin - followed with [facility]VS    Risk Assessment/Risk Scores:      New York Heart Association (NYHA) Functional Class NYHA Class II  CHA2DS2-VASc Score = 6   This indicates a 9.7% annual risk of stroke. The patient's score is based upon: CHF History: 1 HTN History: 1 Diabetes History: 1 Stroke History: 0 Vascular Disease History: 1 Age Score: 2 Gender Score: 0      For questions or updates, please contact [facility] Please consult [email or web address] for contact info under    Signed, Ledora Bottcher, [place]  09/29/2021 10:53 AM  Attending Note:   The patient was seen and examined.  Agree with assessment and plan as noted above.  Changes made to the above note as needed.  Patient seen and independently examined with Doreene Adas, PA .   We discussed all aspects of the encounter. I agree with the assessment and plan as stated above.   1.  Acute on chronic combined systolic and diastolic congestive heart failure: Mr. Hedeen is admitted with acute heart failure symptoms.  I suspect that his dietary indiscretion is primarily the cause.  He tries to avoid salt but when questioned he admits that he eats a lot of preprepared meals that he buys, takes out of the freezer and then sticks in the oven or microwave.  I have advised him to stick to grilled chicken or rotisserie chicken without salt.  He is done well with IV Lasix.  We will hold his Lasix today and allow his creatinine to catch up a little bit.  I suspect he is a little over diuresed today.  Will consider adding spironolactone in addition to home dose of Lasix.  Echocardiogram reveals an ejection fraction of around 45%.  We will discontinue the Cardizem.  2.  Coronary  artery disease: He is not having any episodes of angina  3.  Atrial fibrillation: He has persistent atrial fibrillation.  His heart rate has been a little bit slow.  We will discontinue the Cardizem.  I think he would do well with metoprolol for rate control rather than carvedilol since his blood pressure is a little on the low side.  4.  Hypertension: His blood pressure is little on the low side today.  5.  Chronic kidney disease: We will continue to follow.  6.  Carotid artery disease: He status post right carotid endarterectomy.  Follow-up with vein and vascular specialist.   I have spent a total of 40 minutes with patient reviewing hospital  notes , telemetry, EKGs, labs and examining patient as well as establishing an assessment and plan that was discussed with the patient.  > 50% of time was spent in direct patient care.    Thayer Headings, Brooke Bonito., MD, [facility] 09/29/2021, 11:09 AM [address],  [place] Pager [phone]

## 2021-09-29 NOTE — Progress Notes (Signed)
PROGRESS NOTE    Todd Armstrong  JKK:[id number] DOB: 09-27-1931 DOA: 09/27/2021 PCP: Lavone Orn, MD    Brief Narrative:  87 year old male admitted to the hospital with progressive dyspnea on exertion and orthopnea.  Found to have decompensated CHF.  He was admitted for IV diuresis.  Currently having tachycardia/bradycardia episodes.  Cardiology following and beta-blockers are being adjusted.   Assessment & Plan:   Active Problems:   Hypertension   Hyperlipemia   Persistent atrial fibrillation ([place])   Pulmonary HTN (HCC)   CHF exacerbation (HCC)   Emphysema/COPD (HCC)   Type 2 diabetes mellitus with hyperlipidemia (HCC)   Acute on chronic combined CHF Pulmonary hypertension -Echocardiogram has been updated and indicates a mild decline in EF since 04/2021.  Current EF noted to be 45 to 50% -He has had good urine output with IV Lasix -Since creatinine has trended up, holding further diuretics for today -Monitor intake and output/daily weights -Continue on beta-blockers -He will likely need to stay on daily/scheduled Lasix as an outpatient -Venous Dopplers of left lower extremity negative for DVT -Appreciate cardiology assistance   Emphysema -Chronically on Spiriva -We will continue on inhaled steroids and bronchodilators -Does not have any wheezing at this time   Persistent atrial fibrillation -He initially was continued on his home dose of diltiazem and Coreg -Patient had relatively sudden development of bradycardia on 10/17 around noon.  Heart rate in the high 40s to low 50s.  EKG shows slow A. fib versus junctional rhythm.  Patient appears to be asymptomatic, no chest pain or shortness of breath -Diltiazem has been discontinued -Seen by cardiology and Coreg changed to metoprolol   Type 2 diabetes -Holding oral agents -A1c 6.3 -Continue on sliding scale insulin   Hyperlipidemia -Continue statin   DVT prophylaxis:  apixaban (ELIQUIS) tablet 5 mg  Code  Status: DNR Family Communication: Discussed with patient Disposition Plan: Status is: Inpatient  Remains inpatient appropriate because: Continue diuresis and further input from cardiology.  We will need to monitor heart rate for stability.    Consultants:  Cardiology  Procedures:  Echocardiogram  Antimicrobials:      Subjective: Overall he is feeling better.  Denies any shortness of breath.  Heart rate which was bradycardic yesterday, appears to be tachycardic at times today.  No chest pain.  Objective: Vitals:   09/28/21 2056 09/29/21 0448 09/29/21 0810 09/29/21 0909  BP: (!) 106/52 102/74  126/77  Pulse: 60 60  100  Resp: 16 18    Temp: 98.6 F (37 C) 97.9 F (36.6 C)    TempSrc: Oral Oral    SpO2: 96% 93% 95%   Weight:  62.3 kg    Height:        Intake/Output Summary (Last 24 hours) at 09/29/2021 1134 Last data filed at 09/29/2021 0936 Gross per 24 hour  Intake 1020 ml  Output 2295 ml  Net -1275 ml   Filed Weights   09/27/21 1808 09/28/21 0434 09/29/21 0448  Weight: 66.3 kg 62.7 kg 62.3 kg    Examination:  General exam: Appears calm and comfortable  Respiratory system: Crackles at bases. Respiratory effort normal. Cardiovascular system: S1 & S2 heard, irregular. No JVD, murmurs, rubs, gallops or clicks.  1+ pedal edema, left greater than right Gastrointestinal system: Abdomen is nondistended, soft and nontender. No organomegaly or masses felt. Normal bowel sounds heard. Central nervous system: Alert and oriented. No focal neurological deficits. Extremities: Symmetric 5 x 5 power. Skin: No rashes, lesions or ulcers  Psychiatry: Judgement and insight appear normal. Mood & affect appropriate.     Data Reviewed: I have personally reviewed following labs and imaging studies  CBC: Recent Labs  Lab 09/27/21 1411 09/28/21 0400  WBC 6.5 6.3  NEUTROABS 4.4  --   HGB 10.2* 10.4*  HCT 32.8* 33.0*  MCV 96.8 94.8  PLT 149* 846   Basic Metabolic  Panel: Recent Labs  Lab 09/27/21 1411 09/28/21 0400 09/28/21 0429 09/29/21 0729  NA 133* 137  --  137  K 4.4 3.3* 4.0 3.5  CL 104 97*  --  95*  CO2 21* 28  --  31  GLUCOSE 125* 114*  --  145*  BUN 18 22  --  25*  CREATININE 1.09 1.27*  --  1.43*  CALCIUM 8.7* 9.0  --  9.4  MG  --  1.9  --  2.4  PHOS  --  4.7*  --   --    GFR: Estimated Creatinine Clearance: 31.5 mL/min (A) (by C-G formula based on SCr of 1.43 mg/dL (H)). Liver Function Tests: No results for input(s): AST, ALT, ALKPHOS, BILITOT, PROT, ALBUMIN in the last 168 hours. No results for input(s): LIPASE, AMYLASE in the last 168 hours. No results for input(s): AMMONIA in the last 168 hours. Coagulation Profile: No results for input(s): INR, PROTIME in the last 168 hours. Cardiac Enzymes: No results for input(s): CKTOTAL, CKMB, CKMBINDEX, TROPONINI in the last 168 hours. BNP (last 3 results) No results for input(s): PROBNP in the last 8760 hours. HbA1C: Recent Labs    09/28/21 0400  HGBA1C 6.3*   CBG: Recent Labs  Lab 09/28/21 0729 09/28/21 1057 09/28/21 1608 09/28/21 2142 09/29/21 0737  GLUCAP 114* 268* 109* 207* 140*   Lipid Profile: No results for input(s): CHOL, HDL, LDLCALC, TRIG, CHOLHDL, LDLDIRECT in the last 72 hours. Thyroid Function Tests: Recent Labs    09/28/21 1453  TSH 1.377   Anemia Panel: No results for input(s): VITAMINB12, FOLATE, FERRITIN, TIBC, IRON, RETICCTPCT in the last 72 hours. Sepsis Labs: No results for input(s): PROCALCITON, LATICACIDVEN in the last 168 hours.  Recent Results (from the past 240 hour(s))  Resp Panel by RT-PCR (Flu A&B, Covid) Nasopharyngeal Swab     Status: None   Collection Time: 09/27/21  2:17 PM   Specimen: Nasopharyngeal Swab; Nasopharyngeal(NP) swabs in vial transport medium  Result Value Ref Range Status   SARS Coronavirus 2 by RT PCR NEGATIVE NEGATIVE Final    Comment: (NOTE) SARS-CoV-2 target nucleic acids are NOT DETECTED.  The SARS-CoV-2  RNA is generally detectable in upper respiratory specimens during the acute phase of infection. The lowest concentration of SARS-CoV-2 viral copies this assay can detect is 138 copies/mL. A negative result does not preclude SARS-Cov-2 infection and should not be used as the sole basis for treatment or other patient management decisions. A negative result may occur with  improper specimen collection/handling, submission of specimen other than nasopharyngeal swab, presence of viral mutation(s) within the areas targeted by this assay, and inadequate number of viral copies(<138 copies/mL). A negative result must be combined with clinical observations, patient history, and epidemiological information. The expected result is Negative.  Fact Sheet for Patients:  EntrepreneurPulse.com.au  Fact Sheet for Healthcare Providers:  IncredibleEmployment.be  This test is no t yet approved or cleared by the [place] FDA and  has been authorized for detection and/or diagnosis of SARS-CoV-2 by FDA under an Emergency Use Authorization (EUA). This EUA will remain  in effect (  meaning this test can be used) for the duration of the COVID-19 declaration under Section 564(b)(1) of the Act, 21 U.S.C.section 360bbb-3(b)(1), unless the authorization is terminated  or revoked sooner.       Influenza A by PCR NEGATIVE NEGATIVE Final   Influenza B by PCR NEGATIVE NEGATIVE Final    Comment: (NOTE) The Xpert Xpress SARS-CoV-2/FLU/RSV plus assay is intended as an aid in the diagnosis of influenza from Nasopharyngeal swab specimens and should not be used as a sole basis for treatment. Nasal washings and aspirates are unacceptable for Xpert Xpress SARS-CoV-2/FLU/RSV testing.  Fact Sheet for Patients: EntrepreneurPulse.com.au  Fact Sheet for Healthcare Providers: IncredibleEmployment.be  This test is not yet approved or cleared by the  [place] FDA and has been authorized for detection and/or diagnosis of SARS-CoV-2 by FDA under an Emergency Use Authorization (EUA). This EUA will remain in effect (meaning this test can be used) for the duration of the COVID-19 declaration under Section 564(b)(1) of the Act, 21 U.S.C. section 360bbb-3(b)(1), unless the authorization is terminated or revoked.  Performed at [facility], [place] [address] [place], [place] [address]          Radiology Studies: DG Chest 2 View  Result Date: 09/27/2021 CLINICAL DATA:  Shortness of breath EXAM: CHEST - 2 VIEW COMPARISON:  01/26/2021 FINDINGS: Small right pleural effusion. Right base atelectasis. Mild hyperinflation of the lungs. Heart is normal size. No acute bony abnormality. IMPRESSION: Small right pleural effusion with right base atelectasis. Electronically Signed   By: Rolm Baptise M.D.   On: 09/27/2021 15:03   ECHOCARDIOGRAM COMPLETE  Result Date: 09/28/2021    ECHOCARDIOGRAM REPORT   Patient Name:   RAESHAWN VO Date of Exam: 09/28/2021 Medical Rec #:  [id number]         Height:       68.0 in Accession #:    [id number]        Weight:       138.2 lb Date of Birth:  1931-04-21         BSA:          1.747 m Patient Age:    60 years          BP:           110/75 mmHg Patient Gender: M                 HR:           100 bpm. Exam Location:  Inpatient Procedure: 2D Echo, Cardiac Doppler and Color Doppler Indications:    Congestive Heart Failure I50.9  History:        Patient has prior history of Echocardiogram examinations, most                 recent 05/08/2021. CAD and Previous Myocardial Infarction,                 Pulmonary HTN and Stroke; Risk Factors:Hypertension,                 Dyslipidemia and Diabetes.  Sonographer:    Bernadene Person RDCS Referring Phys: [place]  1. Left ventricular ejection fraction, by estimation, is 45 to 50%. The left ventricle has mildly decreased function. The left  ventricle demonstrates global hypokinesis. Left ventricular diastolic parameters are indeterminate.  2. Right ventricular systolic function is normal. The right ventricular size is normal. There is moderately elevated pulmonary artery systolic pressure. The estimated  right ventricular systolic pressure is 54.2 mmHg.  3. Left atrial size was moderately dilated.  4. The mitral valve is normal in structure. Mild mitral valve regurgitation. No evidence of mitral stenosis.  5. The aortic valve is calcified. There is moderate calcification of the aortic valve. Aortic valve regurgitation is mild. Mild to moderate aortic valve sclerosis/calcification is present, without any evidence of aortic stenosis.  6. The inferior vena cava is normal in size with greater than 50% respiratory variability, suggesting right atrial pressure of 3 mmHg. FINDINGS  Left Ventricle: Left ventricular ejection fraction, by estimation, is 45 to 50%. The left ventricle has mildly decreased function. The left ventricle demonstrates global hypokinesis. The left ventricular internal cavity size was normal in size. There is  no left ventricular hypertrophy. Left ventricular diastolic parameters are indeterminate. Right Ventricle: The right ventricular size is normal. No increase in right ventricular wall thickness. Right ventricular systolic function is normal. There is moderately elevated pulmonary artery systolic pressure. The tricuspid regurgitant velocity is 3.57 m/s, and with an assumed right atrial pressure of 3 mmHg, the estimated right ventricular systolic pressure is 70.6 mmHg. Left Atrium: Left atrial size was moderately dilated. Right Atrium: Right atrial size was normal in size. Pericardium: There is no evidence of pericardial effusion. Mitral Valve: The mitral valve is normal in structure. Mild mitral valve regurgitation. No evidence of mitral valve stenosis. Tricuspid Valve: The tricuspid valve is normal in structure. Tricuspid valve  regurgitation is mild . No evidence of tricuspid stenosis. Aortic Valve: The aortic valve is calcified. There is moderate calcification of the aortic valve. Aortic valve regurgitation is mild. Aortic regurgitation PHT measures 447 msec. Mild to moderate aortic valve sclerosis/calcification is present, without any evidence of aortic stenosis. Pulmonic Valve: The pulmonic valve was normal in structure. Pulmonic valve regurgitation is trivial. No evidence of pulmonic stenosis. Aorta: The aortic root is normal in size and structure. Venous: The inferior vena cava is normal in size with greater than 50% respiratory variability, suggesting right atrial pressure of 3 mmHg. IAS/Shunts: No atrial level shunt detected by color flow Doppler.  LEFT VENTRICLE PLAX 2D LVIDd:         4.10 cm LVIDs:         3.10 cm LV PW:         1.00 cm LV IVS:        1.10 cm LVOT diam:     2.10 cm LV SV:         56 LV SV Index:   32 LVOT Area:     3.46 cm  LV Volumes (MOD) LV vol d, MOD A2C: 109.0 ml LV vol d, MOD A4C: 128.0 ml LV vol s, MOD A2C: 49.8 ml LV vol s, MOD A4C: 69.5 ml LV SV MOD A2C:     59.2 ml LV SV MOD A4C:     128.0 ml LV SV MOD BP:      60.8 ml RIGHT VENTRICLE TAPSE (M-mode): 1.4 cm LEFT ATRIUM             Index        RIGHT ATRIUM           Index LA diam:        3.00 cm 1.72 cm/m   RA Area:     14.30 cm LA Vol (A2C):   76.2 ml 43.63 ml/m  RA Volume:   31.90 ml  18.26 ml/m LA Vol (A4C):   65.9 ml 37.73 ml/m LA  Biplane Vol: 74.0 ml 42.37 ml/m  AORTIC VALVE LVOT Vmax:   92.93 cm/s LVOT Vmean:  60.633 cm/s LVOT VTI:    0.160 m AI PHT:      447 msec  AORTA Ao Asc diam: 3.60 cm MR Peak grad:    125.4 mmHg   TRICUSPID VALVE MR Mean grad:    88.0 mmHg    TR Peak grad:   51.0 mmHg MR Vmax:         560.00 cm/s  TR Vmax:        357.00 cm/s MR Vmean:        448.0 cm/s MR PISA:         0.57 cm     SHUNTS MR PISA Eff ROA: 4 mm        Systemic VTI:  0.16 m MR PISA Radius:  0.30 cm      Systemic Diam: 2.10 cm Candee Furbish MD  Electronically signed by Candee Furbish MD Signature Date/Time: 09/28/2021/11:23:54 AM    Final    VAS [place] LOWER EXTREMITY VENOUS (DVT)  Result Date: 09/28/2021  Lower Venous DVT Study Patient Name:  SIDDHARTHA HOBACK  Date of Exam:   09/28/2021 Medical Rec #: [id number]          Accession #:    [id number] Date of Birth: [birth year]          Patient Gender: M Patient Age:   38 years Exam Location:  [facility] Procedure:      VAS [place] LOWER EXTREMITY VENOUS (DVT) Referring Phys: Jolaine Artist Myleah Cavendish --------------------------------------------------------------------------------  Comparison Study: No previous exams Performing Technologist: Jody Hill RVT, RDMS  Examination Guidelines: A complete evaluation includes B-mode imaging, spectral Doppler, color Doppler, and power Doppler as needed of all accessible portions of each vessel. Bilateral testing is considered an integral part of a complete examination. Limited examinations for reoccurring indications may be performed as noted. The reflux portion of the exam is performed with the patient in reverse Trendelenburg.  +-----+---------------+---------+-----------+----------+--------------+ RIGHTCompressibilityPhasicitySpontaneityPropertiesThrombus Aging +-----+---------------+---------+-----------+----------+--------------+ CFV  Full           Yes      Yes                                 +-----+---------------+---------+-----------+----------+--------------+   +---------+---------------+---------+-----------+----------+--------------+ LEFT     CompressibilityPhasicitySpontaneityPropertiesThrombus Aging +---------+---------------+---------+-----------+----------+--------------+ CFV      Full           Yes      Yes                                 +---------+---------------+---------+-----------+----------+--------------+ SFJ      Full                                                         +---------+---------------+---------+-----------+----------+--------------+ FV Prox  Full           Yes      Yes                                 +---------+---------------+---------+-----------+----------+--------------+ FV Mid   Full  Yes      Yes                                 +---------+---------------+---------+-----------+----------+--------------+ FV DistalFull           Yes      Yes                                 +---------+---------------+---------+-----------+----------+--------------+ PFV      Full                                                        +---------+---------------+---------+-----------+----------+--------------+ POP      Full           Yes      Yes                                 +---------+---------------+---------+-----------+----------+--------------+ PTV      Full                                                        +---------+---------------+---------+-----------+----------+--------------+ PERO     Full                                                        +---------+---------------+---------+-----------+----------+--------------+     Summary: RIGHT: - No evidence of common femoral vein obstruction.  LEFT: - There is no evidence of deep vein thrombosis in the lower extremity. - There is no evidence of superficial venous thrombosis.  - No cystic structure found in the popliteal fossa.  *See table(s) above for measurements and observations. Electronically signed by Servando Snare MD on 09/28/2021 at 4:35:00 PM.    Final         Scheduled Meds:  apixaban  5 mg Oral BID   budesonide (PULMICORT) nebulizer solution  0.25 mg Nebulization BID   clopidogrel  75 mg Oral Daily   insulin aspart  0-15 Units Subcutaneous TID WC   insulin aspart  0-5 Units Subcutaneous QHS   ipratropium-albuterol  3 mL Nebulization BID   metoprolol tartrate  12.5 mg Oral BID   potassium chloride  40 mEq Oral Once   rosuvastatin  10 mg Oral  Daily   tamsulosin  0.4 mg Oral QHS   Continuous Infusions:  sodium chloride       LOS: 1 day    Time spent: 87mins    Kathie Dike, MD [facility]   If 7PM-7AM, please contact night-coverage [email or web address]  09/29/2021, 11:34 AM

## 2021-09-30 DIAGNOSIS — I2510 Atherosclerotic heart disease of native coronary artery without angina pectoris: Secondary | ICD-10-CM | POA: Diagnosis not present

## 2021-09-30 DIAGNOSIS — I5043 Acute on chronic combined systolic (congestive) and diastolic (congestive) heart failure: Secondary | ICD-10-CM | POA: Diagnosis not present

## 2021-09-30 DIAGNOSIS — I4819 Other persistent atrial fibrillation: Secondary | ICD-10-CM | POA: Diagnosis not present

## 2021-09-30 DIAGNOSIS — J431 Panlobular emphysema: Secondary | ICD-10-CM

## 2021-09-30 LAB — BASIC METABOLIC PANEL
Anion gap: 9 (ref 5–15)
BUN: 29 mg/dL — ABNORMAL HIGH (ref 8–23)
CO2: 30 mmol/L (ref 22–32)
Calcium: 9.2 mg/dL (ref 8.9–10.3)
Chloride: 97 mmol/L — ABNORMAL LOW (ref 98–111)
Creatinine, Ser: 1.47 mg/dL — ABNORMAL HIGH (ref 0.61–1.24)
GFR, Estimated: 46 mL/min — ABNORMAL LOW (ref >60)
Glucose, Bld: 133 mg/dL — ABNORMAL HIGH (ref 70–99)
Potassium: 4.3 mmol/L (ref 3.5–5.1)
Sodium: 136 mmol/L (ref 135–145)

## 2021-09-30 LAB — GLUCOSE, CAPILLARY
Glucose-Capillary: 124 mg/dL — ABNORMAL HIGH (ref 70–99)
Glucose-Capillary: 224 mg/dL — ABNORMAL HIGH (ref 70–99)
Glucose-Capillary: 116 mg/dL — ABNORMAL HIGH (ref 70–99)
Glucose-Capillary: 176 mg/dL — ABNORMAL HIGH (ref 70–99)

## 2021-09-30 MED ORDER — TORSEMIDE 20 MG PO TABS
20.0000 mg | ORAL_TABLET | Freq: Every day | ORAL | Status: DC
  Administered 2021-10-01: 20 mg via ORAL
  Filled 2021-09-30: qty 1

## 2021-09-30 MED ORDER — LIVING WELL WITH DIABETES BOOK
Freq: Once | Status: AC
  Filled 2021-09-30: qty 1

## 2021-09-30 MED ORDER — POTASSIUM CHLORIDE CRYS ER 10 MEQ PO TBCR
10.0000 meq | EXTENDED_RELEASE_TABLET | Freq: Two times a day (BID) | ORAL | Status: DC
  Administered 2021-10-01: 10 meq via ORAL
  Filled 2021-09-30: qty 1

## 2021-09-30 NOTE — Progress Notes (Signed)
Progress Note  Patient Name: Todd Armstrong Date of Encounter: 09/30/2021  [facility] Cardiologist: [name], MD    Subjective   Todd Armstrong is an 87 year old gentleman with a history of coronary artery disease, hypertension, diabetes mellitus, hyperlipidemia, carotid artery stenosis-status post right CEA, pulmonary hypertension, pulm paroxysmal atrial fibrillation who was admitted with heart failure.  He has diuresed well-over 5 L so far during this hospitalization.  He is feeling quite a bit better. Unfortunately he still eats a fairly high salt diet.  He does not try to add salt but he eats preprepared meals that he takes out of his freezer and puts in the oven or microwave.  We held his IV Lasix because of some slight worsening renal function.  Anticipate starting torsemide 20 mg a day tomorrow.  Inpatient Medications    Scheduled Meds:  apixaban  5 mg Oral BID   budesonide (PULMICORT) nebulizer solution  0.25 mg Nebulization BID   clopidogrel  75 mg Oral Daily   insulin aspart  0-15 Units Subcutaneous TID WC   insulin aspart  0-5 Units Subcutaneous QHS   ipratropium-albuterol  3 mL Nebulization BID   metoprolol tartrate  12.5 mg Oral BID   rosuvastatin  10 mg Oral Daily   tamsulosin  0.4 mg Oral QHS   Continuous Infusions:  sodium chloride     PRN Meds: acetaminophen **OR** acetaminophen, albuterol, ondansetron **OR** ondansetron (ZOFRAN) IV   Vital Signs    Vitals:   09/30/21 0500 09/30/21 0516 09/30/21 0802 09/30/21 0900  BP:  110/71  107/79  Pulse:  93    Resp:  16  19  Temp:  98 F (36.7 C)  98 F (36.7 C)  TempSrc:  Oral    SpO2:  97% 96%   Weight: 64.1 kg     Height:        Intake/Output Summary (Last 24 hours) at 09/30/2021 1138 Last data filed at 09/30/2021 0815 Gross per 24 hour  Intake 660 ml  Output 550 ml  Net 110 ml   Last 3 Weights 09/30/2021 09/29/2021 09/28/2021  Weight (lbs) 141 lb 5 oz 137 lb 5.6 oz 138 lb 3.2 oz  Weight  (kg) 64.1 kg 62.3 kg 62.687 kg      Telemetry    Atrial fib with controlled V response - Personally Reviewed  ECG     - Personally Reviewed  Physical Exam   GEN: No acute distress.   Neck: No JVD Cardiac:  irreg. Irreg.  Respiratory: Clear to auscultation bilaterally. GI: Soft, nontender, non-distended  MS: No edema; No deformity. Neuro:  Nonfocal  Psych: Normal affect   Labs    High Sensitivity Troponin:   Recent Labs  Lab 09/27/21 1411 09/27/21 1617 09/28/21 1453  TROPONINIHS 13 16 28*     Chemistry Recent Labs  Lab 09/28/21 0400 09/28/21 0429 09/29/21 0729 09/30/21 0340  NA 137  --  137 136  K 3.3* 4.0 3.5 4.3  CL 97*  --  95* 97*  CO2 28  --  31 30  GLUCOSE 114*  --  145* 133*  BUN 22  --  25* 29*  CREATININE 1.27*  --  1.43* 1.47*  CALCIUM 9.0  --  9.4 9.2  MG 1.9  --  2.4  --   GFRNONAA 54*  --  47* 46*  ANIONGAP 12  --  11 9    Lipids No results for input(s): CHOL, TRIG, HDL, LABVLDL, LDLCALC, CHOLHDL in the  last 168 hours.  Hematology Recent Labs  Lab 09/27/21 1411 09/28/21 0400  WBC 6.5 6.3  RBC 3.39* 3.48*  HGB 10.2* 10.4*  HCT 32.8* 33.0*  MCV 96.8 94.8  MCH 30.1 29.9  MCHC 31.1 31.5  RDW 16.1* 16.1*  PLT 149* 151   Thyroid  Recent Labs  Lab 09/28/21 1453  TSH 1.377    BNP Recent Labs  Lab 09/27/21 1417  BNP 464.8*    DDimer No results for input(s): DDIMER in the last 168 hours.   Radiology    No results found.  Cardiac Studies     Patient Profile     87 y.o. male   Assessment & Plan    1.  Acute on chronic combined systolic and diastolic congestive heart failure: He is doing well.  He is diuresed over 5 L with IV Lasix.  Anticipate starting him on oral torsemide tomorrow.  Echocardiogram from October 17 reveals mildly depressed left ventricular systolic function with EF of 45 to 50%.  Diastolic parameters were not able to be determined.  He has moderate pulmonary hypertension with an estimated PA pressure  of 54.  2.  Atrial fibrillation: He remains in atrial fibrillation.  Continue Eliquis.  3.  Coronary artery disease: He denies any angina.   For questions or updates, please contact [place] Please consult [email or web address] for contact info under        Signed, [name], MD  09/30/2021, 11:38 AM

## 2021-09-30 NOTE — Progress Notes (Signed)
PROGRESS NOTE    JACOLBY RISBY  TIR:[id number] DOB: 1931-09-28 DOA: 09/27/2021 PCP: Lavone Orn, MD    Brief Narrative:  87 year old male admitted to the hospital with progressive dyspnea on exertion and orthopnea.  Found to have decompensated CHF.  He was admitted for IV diuresis.  Currently having tachycardia/bradycardia episodes.  Cardiology following   Assessment & Plan:   Active Problems:   Hypertension   Hyperlipemia   Persistent atrial fibrillation ([place])   Pulmonary HTN (HCC)   CHF exacerbation (HCC)   Emphysema/COPD (HCC)   Type 2 diabetes mellitus with hyperlipidemia (HCC)  Acute on chronic combined CHF Pulmonary hypertension -Echocardiogram has been updated and indicates a mild decline in EF since 04/2021.  Current EF noted to be 45 to 50% -He has had good urine output with IV Lasix -Since creatinine has trended up, holding further diuretics for today -Monitor intake and output/daily weights -Continue on beta-blockers -Venous Dopplers of left lower extremity negative for DVT -Appreciate cardiology assistance.  Patient was seen at the bedside with Dr. Elmarie Shiley recommendations are for likely starting torsemide tomorrow so long as patient's renal function tolerates.   Emphysema -Chronically on Spiriva -We will continue on inhaled steroids and bronchodilators -No audible wheezing on exam, patient is on minimal O2 support   Persistent atrial fibrillation -He initially was continued on his home dose of diltiazem and Coreg -Patient had relatively sudden development of bradycardia on 10/17 around noon.  Heart rate in the high 40s to low 50s.  EKG shows slow A. fib versus junctional rhythm.  Patient appears to be asymptomatic, no chest pain or shortness of breath -Diltiazem was noted to have been discontinued with patient now on metoprolol 12.5 mg p.o. twice daily.   Type 2 diabetes -Holding oral agents while in hospital -A1c 6.3 -Continue on sliding scale insulin  as needed   Hyperlipidemia -Continue statin   DVT prophylaxis: Eliquis Code Status: DNR Family Communication: Pt in room, family not at bedside  Status is: Inpatient  Remains inpatient appropriate because: severity of illness    Consultants:  Cardiology  Procedures:    Antimicrobials: Anti-infectives (From admission, onward)    None       Subjective: Eager to go home soon. Reports feeling better today  Objective: Vitals:   09/30/21 0500 09/30/21 0516 09/30/21 0802 09/30/21 0900  BP:  110/71  107/79  Pulse:  93    Resp:  16  19  Temp:  98 F (36.7 C)  98 F (36.7 C)  TempSrc:  Oral    SpO2:  97% 96%   Weight: 64.1 kg     Height:        Intake/Output Summary (Last 24 hours) at 09/30/2021 1645 Last data filed at 09/30/2021 0815 Gross per 24 hour  Intake 420 ml  Output 350 ml  Net 70 ml   Filed Weights   09/28/21 0434 09/29/21 0448 09/30/21 0500  Weight: 62.7 kg 62.3 kg 64.1 kg    Examination: General exam: Awake, laying in bed, in nad Respiratory system: Normal respiratory effort, no wheezing Cardiovascular system: regular rate, s1, s2 Gastrointestinal system: Soft, nondistended, positive BS Central nervous system: CN2-12 grossly intact, strength intact Extremities: Perfused, no clubbing Skin: Normal skin turgor, no notable skin lesions seen Psychiatry: Mood normal // no visual hallucinations   Data Reviewed: I have personally reviewed following labs and imaging studies  CBC: Recent Labs  Lab 09/27/21 1411 09/28/21 0400  WBC 6.5 6.3  NEUTROABS 4.4  --  HGB 10.2* 10.4*  HCT 32.8* 33.0*  MCV 96.8 94.8  PLT 149* 268   Basic Metabolic Panel: Recent Labs  Lab 09/27/21 1411 09/28/21 0400 09/28/21 0429 09/29/21 0729 09/30/21 0340  NA 133* 137  --  137 136  K 4.4 3.3* 4.0 3.5 4.3  CL 104 97*  --  95* 97*  CO2 21* 28  --  31 30  GLUCOSE 125* 114*  --  145* 133*  BUN 18 22  --  25* 29*  CREATININE 1.09 1.27*  --  1.43* 1.47*   CALCIUM 8.7* 9.0  --  9.4 9.2  MG  --  1.9  --  2.4  --   PHOS  --  4.7*  --   --   --    GFR: Estimated Creatinine Clearance: 31.5 mL/min (A) (by C-G formula based on SCr of 1.47 mg/dL (H)). Liver Function Tests: No results for input(s): AST, ALT, ALKPHOS, BILITOT, PROT, ALBUMIN in the last 168 hours. No results for input(s): LIPASE, AMYLASE in the last 168 hours. No results for input(s): AMMONIA in the last 168 hours. Coagulation Profile: No results for input(s): INR, PROTIME in the last 168 hours. Cardiac Enzymes: No results for input(s): CKTOTAL, CKMB, CKMBINDEX, TROPONINI in the last 168 hours. BNP (last 3 results) No results for input(s): PROBNP in the last 8760 hours. HbA1C: Recent Labs    09/28/21 0400  HGBA1C 6.3*   CBG: Recent Labs  Lab 09/29/21 1744 09/29/21 2005 09/30/21 0734 09/30/21 1111 09/30/21 1638  GLUCAP 134* 153* 124* 224* 116*   Lipid Profile: No results for input(s): CHOL, HDL, LDLCALC, TRIG, CHOLHDL, LDLDIRECT in the last 72 hours. Thyroid Function Tests: Recent Labs    09/28/21 1453  TSH 1.377   Anemia Panel: No results for input(s): VITAMINB12, FOLATE, FERRITIN, TIBC, IRON, RETICCTPCT in the last 72 hours. Sepsis Labs: No results for input(s): PROCALCITON, LATICACIDVEN in the last 168 hours.  Recent Results (from the past 240 hour(s))  Resp Panel by RT-PCR (Flu A&B, Covid) Nasopharyngeal Swab     Status: None   Collection Time: 09/27/21  2:17 PM   Specimen: Nasopharyngeal Swab; Nasopharyngeal(NP) swabs in vial transport medium  Result Value Ref Range Status   SARS Coronavirus 2 by RT PCR NEGATIVE NEGATIVE Final    Comment: (NOTE) SARS-CoV-2 target nucleic acids are NOT DETECTED.  The SARS-CoV-2 RNA is generally detectable in upper respiratory specimens during the acute phase of infection. The lowest concentration of SARS-CoV-2 viral copies this assay can detect is 138 copies/mL. A negative result does not preclude  SARS-Cov-2 infection and should not be used as the sole basis for treatment or other patient management decisions. A negative result may occur with  improper specimen collection/handling, submission of specimen other than nasopharyngeal swab, presence of viral mutation(s) within the areas targeted by this assay, and inadequate number of viral copies(<138 copies/mL). A negative result must be combined with clinical observations, patient history, and epidemiological information. The expected result is Negative.  Fact Sheet for Patients:  EntrepreneurPulse.com.au  Fact Sheet for Healthcare Providers:  IncredibleEmployment.be  This test is no t yet approved or cleared by the [place] FDA and  has been authorized for detection and/or diagnosis of SARS-CoV-2 by FDA under an Emergency Use Authorization (EUA). This EUA will remain  in effect (meaning this test can be used) for the duration of the COVID-19 declaration under Section 564(b)(1) of the Act, 21 U.S.C.section 360bbb-3(b)(1), unless the authorization is terminated  or revoked  sooner.       Influenza A by PCR NEGATIVE NEGATIVE Final   Influenza B by PCR NEGATIVE NEGATIVE Final    Comment: (NOTE) The Xpert Xpress SARS-CoV-2/FLU/RSV plus assay is intended as an aid in the diagnosis of influenza from Nasopharyngeal swab specimens and should not be used as a sole basis for treatment. Nasal washings and aspirates are unacceptable for Xpert Xpress SARS-CoV-2/FLU/RSV testing.  Fact Sheet for Patients: EntrepreneurPulse.com.au  Fact Sheet for Healthcare Providers: IncredibleEmployment.be  This test is not yet approved or cleared by the [place] FDA and has been authorized for detection and/or diagnosis of SARS-CoV-2 by FDA under an Emergency Use Authorization (EUA). This EUA will remain in effect (meaning this test can be used) for the duration of  the COVID-19 declaration under Section 564(b)(1) of the Act, 21 U.S.C. section 360bbb-3(b)(1), unless the authorization is terminated or revoked.  Performed at [facility], [place] [address], [place], [place] [address]      Radiology Studies: No results found.  Scheduled Meds:  apixaban  5 mg Oral BID   budesonide (PULMICORT) nebulizer solution  0.25 mg Nebulization BID   clopidogrel  75 mg Oral Daily   insulin aspart  0-15 Units Subcutaneous TID WC   insulin aspart  0-5 Units Subcutaneous QHS   ipratropium-albuterol  3 mL Nebulization BID   metoprolol tartrate  12.5 mg Oral BID   [START ON 10/01/2021] potassium chloride  10 mEq Oral BID   rosuvastatin  10 mg Oral Daily   tamsulosin  0.4 mg Oral QHS   [START ON 10/01/2021] torsemide  20 mg Oral Daily   Continuous Infusions:  sodium chloride       LOS: 2 days   Marylu Lund, MD [facility] Pager On Amion  If 7PM-7AM, please contact night-coverage 09/30/2021, 4:45 PM

## 2021-10-01 ENCOUNTER — Other Ambulatory Visit: Payer: Self-pay | Admitting: Physician Assistant

## 2021-10-01 DIAGNOSIS — I5032 Chronic diastolic (congestive) heart failure: Secondary | ICD-10-CM

## 2021-10-01 DIAGNOSIS — I4819 Other persistent atrial fibrillation: Secondary | ICD-10-CM | POA: Diagnosis not present

## 2021-10-01 DIAGNOSIS — I5043 Acute on chronic combined systolic (congestive) and diastolic (congestive) heart failure: Secondary | ICD-10-CM | POA: Diagnosis not present

## 2021-10-01 DIAGNOSIS — I2510 Atherosclerotic heart disease of native coronary artery without angina pectoris: Secondary | ICD-10-CM | POA: Diagnosis not present

## 2021-10-01 LAB — COMPREHENSIVE METABOLIC PANEL
ALT: 16 U/L (ref 0–44)
AST: 22 U/L (ref 15–41)
Albumin: 3.9 g/dL (ref 3.5–5.0)
Alkaline Phosphatase: 66 U/L (ref 38–126)
Anion gap: 8 (ref 5–15)
BUN: 29 mg/dL — ABNORMAL HIGH (ref 8–23)
CO2: 31 mmol/L (ref 22–32)
Calcium: 9.2 mg/dL (ref 8.9–10.3)
Chloride: 97 mmol/L — ABNORMAL LOW (ref 98–111)
Creatinine, Ser: 1.02 mg/dL (ref 0.61–1.24)
GFR, Estimated: >60 mL/min (ref >60)
Glucose, Bld: 124 mg/dL — ABNORMAL HIGH (ref 70–99)
Potassium: 3.7 mmol/L (ref 3.5–5.1)
Sodium: 136 mmol/L (ref 135–145)
Total Bilirubin: 1 mg/dL (ref 0.3–1.2)
Total Protein: 6.8 g/dL (ref 6.5–8.1)

## 2021-10-01 LAB — GLUCOSE, CAPILLARY: Glucose-Capillary: 134 mg/dL — ABNORMAL HIGH (ref 70–99)

## 2021-10-01 MED ORDER — POTASSIUM CHLORIDE CRYS ER 20 MEQ PO TBCR
20.0000 meq | EXTENDED_RELEASE_TABLET | Freq: Once | ORAL | Status: AC
  Administered 2021-10-01: 20 meq via ORAL
  Filled 2021-10-01: qty 1

## 2021-10-01 MED ORDER — METOPROLOL TARTRATE 25 MG PO TABS: 12.5000 mg | ORAL_TABLET | Freq: Two times a day (BID) | ORAL | 0 refills | Status: DC

## 2021-10-01 MED ORDER — POTASSIUM CHLORIDE CRYS ER 10 MEQ PO TBCR: 10.0000 meq | EXTENDED_RELEASE_TABLET | Freq: Two times a day (BID) | ORAL | 0 refills | Status: AC

## 2021-10-01 MED ORDER — TORSEMIDE 20 MG PO TABS: 20.0000 mg | ORAL_TABLET | Freq: Every day | ORAL | 0 refills | Status: DC

## 2021-10-01 NOTE — Discharge Summary (Signed)
Physician Discharge Summary  Todd Armstrong XKG:[id number] DOB: [birth year] DOA: 09/27/2021  PCP: Lavone Orn, MD  Admit date: 09/27/2021 Discharge date: 10/01/2021  Admitted From: Home Disposition:  Home  Recommendations for Outpatient Follow-up:  Follow up with PCP in 1-2 weeks Follow up with Cardiology as scheduled  Discharge Condition:Improved CODE STATUS:DNR Diet recommendation: Heart healthy, diabetic   Brief/Interim Summary: 87 year old male admitted to the hospital with progressive dyspnea on exertion and orthopnea.  Found to have decompensated CHF.  He was admitted for IV diuresis.  Currently having tachycardia/bradycardia episodes.  Cardiology had been following   Discharge Diagnoses:  Active Problems:   Hypertension   Hyperlipemia   Persistent atrial fibrillation ([place])   Pulmonary HTN (HCC)   CHF exacerbation (HCC)   Emphysema/COPD (HCC)   Type 2 diabetes mellitus with hyperlipidemia (HCC)  Acute on chronic combined CHF Pulmonary hypertension -Echocardiogram has been updated and indicates a mild decline in EF since 04/2021.  Current EF noted to be 45 to 50% -He has had good urine output with IV Lasix -Continue on beta-blockers -Venous Dopplers of left lower extremity negative for DVT -Appreciate cardiology assistance.  Recommendations for restarting torsemide on day of d/c with kcl 66meq bid and to double his torsemide for a couple days should he become volume overloaded -Pt to f/u with Cardiology as scheduled   Emphysema -Chronically on Spiriva -We will continue on inhaled steroids and bronchodilators -No audible wheezing on exam, patient is on minimal O2 support   Persistent atrial fibrillation -He initially was continued on his home dose of diltiazem and Coreg -Patient had relatively sudden development of bradycardia on 10/17 around noon.  Heart rate in the high 40s to low 50s.  EKG shows slow A. fib versus junctional rhythm.  Patient appears to be  asymptomatic, no chest pain or shortness of breath -Diltiazem was noted to have been discontinued with patient now on metoprolol 12.5 mg p.o. twice daily.   Type 2 diabetes -Holding oral agents while in hospital -A1c 6.3   Hyperlipidemia -Continue statin   Discharge Instructions   Allergies as of 10/01/2021   No Known Allergies      Medication List     STOP taking these medications    carvedilol 12.5 MG tablet Commonly known as: COREG   diltiazem 120 MG tablet Commonly known as: CARDIZEM   furosemide 40 MG tablet Commonly known as: LASIX   potassium chloride 10 MEQ tablet Commonly known as: KLOR-CON       TAKE these medications    albuterol 108 (90 Base) MCG/ACT inhaler Commonly known as: VENTOLIN HFA Inhale 2 puffs into the lungs every 6 (six) hours as needed for wheezing or shortness of breath.   clopidogrel 75 MG tablet Commonly known as: PLAVIX TAKE 1 TABLET BY MOUTH  DAILY What changed: when to take this   Eliquis 5 MG Tabs tablet Generic drug: apixaban Take 1 tablet (5 mg total) by mouth 2 (two) times daily.   metFORMIN 1000 MG tablet Commonly known as: GLUCOPHAGE Take 1,000 mg by mouth 2 (two) times daily with a meal.   metoprolol tartrate 25 MG tablet Commonly known as: LOPRESSOR Take 0.5 tablets (12.5 mg total) by mouth 2 (two) times daily.   nitroGLYCERIN 0.4 MG SL tablet Commonly known as: NITROSTAT Place 1 tablet (0.4 mg total) under the tongue every 5 (five) minutes as needed for chest pain.   potassium chloride 10 MEQ tablet Commonly known as: KLOR-CON Take 1 tablet (10 mEq total)  by mouth 2 (two) times daily.   rosuvastatin 10 MG tablet Commonly known as: CRESTOR TAKE 1 TABLET BY MOUTH  DAILY   Spiriva Respimat 2.5 MCG/ACT Aers Generic drug: Tiotropium Bromide Monohydrate Inhale 2 puffs into the lungs daily.   tamsulosin 0.4 MG Caps capsule Commonly known as: FLOMAX Take 0.4 mg by mouth at bedtime.   torsemide 20 MG  tablet Commonly known as: DEMADEX Take 1 tablet (20 mg total) by mouth daily.   triamcinolone cream 0.1 % Commonly known as: KENALOG Apply 1 application topically as needed (for itching).        Follow-up Information     Lavone Orn, MD Follow up in 2 week(s).   Specialty: Internal Medicine Why: Hospital follow up Contact information: [address], [address] [place] [address] [phone]         Nahser, Wonda Cheng, MD .   Specialty: Cardiology Contact information: [address] [place] [address] [phone]                No Known Allergies  Consultations: Cardiology  Procedures/Studies: DG Chest 2 View  Result Date: 09/27/2021 CLINICAL DATA:  Shortness of breath EXAM: CHEST - 2 VIEW COMPARISON:  01/26/2021 FINDINGS: Small right pleural effusion. Right base atelectasis. Mild hyperinflation of the lungs. Heart is normal size. No acute bony abnormality. IMPRESSION: Small right pleural effusion with right base atelectasis. Electronically Signed   By: Rolm Baptise M.D.   On: 09/27/2021 15:03   ECHOCARDIOGRAM COMPLETE  Result Date: 09/28/2021    ECHOCARDIOGRAM REPORT   Patient Name:   Todd Armstrong Date of Exam: 09/28/2021 Medical Rec #:  [id number]         Height:       68.0 in Accession #:    [id number]        Weight:       138.2 lb Date of Birth:  Sep 24, 1931         BSA:          1.747 m Patient Age:    6 years          BP:           110/75 mmHg Patient Gender: M                 HR:           100 bpm. Exam Location:  Inpatient Procedure: 2D Echo, Cardiac Doppler and Color Doppler Indications:    Congestive Heart Failure I50.9  History:        Patient has prior history of Echocardiogram examinations, most                 recent 05/08/2021. CAD and Previous Myocardial Infarction,                 Pulmonary HTN and Stroke; Risk Factors:Hypertension,                 Dyslipidemia and Diabetes.  Sonographer:    Bernadene Person RDCS  Referring Phys: [place]  1. Left ventricular ejection fraction, by estimation, is 45 to 50%. The left ventricle has mildly decreased function. The left ventricle demonstrates global hypokinesis. Left ventricular diastolic parameters are indeterminate.  2. Right ventricular systolic function is normal. The right ventricular size is normal. There is moderately elevated pulmonary artery systolic pressure. The estimated right ventricular systolic pressure is 02.5 mmHg.  3. Left atrial size was moderately dilated.  4. The mitral valve is normal in structure. Mild mitral valve regurgitation. No evidence of mitral stenosis.  5. The aortic valve is calcified. There is moderate calcification of the aortic valve. Aortic valve regurgitation is mild. Mild to moderate aortic valve sclerosis/calcification is present, without any evidence of aortic stenosis.  6. The inferior vena cava is normal in size with greater than 50% respiratory variability, suggesting right atrial pressure of 3 mmHg. FINDINGS  Left Ventricle: Left ventricular ejection fraction, by estimation, is 45 to 50%. The left ventricle has mildly decreased function. The left ventricle demonstrates global hypokinesis. The left ventricular internal cavity size was normal in size. There is  no left ventricular hypertrophy. Left ventricular diastolic parameters are indeterminate. Right Ventricle: The right ventricular size is normal. No increase in right ventricular wall thickness. Right ventricular systolic function is normal. There is moderately elevated pulmonary artery systolic pressure. The tricuspid regurgitant velocity is 3.57 m/s, and with an assumed right atrial pressure of 3 mmHg, the estimated right ventricular systolic pressure is 29.9 mmHg. Left Atrium: Left atrial size was moderately dilated. Right Atrium: Right atrial size was normal in size. Pericardium: There is no evidence of pericardial effusion. Mitral Valve: The mitral valve  is normal in structure. Mild mitral valve regurgitation. No evidence of mitral valve stenosis. Tricuspid Valve: The tricuspid valve is normal in structure. Tricuspid valve regurgitation is mild . No evidence of tricuspid stenosis. Aortic Valve: The aortic valve is calcified. There is moderate calcification of the aortic valve. Aortic valve regurgitation is mild. Aortic regurgitation PHT measures 447 msec. Mild to moderate aortic valve sclerosis/calcification is present, without any evidence of aortic stenosis. Pulmonic Valve: The pulmonic valve was normal in structure. Pulmonic valve regurgitation is trivial. No evidence of pulmonic stenosis. Aorta: The aortic root is normal in size and structure. Venous: The inferior vena cava is normal in size with greater than 50% respiratory variability, suggesting right atrial pressure of 3 mmHg. IAS/Shunts: No atrial level shunt detected by color flow Doppler.  LEFT VENTRICLE PLAX 2D LVIDd:         4.10 cm LVIDs:         3.10 cm LV PW:         1.00 cm LV IVS:        1.10 cm LVOT diam:     2.10 cm LV SV:         56 LV SV Index:   32 LVOT Area:     3.46 cm  LV Volumes (MOD) LV vol d, MOD A2C: 109.0 ml LV vol d, MOD A4C: 128.0 ml LV vol s, MOD A2C: 49.8 ml LV vol s, MOD A4C: 69.5 ml LV SV MOD A2C:     59.2 ml LV SV MOD A4C:     128.0 ml LV SV MOD BP:      60.8 ml RIGHT VENTRICLE TAPSE (M-mode): 1.4 cm LEFT ATRIUM             Index        RIGHT ATRIUM           Index LA diam:        3.00 cm 1.72 cm/m   RA Area:     14.30 cm LA Vol (A2C):   76.2 ml 43.63 ml/m  RA Volume:   31.90 ml  18.26 ml/m LA Vol (A4C):   65.9 ml 37.73 ml/m LA Biplane Vol: 74.0 ml 42.37 ml/m  AORTIC VALVE LVOT Vmax:   92.93 cm/s LVOT  Vmean:  60.633 cm/s LVOT VTI:    0.160 m AI PHT:      447 msec  AORTA Ao Asc diam: 3.60 cm MR Peak grad:    125.4 mmHg   TRICUSPID VALVE MR Mean grad:    88.0 mmHg    TR Peak grad:   51.0 mmHg MR Vmax:         560.00 cm/s  TR Vmax:        357.00 cm/s MR Vmean:        448.0  cm/s MR PISA:         0.57 cm     SHUNTS MR PISA Eff ROA: 4 mm        Systemic VTI:  0.16 m MR PISA Radius:  0.30 cm      Systemic Diam: 2.10 cm Candee Furbish MD Electronically signed by Candee Furbish MD Signature Date/Time: 09/28/2021/11:23:54 AM    Final    VAS US CAROTID  Result Date: 09/15/2021 Carotid Arterial Duplex Study Patient Name:  Todd Armstrong  Date of Exam:   09/15/2021 Medical Rec #: [id number]          Accession #:    [id number] Date of Birth: 08-29-1931          Patient Gender: M Patient Age:   88 years Exam Location:  [facility] Procedure:      VAS US CAROTID Referring Phys: Monica Martinez --------------------------------------------------------------------------------  Indications:       Right endarterectomy. 03/23/2018 Risk Factors:      Hyperlipidemia, prior MI, coronary artery disease. Comparison Study:  09/02/2020                    R=40-59%, L=1-39% Performing Technologist: Ronal Fear RVS, RCS  Examination Guidelines: A complete evaluation includes B-mode imaging, spectral Doppler, color Doppler, and power Doppler as needed of all accessible portions of each vessel. Bilateral testing is considered an integral part of a complete examination. Limited examinations for reoccurring indications may be performed as noted.  Right Carotid Findings: +----------+--------+--------+--------+------------------+--------+           PSV cm/sEDV cm/sStenosisPlaque DescriptionComments +----------+--------+--------+--------+------------------+--------+ CCA Prox  78      14                                         +----------+--------+--------+--------+------------------+--------+ CCA Mid   68      17                                         +----------+--------+--------+--------+------------------+--------+ CCA Distal75      16                                         +----------+--------+--------+--------+------------------+--------+ ICA Prox  126     35       1-39%   smooth                     +----------+--------+--------+--------+------------------+--------+ ICA Mid   161     51      40-59%  homogeneous                +----------+--------+--------+--------+------------------+--------+ ICA Distal97  31                                         +----------+--------+--------+--------+------------------+--------+ ECA       79                                                 +----------+--------+--------+--------+------------------+--------+ +----------+--------+-------+----------------+-------------------+           PSV cm/sEDV cmsDescribe        Arm Pressure (mmHG) +----------+--------+-------+----------------+-------------------+ [id number]            Multiphasic, WNL                    +----------+--------+-------+----------------+-------------------+ +---------+--------+--+--------+--+---------+ VertebralPSV cm/s62EDV cm/s18Antegrade +---------+--------+--+--------+--+---------+  Left Carotid Findings: +----------+--------+--------+--------+----------------------+--------+           PSV cm/sEDV cm/sStenosisPlaque Description    Comments +----------+--------+--------+--------+----------------------+--------+ CCA Prox  107     28                                             +----------+--------+--------+--------+----------------------+--------+ CCA Mid   60      24                                             +----------+--------+--------+--------+----------------------+--------+ CCA Distal83      33              heterogenous                   +----------+--------+--------+--------+----------------------+--------+ ICA Prox  118     28      1-39%   calcific and irregular         +----------+--------+--------+--------+----------------------+--------+ ICA Mid   100     27                                              +----------+--------+--------+--------+----------------------+--------+ ICA Distal67      22                                             +----------+--------+--------+--------+----------------------+--------+ ECA       106                                                    +----------+--------+--------+--------+----------------------+--------+ +----------+--------+--------+----------------+-------------------+           PSV cm/sEDV cm/sDescribe        Arm Pressure (mmHG) +----------+--------+--------+----------------+-------------------+ [id number]              Multiphasic, WNL                    +----------+--------+--------+----------------+-------------------+ +---------+--------+--+--------+--+---------+ VertebralPSV cm/s67EDV  cm/s15Antegrade +---------+--------+--+--------+--+---------+   Summary: Right Carotid: Velocities in the right ICA are consistent with a 40-59%                stenosis. Left Carotid: Velocities in the left ICA are consistent with a 1-39% stenosis. Vertebrals:  Bilateral vertebral arteries demonstrate antegrade flow. Subclavians: Normal flow hemodynamics were seen in bilateral subclavian              arteries. *See table(s) above for measurements and observations.  Electronically signed by Monica Martinez MD on 09/15/2021 at 1:14:31 PM.    Final    VAS [place] LOWER EXTREMITY VENOUS (DVT)  Result Date: 09/28/2021  Lower Venous DVT Study Patient Name:  Todd Armstrong  Date of Exam:   09/28/2021 Medical Rec #: [id number]          Accession #:    [id number] Date of Birth: 1931-03-06          Patient Gender: M Patient Age:   51 years Exam Location:  [facility] Procedure:      VAS [place] LOWER EXTREMITY VENOUS (DVT) Referring Phys: Jolaine Artist MEMON --------------------------------------------------------------------------------  Comparison Study: No previous exams Performing Technologist: Jody Hill RVT, RDMS  Examination Guidelines: A complete evaluation  includes B-mode imaging, spectral Doppler, color Doppler, and power Doppler as needed of all accessible portions of each vessel. Bilateral testing is considered an integral part of a complete examination. Limited examinations for reoccurring indications may be performed as noted. The reflux portion of the exam is performed with the patient in reverse Trendelenburg.  +-----+---------------+---------+-----------+----------+--------------+ RIGHTCompressibilityPhasicitySpontaneityPropertiesThrombus Aging +-----+---------------+---------+-----------+----------+--------------+ CFV  Full           Yes      Yes                                 +-----+---------------+---------+-----------+----------+--------------+   +---------+---------------+---------+-----------+----------+--------------+ LEFT     CompressibilityPhasicitySpontaneityPropertiesThrombus Aging +---------+---------------+---------+-----------+----------+--------------+ CFV      Full           Yes      Yes                                 +---------+---------------+---------+-----------+----------+--------------+ SFJ      Full                                                        +---------+---------------+---------+-----------+----------+--------------+ FV Prox  Full           Yes      Yes                                 +---------+---------------+---------+-----------+----------+--------------+ FV Mid   Full           Yes      Yes                                 +---------+---------------+---------+-----------+----------+--------------+ FV DistalFull           Yes      Yes                                 +---------+---------------+---------+-----------+----------+--------------+  PFV      Full                                                        +---------+---------------+---------+-----------+----------+--------------+ POP      Full           Yes      Yes                                  +---------+---------------+---------+-----------+----------+--------------+ PTV      Full                                                        +---------+---------------+---------+-----------+----------+--------------+ PERO     Full                                                        +---------+---------------+---------+-----------+----------+--------------+     Summary: RIGHT: - No evidence of common femoral vein obstruction.  LEFT: - There is no evidence of deep vein thrombosis in the lower extremity. - There is no evidence of superficial venous thrombosis.  - No cystic structure found in the popliteal fossa.  *See table(s) above for measurements and observations. Electronically signed by Servando Snare MD on 09/28/2021 at 4:35:00 PM.    Final     Subjective: Eager to go home  Discharge Exam: Vitals:   10/01/21 0505 10/01/21 0952  BP: 125/69 122/68  Pulse: 94 90  Resp: 16   Temp: 98.1 F (36.7 C)   SpO2: 100%    Vitals:   09/30/21 2009 10/01/21 0500 10/01/21 0505 10/01/21 0952  BP: 128/62  125/69 122/68  Pulse: 89  94 90  Resp: 16  16   Temp: 97.7 F (36.5 C)  98.1 F (36.7 C)   TempSrc: Oral  Oral   SpO2: 98%  100%   Weight:  64.5 kg    Height:        General: Pt is alert, awake, not in acute distress Cardiovascular: RRR, S1/S2 + Respiratory: CTA bilaterally, no wheezing, no rhonchi Abdominal: Soft, NT, ND, bowel sounds + Extremities: no edema, no cyanosis   The results of significant diagnostics from this hospitalization (including imaging, microbiology, ancillary and laboratory) are listed below for reference.     Microbiology: Recent Results (from the past 240 hour(s))  Resp Panel by RT-PCR (Flu A&B, Covid) Nasopharyngeal Swab     Status: None   Collection Time: 09/27/21  2:17 PM   Specimen: Nasopharyngeal Swab; Nasopharyngeal(NP) swabs in vial transport medium  Result Value Ref Range Status   SARS Coronavirus 2 by RT PCR NEGATIVE NEGATIVE Final     Comment: (NOTE) SARS-CoV-2 target nucleic acids are NOT DETECTED.  The SARS-CoV-2 RNA is generally detectable in upper respiratory specimens during the acute phase of infection. The lowest concentration of SARS-CoV-2 viral copies this assay can detect is 138 copies/mL. A negative result does not preclude SARS-Cov-2 infection and  should not be used as the sole basis for treatment or other patient management decisions. A negative result may occur with  improper specimen collection/handling, submission of specimen other than nasopharyngeal swab, presence of viral mutation(s) within the areas targeted by this assay, and inadequate number of viral copies(<138 copies/mL). A negative result must be combined with clinical observations, patient history, and epidemiological information. The expected result is Negative.  Fact Sheet for Patients:  EntrepreneurPulse.com.au  Fact Sheet for Healthcare Providers:  IncredibleEmployment.be  This test is no t yet approved or cleared by the [place] FDA and  has been authorized for detection and/or diagnosis of SARS-CoV-2 by FDA under an Emergency Use Authorization (EUA). This EUA will remain  in effect (meaning this test can be used) for the duration of the COVID-19 declaration under Section 564(b)(1) of the Act, 21 U.S.C.section 360bbb-3(b)(1), unless the authorization is terminated  or revoked sooner.       Influenza A by PCR NEGATIVE NEGATIVE Final   Influenza B by PCR NEGATIVE NEGATIVE Final    Comment: (NOTE) The Xpert Xpress SARS-CoV-2/FLU/RSV plus assay is intended as an aid in the diagnosis of influenza from Nasopharyngeal swab specimens and should not be used as a sole basis for treatment. Nasal washings and aspirates are unacceptable for Xpert Xpress SARS-CoV-2/FLU/RSV testing.  Fact Sheet for Patients: EntrepreneurPulse.com.au  Fact Sheet for Healthcare  Providers: IncredibleEmployment.be  This test is not yet approved or cleared by the [place] FDA and has been authorized for detection and/or diagnosis of SARS-CoV-2 by FDA under an Emergency Use Authorization (EUA). This EUA will remain in effect (meaning this test can be used) for the duration of the COVID-19 declaration under Section 564(b)(1) of the Act, 21 U.S.C. section 360bbb-3(b)(1), unless the authorization is terminated or revoked.  Performed at [facility], [place] [address] [place], [place] [address]      Labs: BNP (last 3 results) Recent Labs    09/27/21 1417  BNP 235.5*   Basic Metabolic Panel: Recent Labs  Lab 09/27/21 1411 09/28/21 0400 09/28/21 0429 09/29/21 0729 09/30/21 0340 10/01/21 0338  NA 133* 137  --  137 136 136  K 4.4 3.3* 4.0 3.5 4.3 3.7  CL 104 97*  --  95* 97* 97*  CO2 21* 28  --  31 30 31   GLUCOSE 125* 114*  --  145* 133* 124*  BUN 18 22  --  25* 29* 29*  CREATININE 1.09 1.27*  --  1.43* 1.47* 1.02  CALCIUM 8.7* 9.0  --  9.4 9.2 9.2  MG  --  1.9  --  2.4  --   --   PHOS  --  4.7*  --   --   --   --    Liver Function Tests: Recent Labs  Lab 10/01/21 0338  AST 22  ALT 16  ALKPHOS 66  BILITOT 1.0  PROT 6.8  ALBUMIN 3.9   No results for input(s): LIPASE, AMYLASE in the last 168 hours. No results for input(s): AMMONIA in the last 168 hours. CBC: Recent Labs  Lab 09/27/21 1411 09/28/21 0400  WBC 6.5 6.3  NEUTROABS 4.4  --   HGB 10.2* 10.4*  HCT 32.8* 33.0*  MCV 96.8 94.8  PLT 149* 151   Cardiac Enzymes: No results for input(s): CKTOTAL, CKMB, CKMBINDEX, TROPONINI in the last 168 hours. BNP: Invalid input(s): POCBNP CBG: Recent Labs  Lab 09/30/21 0734 09/30/21 1111 09/30/21 1638 09/30/21 2005 10/01/21 0724  GLUCAP 124*  224* 116* 176* 134*   D-Dimer No results for input(s): DDIMER in the last 72 hours. Hgb A1c No results for input(s): HGBA1C in the last 72 hours. Lipid  Profile No results for input(s): CHOL, HDL, LDLCALC, TRIG, CHOLHDL, LDLDIRECT in the last 72 hours. Thyroid function studies Recent Labs    09/28/21 1453  TSH 1.377   Anemia work up No results for input(s): VITAMINB12, FOLATE, FERRITIN, TIBC, IRON, RETICCTPCT in the last 72 hours. Urinalysis    Component Value Date/Time   COLORURINE YELLOW 03/17/2018 1420   APPEARANCEUR CLEAR 03/17/2018 1420   LABSPEC 1.014 03/17/2018 1420   PHURINE 5.0 03/17/2018 1420   GLUCOSEU NEGATIVE 03/17/2018 1420   HGBUR SMALL (A) 03/17/2018 1420   BILIRUBINUR NEGATIVE 03/17/2018 1420   KETONESUR NEGATIVE 03/17/2018 1420   PROTEINUR NEGATIVE 03/17/2018 1420   NITRITE NEGATIVE 03/17/2018 1420   LEUKOCYTESUR NEGATIVE 03/17/2018 1420   Sepsis Labs Invalid input(s): PROCALCITONIN,  WBC,  LACTICIDVEN Microbiology Recent Results (from the past 240 hour(s))  Resp Panel by RT-PCR (Flu A&B, Covid) Nasopharyngeal Swab     Status: None   Collection Time: 09/27/21  2:17 PM   Specimen: Nasopharyngeal Swab; Nasopharyngeal(NP) swabs in vial transport medium  Result Value Ref Range Status   SARS Coronavirus 2 by RT PCR NEGATIVE NEGATIVE Final    Comment: (NOTE) SARS-CoV-2 target nucleic acids are NOT DETECTED.  The SARS-CoV-2 RNA is generally detectable in upper respiratory specimens during the acute phase of infection. The lowest concentration of SARS-CoV-2 viral copies this assay can detect is 138 copies/mL. A negative result does not preclude SARS-Cov-2 infection and should not be used as the sole basis for treatment or other patient management decisions. A negative result may occur with  improper specimen collection/handling, submission of specimen other than nasopharyngeal swab, presence of viral mutation(s) within the areas targeted by this assay, and inadequate number of viral copies(<138 copies/mL). A negative result must be combined with clinical observations, patient history, and  epidemiological information. The expected result is Negative.  Fact Sheet for Patients:  EntrepreneurPulse.com.au  Fact Sheet for Healthcare Providers:  IncredibleEmployment.be  This test is no t yet approved or cleared by the [place] FDA and  has been authorized for detection and/or diagnosis of SARS-CoV-2 by FDA under an Emergency Use Authorization (EUA). This EUA will remain  in effect (meaning this test can be used) for the duration of the COVID-19 declaration under Section 564(b)(1) of the Act, 21 U.S.C.section 360bbb-3(b)(1), unless the authorization is terminated  or revoked sooner.       Influenza A by PCR NEGATIVE NEGATIVE Final   Influenza B by PCR NEGATIVE NEGATIVE Final    Comment: (NOTE) The Xpert Xpress SARS-CoV-2/FLU/RSV plus assay is intended as an aid in the diagnosis of influenza from Nasopharyngeal swab specimens and should not be used as a sole basis for treatment. Nasal washings and aspirates are unacceptable for Xpert Xpress SARS-CoV-2/FLU/RSV testing.  Fact Sheet for Patients: EntrepreneurPulse.com.au  Fact Sheet for Healthcare Providers: IncredibleEmployment.be  This test is not yet approved or cleared by the [place] FDA and has been authorized for detection and/or diagnosis of SARS-CoV-2 by FDA under an Emergency Use Authorization (EUA). This EUA will remain in effect (meaning this test can be used) for the duration of the COVID-19 declaration under Section 564(b)(1) of the Act, 21 U.S.C. section 360bbb-3(b)(1), unless the authorization is terminated or revoked.  Performed at [facility], [place] [address], [place],  [address]  Time spent: 30 min  SIGNED:   Marylu Lund, MD  [facility] 10/01/2021, 9:53 AM  If 7PM-7AM, please contact night-coverage

## 2021-10-01 NOTE — Progress Notes (Signed)
Progress Note  Patient Name: Todd Armstrong Date of Encounter: 10/01/2021  [facility] Cardiologist: [name], MD    Subjective   Todd Armstrong is an 87 year old gentleman with a history of coronary artery disease, hypertension, diabetes mellitus, hyperlipidemia, carotid artery stenosis-status post right CEA, pulmonary hypertension, pulm paroxysmal atrial fibrillation who was admitted with heart failure.  He has diuresed 6.5 liters so far during this hospitalization .   Unfortunately he still eats a fairly high salt diet.  He does not try to add salt but he eats preprepared meals that he takes out of his freezer and puts in the oven or microwave.  Starting torsemide 20 mg po today  He is feeling great.   Ready to go home  We had a long discussion about low salt foods   Inpatient Medications    Scheduled Meds:  apixaban  5 mg Oral BID   budesonide (PULMICORT) nebulizer solution  0.25 mg Nebulization BID   clopidogrel  75 mg Oral Daily   insulin aspart  0-15 Units Subcutaneous TID WC   insulin aspart  0-5 Units Subcutaneous QHS   ipratropium-albuterol  3 mL Nebulization BID   metoprolol tartrate  12.5 mg Oral BID   potassium chloride  10 mEq Oral BID   rosuvastatin  10 mg Oral Daily   tamsulosin  0.4 mg Oral QHS   torsemide  20 mg Oral Daily   Continuous Infusions:  sodium chloride     PRN Meds: acetaminophen **OR** acetaminophen, albuterol, ondansetron **OR** ondansetron (ZOFRAN) IV   Vital Signs    Vitals:   09/30/21 1939 09/30/21 2009 10/01/21 0500 10/01/21 0505  BP:  128/62  125/69  Pulse:  89  94  Resp:  16  16  Temp:  97.7 F (36.5 C)  98.1 F (36.7 C)  TempSrc:  Oral  Oral  SpO2: 97% 98%  100%  Weight:   64.5 kg   Height:        Intake/Output Summary (Last 24 hours) at 10/01/2021 0828 Last data filed at 10/01/2021 0300 Gross per 24 hour  Intake --  Output 750 ml  Net -750 ml    Last 3 Weights 10/01/2021 09/30/2021 09/29/2021  Weight  (lbs) 142 lb 3.2 oz 141 lb 5 oz 137 lb 5.6 oz  Weight (kg) 64.5 kg 64.1 kg 62.3 kg      Telemetry    Atrial fib with controlled V response - Personally Reviewed  ECG     - Personally Reviewed  Physical Exam   Physical Exam: Blood pressure 125/69, pulse 94, temperature 98.1 F (36.7 C), temperature source Oral, resp. rate 16, height 5\' 8"  (1.727 m), weight 64.5 kg, SpO2 100 %.  GEN:  elderly man, very pleasant, nad  HEENT: Normal NECK: No JVD; No carotid bruits LYMPHATICS: No lymphadenopathy CARDIAC: irreg. Irreg.  RESPIRATORY:  Clear to auscultation without rales, wheezing or rhonchi  ABDOMEN: Soft, non-tender, non-distended MUSCULOSKELETAL:  No edema; No deformity  SKIN: Warm and dry NEUROLOGIC:  Alert and oriented x 3   Labs    High Sensitivity Troponin:   Recent Labs  Lab 09/27/21 1411 09/27/21 1617 09/28/21 1453  TROPONINIHS 13 16 28*      Chemistry Recent Labs  Lab 09/28/21 0400 09/28/21 0429 09/29/21 0729 09/30/21 0340 10/01/21 0338  NA 137  --  137 136 136  K 3.3*   < > 3.5 4.3 3.7  CL 97*  --  95* 97* 97*  CO2 28  --  31 30 31   GLUCOSE 114*  --  145* 133* 124*  BUN 22  --  25* 29* 29*  CREATININE 1.27*  --  1.43* 1.47* 1.02  CALCIUM 9.0  --  9.4 9.2 9.2  MG 1.9  --  2.4  --   --   PROT  --   --   --   --  6.8  ALBUMIN  --   --   --   --  3.9  AST  --   --   --   --  22  ALT  --   --   --   --  16  ALKPHOS  --   --   --   --  66  BILITOT  --   --   --   --  1.0  GFRNONAA 54*  --  47* 46* >60  ANIONGAP 12  --  11 9 8    < > = values in this interval not displayed.     Lipids No results for input(s): CHOL, TRIG, HDL, LABVLDL, LDLCALC, CHOLHDL in the last 168 hours.  Hematology Recent Labs  Lab 09/27/21 1411 09/28/21 0400  WBC 6.5 6.3  RBC 3.39* 3.48*  HGB 10.2* 10.4*  HCT 32.8* 33.0*  MCV 96.8 94.8  MCH 30.1 29.9  MCHC 31.1 31.5  RDW 16.1* 16.1*  PLT 149* 151    Thyroid  Recent Labs  Lab 09/28/21 1453  TSH 1.377      BNP Recent Labs  Lab 09/27/21 1417  BNP 464.8*     DDimer No results for input(s): DDIMER in the last 168 hours.   Radiology    No results found.  Cardiac Studies     Patient Profile     87 y.o. male   Assessment & Plan    1.  Acute on chronic combined systolic and diastolic congestive heart failure: He is doing well.  He is diuresed over 6.5 L.  Restarting torsemide today.  Continue potassium chloride at 10 mEq twice a day.  We discussed the strategy that if he becomes volume overloaded that he should double his torsemide for couple of days and also double his potassium supplement for a couple of days.  I have instructed him to call the office for instructions if needed. We will have him see me or my APP in 4 to 6 weeks.  He will need a basic metabolic profile at that time.   Echocardiogram from October 17 reveals mildly depressed left ventricular systolic function with EF of 45 to 50%.  Diastolic parameters were not able to be determined.  He has moderate pulmonary hypertension with an estimated PA pressure of 54.  2.  Atrial fibrillation: Remains in atrial fibrillation.  Continue Eliquis.  3.  Coronary artery disease: He denies any angina.  He seems to be stable.   For questions or updates, please contact [place] Please consult [email or web address] for contact info under        Signed, [name], MD  10/01/2021, 8:28 AM

## 2021-10-01 NOTE — Care Management Important Message (Signed)
Important Message  Patient Details IM Letter placed in the Patient's room. Name: Todd Armstrong MRN: [id number] Date of Birth: [birth year]   Medicare Important Message Given:  Yes     Kerin Salen 10/01/2021, 11:46 AM

## 2021-10-01 NOTE — Progress Notes (Signed)
Pt to be discharged to home today. All discharge instructions including all Discharge Medications and schedules for these Medications reviewed with the Patient. Pt verbalized understanding of all discharge teaching/instructions. VSS and no acute changes noted at time of discharge. Discharge AVS with Pt time of discharge

## 2021-10-06 ENCOUNTER — Other Ambulatory Visit: Payer: Self-pay | Admitting: Cardiovascular Disease

## 2021-10-06 DIAGNOSIS — I5042 Chronic combined systolic (congestive) and diastolic (congestive) heart failure: Secondary | ICD-10-CM | POA: Diagnosis not present

## 2021-10-06 DIAGNOSIS — I482 Chronic atrial fibrillation, unspecified: Secondary | ICD-10-CM | POA: Diagnosis not present

## 2021-10-06 DIAGNOSIS — I4819 Other persistent atrial fibrillation: Secondary | ICD-10-CM

## 2021-10-06 NOTE — Telephone Encounter (Signed)
Eliquis 5mg  refill request received. Patient is 87 years old, weight-64.5kg, Crea-1.02 on 10/01/2021, Diagnosis-Afib and last seen by Dr. Acie Fredrickson on 06/03/2021. Dose is appropriate based on dosing criteria. Will send in refill to requested pharmacy.

## 2021-10-08 ENCOUNTER — Ambulatory Visit: Payer: Self-pay | Admitting: *Deleted

## 2021-10-08 DIAGNOSIS — L603 Nail dystrophy: Secondary | ICD-10-CM | POA: Diagnosis not present

## 2021-10-08 DIAGNOSIS — L84 Corns and callosities: Secondary | ICD-10-CM | POA: Diagnosis not present

## 2021-10-08 NOTE — Telephone Encounter (Signed)
Patient 's daughter called in with patient and asked what is a normal blood sugar for a patient with diabetes? NT asked patient's daughter , on DPR, if patient checks blood glucose in am before eating and daughter said yes. A fasting blood sugar level of 99-129 or lower but above 70 is normal. Patient' s daughter verbalized understanding .

## 2021-10-08 NOTE — Telephone Encounter (Signed)
Reason for Disposition  General information question, no triage required and triager able to answer question  Answer Assessment - Initial Assessment Questions 1. REASON FOR CALL or QUESTION: "What is your reason for calling today?" or "How can I best help you?" or "What question do you have that I can help answer?"     What is a good blood sugar reading for patient?  Protocols used: Information Only Call - No Triage-A-AH

## 2021-10-15 ENCOUNTER — Other Ambulatory Visit: Payer: Self-pay

## 2021-10-15 ENCOUNTER — Other Ambulatory Visit: Payer: Medicare Other

## 2021-10-15 DIAGNOSIS — I5032 Chronic diastolic (congestive) heart failure: Secondary | ICD-10-CM

## 2021-10-15 LAB — BASIC METABOLIC PANEL
BUN/Creatinine Ratio: 18 (ref 10–24)
BUN: 24 mg/dL (ref 8–27)
CO2: 27 mmol/L (ref 20–29)
Calcium: 9.6 mg/dL (ref 8.6–10.2)
Chloride: 97 mmol/L (ref 96–106)
Creatinine, Ser: 1.33 mg/dL — ABNORMAL HIGH (ref 0.76–1.27)
Glucose: 111 mg/dL — ABNORMAL HIGH (ref 70–99)
Potassium: 4.3 mmol/L (ref 3.5–5.2)
Sodium: 138 mmol/L (ref 134–144)
eGFR: 51 mL/min/{1.73_m2} — ABNORMAL LOW (ref >59)

## 2021-10-26 ENCOUNTER — Other Ambulatory Visit: Payer: Self-pay

## 2021-10-26 ENCOUNTER — Encounter: Payer: Medicare Other | Attending: Internal Medicine

## 2021-10-26 DIAGNOSIS — J439 Emphysema, unspecified: Secondary | ICD-10-CM

## 2021-10-26 DIAGNOSIS — G9339 Other post infection and related fatigue syndromes: Secondary | ICD-10-CM | POA: Insufficient documentation

## 2021-10-26 DIAGNOSIS — U099 Post covid-19 condition, unspecified: Secondary | ICD-10-CM | POA: Insufficient documentation

## 2021-10-26 DIAGNOSIS — R5383 Other fatigue: Secondary | ICD-10-CM

## 2021-10-26 NOTE — Progress Notes (Signed)
Virtual Visit completed. Patient informed on EP and RD appointment and 6 Minute walk test. Patient also informed of patient health questionnaires on My Chart. Patient Verbalizes understanding. Visit diagnosis can be found in [facility] Media 08/20/2021.

## 2021-10-27 VITALS — Ht 66.5 in | Wt 136.6 lb

## 2021-10-27 DIAGNOSIS — R5383 Other fatigue: Secondary | ICD-10-CM

## 2021-10-27 DIAGNOSIS — J439 Emphysema, unspecified: Secondary | ICD-10-CM

## 2021-10-27 DIAGNOSIS — U099 Post covid-19 condition, unspecified: Secondary | ICD-10-CM | POA: Diagnosis not present

## 2021-10-27 DIAGNOSIS — G9339 Other post infection and related fatigue syndromes: Secondary | ICD-10-CM | POA: Diagnosis not present

## 2021-10-27 NOTE — Progress Notes (Signed)
Pulmonary Individual Treatment Plan  Patient Details  Name: Todd Armstrong MRN: [id number] Date of Birth: 1931/07/14 Referring Provider:   Flowsheet Row Pulmonary Rehab from 10/27/2021 in [facility] Cardiac and Pulmonary Rehab  Referring Provider Lavone Orn MD       Initial Encounter Date:  Flowsheet Row Pulmonary Rehab from 10/27/2021 in [facility] Cardiac and Pulmonary Rehab  Date 10/27/21       Visit Diagnosis: Pulmonary emphysema, unspecified emphysema type ([place])  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  Patient's Home Medications on Admission:  Current Outpatient Medications:    Accu-Chek FastClix Lancets MISC, For use when checking blood sugars, Disp: , Rfl:    albuterol (VENTOLIN HFA) 108 (90 Base) MCG/ACT inhaler, Inhale 2 puffs into the lungs every 6 (six) hours as needed for wheezing or shortness of breath., Disp: 8 g, Rfl: 6   apixaban (ELIQUIS) 5 MG TABS tablet, TAKE 1 TABLET BY MOUTH  TWICE DAILY, Disp: 180 tablet, Rfl: 1   clopidogrel (PLAVIX) 75 MG tablet, TAKE 1 TABLET BY MOUTH  DAILY (Patient taking differently: Take 75 mg by mouth in the morning.), Disp: 90 tablet, Rfl: 2   Cyanocobalamin (VITAMIN B12) 1000 MCG TBCR, 1 tablet (Patient not taking: Reported on 10/26/2021), Disp: , Rfl:    diltiazem (CARDIZEM CD) 120 MG 24 hr capsule, , Disp: , Rfl:    metFORMIN (GLUCOPHAGE) 1000 MG tablet, Take 1,000 mg by mouth 2 (two) times daily with a meal., Disp: , Rfl:    metoprolol tartrate (LOPRESSOR) 25 MG tablet, Take 0.5 tablets (12.5 mg total) by mouth 2 (two) times daily., Disp: 30 tablet, Rfl: 0   nitroGLYCERIN (NITROSTAT) 0.4 MG SL tablet, Place 1 tablet (0.4 mg total) under the tongue every 5 (five) minutes as needed for chest pain., Disp: 25 tablet, Rfl: 6   potassium chloride (KLOR-CON) 10 MEQ tablet, Take 1 tablet (10 mEq total) by mouth 2 (two) times daily., Disp: 60 tablet, Rfl: 0   rosuvastatin (CRESTOR) 10 MG tablet, TAKE 1 TABLET BY MOUTH  DAILY (Patient taking  differently: Take 10 mg by mouth daily.), Disp: 90 tablet, Rfl: 3   tamsulosin (FLOMAX) 0.4 MG CAPS capsule, Take 0.4 mg by mouth at bedtime. , Disp: , Rfl:    Tiotropium Bromide Monohydrate (SPIRIVA RESPIMAT) 2.5 MCG/ACT AERS, Inhale 2 puffs into the lungs daily., Disp: 4 g, Rfl: 5   torsemide (DEMADEX) 20 MG tablet, Take 1 tablet (20 mg total) by mouth daily., Disp: 30 tablet, Rfl: 0   triamcinolone cream (KENALOG) 0.1 %, Apply 1 application topically as needed (for itching)., Disp: , Rfl:   Past Medical History: Past Medical History:  Diagnosis Date   Acute myocardial infarction    Carotid stenosis    Coronary artery disease    PTCA and stenting of his right coronary artery. 3.5 x 16-mm Liberte stent.               Diabetes mellitus without complication ([place])    Dyslipidemia    Hyperlipemia    Hypertension    Partial tear of rotator cuff    right shoulder   Peripheral vascular disease (HCC)    Pulmonary hypertension (HCC)    moderate pulmonary HTN by 2007 echo (RVSP 52 mmHg)   Stroke [facility])    Wears dentures     Tobacco Use: Social History   Tobacco Use  Smoking Status Former   Packs/day: 1.00   Years: 8.00   Pack years: 8.00   Types: Cigarettes   Quit  date: 09/12/1968   Years since quitting: 53.1  Smokeless Tobacco Never  Tobacco Comments   quit smoking cigarettes > 40 years ago    Labs: Recent Review Flowsheet Data     Labs for ITP Cardiac and Pulmonary Rehab Latest Ref Rng & Units 08/16/2018 02/19/2019 04/22/2020 06/03/2021 09/28/2021   Cholestrol 100 - 199 mg/dL 125 132 148 131 -   LDLCALC 0 - 99 mg/dL 38 37 52 38 -   HDL >39 mg/dL 66 76 75 79 -   Trlycerides 0 - 149 mg/dL 103 96 119 72 -   Hemoglobin A1c 4.8 - 5.6 % - - - - 6.3(H)        Pulmonary Assessment Scores:  Pulmonary Assessment Scores     Row Name 10/27/21 1156         ADL UCSD   ADL Phase Entry     SOB Score total 46     Rest 0     Walk 4     Stairs 5     Bath 0     Dress 1     Shop 5        CAT Score   CAT Score 8       mMRC Score   mMRC Score 2              UCSD: Self-administered rating of dyspnea associated with activities of daily living (ADLs) 6-point scale (0 = "not at all" to 5 = "maximal or unable to do because of breathlessness")  Scoring Scores range from 0 to 120.  Minimally important difference is 5 units  CAT: CAT can identify the health impairment of COPD patients and is better correlated with disease progression.  CAT has a scoring range of zero to 40. The CAT score is classified into four groups of low (less than 10), medium (10 - 20), high (21-30) and very high (31-40) based on the impact level of disease on health status. A CAT score over 10 suggests significant symptoms.  A worsening CAT score could be explained by an exacerbation, poor medication adherence, poor inhaler technique, or progression of COPD or comorbid conditions.  CAT MCID is 2 points  mMRC: mMRC (Modified Medical Research Council) Dyspnea Scale is used to assess the degree of baseline functional disability in patients of respiratory disease due to dyspnea. No minimal important difference is established. A decrease in score of 1 point or greater is considered a positive change.   Pulmonary Function Assessment:  Pulmonary Function Assessment - 10/26/21 1545       Breath   Shortness of Breath Yes;Limiting activity             Exercise Target Goals: Exercise Program Goal: Individual exercise prescription set using results from initial 6 min walk test and THRR while considering  patient's activity barriers and safety.   Exercise Prescription Goal: Initial exercise prescription builds to 30-45 minutes a day of aerobic activity, 2-3 days per week.  Home exercise guidelines will be given to patient during program as part of exercise prescription that the participant will acknowledge.  Education: Aerobic Exercise: - Group verbal and visual presentation on the components of  exercise prescription. Introduces F.I.T.T principle from ACSM for exercise prescriptions.  Reviews F.I.T.T. principles of aerobic exercise including progression. Written material given at graduation.   Education: Resistance Exercise: - Group verbal and visual presentation on the components of exercise prescription. Introduces F.I.T.T principle from ACSM for exercise prescriptions  Reviews F.I.T.T. principles  of resistance exercise including progression. Written material given at graduation.    Education: Exercise & Equipment Safety: - Individual verbal instruction and demonstration of equipment use and safety with use of the equipment. Flowsheet Row Pulmonary Rehab from 10/26/2021 in [facility] Cardiac and Pulmonary Rehab  Date 10/26/21  Educator [facility]  Instruction Review Code 1- Verbalizes Understanding       Education: Exercise Physiology & General Exercise Guidelines: - Group verbal and written instruction with models to review the exercise physiology of the cardiovascular system and associated critical values. Provides general exercise guidelines with specific guidelines to those with heart or lung disease.  Flowsheet Row Pulmonary Rehab from 10/27/2021 in [facility] Cardiac and Pulmonary Rehab  Education need identified 10/27/21       Education: Flexibility, Balance, Mind/Body Relaxation: - Group verbal and visual presentation with interactive activity on the components of exercise prescription. Introduces F.I.T.T principle from ACSM for exercise prescriptions. Reviews F.I.T.T. principles of flexibility and balance exercise training including progression. Also discusses the mind body connection.  Reviews various relaxation techniques to help reduce and manage stress (i.e. Deep breathing, progressive muscle relaxation, and visualization). Balance handout provided to take home. Written material given at graduation.   Activity Barriers & Risk Stratification:  Activity Barriers & Cardiac Risk  Stratification - 10/27/21 1454       Activity Barriers & Cardiac Risk Stratification   Activity Barriers Assistive Device;Shortness of Breath;Deconditioning;Muscular Weakness;Other (comment)    Comments Rod in right leg             6 Minute Walk:  6 Minute Walk     Row Name 10/27/21 1455         6 Minute Walk   Phase Initial     Distance 955 feet     Walk Time 6 minutes     MPH 1.8     METS 1.82     RPE 13     Perceived Dyspnea  0     VO2 Peak 6.4     Symptoms No     Resting HR 87 bpm     Resting BP 138/64     Resting Oxygen Saturation  98 %     Exercise Oxygen Saturation  during 6 min walk 97 %     Max Ex. HR 109 bpm     Max Ex. BP 144/66     2 Minute Post BP 122/64       Interval HR   1 Minute HR 96     2 Minute HR 98     3 Minute HR 109     4 Minute HR 106     5 Minute HR 99     6 Minute HR 103     2 Minute Post HR 89     Interval Heart Rate? Yes       Interval Oxygen   Interval Oxygen? Yes     Baseline Oxygen Saturation % 98 %     1 Minute Oxygen Saturation % 96 %     1 Minute Liters of Oxygen 0 L  RA     2 Minute Oxygen Saturation % 98 %     2 Minute Liters of Oxygen 0 L     3 Minute Oxygen Saturation % 98 %     3 Minute Liters of Oxygen 0 L     4 Minute Oxygen Saturation % 98 %     4 Minute Liters of Oxygen 0 L  5 Minute Oxygen Saturation % 98 %     5 Minute Liters of Oxygen 0 L     6 Minute Oxygen Saturation % 98 %     6 Minute Liters of Oxygen 0 L     2 Minute Post Oxygen Saturation % 97 %     2 Minute Post Liters of Oxygen 0 L             Oxygen Initial Assessment:  Oxygen Initial Assessment - 10/27/21 1156       Home Oxygen   Home Oxygen Device None    Sleep Oxygen Prescription None    Home Exercise Oxygen Prescription None    Home Resting Oxygen Prescription None    Compliance with Home Oxygen Use Yes      Initial 6 min Walk   Oxygen Used None      Program Oxygen Prescription   Program Oxygen Prescription None       Intervention   Short Term Goals To learn and exhibit compliance with exercise, home and travel O2 prescription;To learn and understand importance of monitoring SPO2 with pulse oximeter and demonstrate accurate use of the pulse oximeter.;To learn and demonstrate proper pursed lip breathing techniques or other breathing techniques. ;To learn and understand importance of maintaining oxygen saturations>88%;To learn and demonstrate proper use of respiratory medications    Long  Term Goals Exhibits compliance with exercise, home  and travel O2 prescription;Verbalizes importance of monitoring SPO2 with pulse oximeter and return demonstration;Maintenance of O2 saturations>88%;Exhibits proper breathing techniques, such as pursed lip breathing or other method taught during program session;Compliance with respiratory medication;Demonstrates proper use of MDI's             Oxygen Re-Evaluation:   Oxygen Discharge (Final Oxygen Re-Evaluation):   Initial Exercise Prescription:  Initial Exercise Prescription - 10/27/21 1400       Date of Initial Exercise RX and Referring Provider   Date 10/27/21    Referring Provider Lavone Orn MD      Oxygen   Maintain Oxygen Saturation 88% or higher      Recumbant Bike   Level 1    RPM 60    Watts 20    Minutes 15    METs 1.8      NuStep   Level 1    SPM 80    Minutes 15    METs 1.8      Track   Laps 14    Minutes 15    METs 1.76      Prescription Details   Frequency (times per week) 2    Duration Progress to 30 minutes of continuous aerobic without signs/symptoms of physical distress      Intensity   THRR 40-80% of Max Heartrate 105-123    Ratings of Perceived Exertion 11-13    Perceived Dyspnea 0-4      Progression   Progression Continue to progress workloads to maintain intensity without signs/symptoms of physical distress.      Resistance Training   Training Prescription Yes    Weight 3 lb    Reps 10-15              Perform Capillary Blood Glucose checks as needed.  Exercise Prescription Changes:   Exercise Prescription Changes     Row Name 10/27/21 1400             Response to Exercise   Blood Pressure (Admit) 138/64       Blood Pressure (  Exercise) 144/66       Blood Pressure (Exit) 122/64       Heart Rate (Admit) 87 bpm       Heart Rate (Exercise) 109 bpm       Heart Rate (Exit) 89 bpm       Oxygen Saturation (Admit) 98 %       Oxygen Saturation (Exercise) 97 %       Oxygen Saturation (Exit) 97 %       Rating of Perceived Exertion (Exercise) 13       Perceived Dyspnea (Exercise) 0       Symptoms none       Comments walk test results                Exercise Comments:   Exercise Goals and Review:   Exercise Goals     Row Name 10/27/21 1501             Exercise Goals   Increase Physical Activity Yes       Intervention Provide advice, education, support and counseling about physical activity/exercise needs.;Develop an individualized exercise prescription for aerobic and resistive training based on initial evaluation findings, risk stratification, comorbidities and participant's personal goals.       Expected Outcomes Short Term: Attend rehab on a regular basis to increase amount of physical activity.;Long Term: Add in home exercise to make exercise part of routine and to increase amount of physical activity.;Long Term: Exercising regularly at least 3-5 days a week.       Increase Strength and Stamina Yes       Intervention Provide advice, education, support and counseling about physical activity/exercise needs.;Develop an individualized exercise prescription for aerobic and resistive training based on initial evaluation findings, risk stratification, comorbidities and participant's personal goals.       Expected Outcomes Short Term: Increase workloads from initial exercise prescription for resistance, speed, and METs.;Short Term: Perform resistance training exercises  routinely during rehab and add in resistance training at home;Long Term: Improve cardiorespiratory fitness, muscular endurance and strength as measured by increased METs and functional capacity (6MWT)       Able to understand and use rate of perceived exertion (RPE) scale Yes       Intervention Provide education and explanation on how to use RPE scale       Expected Outcomes Short Term: Able to use RPE daily in rehab to express subjective intensity level;Long Term:  Able to use RPE to guide intensity level when exercising independently       Able to understand and use Dyspnea scale Yes       Intervention Provide education and explanation on how to use Dyspnea scale       Expected Outcomes Short Term: Able to use Dyspnea scale daily in rehab to express subjective sense of shortness of breath during exertion;Long Term: Able to use Dyspnea scale to guide intensity level when exercising independently       Knowledge and understanding of Target Heart Rate Range (THRR) Yes       Intervention Provide education and explanation of THRR including how the numbers were predicted and where they are located for reference       Expected Outcomes Short Term: Able to state/look up THRR;Long Term: Able to use THRR to govern intensity when exercising independently;Short Term: Able to use daily as guideline for intensity in rehab       Able to check pulse independently Yes  Intervention Provide education and demonstration on how to check pulse in carotid and radial arteries.;Review the importance of being able to check your own pulse for safety during independent exercise       Expected Outcomes Short Term: Able to explain why pulse checking is important during independent exercise;Long Term: Able to check pulse independently and accurately       Understanding of Exercise Prescription Yes       Intervention Provide education, explanation, and written materials on patient's individual exercise prescription        Expected Outcomes Short Term: Able to explain program exercise prescription;Long Term: Able to explain home exercise prescription to exercise independently                Exercise Goals Re-Evaluation :   Discharge Exercise Prescription (Final Exercise Prescription Changes):  Exercise Prescription Changes - 10/27/21 1400       Response to Exercise   Blood Pressure (Admit) 138/64    Blood Pressure (Exercise) 144/66    Blood Pressure (Exit) 122/64    Heart Rate (Admit) 87 bpm    Heart Rate (Exercise) 109 bpm    Heart Rate (Exit) 89 bpm    Oxygen Saturation (Admit) 98 %    Oxygen Saturation (Exercise) 97 %    Oxygen Saturation (Exit) 97 %    Rating of Perceived Exertion (Exercise) 13    Perceived Dyspnea (Exercise) 0    Symptoms none    Comments walk test results             Nutrition:  Target Goals: Understanding of nutrition guidelines, daily intake of sodium 1500mg , cholesterol 200mg , calories 30% from fat and 7% or less from saturated fats, daily to have 5 or more servings of fruits and vegetables.  Education: All About Nutrition: -Group instruction provided by verbal, written material, interactive activities, discussions, models, and posters to present general guidelines for heart healthy nutrition including fat, fiber, MyPlate, the role of sodium in heart healthy nutrition, utilization of the nutrition label, and utilization of this knowledge for meal planning. Follow up email sent as well. Written material given at graduation.   Biometrics:  Pre Biometrics - 10/27/21 1454       Pre Biometrics   Height 5' 6.5" (1.689 m)    Weight 136 lb 9.6 oz (62 kg)    BMI (Calculated) 21.72    Single Leg Stand 4.5 seconds              Nutrition Therapy Plan and Nutrition Goals:  Nutrition Therapy & Goals - 10/27/21 1450       Intervention Plan   Intervention Prescribe, educate and counsel regarding individualized specific dietary modifications aiming towards  targeted core components such as weight, hypertension, lipid management, diabetes, heart failure and other comorbidities.    Expected Outcomes Short Term Goal: Understand basic principles of dietary content, such as calories, fat, sodium, cholesterol and nutrients.;Short Term Goal: A plan has been developed with personal nutrition goals set during dietitian appointment.;Long Term Goal: Adherence to prescribed nutrition plan.             Nutrition Assessments:  MEDIFICTS Score Key: ?70 Need to make dietary changes  40-70 Heart Healthy Diet ? 40 Therapeutic Level Cholesterol Diet  Flowsheet Row Pulmonary Rehab from 10/27/2021 in [facility] Cardiac and Pulmonary Rehab  Picture Your Plate Total Score on Admission 63      Picture Your Plate Scores: <79 Unhealthy dietary pattern with much room for improvement. 41-50 Dietary  pattern unlikely to meet recommendations for good health and room for improvement. 51-60 More healthful dietary pattern, with some room for improvement.  >60 Healthy dietary pattern, although there may be some specific behaviors that could be improved.   Nutrition Goals Re-Evaluation:   Nutrition Goals Discharge (Final Nutrition Goals Re-Evaluation):   Psychosocial: Target Goals: Acknowledge presence or absence of significant depression and/or stress, maximize coping skills, provide positive support system. Participant is able to verbalize types and ability to use techniques and skills needed for reducing stress and depression.   Education: Stress, Anxiety, and Depression - Group verbal and visual presentation to define topics covered.  Reviews how body is impacted by stress, anxiety, and depression.  Also discusses healthy ways to reduce stress and to treat/manage anxiety and depression.  Written material given at graduation.   Education: Sleep Hygiene -Provides group verbal and written instruction about how sleep can affect your health.  Define sleep hygiene,  discuss sleep cycles and impact of sleep habits. Review good sleep hygiene tips.    Initial Review & Psychosocial Screening:  Initial Psych Review & Screening - 10/26/21 [place]? Yes    Comments He can look to his neighbors, family and daughter for support. He has a positive outlook on his health.      Barriers   Psychosocial barriers to participate in program There are no identifiable barriers or psychosocial needs.;The patient should benefit from training in stress management and relaxation.      Screening Interventions   Interventions To provide support and resources with identified psychosocial needs;Encouraged to exercise;Provide feedback about the scores to participant    Expected Outcomes Short Term goal: Utilizing psychosocial counselor, staff and physician to assist with identification of specific Stressors or current issues interfering with healing process. Setting desired goal for each stressor or current issue identified.;Long Term Goal: Stressors or current issues are controlled or eliminated.;Short Term goal: Identification and review with participant of any Quality of Life or Depression concerns found by scoring the questionnaire.;Long Term goal: The participant improves quality of Life and PHQ9 Scores as seen by post scores and/or verbalization of changes             Quality of Life Scores:  Scores of 19 and below usually indicate a poorer quality of life in these areas.  A difference of  2-3 points is a clinically meaningful difference.  A difference of 2-3 points in the total score of the Quality of Life Index has been associated with significant improvement in overall quality of life, self-image, physical symptoms, and general health in studies assessing change in quality of life.  PHQ-9: Recent Review Flowsheet Data     Depression screen [facility] 2/9 10/27/2021   Decreased Interest 0   Down, Depressed, Hopeless 0   PHQ - 2  Score 0   Altered sleeping 3   Tired, decreased energy 1   Change in appetite 3   Feeling bad or failure about yourself  0   Trouble concentrating 0   Moving slowly or fidgety/restless 0   Suicidal thoughts 0   PHQ-9 Score 7   Difficult doing work/chores Not difficult at all      Interpretation of Total Score  Total Score Depression Severity:  1-4 = Minimal depression, 5-9 = Mild depression, 10-14 = Moderate depression, 15-19 = Moderately severe depression, 20-27 = Severe depression   Psychosocial Evaluation and Intervention:  Psychosocial Evaluation -  10/26/21 1548       Psychosocial Evaluation & Interventions   Interventions Encouraged to exercise with the program and follow exercise prescription;Relaxation education;Stress management education    Comments He can look to his neighbors, family and daughter for support. He has a positive outlook on his health.    Expected Outcomes Short: Start [facility] to help with mood. Long: Maintain a healthy mental state.    Continue Psychosocial Services  Follow up required by staff             Psychosocial Re-Evaluation:   Psychosocial Discharge (Final Psychosocial Re-Evaluation):   Education: Education Goals: Education classes will be provided on a weekly basis, covering required topics. Participant will state understanding/return demonstration of topics presented.  Learning Barriers/Preferences:  Learning Barriers/Preferences - 10/26/21 1545       Learning Barriers/Preferences   Learning Barriers None    Learning Preferences None             General Pulmonary Education Topics:  Infection Prevention: - Provides verbal and written material to individual with discussion of infection control including proper hand washing and proper equipment cleaning during exercise session. Flowsheet Row Pulmonary Rehab from 10/26/2021 in [facility] Cardiac and Pulmonary Rehab  Date 10/26/21  Educator [facility]  Instruction Review Code 1-  Verbalizes Understanding       Falls Prevention: - Provides verbal and written material to individual with discussion of falls prevention and safety. Flowsheet Row Pulmonary Rehab from 10/26/2021 in [facility] Cardiac and Pulmonary Rehab  Date 10/26/21  Educator [facility]  Instruction Review Code 1- Verbalizes Understanding       Chronic Lung Disease Review: - Group verbal instruction with posters, models, PowerPoint presentations and videos,  to review new updates, new respiratory medications, new advancements in procedures and treatments. Providing information on websites and "800" numbers for continued self-education. Includes information about supplement oxygen, available portable oxygen systems, continuous and intermittent flow rates, oxygen safety, concentrators, and Medicare reimbursement for oxygen. Explanation of Pulmonary Drugs, including class, frequency, complications, importance of spacers, rinsing mouth after steroid MDI's, and proper cleaning methods for nebulizers. Review of basic lung anatomy and physiology related to function, structure, and complications of lung disease. Review of risk factors. Discussion about methods for diagnosing sleep apnea and types of masks and machines for OSA. Includes a review of the use of types of environmental controls: home humidity, furnaces, filters, dust mite/pet prevention, HEPA vacuums. Discussion about weather changes, air quality and the benefits of nasal washing. Instruction on Warning signs, infection symptoms, calling MD promptly, preventive modes, and value of vaccinations. Review of effective airway clearance, coughing and/or vibration techniques. Emphasizing that all should Create an Action Plan. Written material given at graduation. Flowsheet Row Pulmonary Rehab from 10/27/2021 in [facility] Re[facility]  Education need identified 10/27/21       AED/CPR: - Group verbal and written instruction with the use of models to demonstrate the  basic use of the AED with the basic ABC's of resuscitation.    Anatomy and Cardiac Procedures: - Group verbal and visual presentation and models provide information about basic cardiac anatomy and function. Reviews the testing methods done to diagnose heart disease and the outcomes of the test results. Describes the treatment choices: Medical Management, Angioplasty, or Coronary Bypass Surgery for treating various heart conditions including Myocardial Infarction, Angina, Valve Disease, and Cardiac Arrhythmias.  Written material given at graduation.   Medication Safety: - Group verbal and visual instruction to review commonly prescribed medications  for heart and lung disease. Reviews the medication, class of the drug, and side effects. Includes the steps to properly store meds and maintain the prescription regimen.  Written material given at graduation.   Other: -Provides group and verbal instruction on various topics (see comments)   Knowledge Questionnaire Score:  Knowledge Questionnaire Score - 10/27/21 1449       Knowledge Questionnaire Score   Pre Score 12/18: Lung disease, ADL, O2, PLB              Core Components/Risk Factors/Patient Goals at Admission:  Personal Goals and Risk Factors at Admission - 10/27/21 1502       Core Components/Risk Factors/Patient Goals on Admission    Weight Management Yes;Weight Maintenance    Intervention Weight Management: Develop a combined nutrition and exercise program designed to reach desired caloric intake, while maintaining appropriate intake of nutrient and fiber, sodium and fats, and appropriate energy expenditure required for the weight goal.;Weight Management: Provide education and appropriate resources to help participant work on and attain dietary goals.;Weight Management/Obesity: Establish reasonable short term and long term weight goals.    Admit Weight 136 lb (61.7 kg)    Goal Weight: Short Term 136 lb (61.7 kg)    Goal Weight:  Long Term 136 lb (61.7 kg)    Expected Outcomes Short Term: Continue to assess and modify interventions until short term weight is achieved;Long Term: Adherence to nutrition and physical activity/exercise program aimed toward attainment of established weight goal;Understanding recommendations for meals to include 15-35% energy as protein, 25-35% energy from fat, 35-60% energy from carbohydrates, less than 200mg  of dietary cholesterol, 20-35 gm of total fiber daily;Understanding of distribution of calorie intake throughout the day with the consumption of 4-5 meals/snacks;Weight Maintenance: Understanding of the daily nutrition guidelines, which includes 25-35% calories from fat, 7% or less cal from saturated fats, less than 200mg  cholesterol, less than 1.5gm of sodium, & 5 or more servings of fruits and vegetables daily    Improve shortness of breath with ADL's Yes    Intervention Provide education, individualized exercise plan and daily activity instruction to help decrease symptoms of SOB with activities of daily living.    Expected Outcomes Short Term: Improve cardiorespiratory fitness to achieve a reduction of symptoms when performing ADLs;Long Term: Be able to perform more ADLs without symptoms or delay the onset of symptoms    Diabetes Yes    Intervention Provide education about signs/symptoms and action to take for hypo/hyperglycemia.;Provide education about proper nutrition, including hydration, and aerobic/resistive exercise prescription along with prescribed medications to achieve blood glucose in normal ranges: Fasting glucose 65-99 mg/dL    Expected Outcomes Short Term: Participant verbalizes understanding of the signs/symptoms and immediate care of hyper/hypoglycemia, proper foot care and importance of medication, aerobic/resistive exercise and nutrition plan for blood glucose control.;Long Term: Attainment of HbA1C < 7%.    Hypertension Yes    Intervention Provide education on lifestyle  modifcations including regular physical activity/exercise, weight management, moderate sodium restriction and increased consumption of fresh fruit, vegetables, and low fat dairy, alcohol moderation, and smoking cessation.;Monitor prescription use compliance.    Expected Outcomes Short Term: Continued assessment and intervention until BP is < 140/63mm HG in hypertensive participants. < 130/55mm HG in hypertensive participants with diabetes, heart failure or chronic kidney disease.;Long Term: Maintenance of blood pressure at goal levels.    Lipids Yes    Intervention Provide education and support for participant on nutrition & aerobic/resistive exercise along with prescribed medications to  achieve LDL 70mg , HDL >40mg .    Expected Outcomes Short Term: Participant states understanding of desired cholesterol values and is compliant with medications prescribed. Participant is following exercise prescription and nutrition guidelines.;Long Term: Cholesterol controlled with medications as prescribed, with individualized exercise RX and with personalized nutrition plan. Value goals: LDL < 70mg , HDL > 40 mg.             Education:Diabetes - Individual verbal and written instruction to review signs/symptoms of diabetes, desired ranges of glucose level fasting, after meals and with exercise. Acknowledge that pre and post exercise glucose checks will be done for 3 sessions at entry of program. Flowsheet Row Pulmonary Rehab from 10/26/2021 in [facility]  Date 10/26/21  Educator [facility]  Instruction Review Code 1- Verbalizes Understanding       Know Your Numbers and Heart Failure: - Group verbal and visual instruction to discuss disease risk factors for cardiac and pulmonary disease and treatment options.  Reviews associated critical values for Overweight/Obesity, Hypertension, Cholesterol, and Diabetes.  Discusses basics of heart failure: signs/symptoms and treatments.  Introduces Heart  Failure Zone chart for action plan for heart failure.  Written material given at graduation.   Core Components/Risk Factors/Patient Goals Review:    Core Components/Risk Factors/Patient Goals at Discharge (Final Review):    ITP Comments:  ITP Comments     Row Name 10/26/21 1543 10/27/21 1151         ITP Comments Virtual Visit completed. Patient informed on EP and RD appointment and 6 Minute walk test. Patient also informed of patient health questionnaires on My Chart. Patient Verbalizes understanding. Visit diagnosis can be found in [facility] 08/20/2021. Completed 6MWT and gym orientation. Initial ITP created and sent for review to Dr. Ottie Glazier, Medical Director.               Comments: Initial ITP

## 2021-10-27 NOTE — Patient Instructions (Signed)
Patient Instructions  Patient Details  Name: Todd Armstrong MRN: [id number] Date of Birth: Nov 04, 1931 Referring Provider:  Lavone Orn, MD  Below are your personal goals for exercise, nutrition, and risk factors. Our goal is to help you stay on track towards obtaining and maintaining these goals. We will be discussing your progress on these goals with you throughout the program.  Initial Exercise Prescription:  Initial Exercise Prescription - 10/27/21 1400       Date of Initial Exercise RX and Referring Provider   Date 10/27/21    Referring Provider Lavone Orn MD      Oxygen   Maintain Oxygen Saturation 88% or higher      Recumbant Bike   Level 1    RPM 60    Watts 20    Minutes 15    METs 1.8      NuStep   Level 1    SPM 80    Minutes 15    METs 1.8      Track   Laps 14    Minutes 15    METs 1.76      Prescription Details   Frequency (times per week) 2    Duration Progress to 30 minutes of continuous aerobic without signs/symptoms of physical distress      Intensity   THRR 40-80% of Max Heartrate 105-123    Ratings of Perceived Exertion 11-13    Perceived Dyspnea 0-4      Progression   Progression Continue to progress workloads to maintain intensity without signs/symptoms of physical distress.      Resistance Training   Training Prescription Yes    Weight 3 lb    Reps 10-15             Exercise Goals: Frequency: Be able to perform aerobic exercise two to three times per week in program working toward 2-5 days per week of home exercise.  Intensity: Work with a perceived exertion of 11 (fairly light) - 15 (hard) while following your exercise prescription.  We will make changes to your prescription with you as you progress through the program.   Duration: Be able to do 30 to 45 minutes of continuous aerobic exercise in addition to a 5 minute warm-up and a 5 minute cool-down routine.   Nutrition Goals: Your personal nutrition goals will be  established when you do your nutrition analysis with the dietician.  The following are general nutrition guidelines to follow: Cholesterol < 200mg /day Sodium < 1500mg /day Fiber: Men over 50 yrs - 30 grams per day  Personal Goals:  Personal Goals and Risk Factors at Admission - 10/27/21 1502       Core Components/Risk Factors/Patient Goals on Admission    Weight Management Yes;Weight Maintenance    Intervention Weight Management: Develop a combined nutrition and exercise program designed to reach desired caloric intake, while maintaining appropriate intake of nutrient and fiber, sodium and fats, and appropriate energy expenditure required for the weight goal.;Weight Management: Provide education and appropriate resources to help participant work on and attain dietary goals.;Weight Management/Obesity: Establish reasonable short term and long term weight goals.    Admit Weight 136 lb (61.7 kg)    Goal Weight: Short Term 136 lb (61.7 kg)    Goal Weight: Long Term 136 lb (61.7 kg)    Expected Outcomes Short Term: Continue to assess and modify interventions until short term weight is achieved;Long Term: Adherence to nutrition and physical activity/exercise program aimed toward attainment of established weight  goal;Understanding recommendations for meals to include 15-35% energy as protein, 25-35% energy from fat, 35-60% energy from carbohydrates, less than 200mg  of dietary cholesterol, 20-35 gm of total fiber daily;Understanding of distribution of calorie intake throughout the day with the consumption of 4-5 meals/snacks;Weight Maintenance: Understanding of the daily nutrition guidelines, which includes 25-35% calories from fat, 7% or less cal from saturated fats, less than 200mg  cholesterol, less than 1.5gm of sodium, & 5 or more servings of fruits and vegetables daily    Improve shortness of breath with ADL's Yes    Intervention Provide education, individualized exercise plan and daily activity  instruction to help decrease symptoms of SOB with activities of daily living.    Expected Outcomes Short Term: Improve cardiorespiratory fitness to achieve a reduction of symptoms when performing ADLs;Long Term: Be able to perform more ADLs without symptoms or delay the onset of symptoms    Diabetes Yes    Intervention Provide education about signs/symptoms and action to take for hypo/hyperglycemia.;Provide education about proper nutrition, including hydration, and aerobic/resistive exercise prescription along with prescribed medications to achieve blood glucose in normal ranges: Fasting glucose 65-99 mg/dL    Expected Outcomes Short Term: Participant verbalizes understanding of the signs/symptoms and immediate care of hyper/hypoglycemia, proper foot care and importance of medication, aerobic/resistive exercise and nutrition plan for blood glucose control.;Long Term: Attainment of HbA1C < 7%.    Hypertension Yes    Intervention Provide education on lifestyle modifcations including regular physical activity/exercise, weight management, moderate sodium restriction and increased consumption of fresh fruit, vegetables, and low fat dairy, alcohol moderation, and smoking cessation.;Monitor prescription use compliance.    Expected Outcomes Short Term: Continued assessment and intervention until BP is < 140/47mm HG in hypertensive participants. < 130/50mm HG in hypertensive participants with diabetes, heart failure or chronic kidney disease.;Long Term: Maintenance of blood pressure at goal levels.    Lipids Yes    Intervention Provide education and support for participant on nutrition & aerobic/resistive exercise along with prescribed medications to achieve LDL 70mg , HDL >40mg .    Expected Outcomes Short Term: Participant states understanding of desired cholesterol values and is compliant with medications prescribed. Participant is following exercise prescription and nutrition guidelines.;Long Term: Cholesterol  controlled with medications as prescribed, with individualized exercise RX and with personalized nutrition plan. Value goals: LDL < 70mg , HDL > 40 mg.             Tobacco Use Initial Evaluation: Social History   Tobacco Use  Smoking Status Former   Packs/day: 1.00   Years: 8.00   Pack years: 8.00   Types: Cigarettes   Quit date: 09/12/1968   Years since quitting: 53.1  Smokeless Tobacco Never  Tobacco Comments   quit smoking cigarettes > 40 years ago    Exercise Goals and Review:  Exercise Goals     Row Name 10/27/21 1501             Exercise Goals   Increase Physical Activity Yes       Intervention Provide advice, education, support and counseling about physical activity/exercise needs.;Develop an individualized exercise prescription for aerobic and resistive training based on initial evaluation findings, risk stratification, comorbidities and participant's personal goals.       Expected Outcomes Short Term: Attend rehab on a regular basis to increase amount of physical activity.;Long Term: Add in home exercise to make exercise part of routine and to increase amount of physical activity.;Long Term: Exercising regularly at least 3-5 days a week.  Increase Strength and Stamina Yes       Intervention Provide advice, education, support and counseling about physical activity/exercise needs.;Develop an individualized exercise prescription for aerobic and resistive training based on initial evaluation findings, risk stratification, comorbidities and participant's personal goals.       Expected Outcomes Short Term: Increase workloads from initial exercise prescription for resistance, speed, and METs.;Short Term: Perform resistance training exercises routinely during rehab and add in resistance training at home;Long Term: Improve cardiorespiratory fitness, muscular endurance and strength as measured by increased METs and functional capacity (6MWT)       Able to understand and use  rate of perceived exertion (RPE) scale Yes       Intervention Provide education and explanation on how to use RPE scale       Expected Outcomes Short Term: Able to use RPE daily in rehab to express subjective intensity level;Long Term:  Able to use RPE to guide intensity level when exercising independently       Able to understand and use Dyspnea scale Yes       Intervention Provide education and explanation on how to use Dyspnea scale       Expected Outcomes Short Term: Able to use Dyspnea scale daily in rehab to express subjective sense of shortness of breath during exertion;Long Term: Able to use Dyspnea scale to guide intensity level when exercising independently       Knowledge and understanding of Target Heart Rate Range (THRR) Yes       Intervention Provide education and explanation of THRR including how the numbers were predicted and where they are located for reference       Expected Outcomes Short Term: Able to state/look up THRR;Long Term: Able to use THRR to govern intensity when exercising independently;Short Term: Able to use daily as guideline for intensity in rehab       Able to check pulse independently Yes       Intervention Provide education and demonstration on how to check pulse in carotid and radial arteries.;Review the importance of being able to check your own pulse for safety during independent exercise       Expected Outcomes Short Term: Able to explain why pulse checking is important during independent exercise;Long Term: Able to check pulse independently and accurately       Understanding of Exercise Prescription Yes       Intervention Provide education, explanation, and written materials on patient's individual exercise prescription       Expected Outcomes Short Term: Able to explain program exercise prescription;Long Term: Able to explain home exercise prescription to exercise independently                Copy of goals given to participant.

## 2021-10-28 DIAGNOSIS — C44319 Basal cell carcinoma of skin of other parts of face: Secondary | ICD-10-CM | POA: Diagnosis not present

## 2021-10-29 ENCOUNTER — Other Ambulatory Visit: Payer: Self-pay

## 2021-10-29 DIAGNOSIS — U099 Post covid-19 condition, unspecified: Secondary | ICD-10-CM | POA: Diagnosis not present

## 2021-10-29 DIAGNOSIS — J439 Emphysema, unspecified: Secondary | ICD-10-CM | POA: Diagnosis not present

## 2021-10-29 DIAGNOSIS — G9339 Other post infection and related fatigue syndromes: Secondary | ICD-10-CM | POA: Diagnosis not present

## 2021-10-29 LAB — GLUCOSE, CAPILLARY
Glucose-Capillary: 120 mg/dL — ABNORMAL HIGH (ref 70–99)
Glucose-Capillary: 98 mg/dL (ref 70–99)

## 2021-10-29 NOTE — Progress Notes (Signed)
Daily Session Note  Patient Details  Name: Todd Armstrong MRN: [id number] Date of Birth: 02-03-1931 Referring Provider:   Flowsheet Row Pulmonary Rehab from 10/27/2021 in [facility] Cardiac and Pulmonary Rehab  Referring Provider Lavone Orn MD       Encounter Date: 10/29/2021  Check In:  Session Check In - 10/29/21 0951       Check-In   Supervising physician immediately available to respond to emergencies See telemetry face sheet for immediately available ER MD    Location [facility]-Cardiac & Pulmonary Rehab    Staff Present Birdie Sons, MPA, Mauricia Area, BS, ACSM CEP, Exercise Physiologist;Amanda Oletta Darter, BA, ACSM CEP, Exercise Physiologist;Kara Eliezer Bottom, MS, ASCM CEP, Exercise Physiologist    Virtual Visit No    Medication changes reported     Yes    Comments started antibiotic post eye surgery    Fall or balance concerns reported    No    Warm-up and Cool-down Performed on first and last piece of equipment    Resistance Training Performed Yes    VAD Patient? No    PAD/SET Patient? No      Pain Assessment   Currently in Pain? No/denies                Social History   Tobacco Use  Smoking Status Former   Packs/day: 1.00   Years: 8.00   Pack years: 8.00   Types: Cigarettes   Quit date: 09/12/1968   Years since quitting: 53.1  Smokeless Tobacco Never  Tobacco Comments   quit smoking cigarettes > 40 years ago    Goals Met:  Independence with exercise equipment Exercise tolerated well No report of concerns or symptoms today Strength training completed today  Goals Unmet:  Not Applicable  Comments: First full day of exercise!  Patient was oriented to gym and equipment including functions, settings, policies, and procedures.  Patient's individual exercise prescription and treatment plan were reviewed.  All starting workloads were established based on the results of the 6 minute walk test done at initial orientation visit.  The plan for exercise progression  was also introduced and progression will be customized based on patient's performance and goals.     Dr. Emily Filbert is Medical Director for [place].  Dr. Ottie Glazier is Medical Director for [facility] Pulmonary Rehabilitation.

## 2021-11-02 ENCOUNTER — Other Ambulatory Visit: Payer: Self-pay

## 2021-11-02 ENCOUNTER — Encounter: Payer: Medicare Other | Admitting: *Deleted

## 2021-11-02 DIAGNOSIS — J439 Emphysema, unspecified: Secondary | ICD-10-CM

## 2021-11-02 DIAGNOSIS — U099 Post covid-19 condition, unspecified: Secondary | ICD-10-CM | POA: Diagnosis not present

## 2021-11-02 DIAGNOSIS — G9339 Other post infection and related fatigue syndromes: Secondary | ICD-10-CM | POA: Diagnosis not present

## 2021-11-02 LAB — GLUCOSE, CAPILLARY: Glucose-Capillary: 140 mg/dL — ABNORMAL HIGH (ref 70–99)

## 2021-11-02 NOTE — Progress Notes (Signed)
Daily Session Note  Patient Details  Name: ABRAHAM ENTWISTLE MRN: [id number] Date of Birth: [birth year] Referring Provider:   Flowsheet Row Pulmonary Rehab from 10/27/2021 in [facility] Cardiac and Pulmonary Rehab  Referring Provider Lavone Orn MD       Encounter Date: 11/02/2021  Check In:  Session Check In - 11/02/21 0931       Check-In   Supervising physician immediately available to respond to emergencies See telemetry face sheet for immediately available ER MD    Location [facility]-Cardiac & Pulmonary Rehab    Staff Present Heath Lark, RN, BSN, Laveda Norman, BS, ACSM CEP, Exercise Physiologist;Amanda Oletta Darter, [place], ACSM CEP, Exercise Physiologist    Virtual Visit No    Medication changes reported     No    Fall or balance concerns reported    No    Warm-up and Cool-down Performed on first and last piece of equipment    Resistance Training Performed Yes    VAD Patient? No    PAD/SET Patient? No      Pain Assessment   Currently in Pain? No/denies                Social History   Tobacco Use  Smoking Status Former   Packs/day: 1.00   Years: 8.00   Pack years: 8.00   Types: Cigarettes   Quit date: 09/12/1968   Years since quitting: 53.1  Smokeless Tobacco Never  Tobacco Comments   quit smoking cigarettes > 40 years ago    Goals Met:  Proper associated with RPD/PD & O2 Sat Exercise tolerated well No report of concerns or symptoms today  Goals Unmet:  Not Applicable  Comments: Pt able to follow exercise prescription today without complaint.  Will continue to monitor for progression.    Dr. Emily Filbert is Medical Director for [place].  Dr. Ottie Glazier is Medical Director for [facility] Pulmonary Rehabilitation.

## 2021-11-03 ENCOUNTER — Encounter: Payer: Self-pay | Admitting: Physician Assistant

## 2021-11-03 ENCOUNTER — Other Ambulatory Visit: Payer: Self-pay

## 2021-11-03 ENCOUNTER — Other Ambulatory Visit: Payer: Self-pay | Admitting: Cardiovascular Disease

## 2021-11-03 DIAGNOSIS — J439 Emphysema, unspecified: Secondary | ICD-10-CM | POA: Diagnosis not present

## 2021-11-03 DIAGNOSIS — I10 Essential (primary) hypertension: Secondary | ICD-10-CM | POA: Diagnosis not present

## 2021-11-03 DIAGNOSIS — I252 Old myocardial infarction: Secondary | ICD-10-CM | POA: Diagnosis not present

## 2021-11-03 DIAGNOSIS — E7800 Pure hypercholesterolemia, unspecified: Secondary | ICD-10-CM | POA: Diagnosis not present

## 2021-11-03 DIAGNOSIS — G8929 Other chronic pain: Secondary | ICD-10-CM | POA: Diagnosis not present

## 2021-11-03 DIAGNOSIS — I509 Heart failure, unspecified: Secondary | ICD-10-CM | POA: Diagnosis not present

## 2021-11-03 DIAGNOSIS — I5042 Chronic combined systolic (congestive) and diastolic (congestive) heart failure: Secondary | ICD-10-CM | POA: Diagnosis not present

## 2021-11-03 DIAGNOSIS — U099 Post covid-19 condition, unspecified: Secondary | ICD-10-CM | POA: Diagnosis not present

## 2021-11-03 DIAGNOSIS — G459 Transient cerebral ischemic attack, unspecified: Secondary | ICD-10-CM | POA: Diagnosis not present

## 2021-11-03 DIAGNOSIS — I2510 Atherosclerotic heart disease of native coronary artery without angina pectoris: Secondary | ICD-10-CM | POA: Diagnosis not present

## 2021-11-03 DIAGNOSIS — G9339 Other post infection and related fatigue syndromes: Secondary | ICD-10-CM | POA: Diagnosis not present

## 2021-11-03 DIAGNOSIS — I482 Chronic atrial fibrillation, unspecified: Secondary | ICD-10-CM | POA: Diagnosis not present

## 2021-11-03 DIAGNOSIS — E0869 Diabetes mellitus due to underlying condition with other specified complication: Secondary | ICD-10-CM | POA: Diagnosis not present

## 2021-11-03 LAB — GLUCOSE, CAPILLARY
Glucose-Capillary: 166 mg/dL — ABNORMAL HIGH (ref 70–99)
Glucose-Capillary: 111 mg/dL — ABNORMAL HIGH (ref 70–99)

## 2021-11-03 NOTE — Progress Notes (Signed)
Daily Session Note  Patient Details  Name: Todd Armstrong MRN: [id number] Date of Birth: [birth year] Referring Provider:   Flowsheet Row Pulmonary Rehab from 10/27/2021 in [facility] Cardiac and Pulmonary Rehab  Referring Provider Lavone Orn MD       Encounter Date: 11/03/2021  Check In:  Session Check In - 11/03/21 0915       Check-In   Supervising physician immediately available to respond to emergencies See telemetry face sheet for immediately available ER MD    Location [facility]-Cardiac & Pulmonary Rehab    Staff Present Birdie Sons, MPA, RN;Amanda Oletta Darter, BA, ACSM CEP, Exercise Physiologist;Melissa Tollette, RDN, Tawanna Solo, MS, ASCM CEP, Exercise Physiologist    Virtual Visit No    Medication changes reported     No    Fall or balance concerns reported    No    Warm-up and Cool-down Performed on first and last piece of equipment    Resistance Training Performed Yes    VAD Patient? No    PAD/SET Patient? No      Pain Assessment   Currently in Pain? No/denies                Social History   Tobacco Use  Smoking Status Former   Packs/day: 1.00   Years: 8.00   Pack years: 8.00   Types: Cigarettes   Quit date: 09/12/1968   Years since quitting: 53.1  Smokeless Tobacco Never  Tobacco Comments   quit smoking cigarettes > 40 years ago    Goals Met:  Independence with exercise equipment Exercise tolerated well No report of concerns or symptoms today Strength training completed today  Goals Unmet:  Not Applicable  Comments: Pt able to follow exercise prescription today without complaint.  Will continue to monitor for progression.    Dr. Emily Filbert is Medical Director for [place].  Dr. Ottie Glazier is Medical Director for [facility] Pulmonary Rehabilitation.

## 2021-11-03 NOTE — Progress Notes (Signed)
Cardiology Office Note    Date:  11/04/2021   ID:  Todd Armstrong, DOB [birth year], MRN [id number]  PCP:  Lavone Orn, MD  Cardiologist:  [name], MD  Electrophysiologist:  None   Chief Complaint: f/u CHF  History of Present Illness:   Todd Armstrong is a 87 y.o. male with history of CAD with remote LAD stent 2009, HTN, DM, HLD, carotid stenosis s/p R CEA 2019 (followed by [facility], last duplex 09/2021 41-28% RICA 7-86% LICA), cardiomyopathy/chronic combined CHF, persistent atrial fib, moderate pulm HTN, CKD stage 3a, chronic appearing anemia, emphysema (followed by pulm), DM, stroke, mild AI/MR who presents for follow-up.   Prior history as noted above. With regard to prior atrial fib, he had DCCV but reverted back to atrial fib and has been managed with rate control strategy. He was recently admitted 09/2021 with CHF with notation of increased dietary sodium intake. 2D echo 09/2021 EF 45-50%, global HK, moderately elevated PASP, mod LAE, mild MR, mild AI/mild-mod aortic sclerosis without stenosis. Prior EF 04/2021 was 50-55%. He was managed with diuresis. He also had some bradycardia during admission with recommendation to watch for tachy-brady syndrome. Diltiazem and carvedilol were changed to metoprolol. His lasix was switched to torsemide.  He is seen back for follow-up today overall doing well. Denies any recurrent SOB, edema. No chest pain, palpitations, syncope, dizziness, or any specific complaints. Weight is maintaining 135-136 by home scale. He is now watching sodium intake.   Labwork independently reviewed: 10/2021 K 4.3, Cr 1.33 09/2021 LFTs ok, Mg 2.4, TSH wnl, Hgb 10.4, plt 151, A1c 6.4 05/2021 LDL 38   Past Medical History:  Diagnosis Date   Acute myocardial infarction    Anemia    Aortic insufficiency    Carotid stenosis    Chronic combined systolic and diastolic CHF (congestive heart failure) (HCC)    Coronary artery disease    PTCA and stenting of his  right coronary artery. 3.5 x 16-mm Liberte stent.               Diabetes mellitus without complication ([place])    Dyslipidemia    Emphysema (subcutaneous) (surgical) resulting from a procedure    Hyperlipemia    Hypertension    Mitral regurgitation    Partial tear of rotator cuff    right shoulder   Peripheral vascular disease (HCC)    Persistent atrial fibrillation ([place])    Pulmonary hypertension ([place])    Stroke [place])    Wears dentures     Past Surgical History:  Procedure Laterality Date   CARDIAC CATHETERIZATION  11/05/2008   CARDIAC CATHETERIZATION  10/22/1993   EF 60%   CARDIOVERSION N/A 06/05/2020   Procedure: CARDIOVERSION;  Surgeon: Thayer Headings, MD;  Location: [facility] ENDOSCOPY;  Service: Cardiovascular;  Laterality: N/A;   CAROTID ENDARTERECTOMY Right 03/23/2018   CORONARY ANGIOPLASTY WITH STENT PLACEMENT     ENDARTERECTOMY Right 03/23/2018   Procedure: RIGHT CAROTID ARTERY ENDARTERECTOMY;  Surgeon: Waynetta Sandy, MD;  Location: [facility];  Service: Vascular;  Laterality: Right;   FRACTURE SURGERY     righ fibula and tibia   HERNIA REPAIR  1984   KNEE ARTHROSCOPY Left    MULTIPLE TOOTH EXTRACTIONS     PATCH ANGIOPLASTY Right 03/23/2018   Procedure: WITH Rueben Bash 1CM X 6CM PATCH ANGIOPLASTY;  Surgeon: Waynetta Sandy, MD;  Location: [facility];  Service: Vascular;  Laterality: Right;   SHOULDER ARTHROSCOPY Right 06/09/2017   Procedure: ARTHROSCOPY SHOULDER SUBACROMIAL  DECOMPRESSION AND ACROMIOPLASTY;  Surgeon: Melrose Nakayama, MD;  Location: [facility];  Service: Orthopedics;  Laterality: Right;  GENERAL ANESTHESIA WITH BLOCK    Current Medications: Current Meds  Medication Sig   Accu-Chek FastClix Lancets MISC For use when checking blood sugars   albuterol (VENTOLIN HFA) 108 (90 Base) MCG/ACT inhaler Inhale 2 puffs into the lungs every 6 (six) hours as needed for wheezing or shortness of breath.   apixaban (ELIQUIS) 5 MG TABS tablet TAKE 1 TABLET BY MOUTH  TWICE  DAILY   clopidogrel (PLAVIX) 75 MG tablet TAKE 1 TABLET BY MOUTH  DAILY (Patient taking differently: Take 75 mg by mouth in the morning.)   metFORMIN (GLUCOPHAGE) 1000 MG tablet Take 1,000 mg by mouth 2 (two) times daily with a meal.   nitroGLYCERIN (NITROSTAT) 0.4 MG SL tablet Place 1 tablet (0.4 mg total) under the tongue every 5 (five) minutes as needed for chest pain.   potassium chloride (KLOR-CON) 10 MEQ tablet Take 1 tablet (10 mEq total) by mouth 2 (two) times daily.   rosuvastatin (CRESTOR) 10 MG tablet TAKE 1 TABLET BY MOUTH  DAILY   tamsulosin (FLOMAX) 0.4 MG CAPS capsule Take 0.4 mg by mouth at bedtime.    Tiotropium Bromide Monohydrate (SPIRIVA RESPIMAT) 2.5 MCG/ACT AERS Inhale 2 puffs into the lungs daily.   triamcinolone cream (KENALOG) 0.1 % Apply 1 application topically as needed (for itching).     Allergies:   Patient has no known allergies.   Social History   Socioeconomic History   Marital status: Widowed    Spouse name: Not on file   Number of children: Not on file   Years of education: Not on file   Highest education level: Not on file  Occupational History   Not on file  Tobacco Use   Smoking status: Former    Packs/day: 1.00    Years: 8.00    Pack years: 8.00    Types: Cigarettes    Quit date: 09/12/1968    Years since quitting: 53.1   Smokeless tobacco: Never   Tobacco comments:    quit smoking cigarettes > 40 years ago  Vaping Use   Vaping Use: Never used  Substance and Sexual Activity   Alcohol use: No   Drug use: No   Sexual activity: Not on file  Other Topics Concern   Not on file  Social History Narrative   Not on file   Social Determinants of Health   Financial Resource Strain: Not on file  Food Insecurity: Not on file  Transportation Needs: Not on file  Physical Activity: Not on file  Stress: Not on file  Social Connections: Not on file     Family History:  The patient's family history includes Heart attack in his father.  ROS:    Please see the history of present illness.  All other systems are reviewed and otherwise negative.    EKGs/Labs/Other Studies Reviewed:    Studies reviewed are outlined and summarized above. Reports included below if pertinent.  2D echo 09/2021   1. Left ventricular ejection fraction, by estimation, is 45 to 50%. The  left ventricle has mildly decreased function. The left ventricle  demonstrates global hypokinesis. Left ventricular diastolic parameters are  indeterminate.   2. Right ventricular systolic function is normal. The right ventricular  size is normal. There is moderately elevated pulmonary artery systolic  pressure. The estimated right ventricular systolic pressure is 14.4 mmHg.   3. Left atrial size was moderately  dilated.   4. The mitral valve is normal in structure. Mild mitral valve  regurgitation. No evidence of mitral stenosis.   5. The aortic valve is calcified. There is moderate calcification of the  aortic valve. Aortic valve regurgitation is mild. Mild to moderate aortic  valve sclerosis/calcification is present, without any evidence of aortic  stenosis.   6. The inferior vena cava is normal in size with greater than 50%  respiratory variability, suggesting right atrial pressure of 3 mmHg.   2D echo 04/2021   1. Left ventricular ejection fraction, by estimation, is 50 to 55%. The  left ventricle has low normal function. The left ventricle has no regional  wall motion abnormalities. Left ventricular diastolic function could not  be evaluated.   2. Right ventricular systolic function is normal. The right ventricular  size is normal. There is moderately elevated pulmonary artery systolic  pressure.   3. Left atrial size was moderately dilated.   4. The mitral valve is normal in structure. Mild mitral valve  regurgitation. No evidence of mitral stenosis.   5. The aortic valve is tricuspid. Aortic valve regurgitation is mild.  Mild to moderate aortic valve  sclerosis/calcification is present, without  any evidence of aortic stenosis.   6. The inferior vena cava is normal in size with greater than 50%  respiratory variability, suggesting right atrial pressure of 3 mmHg.   Carotid Duplex 09/2021      Summary:  Right Carotid: Velocities in the right ICA are consistent with a 40-59%                 stenosis.   Left Carotid: Velocities in the left ICA are consistent with a 1-39%  stenosis.   Vertebrals:  Bilateral vertebral arteries demonstrate antegrade flow.  Subclavians: Normal flow hemodynamics were seen in bilateral subclavian               arteries.   *See table(s) above for measurements and observations.       Electronically signed by Monica Martinez MD on 09/15/2021 at 1:14:31 PM.     EKG:  EKG is not ordered today, reviewed tracings from recent hospitalization  Recent Labs: 09/27/2021: B Natriuretic Peptide 464.8 09/28/2021: Hemoglobin 10.4; Platelets 151; TSH 1.377 09/29/2021: Magnesium 2.4 10/01/2021: ALT 16 10/15/2021: BUN 24; Creatinine, Ser 1.33; Potassium 4.3; Sodium 138  Recent Lipid Panel    Component Value Date/Time   CHOL 131 06/03/2021 1131   TRIG 72 06/03/2021 1131   HDL 79 06/03/2021 1131   CHOLHDL 1.7 06/03/2021 1131   CHOLHDL 2.3 10/15/2016 1215   VLDL 34 (H) 10/15/2016 1215   LDLCALC 38 06/03/2021 1131    PHYSICAL EXAM:    VS:  BP 110/74   Pulse 88   Ht 5\' 9"  (1.753 m)   Wt 137 lb 12.8 oz (62.5 kg)   SpO2 96%   BMI 20.35 kg/m   BMI: Body mass index is 20.35 kg/m.  GEN: Well nourished, well developed male in no acute distress HEENT: normocephalic, atraumatic Neck: no JVD, carotid bruits, or masses Cardiac: irregularly irregular, rate controlled; no murmurs, rubs, or gallops, no edema  Respiratory:  clear to auscultation bilaterally, normal work of breathing GI: soft, nontender, nondistended, + BS MS: no deformity or atrophy Skin: warm and dry, no rash, senile purpura noted Neuro:   Alert and Oriented x 3, Strength and sensation are intact, follows commands Psych: euthymic mood, full affect  Wt Readings from Last 3 Encounters:  11/04/21 137  lb 12.8 oz (62.5 kg)  10/27/21 136 lb 9.6 oz (62 kg)  10/01/21 142 lb 3.2 oz (64.5 kg)     ASSESSMENT & PLAN:   1. CAD with HLD goal LDL <70 - no recent angina. Dr. Acie Fredrickson has maintained him on Plavix without ASA due to concomitant Eliquis. He denies any bleeding. We will follow up a CBC today given his anemia to ensure stable. We discussed surveillance for bleeding. Continue statin. Last LDL 05/2021 at goal.  2. Chronic combined CHF with cardiomyopathy - appears euvolemic. Reviewed 2g sodium restriction, 2L fluid restriction, daily weights with patient. Check BMET today. Otherwise continue present regimen. Given advanced age and tendency for low-normal BP, would hold off on addition of ACEI/ARB/ARNI/spironolactone for now.  3. Essential HTN - BP controlled, no changes made today.  4. Persistent atrial fib - remains rate controlled. Recent concern for tachy-brady as above, so meds were adjusted. He is tolerating the changes well. His dose of Eliquis remains appropriate but we need to keep an eye on his weight (and creatinine) over time. He is currently 61.2kg by home scale, 62.1 by our scale - I told him if his weight drops below 135 he will need to call us as we will need to calculate this in kg to determine plan for Eliquis dosing. Also discussed observation for bleeding as above.  5. Mild aortic insufficiency, mitral regurgitation - noted by echo 09/2021. Follow clinically for now. Suggest that timing of repeat studies be dictated by clinical symptoms rather than concrete guideline to recheck 3-5 years.     Disposition: F/u with Dr. Acie Fredrickson or myself in 3-4 months.   Medication Adjustments/Labs and Tests Ordered: Current medicines are reviewed at length with the patient today.  Concerns regarding medicines are outlined above.  Medication changes, Labs and Tests ordered today are summarized above and listed in the Patient Instructions accessible in Encounters.   Signed, Charlie Pitter, PA-C  11/04/2021 11:11 AM    [facility] [address], [place], [place]  [address] Phone: [phone]; Fax: [phone]

## 2021-11-04 ENCOUNTER — Other Ambulatory Visit: Payer: Self-pay

## 2021-11-04 ENCOUNTER — Ambulatory Visit (INDEPENDENT_AMBULATORY_CARE_PROVIDER_SITE_OTHER): Payer: Medicare Other | Admitting: Physician Assistant

## 2021-11-04 ENCOUNTER — Encounter: Payer: Self-pay | Admitting: Physician Assistant

## 2021-11-04 ENCOUNTER — Encounter: Payer: Medicare Other | Admitting: *Deleted

## 2021-11-04 VITALS — BP 110/74 | HR 88 | Ht 69.0 in | Wt 137.8 lb

## 2021-11-04 DIAGNOSIS — E785 Hyperlipidemia, unspecified: Secondary | ICD-10-CM

## 2021-11-04 DIAGNOSIS — U099 Post covid-19 condition, unspecified: Secondary | ICD-10-CM

## 2021-11-04 DIAGNOSIS — I4819 Other persistent atrial fibrillation: Secondary | ICD-10-CM

## 2021-11-04 DIAGNOSIS — R5383 Other fatigue: Secondary | ICD-10-CM

## 2021-11-04 DIAGNOSIS — I340 Nonrheumatic mitral (valve) insufficiency: Secondary | ICD-10-CM | POA: Diagnosis not present

## 2021-11-04 DIAGNOSIS — I5042 Chronic combined systolic (congestive) and diastolic (congestive) heart failure: Secondary | ICD-10-CM | POA: Diagnosis not present

## 2021-11-04 DIAGNOSIS — I2510 Atherosclerotic heart disease of native coronary artery without angina pectoris: Secondary | ICD-10-CM | POA: Diagnosis not present

## 2021-11-04 DIAGNOSIS — J439 Emphysema, unspecified: Secondary | ICD-10-CM | POA: Diagnosis not present

## 2021-11-04 DIAGNOSIS — I351 Nonrheumatic aortic (valve) insufficiency: Secondary | ICD-10-CM | POA: Diagnosis not present

## 2021-11-04 DIAGNOSIS — G9339 Other post infection and related fatigue syndromes: Secondary | ICD-10-CM | POA: Diagnosis not present

## 2021-11-04 DIAGNOSIS — I10 Essential (primary) hypertension: Secondary | ICD-10-CM

## 2021-11-04 LAB — CBC
Hematocrit: 35.2 % — ABNORMAL LOW (ref 37.5–51.0)
Hemoglobin: 11.4 g/dL — ABNORMAL LOW (ref 13.0–17.7)
MCH: 29.8 pg (ref 26.6–33.0)
MCHC: 32.4 g/dL (ref 31.5–35.7)
MCV: 92 fL (ref 79–97)
Platelets: 166 10*3/uL (ref 150–450)
RBC: 3.82 x10E6/uL — ABNORMAL LOW (ref 4.14–5.80)
RDW: 14.9 % (ref 11.6–15.4)
WBC: 6.7 10*3/uL (ref 3.4–10.8)

## 2021-11-04 LAB — BASIC METABOLIC PANEL
BUN/Creatinine Ratio: 22 (ref 10–24)
BUN: 29 mg/dL — ABNORMAL HIGH (ref 8–27)
CO2: 26 mmol/L (ref 20–29)
Calcium: 9.5 mg/dL (ref 8.6–10.2)
Chloride: 98 mmol/L (ref 96–106)
Creatinine, Ser: 1.33 mg/dL — ABNORMAL HIGH (ref 0.76–1.27)
Glucose: 123 mg/dL — ABNORMAL HIGH (ref 70–99)
Potassium: 4 mmol/L (ref 3.5–5.2)
Sodium: 140 mmol/L (ref 134–144)
eGFR: 51 mL/min/{1.73_m2} — ABNORMAL LOW (ref >59)

## 2021-11-04 NOTE — Progress Notes (Signed)
Daily Session Note  Patient Details  Name: Todd Armstrong MRN: [id number] Date of Birth: 11/20/1931 Referring Provider:   Flowsheet Row Pulmonary Rehab from 10/27/2021 in [facility]  Referring Provider Griffin, John MD       Encounter Date: 11/04/2021  Check In:  Session Check In - 11/04/21 1349       Check-In   Supervising physician immediately available to respond to emergencies See telemetry face sheet for immediately available ER MD    Location [facility]    Staff Present Mary Jo Abernethy, RN, BSN, MA;Joseph Hood, RCP,RRT,BSRT;Kara Langdon, MS, ASCM CEP, Exercise Physiologist    Virtual Visit No    Medication changes reported     No    Fall or balance concerns reported    No    Tobacco Cessation No Change    Warm-up and Cool-down Performed on first and last piece of equipment    Resistance Training Performed Yes    VAD Patient? No    PAD/SET Patient? No      Pain Assessment   Currently in Pain? No/denies                Social History   Tobacco Use  Smoking Status Former   Packs/day: 1.00   Years: 8.00   Pack years: 8.00   Types: Cigarettes   Quit date: 09/12/1968   Years since quitting: 53.1  Smokeless Tobacco Never  Tobacco Comments   quit smoking cigarettes > 40 years ago    Goals Met:  Independence with exercise equipment Exercise tolerated well No report of concerns or symptoms today  Goals Unmet:  Not Applicable  Comments: Pt able to follow exercise prescription today without complaint.  Will continue to monitor for progression.    Dr. Mark Miller is Medical Director for [facility].  Dr. Fuad Aleskerov is Medical Director for [facility]. 

## 2021-11-04 NOTE — Patient Instructions (Addendum)
Medication Instructions:  Your physician recommends that you continue on your current medications as directed. Please refer to the Current Medication list given to you today.  *If you need a refill on your cardiac medications before your next appointment, please call your pharmacy*   Lab Work: TODAY:  BMET & CBC   If you have labs (blood work) drawn today and your tests are completely normal, you will receive your results only by: [place] (if you have MyChart) OR A paper copy in the mail If you have any lab test that is abnormal or we need to change your treatment, we will call you to review the results.   Testing/Procedures: None ordered   Follow-Up: At [facility], you and your health needs are our priority.  As part of our continuing mission to provide you with exceptional heart care, we have created designated Provider Care Teams.  These Care Teams include your primary Cardiologist (physician) and Advanced Practice Providers (APPs -  Physician Assistants and Nurse Practitioners) who all work together to provide you with the care you need, when you need it.  We recommend signing up for the patient portal called "MyChart".  Sign up information is provided on this After Visit Summary.  MyChart is used to connect with patients for Virtual Visits (Telemedicine).  Patients are able to view lab/test results, encounter notes, upcoming appointments, etc.  Non-urgent messages can be sent to your provider as well.   To learn more about what you can do with MyChart, go to [email or web address].    Your next appointment:   3-4 month(s)  The format for your next appointment:   In Person  Provider:   [name], MD     Other Instructions

## 2021-11-11 ENCOUNTER — Encounter: Payer: Self-pay | Admitting: *Deleted

## 2021-11-11 DIAGNOSIS — J439 Emphysema, unspecified: Secondary | ICD-10-CM

## 2021-11-11 NOTE — Progress Notes (Signed)
Pulmonary Individual Treatment Plan  Patient Details  Name: Todd Armstrong MRN: [id number] Date of Birth: [birth year] Referring Provider:   Flowsheet Row Pulmonary Rehab from 10/27/2021 in [facility] Cardiac and Pulmonary Rehab  Referring Provider Lavone Orn MD       Initial Encounter Date:  Flowsheet Row Pulmonary Rehab from 10/27/2021 in [facility] Cardiac and Pulmonary Rehab  Date 10/27/21       Visit Diagnosis: Pulmonary emphysema, unspecified emphysema type ([place])  Patient's Home Medications on Admission:  Current Outpatient Medications:    Accu-Chek FastClix Lancets MISC, For use when checking blood sugars, Disp: , Rfl:    albuterol (VENTOLIN HFA) 108 (90 Base) MCG/ACT inhaler, Inhale 2 puffs into the lungs every 6 (six) hours as needed for wheezing or shortness of breath., Disp: 8 g, Rfl: 6   apixaban (ELIQUIS) 5 MG TABS tablet, TAKE 1 TABLET BY MOUTH  TWICE DAILY, Disp: 180 tablet, Rfl: 1   clopidogrel (PLAVIX) 75 MG tablet, TAKE 1 TABLET BY MOUTH  DAILY (Patient taking differently: Take 75 mg by mouth in the morning.), Disp: 90 tablet, Rfl: 2   metFORMIN (GLUCOPHAGE) 1000 MG tablet, Take 1,000 mg by mouth 2 (two) times daily with a meal., Disp: , Rfl:    metoprolol tartrate (LOPRESSOR) 25 MG tablet, Take 0.5 tablets (12.5 mg total) by mouth 2 (two) times daily., Disp: 30 tablet, Rfl: 0   nitroGLYCERIN (NITROSTAT) 0.4 MG SL tablet, Place 1 tablet (0.4 mg total) under the tongue every 5 (five) minutes as needed for chest pain., Disp: 25 tablet, Rfl: 6   potassium chloride (KLOR-CON) 10 MEQ tablet, Take 1 tablet (10 mEq total) by mouth 2 (two) times daily., Disp: 60 tablet, Rfl: 0   rosuvastatin (CRESTOR) 10 MG tablet, TAKE 1 TABLET BY MOUTH  DAILY, Disp: 90 tablet, Rfl: 2   tamsulosin (FLOMAX) 0.4 MG CAPS capsule, Take 0.4 mg by mouth at bedtime. , Disp: , Rfl:    Tiotropium Bromide Monohydrate (SPIRIVA RESPIMAT) 2.5 MCG/ACT AERS, Inhale 2 puffs into the lungs daily., Disp: 4 g, Rfl:  5   torsemide (DEMADEX) 20 MG tablet, Take 1 tablet (20 mg total) by mouth daily., Disp: 30 tablet, Rfl: 0   triamcinolone cream (KENALOG) 0.1 %, Apply 1 application topically as needed (for itching)., Disp: , Rfl:   Past Medical History: Past Medical History:  Diagnosis Date   Acute myocardial infarction    Anemia    Aortic insufficiency    Carotid stenosis    Chronic combined systolic and diastolic CHF (congestive heart failure) (HCC)    Coronary artery disease    PTCA and stenting of his right coronary artery. 3.5 x 16-mm Liberte stent.               Diabetes mellitus without complication (HCC)    Dyslipidemia    Emphysema (subcutaneous) (surgical) resulting from a procedure    Hyperlipemia    Hypertension    Mitral regurgitation    Partial tear of rotator cuff    right shoulder   Peripheral vascular disease (HCC)    Persistent atrial fibrillation ([place])    Pulmonary hypertension (HCC)    Stroke ([place])    Wears dentures     Tobacco Use: Social History   Tobacco Use  Smoking Status Former   Packs/day: 1.00   Years: 8.00   Pack years: 8.00   Types: Cigarettes   Quit date: 09/12/1968   Years since quitting: 53.2  Smokeless Tobacco Never  Tobacco Comments  quit smoking cigarettes > 40 years ago    Labs: Recent Review Flowsheet Data     Labs for ITP Cardiac and Pulmonary Rehab Latest Ref Rng & Units 08/16/2018 02/19/2019 04/22/2020 06/03/2021 09/28/2021   Cholestrol 100 - 199 mg/dL 125 132 148 131 -   LDLCALC 0 - 99 mg/dL 38 37 52 38 -   HDL >39 mg/dL 66 76 75 79 -   Trlycerides 0 - 149 mg/dL 103 96 119 72 -   Hemoglobin A1c 4.8 - 5.6 % - - - - 6.3(H)        Pulmonary Assessment Scores:  Pulmonary Assessment Scores     Row Name 10/27/21 1156         ADL UCSD   ADL Phase Entry     SOB Score total 46     Rest 0     Walk 4     Stairs 5     Bath 0     Dress 1     Shop 5       CAT Score   CAT Score 8       mMRC Score   mMRC Score 2               UCSD: Self-administered rating of dyspnea associated with activities of daily living (ADLs) 6-point scale (0 = "not at all" to 5 = "maximal or unable to do because of breathlessness")  Scoring Scores range from 0 to 120.  Minimally important difference is 5 units  CAT: CAT can identify the health impairment of COPD patients and is better correlated with disease progression.  CAT has a scoring range of zero to 40. The CAT score is classified into four groups of low (less than 10), medium (10 - 20), high (21-30) and very high (31-40) based on the impact level of disease on health status. A CAT score over 10 suggests significant symptoms.  A worsening CAT score could be explained by an exacerbation, poor medication adherence, poor inhaler technique, or progression of COPD or comorbid conditions.  CAT MCID is 2 points  mMRC: mMRC (Modified Medical Research Council) Dyspnea Scale is used to assess the degree of baseline functional disability in patients of respiratory disease due to dyspnea. No minimal important difference is established. A decrease in score of 1 point or greater is considered a positive change.   Pulmonary Function Assessment:  Pulmonary Function Assessment - 10/26/21 1545       Breath   Shortness of Breath Yes;Limiting activity             Exercise Target Goals: Exercise Program Goal: Individual exercise prescription set using results from initial 6 min walk test and THRR while considering  patient's activity barriers and safety.   Exercise Prescription Goal: Initial exercise prescription builds to 30-45 minutes a day of aerobic activity, 2-3 days per week.  Home exercise guidelines will be given to patient during program as part of exercise prescription that the participant will acknowledge.  Education: Aerobic Exercise: - Group verbal and visual presentation on the components of exercise prescription. Introduces F.I.T.T principle from ACSM for exercise  prescriptions.  Reviews F.I.T.T. principles of aerobic exercise including progression. Written material given at graduation.   Education: Resistance Exercise: - Group verbal and visual presentation on the components of exercise prescription. Introduces F.I.T.T principle from ACSM for exercise prescriptions  Reviews F.I.T.T. principles of resistance exercise including progression. Written material given at graduation.    Education: Exercise & Equipment  Safety: - Individual verbal instruction and demonstration of equipment use and safety with use of the equipment. Flowsheet Row Pulmonary Rehab from 10/26/2021 in [facility] Cardiac and Pulmonary Rehab  Date 10/26/21  Educator [facility]  Instruction Review Code 1- Verbalizes Understanding       Education: Exercise Physiology & General Exercise Guidelines: - Group verbal and written instruction with models to review the exercise physiology of the cardiovascular system and associated critical values. Provides general exercise guidelines with specific guidelines to those with heart or lung disease.  Flowsheet Row Pulmonary Rehab from 10/29/2021 in [facility] Cardiac and Pulmonary Rehab  Education need identified 10/27/21       Education: Flexibility, Balance, Mind/Body Relaxation: - Group verbal and visual presentation with interactive activity on the components of exercise prescription. Introduces F.I.T.T principle from ACSM for exercise prescriptions. Reviews F.I.T.T. principles of flexibility and balance exercise training including progression. Also discusses the mind body connection.  Reviews various relaxation techniques to help reduce and manage stress (i.e. Deep breathing, progressive muscle relaxation, and visualization). Balance handout provided to take home. Written material given at graduation.   Activity Barriers & Risk Stratification:  Activity Barriers & Cardiac Risk Stratification - 10/27/21 1454       Activity Barriers & Cardiac Risk  Stratification   Activity Barriers Assistive Device;Shortness of Breath;Deconditioning;Muscular Weakness;Other (comment)    Comments Rod in right leg             6 Minute Walk:  6 Minute Walk     Row Name 10/27/21 1455         6 Minute Walk   Phase Initial     Distance 955 feet     Walk Time 6 minutes     MPH 1.8     METS 1.82     RPE 13     Perceived Dyspnea  0     VO2 Peak 6.4     Symptoms No     Resting HR 87 bpm     Resting BP 138/64     Resting Oxygen Saturation  98 %     Exercise Oxygen Saturation  during 6 min walk 97 %     Max Ex. HR 109 bpm     Max Ex. BP 144/66     2 Minute Post BP 122/64       Interval HR   1 Minute HR 96     2 Minute HR 98     3 Minute HR 109     4 Minute HR 106     5 Minute HR 99     6 Minute HR 103     2 Minute Post HR 89     Interval Heart Rate? Yes       Interval Oxygen   Interval Oxygen? Yes     Baseline Oxygen Saturation % 98 %     1 Minute Oxygen Saturation % 96 %     1 Minute Liters of Oxygen 0 L  RA     2 Minute Oxygen Saturation % 98 %     2 Minute Liters of Oxygen 0 L     3 Minute Oxygen Saturation % 98 %     3 Minute Liters of Oxygen 0 L     4 Minute Oxygen Saturation % 98 %     4 Minute Liters of Oxygen 0 L     5 Minute Oxygen Saturation % 98 %     5 Minute Liters  of Oxygen 0 L     6 Minute Oxygen Saturation % 98 %     6 Minute Liters of Oxygen 0 L     2 Minute Post Oxygen Saturation % 97 %     2 Minute Post Liters of Oxygen 0 L             Oxygen Initial Assessment:  Oxygen Initial Assessment - 10/27/21 1156       Home Oxygen   Home Oxygen Device None    Sleep Oxygen Prescription None    Home Exercise Oxygen Prescription None    Home Resting Oxygen Prescription None    Compliance with Home Oxygen Use Yes      Initial 6 min Walk   Oxygen Used None      Program Oxygen Prescription   Program Oxygen Prescription None      Intervention   Short Term Goals To learn and exhibit compliance with  exercise, home and travel O2 prescription;To learn and understand importance of monitoring SPO2 with pulse oximeter and demonstrate accurate use of the pulse oximeter.;To learn and demonstrate proper pursed lip breathing techniques or other breathing techniques. ;To learn and understand importance of maintaining oxygen saturations>88%;To learn and demonstrate proper use of respiratory medications    Long  Term Goals Exhibits compliance with exercise, home  and travel O2 prescription;Verbalizes importance of monitoring SPO2 with pulse oximeter and return demonstration;Maintenance of O2 saturations>88%;Exhibits proper breathing techniques, such as pursed lip breathing or other method taught during program session;Compliance with respiratory medication;Demonstrates proper use of MDI's             Oxygen Re-Evaluation:  Oxygen Re-Evaluation     Row Name 10/29/21 1000             Program Oxygen Prescription   Program Oxygen Prescription None         Home Oxygen   Home Oxygen Device None       Sleep Oxygen Prescription None       Home Exercise Oxygen Prescription None       Home Resting Oxygen Prescription None       Compliance with Home Oxygen Use Yes         Goals/Expected Outcomes   Short Term Goals To learn and exhibit compliance with exercise, home and travel O2 prescription;To learn and understand importance of monitoring SPO2 with pulse oximeter and demonstrate accurate use of the pulse oximeter.;To learn and demonstrate proper pursed lip breathing techniques or other breathing techniques. ;To learn and understand importance of maintaining oxygen saturations>88%       Long  Term Goals Exhibits compliance with exercise, home  and travel O2 prescription;Verbalizes importance of monitoring SPO2 with pulse oximeter and return demonstration;Maintenance of O2 saturations>88%;Exhibits proper breathing techniques, such as pursed lip breathing or other method taught during program  session;Compliance with respiratory medication       Comments Reviewed PLB technique with pt.  Talked about how it works and it's importance in maintaining their exercise saturations.       Goals/Expected Outcomes Short: Become more profiecient at using PLB.   Long: Become independent at using PLB.                Oxygen Discharge (Final Oxygen Re-Evaluation):  Oxygen Re-Evaluation - 10/29/21 1000       Program Oxygen Prescription   Program Oxygen Prescription None      Home Oxygen   Home Oxygen Device None    Sleep  Oxygen Prescription None    Home Exercise Oxygen Prescription None    Home Resting Oxygen Prescription None    Compliance with Home Oxygen Use Yes      Goals/Expected Outcomes   Short Term Goals To learn and exhibit compliance with exercise, home and travel O2 prescription;To learn and understand importance of monitoring SPO2 with pulse oximeter and demonstrate accurate use of the pulse oximeter.;To learn and demonstrate proper pursed lip breathing techniques or other breathing techniques. ;To learn and understand importance of maintaining oxygen saturations>88%    Long  Term Goals Exhibits compliance with exercise, home  and travel O2 prescription;Verbalizes importance of monitoring SPO2 with pulse oximeter and return demonstration;Maintenance of O2 saturations>88%;Exhibits proper breathing techniques, such as pursed lip breathing or other method taught during program session;Compliance with respiratory medication    Comments Reviewed PLB technique with pt.  Talked about how it works and it's importance in maintaining their exercise saturations.    Goals/Expected Outcomes Short: Become more profiecient at using PLB.   Long: Become independent at using PLB.             Initial Exercise Prescription:  Initial Exercise Prescription - 10/27/21 1400       Date of Initial Exercise RX and Referring Provider   Date 10/27/21    Referring Provider Lavone Orn MD       Oxygen   Maintain Oxygen Saturation 88% or higher      Recumbant Bike   Level 1    RPM 60    Watts 20    Minutes 15    METs 1.8      NuStep   Level 1    SPM 80    Minutes 15    METs 1.8      Track   Laps 14    Minutes 15    METs 1.76      Prescription Details   Frequency (times per week) 2    Duration Progress to 30 minutes of continuous aerobic without signs/symptoms of physical distress      Intensity   THRR 40-80% of Max Heartrate 105-123    Ratings of Perceived Exertion 11-13    Perceived Dyspnea 0-4      Progression   Progression Continue to progress workloads to maintain intensity without signs/symptoms of physical distress.      Resistance Training   Training Prescription Yes    Weight 3 lb    Reps 10-15             Perform Capillary Blood Glucose checks as needed.  Exercise Prescription Changes:   Exercise Prescription Changes     Row Name 10/27/21 1400 11/04/21 0900           Response to Exercise   Blood Pressure (Admit) 138/64 112/60      Blood Pressure (Exercise) 144/66 144/72      Blood Pressure (Exit) 122/64 118/62      Heart Rate (Admit) 87 bpm 65 bpm      Heart Rate (Exercise) 109 bpm 112 bpm      Heart Rate (Exit) 89 bpm 102 bpm      Oxygen Saturation (Admit) 98 % 97 %      Oxygen Saturation (Exercise) 97 % 92 %      Oxygen Saturation (Exit) 97 % 98 %      Rating of Perceived Exertion (Exercise) 13 13      Perceived Dyspnea (Exercise) 0 1  Symptoms none none      Comments walk test results third session      Duration -- Continue with 30 min of aerobic exercise without signs/symptoms of physical distress.      Intensity -- THRR unchanged        Progression   Progression -- Continue to progress workloads to maintain intensity without signs/symptoms of physical distress.      Average METs -- 1.4        Resistance Training   Training Prescription -- Yes      Weight -- 3 lb      Reps -- 10-15        Arm Ergometer   Level  -- 1      Minutes -- 15      METs -- 1        Track   Laps -- 14      Minutes -- 15      METs -- 1.76               Exercise Comments:   Exercise Comments     Row Name 10/29/21 [phone]           Exercise Comments First full day of exercise!  Patient was oriented to gym and equipment including functions, settings, policies, and procedures.  Patient's individual exercise prescription and treatment plan were reviewed.  All starting workloads were established based on the results of the 6 minute walk test done at initial orientation visit.  The plan for exercise progression was also introduced and progression will be customized based on patient's performance and goals.                Exercise Goals and Review:   Exercise Goals     Row Name 10/27/21 1501             Exercise Goals   Increase Physical Activity Yes       Intervention Provide advice, education, support and counseling about physical activity/exercise needs.;Develop an individualized exercise prescription for aerobic and resistive training based on initial evaluation findings, risk stratification, comorbidities and participant's personal goals.       Expected Outcomes Short Term: Attend rehab on a regular basis to increase amount of physical activity.;Long Term: Add in home exercise to make exercise part of routine and to increase amount of physical activity.;Long Term: Exercising regularly at least 3-5 days a week.       Increase Strength and Stamina Yes       Intervention Provide advice, education, support and counseling about physical activity/exercise needs.;Develop an individualized exercise prescription for aerobic and resistive training based on initial evaluation findings, risk stratification, comorbidities and participant's personal goals.       Expected Outcomes Short Term: Increase workloads from initial exercise prescription for resistance, speed, and METs.;Short Term: Perform resistance training  exercises routinely during rehab and add in resistance training at home;Long Term: Improve cardiorespiratory fitness, muscular endurance and strength as measured by increased METs and functional capacity (6MWT)       Able to understand and use rate of perceived exertion (RPE) scale Yes       Intervention Provide education and explanation on how to use RPE scale       Expected Outcomes Short Term: Able to use RPE daily in rehab to express subjective intensity level;Long Term:  Able to use RPE to guide intensity level when exercising independently       Able to understand and use Dyspnea  scale Yes       Intervention Provide education and explanation on how to use Dyspnea scale       Expected Outcomes Short Term: Able to use Dyspnea scale daily in rehab to express subjective sense of shortness of breath during exertion;Long Term: Able to use Dyspnea scale to guide intensity level when exercising independently       Knowledge and understanding of Target Heart Rate Range (THRR) Yes       Intervention Provide education and explanation of THRR including how the numbers were predicted and where they are located for reference       Expected Outcomes Short Term: Able to state/look up THRR;Long Term: Able to use THRR to govern intensity when exercising independently;Short Term: Able to use daily as guideline for intensity in rehab       Able to check pulse independently Yes       Intervention Provide education and demonstration on how to check pulse in carotid and radial arteries.;Review the importance of being able to check your own pulse for safety during independent exercise       Expected Outcomes Short Term: Able to explain why pulse checking is important during independent exercise;Long Term: Able to check pulse independently and accurately       Understanding of Exercise Prescription Yes       Intervention Provide education, explanation, and written materials on patient's individual exercise prescription        Expected Outcomes Short Term: Able to explain program exercise prescription;Long Term: Able to explain home exercise prescription to exercise independently                Exercise Goals Re-Evaluation :  Exercise Goals Re-Evaluation     Row Name 10/29/21 0952 11/04/21 0903           Exercise Goal Re-Evaluation   Exercise Goals Review Increase Physical Activity;Able to understand and use rate of perceived exertion (RPE) scale;Knowledge and understanding of Target Heart Rate Range (THRR);Understanding of Exercise Prescription;Increase Strength and Stamina;Able to understand and use Dyspnea scale;Able to check pulse independently Increase Physical Activity;Increase Strength and Stamina      Comments Reviewed RPE and dyspnea scales, THR and program prescription with pt today.  Pt voiced understanding and was given a copy of goals to take home. Todd Armstrong is tolerating exercise well so far.  Oxygen has stayed in the 90s and he reaches THR range      Expected Outcomes Short: Use RPE daily to regulate intensity. Long: Follow program prescription in THR. Short: attend consistently Long:  build overall stamina               Discharge Exercise Prescription (Final Exercise Prescription Changes):  Exercise Prescription Changes - 11/04/21 0900       Response to Exercise   Blood Pressure (Admit) 112/60    Blood Pressure (Exercise) 144/72    Blood Pressure (Exit) 118/62    Heart Rate (Admit) 65 bpm    Heart Rate (Exercise) 112 bpm    Heart Rate (Exit) 102 bpm    Oxygen Saturation (Admit) 97 %    Oxygen Saturation (Exercise) 92 %    Oxygen Saturation (Exit) 98 %    Rating of Perceived Exertion (Exercise) 13    Perceived Dyspnea (Exercise) 1    Symptoms none    Comments third session    Duration Continue with 30 min of aerobic exercise without signs/symptoms of physical distress.    Intensity THRR unchanged  Progression   Progression Continue to progress workloads to maintain  intensity without signs/symptoms of physical distress.    Average METs 1.4      Resistance Training   Training Prescription Yes    Weight 3 lb    Reps 10-15      Arm Ergometer   Level 1    Minutes 15    METs 1      Track   Laps 14    Minutes 15    METs 1.76             Nutrition:  Target Goals: Understanding of nutrition guidelines, daily intake of sodium 1500mg , cholesterol 200mg , calories 30% from fat and 7% or less from saturated fats, daily to have 5 or more servings of fruits and vegetables.  Education: All About Nutrition: -Group instruction provided by verbal, written material, interactive activities, discussions, models, and posters to present general guidelines for heart healthy nutrition including fat, fiber, MyPlate, the role of sodium in heart healthy nutrition, utilization of the nutrition label, and utilization of this knowledge for meal planning. Follow up email sent as well. Written material given at graduation. Flowsheet Row Pulmonary Rehab from 10/29/2021 in [facility] Cardiac and Pulmonary Rehab  Date 10/29/21  Educator [facility]  Instruction Review Code 1- Verbalizes Understanding       Biometrics:  Pre Biometrics - 10/27/21 1454       Pre Biometrics   Height 5' 6.5" (1.689 m)    Weight 136 lb 9.6 oz (62 kg)    BMI (Calculated) 21.72    Single Leg Stand 4.5 seconds              Nutrition Therapy Plan and Nutrition Goals:  Nutrition Therapy & Goals - 10/27/21 1450       Intervention Plan   Intervention Prescribe, educate and counsel regarding individualized specific dietary modifications aiming towards targeted core components such as weight, hypertension, lipid management, diabetes, heart failure and other comorbidities.    Expected Outcomes Short Term Goal: Understand basic principles of dietary content, such as calories, fat, sodium, cholesterol and nutrients.;Short Term Goal: A plan has been developed with personal nutrition goals set during  dietitian appointment.;Long Term Goal: Adherence to prescribed nutrition plan.             Nutrition Assessments:  MEDIFICTS Score Key: ?70 Need to make dietary changes  40-70 Heart Healthy Diet ? 40 Therapeutic Level Cholesterol Diet  Flowsheet Row Pulmonary Rehab from 10/27/2021 in [facility] Cardiac and Pulmonary Rehab  Picture Your Plate Total Score on Admission 63      Picture Your Plate Scores: <84 Unhealthy dietary pattern with much room for improvement. 41-50 Dietary pattern unlikely to meet recommendations for good health and room for improvement. 51-60 More healthful dietary pattern, with some room for improvement.  >60 Healthy dietary pattern, although there may be some specific behaviors that could be improved.   Nutrition Goals Re-Evaluation:   Nutrition Goals Discharge (Final Nutrition Goals Re-Evaluation):   Psychosocial: Target Goals: Acknowledge presence or absence of significant depression and/or stress, maximize coping skills, provide positive support system. Participant is able to verbalize types and ability to use techniques and skills needed for reducing stress and depression.   Education: Stress, Anxiety, and Depression - Group verbal and visual presentation to define topics covered.  Reviews how body is impacted by stress, anxiety, and depression.  Also discusses healthy ways to reduce stress and to treat/manage anxiety and depression.  Written material given at  graduation.   Education: Sleep Hygiene -Provides group verbal and written instruction about how sleep can affect your health.  Define sleep hygiene, discuss sleep cycles and impact of sleep habits. Review good sleep hygiene tips.    Initial Review & Psychosocial Screening:  Initial Psych Review & Screening - 10/26/21 [place]? Yes    Comments He can look to his neighbors, family and daughter for support. He has a positive outlook on his health.       Barriers   Psychosocial barriers to participate in program There are no identifiable barriers or psychosocial needs.;The patient should benefit from training in stress management and relaxation.      Screening Interventions   Interventions To provide support and resources with identified psychosocial needs;Encouraged to exercise;Provide feedback about the scores to participant    Expected Outcomes Short Term goal: Utilizing psychosocial counselor, staff and physician to assist with identification of specific Stressors or current issues interfering with healing process. Setting desired goal for each stressor or current issue identified.;Long Term Goal: Stressors or current issues are controlled or eliminated.;Short Term goal: Identification and review with participant of any Quality of Life or Depression concerns found by scoring the questionnaire.;Long Term goal: The participant improves quality of Life and PHQ9 Scores as seen by post scores and/or verbalization of changes             Quality of Life Scores:  Scores of 19 and below usually indicate a poorer quality of life in these areas.  A difference of  2-3 points is a clinically meaningful difference.  A difference of 2-3 points in the total score of the Quality of Life Index has been associated with significant improvement in overall quality of life, self-image, physical symptoms, and general health in studies assessing change in quality of life.  PHQ-9: Recent Review Flowsheet Data     Depression screen [facility] 2/9 10/27/2021   Decreased Interest 0   Down, Depressed, Hopeless 0   PHQ - 2 Score 0   Altered sleeping 3   Tired, decreased energy 1   Change in appetite 3   Feeling bad or failure about yourself  0   Trouble concentrating 0   Moving slowly or fidgety/restless 0   Suicidal thoughts 0   PHQ-9 Score 7   Difficult doing work/chores Not difficult at all      Interpretation of Total Score  Total Score Depression Severity:   1-4 = Minimal depression, 5-9 = Mild depression, 10-14 = Moderate depression, 15-19 = Moderately severe depression, 20-27 = Severe depression   Psychosocial Evaluation and Intervention:  Psychosocial Evaluation - 10/26/21 1548       Psychosocial Evaluation & Interventions   Interventions Encouraged to exercise with the program and follow exercise prescription;Relaxation education;Stress management education    Comments He can look to his neighbors, family and daughter for support. He has a positive outlook on his health.    Expected Outcomes Short: Start [facility] to help with mood. Long: Maintain a healthy mental state.    Continue Psychosocial Services  Follow up required by staff             Psychosocial Re-Evaluation:   Psychosocial Discharge (Final Psychosocial Re-Evaluation):   Education: Education Goals: Education classes will be provided on a weekly basis, covering required topics. Participant will state understanding/return demonstration of topics presented.  Learning Barriers/Preferences:  Learning Barriers/Preferences - 10/26/21 1545  Learning Barriers/Preferences   Learning Barriers None    Learning Preferences None             General Pulmonary Education Topics:  Infection Prevention: - Provides verbal and written material to individual with discussion of infection control including proper hand washing and proper equipment cleaning during exercise session. Flowsheet Row Pulmonary Rehab from 10/26/2021 in [facility] Cardiac and Pulmonary Rehab  Date 10/26/21  Educator [facility]  Instruction Review Code 1- Verbalizes Understanding       Falls Prevention: - Provides verbal and written material to individual with discussion of falls prevention and safety. Flowsheet Row Pulmonary Rehab from 10/26/2021 in [facility] Cardiac and Pulmonary Rehab  Date 10/26/21  Educator [facility]  Instruction Review Code 1- Verbalizes Understanding       Chronic Lung Disease  Review: - Group verbal instruction with posters, models, PowerPoint presentations and videos,  to review new updates, new respiratory medications, new advancements in procedures and treatments. Providing information on websites and "800" numbers for continued self-education. Includes information about supplement oxygen, available portable oxygen systems, continuous and intermittent flow rates, oxygen safety, concentrators, and Medicare reimbursement for oxygen. Explanation of Pulmonary Drugs, including class, frequency, complications, importance of spacers, rinsing mouth after steroid MDI's, and proper cleaning methods for nebulizers. Review of basic lung anatomy and physiology related to function, structure, and complications of lung disease. Review of risk factors. Discussion about methods for diagnosing sleep apnea and types of masks and machines for OSA. Includes a review of the use of types of environmental controls: home humidity, furnaces, filters, dust mite/pet prevention, HEPA vacuums. Discussion about weather changes, air quality and the benefits of nasal washing. Instruction on Warning signs, infection symptoms, calling MD promptly, preventive modes, and value of vaccinations. Review of effective airway clearance, coughing and/or vibration techniques. Emphasizing that all should Create an Action Plan. Written material given at graduation. Flowsheet Row Pulmonary Rehab from 10/29/2021 in [facility] Cardiac and Pulmonary Rehab  Education need identified 10/27/21       AED/CPR: - Group verbal and written instruction with the use of models to demonstrate the basic use of the AED with the basic ABC's of resuscitation.    Anatomy and Cardiac Procedures: - Group verbal and visual presentation and models provide information about basic cardiac anatomy and function. Reviews the testing methods done to diagnose heart disease and the outcomes of the test results. Describes the treatment choices: Medical  Management, Angioplasty, or Coronary Bypass Surgery for treating various heart conditions including Myocardial Infarction, Angina, Valve Disease, and Cardiac Arrhythmias.  Written material given at graduation.   Medication Safety: - Group verbal and visual instruction to review commonly prescribed medications for heart and lung disease. Reviews the medication, class of the drug, and side effects. Includes the steps to properly store meds and maintain the prescription regimen.  Written material given at graduation.   Other: -Provides group and verbal instruction on various topics (see comments)   Knowledge Questionnaire Score:  Knowledge Questionnaire Score - 10/27/21 1449       Knowledge Questionnaire Score   Pre Score 12/18: Lung disease, ADL, O2, PLB              Core Components/Risk Factors/Patient Goals at Admission:  Personal Goals and Risk Factors at Admission - 10/27/21 1502       Core Components/Risk Factors/Patient Goals on Admission    Weight Management Yes;Weight Maintenance    Intervention Weight Management: Develop a combined nutrition and exercise program designed to reach  desired caloric intake, while maintaining appropriate intake of nutrient and fiber, sodium and fats, and appropriate energy expenditure required for the weight goal.;Weight Management: Provide education and appropriate resources to help participant work on and attain dietary goals.;Weight Management/Obesity: Establish reasonable short term and long term weight goals.    Admit Weight 136 lb (61.7 kg)    Goal Weight: Short Term 136 lb (61.7 kg)    Goal Weight: Long Term 136 lb (61.7 kg)    Expected Outcomes Short Term: Continue to assess and modify interventions until short term weight is achieved;Long Term: Adherence to nutrition and physical activity/exercise program aimed toward attainment of established weight goal;Understanding recommendations for meals to include 15-35% energy as protein, 25-35%  energy from fat, 35-60% energy from carbohydrates, less than 200mg  of dietary cholesterol, 20-35 gm of total fiber daily;Understanding of distribution of calorie intake throughout the day with the consumption of 4-5 meals/snacks;Weight Maintenance: Understanding of the daily nutrition guidelines, which includes 25-35% calories from fat, 7% or less cal from saturated fats, less than 200mg  cholesterol, less than 1.5gm of sodium, & 5 or more servings of fruits and vegetables daily    Improve shortness of breath with ADL's Yes    Intervention Provide education, individualized exercise plan and daily activity instruction to help decrease symptoms of SOB with activities of daily living.    Expected Outcomes Short Term: Improve cardiorespiratory fitness to achieve a reduction of symptoms when performing ADLs;Long Term: Be able to perform more ADLs without symptoms or delay the onset of symptoms    Diabetes Yes    Intervention Provide education about signs/symptoms and action to take for hypo/hyperglycemia.;Provide education about proper nutrition, including hydration, and aerobic/resistive exercise prescription along with prescribed medications to achieve blood glucose in normal ranges: Fasting glucose 65-99 mg/dL    Expected Outcomes Short Term: Participant verbalizes understanding of the signs/symptoms and immediate care of hyper/hypoglycemia, proper foot care and importance of medication, aerobic/resistive exercise and nutrition plan for blood glucose control.;Long Term: Attainment of HbA1C < 7%.    Hypertension Yes    Intervention Provide education on lifestyle modifcations including regular physical activity/exercise, weight management, moderate sodium restriction and increased consumption of fresh fruit, vegetables, and low fat dairy, alcohol moderation, and smoking cessation.;Monitor prescription use compliance.    Expected Outcomes Short Term: Continued assessment and intervention until BP is < 140/71mm HG  in hypertensive participants. < 130/59mm HG in hypertensive participants with diabetes, heart failure or chronic kidney disease.;Long Term: Maintenance of blood pressure at goal levels.    Lipids Yes    Intervention Provide education and support for participant on nutrition & aerobic/resistive exercise along with prescribed medications to achieve LDL 70mg , HDL >40mg .    Expected Outcomes Short Term: Participant states understanding of desired cholesterol values and is compliant with medications prescribed. Participant is following exercise prescription and nutrition guidelines.;Long Term: Cholesterol controlled with medications as prescribed, with individualized exercise RX and with personalized nutrition plan. Value goals: LDL < 70mg , HDL > 40 mg.             Education:Diabetes - Individual verbal and written instruction to review signs/symptoms of diabetes, desired ranges of glucose level fasting, after meals and with exercise. Acknowledge that pre and post exercise glucose checks will be done for 3 sessions at entry of program. Flowsheet Row Pulmonary Rehab from 10/26/2021 in [facility] Cardiac and Pulmonary Rehab  Date 10/26/21  Educator [facility]  Instruction Review Code 1- Verbalizes Understanding  Know Your Numbers and Heart Failure: - Group verbal and visual instruction to discuss disease risk factors for cardiac and pulmonary disease and treatment options.  Reviews associated critical values for Overweight/Obesity, Hypertension, Cholesterol, and Diabetes.  Discusses basics of heart failure: signs/symptoms and treatments.  Introduces Heart Failure Zone chart for action plan for heart failure.  Written material given at graduation.   Core Components/Risk Factors/Patient Goals Review:    Core Components/Risk Factors/Patient Goals at Discharge (Final Review):    ITP Comments:  ITP Comments     Row Name 10/26/21 1543 10/27/21 1151 10/29/21 0952 11/11/21 0704     ITP Comments Virtual  Visit completed. Patient informed on EP and RD appointment and 6 Minute walk test. Patient also informed of patient health questionnaires on My Chart. Patient Verbalizes understanding. Visit diagnosis can be found in [facility] 08/20/2021. Completed 6MWT and gym orientation. Initial ITP created and sent for review to Dr. Ottie Glazier, Medical Director. First full day of exercise!  Patient was oriented to gym and equipment including functions, settings, policies, and procedures.  Patient's individual exercise prescription and treatment plan were reviewed.  All starting workloads were established based on the results of the 6 minute walk test done at initial orientation visit.  The plan for exercise progression was also introduced and progression will be customized based on patient's performance and goals. 30 Day review completed. Medical Director ITP review done, changes made as directed, and signed approval by Medical Director.   New to program             Comments:

## 2021-11-15 ENCOUNTER — Other Ambulatory Visit: Payer: Self-pay | Admitting: Cardiovascular Disease

## 2021-11-16 ENCOUNTER — Encounter: Payer: Self-pay | Admitting: *Deleted

## 2021-11-16 DIAGNOSIS — J439 Emphysema, unspecified: Secondary | ICD-10-CM

## 2021-11-18 ENCOUNTER — Encounter: Payer: Self-pay | Admitting: *Deleted

## 2021-11-18 ENCOUNTER — Encounter: Payer: Medicare Other | Attending: Internal Medicine

## 2021-11-18 ENCOUNTER — Other Ambulatory Visit: Payer: Self-pay

## 2021-11-18 DIAGNOSIS — U099 Post covid-19 condition, unspecified: Secondary | ICD-10-CM | POA: Insufficient documentation

## 2021-11-18 DIAGNOSIS — R5383 Other fatigue: Secondary | ICD-10-CM | POA: Insufficient documentation

## 2021-11-18 DIAGNOSIS — J439 Emphysema, unspecified: Secondary | ICD-10-CM | POA: Insufficient documentation

## 2021-11-18 NOTE — Progress Notes (Signed)
Daily Session Note  Patient Details  Name: Todd Armstrong MRN: [id number] Date of Birth: 1931-05-21 Referring Provider:   Flowsheet Row Pulmonary Rehab from 10/27/2021 in [facility] Cardiac and Pulmonary Rehab  Referring Provider Lavone Orn MD       Encounter Date: 11/18/2021  Check In:  Session Check In - 11/18/21 0912       Check-In   Supervising physician immediately available to respond to emergencies See telemetry face sheet for immediately available ER MD    Location [facility]-Cardiac & Pulmonary Rehab    Staff Present Birdie Sons, MPA, RN;Amanda Oletta Darter, BA, ACSM CEP, Exercise Physiologist;Melissa Caiola, RDN, LDN    Virtual Visit No    Medication changes reported     No    Fall or balance concerns reported    No    Tobacco Cessation No Change    Warm-up and Cool-down Performed on first and last piece of equipment    Resistance Training Performed Yes    VAD Patient? No    PAD/SET Patient? No      Pain Assessment   Currently in Pain? No/denies                Social History   Tobacco Use  Smoking Status Former   Packs/day: 1.00   Years: 8.00   Pack years: 8.00   Types: Cigarettes   Quit date: 09/12/1968   Years since quitting: 53.2  Smokeless Tobacco Never  Tobacco Comments   quit smoking cigarettes > 40 years ago    Goals Met:  Independence with exercise equipment Exercise tolerated well No report of concerns or symptoms today Strength training completed today  Goals Unmet:  Not Applicable  Comments: Pt able to follow exercise prescription today without complaint.  Will continue to monitor for progression.    Dr. Emily Filbert is Medical Director for [place].  Dr. Ottie Glazier is Medical Director for [facility] Pulmonary Rehabilitation.

## 2021-11-20 ENCOUNTER — Other Ambulatory Visit: Payer: Self-pay

## 2021-11-20 ENCOUNTER — Encounter: Payer: Medicare Other | Admitting: *Deleted

## 2021-11-20 DIAGNOSIS — J439 Emphysema, unspecified: Secondary | ICD-10-CM | POA: Diagnosis not present

## 2021-11-20 DIAGNOSIS — U099 Post covid-19 condition, unspecified: Secondary | ICD-10-CM | POA: Diagnosis not present

## 2021-11-20 DIAGNOSIS — R5383 Other fatigue: Secondary | ICD-10-CM | POA: Diagnosis not present

## 2021-11-20 NOTE — Progress Notes (Signed)
Daily Session Note  Patient Details  Name: Todd Armstrong MRN: [id number] Date of Birth: 06/22/1931 Referring Provider:   Flowsheet Row Pulmonary Rehab from 10/27/2021 in [facility] Cardiac and Pulmonary Rehab  Referring Provider Lavone Orn MD       Encounter Date: 11/20/2021  Check In:  Session Check In - 11/20/21 8889       Check-In   Supervising physician immediately available to respond to emergencies See telemetry face sheet for immediately available ER MD    Location [facility]-Cardiac & Pulmonary Rehab    Staff Present Renita Papa, RN BSN;Joseph Roscoe, RCP,RRT,BSRT;Jessica [employer], [place], RCEP, CCRP, CCET    Virtual Visit No    Medication changes reported     No    Fall or balance concerns reported    No    Warm-up and Cool-down Performed on first and last piece of equipment    Resistance Training Performed Yes    VAD Patient? No    PAD/SET Patient? No      Pain Assessment   Currently in Pain? No/denies                Social History   Tobacco Use  Smoking Status Former   Packs/day: 1.00   Years: 8.00   Pack years: 8.00   Types: Cigarettes   Quit date: 09/12/1968   Years since quitting: 53.2  Smokeless Tobacco Never  Tobacco Comments   quit smoking cigarettes > 40 years ago    Goals Met:  Independence with exercise equipment Exercise tolerated well No report of concerns or symptoms today Strength training completed today  Goals Unmet:  Not Applicable  Comments: Pt able to follow exercise prescription today without complaint.  Will continue to monitor for progression.    Dr. Emily Filbert is Medical Director for [place].  Dr. Ottie Glazier is Medical Director for [facility] Pulmonary Rehabilitation.

## 2021-11-23 ENCOUNTER — Other Ambulatory Visit: Payer: Self-pay

## 2021-11-23 DIAGNOSIS — U099 Post covid-19 condition, unspecified: Secondary | ICD-10-CM | POA: Diagnosis not present

## 2021-11-23 DIAGNOSIS — R5383 Other fatigue: Secondary | ICD-10-CM

## 2021-11-23 DIAGNOSIS — J439 Emphysema, unspecified: Secondary | ICD-10-CM | POA: Diagnosis not present

## 2021-11-23 NOTE — Progress Notes (Signed)
Daily Session Note  Patient Details  Name: ZAKKARY THIBAULT MRN: [id number] Date of Birth: [birth year] Referring Provider:   Flowsheet Row Pulmonary Rehab from 10/27/2021 in [facility] Cardiac and Pulmonary Rehab  Referring Provider Lavone Orn MD       Encounter Date: 11/23/2021  Check In:  Session Check In - 11/23/21 0913       Check-In   Supervising physician immediately available to respond to emergencies See telemetry face sheet for immediately available ER MD    Location [facility]-Cardiac & Pulmonary Rehab    Staff Present Birdie Sons, MPA, Mauricia Area, BS, ACSM CEP, Exercise Physiologist;Joseph Tessie Fass, [place]    Virtual Visit No    Medication changes reported     No    Fall or balance concerns reported    No    Tobacco Cessation No Change    Warm-up and Cool-down Performed on first and last piece of equipment    Resistance Training Performed Yes    VAD Patient? No    PAD/SET Patient? No      Pain Assessment   Currently in Pain? No/denies                Social History   Tobacco Use  Smoking Status Former   Packs/day: 1.00   Years: 8.00   Pack years: 8.00   Types: Cigarettes   Quit date: 09/12/1968   Years since quitting: 53.2  Smokeless Tobacco Never  Tobacco Comments   quit smoking cigarettes > 40 years ago    Goals Met:  Independence with exercise equipment Exercise tolerated well No report of concerns or symptoms today Strength training completed today  Goals Unmet:  Not Applicable  Comments: Pt able to follow exercise prescription today without complaint.  Will continue to monitor for progression. Reviewed home exercise with pt today.  Pt plans to use home equipment for exercise.  Reviewed THR, pulse, RPE, sign and symptoms, pulse oximetery and when to call 911 or MD.  Also discussed weather considerations and indoor options.  Pt voiced understanding.    Dr. Emily Filbert is Medical Director for [place].  Dr.  Ottie Glazier is Medical Director for [facility] Pulmonary Rehabilitation.

## 2021-11-24 ENCOUNTER — Telehealth: Payer: Self-pay | Admitting: Physician Assistant

## 2021-11-24 NOTE — Telephone Encounter (Signed)
Patient returned call for lab results.  

## 2021-11-24 NOTE — Telephone Encounter (Signed)
Charlie Pitter, PA-C  11/05/2021 10:12 AM EST     Pls let pt know labs stable. Mild kidney insufficiency is stable. Hemoglobin still with mild anemia but improving.  The patient has been notified of the result and verbalized understanding.  All questions (if any) were answered. Antonieta Iba, RN 11/24/2021 9:34 AM

## 2021-11-30 ENCOUNTER — Other Ambulatory Visit: Payer: Self-pay

## 2021-11-30 ENCOUNTER — Ambulatory Visit: Payer: Medicare Other

## 2021-11-30 ENCOUNTER — Encounter: Payer: Medicare Other | Admitting: *Deleted

## 2021-11-30 DIAGNOSIS — R5383 Other fatigue: Secondary | ICD-10-CM | POA: Diagnosis not present

## 2021-11-30 DIAGNOSIS — J439 Emphysema, unspecified: Secondary | ICD-10-CM | POA: Diagnosis not present

## 2021-11-30 DIAGNOSIS — U099 Post covid-19 condition, unspecified: Secondary | ICD-10-CM | POA: Diagnosis not present

## 2021-11-30 NOTE — Progress Notes (Signed)
Daily Session Note  Patient Details  Name: Todd Armstrong MRN: [id number] Date of Birth: [birth year] Referring Provider:   Flowsheet Row Pulmonary Rehab from 10/27/2021 in [facility] Cardiac and Pulmonary Rehab  Referring Provider Lavone Orn MD       Encounter Date: 11/30/2021  Check In:  Session Check In - 11/30/21 0926       Check-In   Supervising physician immediately available to respond to emergencies See telemetry face sheet for immediately available ER MD    Location [facility]-Cardiac & Pulmonary Rehab    Staff Present Heath Lark, RN, BSN, Laveda Norman, BS, ACSM CEP, Exercise Physiologist;Joseph [place], [place]    Virtual Visit No    Medication changes reported     No    Fall or balance concerns reported    No    Warm-up and Cool-down Performed on first and last piece of equipment    Resistance Training Performed Yes    VAD Patient? No    PAD/SET Patient? No      Pain Assessment   Currently in Pain? No/denies                Social History   Tobacco Use  Smoking Status Former   Packs/day: 1.00   Years: 8.00   Pack years: 8.00   Types: Cigarettes   Quit date: 09/12/1968   Years since quitting: 53.2  Smokeless Tobacco Never  Tobacco Comments   quit smoking cigarettes > 40 years ago    Goals Met:  Proper associated with RPD/PD & O2 Sat Independence with exercise equipment Exercise tolerated well No report of concerns or symptoms today  Goals Unmet:  Not Applicable  Comments: Pt able to follow exercise prescription today without complaint.  Will continue to monitor for progression.    Dr. Emily Filbert is Medical Director for [place].  Dr. Ottie Glazier is Medical Director for [facility] Pulmonary Rehabilitation.

## 2021-12-02 DIAGNOSIS — R5383 Other fatigue: Secondary | ICD-10-CM | POA: Diagnosis not present

## 2021-12-02 DIAGNOSIS — J439 Emphysema, unspecified: Secondary | ICD-10-CM | POA: Diagnosis not present

## 2021-12-02 DIAGNOSIS — U099 Post covid-19 condition, unspecified: Secondary | ICD-10-CM | POA: Diagnosis not present

## 2021-12-02 NOTE — Progress Notes (Signed)
Daily Session Note  Patient Details  Name: Todd Armstrong MRN: [id number] Date of Birth: March 08, 1931 Referring Provider:   Flowsheet Row Pulmonary Rehab from 10/27/2021 in [facility] Cardiac and Pulmonary Rehab  Referring Provider Lavone Orn MD       Encounter Date: 12/02/2021  Check In:  Session Check In - 12/02/21 0912       Check-In   Supervising physician immediately available to respond to emergencies See telemetry face sheet for immediately available ER MD    Location [facility]-Cardiac & Pulmonary Rehab    Staff Present Birdie Sons, MPA, Elveria Rising, BA, ACSM CEP, Exercise Physiologist;Joseph Tessie Fass, [place]    Virtual Visit No    Medication changes reported     No    Fall or balance concerns reported    No    Tobacco Cessation No Change    Warm-up and Cool-down Performed on first and last piece of equipment    Resistance Training Performed Yes    VAD Patient? No    PAD/SET Patient? No      Pain Assessment   Currently in Pain? No/denies                Social History   Tobacco Use  Smoking Status Former   Packs/day: 1.00   Years: 8.00   Pack years: 8.00   Types: Cigarettes   Quit date: 09/12/1968   Years since quitting: 53.2  Smokeless Tobacco Never  Tobacco Comments   quit smoking cigarettes > 40 years ago    Goals Met:  Independence with exercise equipment Exercise tolerated well No report of concerns or symptoms today Strength training completed today  Goals Unmet:  Not Applicable  Comments: Pt able to follow exercise prescription today without complaint.  Will continue to monitor for progression.    Dr. Emily Filbert is Medical Director for [place].  Dr. Ottie Glazier is Medical Director for [facility] Pulmonary Rehabilitation.

## 2021-12-02 NOTE — Progress Notes (Signed)
Exercise update included

## 2021-12-04 ENCOUNTER — Other Ambulatory Visit: Payer: Self-pay

## 2021-12-04 ENCOUNTER — Encounter: Payer: Medicare Other | Admitting: *Deleted

## 2021-12-04 DIAGNOSIS — J439 Emphysema, unspecified: Secondary | ICD-10-CM

## 2021-12-04 DIAGNOSIS — R5383 Other fatigue: Secondary | ICD-10-CM | POA: Diagnosis not present

## 2021-12-04 DIAGNOSIS — U099 Post covid-19 condition, unspecified: Secondary | ICD-10-CM | POA: Diagnosis not present

## 2021-12-04 NOTE — Progress Notes (Signed)
Daily Session Note  Patient Details  Name: EZRA DENNE MRN: [id number] Date of Birth: 1931-01-31 Referring Provider:   Flowsheet Row Pulmonary Rehab from 10/27/2021 in [facility] Cardiac and Pulmonary Rehab  Referring Provider Lavone Orn MD       Encounter Date: 12/04/2021  Check In:  Session Check In - 12/04/21 0920       Check-In   Supervising physician immediately available to respond to emergencies See telemetry face sheet for immediately available ER MD    Location [facility]-Cardiac & Pulmonary Rehab    Staff Present Renita Papa, RN BSN;Joseph Robinette, RCP,RRT,BSRT;Jessica [place], [place], RCEP, CCRP, CCET    Virtual Visit No    Medication changes reported     No    Fall or balance concerns reported    No    Warm-up and Cool-down Performed on first and last piece of equipment    Resistance Training Performed Yes    VAD Patient? No    PAD/SET Patient? No      Pain Assessment   Currently in Pain? No/denies                Social History   Tobacco Use  Smoking Status Former   Packs/day: 1.00   Years: 8.00   Pack years: 8.00   Types: Cigarettes   Quit date: 09/12/1968   Years since quitting: 53.2  Smokeless Tobacco Never  Tobacco Comments   quit smoking cigarettes > 40 years ago    Goals Met:  Independence with exercise equipment Exercise tolerated well No report of concerns or symptoms today Strength training completed today  Goals Unmet:  Not Applicable  Comments: Pt able to follow exercise prescription today without complaint.  Will continue to monitor for progression.    Dr. Emily Filbert is Medical Director for [place].  Dr. Ottie Glazier is Medical Director for [facility] Pulmonary Rehabilitation.

## 2021-12-08 ENCOUNTER — Other Ambulatory Visit: Payer: Self-pay

## 2021-12-08 ENCOUNTER — Encounter: Payer: Medicare Other | Admitting: *Deleted

## 2021-12-08 DIAGNOSIS — J439 Emphysema, unspecified: Secondary | ICD-10-CM | POA: Diagnosis not present

## 2021-12-08 DIAGNOSIS — R5383 Other fatigue: Secondary | ICD-10-CM | POA: Diagnosis not present

## 2021-12-08 DIAGNOSIS — U099 Post covid-19 condition, unspecified: Secondary | ICD-10-CM | POA: Diagnosis not present

## 2021-12-08 NOTE — Progress Notes (Signed)
Daily Session Note  Patient Details  Name: Todd Armstrong MRN: [id number] Date of Birth: 08/28/1931 Referring Provider:   Flowsheet Row Pulmonary Rehab from 10/27/2021 in [facility] Cardiac and Pulmonary Rehab  Referring Provider Lavone Orn MD       Encounter Date: 12/08/2021  Check In:  Session Check In - 12/08/21 1007       Check-In   Supervising physician immediately available to respond to emergencies See telemetry face sheet for immediately available ER MD    Location [facility]-Cardiac [facility] Pulmonary Rehab    Staff Present Heath Lark, RN, BSN, CCRP;Laureen Owens Shark, BS, RRT, CPFT;Amanda Oletta Darter, BA, ACSM CEP, Exercise Physiologist    Virtual Visit No    Medication changes reported     No    Fall or balance concerns reported    No    Warm-up and Cool-down Performed on first and last piece of equipment    Resistance Training Performed Yes    VAD Patient? No    PAD/SET Patient? No      Pain Assessment   Currently in Pain? No/denies                Social History   Tobacco Use  Smoking Status Former   Packs/day: 1.00   Years: 8.00   Pack years: 8.00   Types: Cigarettes   Quit date: 09/12/1968   Years since quitting: 53.2  Smokeless Tobacco Never  Tobacco Comments   quit smoking cigarettes > 40 years ago    Goals Met:  Proper associated with RPD/PD & O2 Sat Independence with exercise equipment Exercise tolerated well No report of concerns or symptoms today  Goals Unmet:  Not Applicable  Comments: Pt able to follow exercise prescription today without complaint.  Will continue to monitor for progression.    Dr. Emily Filbert is Medical Director for [place].  Dr. Ottie Glazier is Medical Director for [facility] Pulmonary Rehabilitation.

## 2021-12-09 ENCOUNTER — Encounter: Payer: Medicare Other | Admitting: *Deleted

## 2021-12-09 ENCOUNTER — Encounter: Payer: Self-pay | Admitting: *Deleted

## 2021-12-09 DIAGNOSIS — R5383 Other fatigue: Secondary | ICD-10-CM | POA: Diagnosis not present

## 2021-12-09 DIAGNOSIS — J439 Emphysema, unspecified: Secondary | ICD-10-CM

## 2021-12-09 DIAGNOSIS — U099 Post covid-19 condition, unspecified: Secondary | ICD-10-CM | POA: Diagnosis not present

## 2021-12-09 NOTE — Progress Notes (Signed)
Daily Session Note  Patient Details  Name: Todd Armstrong MRN: [id number] Date of Birth: Jan 04, 1931 Referring Provider:   Flowsheet Row Pulmonary Rehab from 10/27/2021 in [facility] Cardiac and Pulmonary Rehab  Referring Provider Lavone Orn MD       Encounter Date: 12/09/2021  Check In:  Session Check In - 12/09/21 0930       Check-In   Supervising physician immediately available to respond to emergencies See telemetry face sheet for immediately available ER MD    Location [facility]-Cardiac & Pulmonary Rehab    Staff Present Heath Lark, RN, BSN, CCRP;Joseph Gibson, RCP,RRT,BSRT;Melissa [place], [place], LDN    Virtual Visit No    Medication changes reported     No    Fall or balance concerns reported    No    Warm-up and Cool-down Performed on first and last piece of equipment    Resistance Training Performed Yes    VAD Patient? No    PAD/SET Patient? No      Pain Assessment   Currently in Pain? No/denies                Social History   Tobacco Use  Smoking Status Former   Packs/day: 1.00   Years: 8.00   Pack years: 8.00   Types: Cigarettes   Quit date: 09/12/1968   Years since quitting: 53.2  Smokeless Tobacco Never  Tobacco Comments   quit smoking cigarettes > 40 years ago    Goals Met:  Proper associated with RPD/PD & O2 Sat Independence with exercise equipment Exercise tolerated well No report of concerns or symptoms today  Goals Unmet:  Not Applicable  Comments: Pt able to follow exercise prescription today without complaint.  Will continue to monitor for progression.    Dr. Emily Filbert is Medical Director for [place].  Dr. Ottie Glazier is Medical Director for [facility] Pulmonary Rehabilitation.

## 2021-12-09 NOTE — Progress Notes (Signed)
Pulmonary Individual Treatment Plan  Patient Details  Name: PENNY FRISBIE MRN: [id number] Date of Birth: 09-13-1931 Referring Provider:   Flowsheet Row Pulmonary Rehab from 10/27/2021 in [facility] Cardiac and Pulmonary Rehab  Referring Provider Lavone Orn MD       Initial Encounter Date:  Flowsheet Row Pulmonary Rehab from 10/27/2021 in [facility] Cardiac and Pulmonary Rehab  Date 10/27/21       Visit Diagnosis: Pulmonary emphysema, unspecified emphysema type ([place])  Patient's Home Medications on Admission:  Current Outpatient Medications:    Accu-Chek FastClix Lancets MISC, For use when checking blood sugars, Disp: , Rfl:    albuterol (VENTOLIN HFA) 108 (90 Base) MCG/ACT inhaler, Inhale 2 puffs into the lungs every 6 (six) hours as needed for wheezing or shortness of breath., Disp: 8 g, Rfl: 6   apixaban (ELIQUIS) 5 MG TABS tablet, TAKE 1 TABLET BY MOUTH  TWICE DAILY, Disp: 180 tablet, Rfl: 1   clopidogrel (PLAVIX) 75 MG tablet, TAKE 1 TABLET BY MOUTH  DAILY, Disp: 90 tablet, Rfl: 3   metFORMIN (GLUCOPHAGE) 1000 MG tablet, Take 1,000 mg by mouth 2 (two) times daily with a meal., Disp: , Rfl:    metoprolol tartrate (LOPRESSOR) 25 MG tablet, Take 0.5 tablets (12.5 mg total) by mouth 2 (two) times daily., Disp: 30 tablet, Rfl: 0   nitroGLYCERIN (NITROSTAT) 0.4 MG SL tablet, Place 1 tablet (0.4 mg total) under the tongue every 5 (five) minutes as needed for chest pain., Disp: 25 tablet, Rfl: 6   potassium chloride (KLOR-CON) 10 MEQ tablet, Take 1 tablet (10 mEq total) by mouth 2 (two) times daily., Disp: 60 tablet, Rfl: 0   rosuvastatin (CRESTOR) 10 MG tablet, TAKE 1 TABLET BY MOUTH  DAILY, Disp: 90 tablet, Rfl: 2   tamsulosin (FLOMAX) 0.4 MG CAPS capsule, Take 0.4 mg by mouth at bedtime. , Disp: , Rfl:    Tiotropium Bromide Monohydrate (SPIRIVA RESPIMAT) 2.5 MCG/ACT AERS, Inhale 2 puffs into the lungs daily., Disp: 4 g, Rfl: 5   torsemide (DEMADEX) 20 MG tablet, Take 1 tablet (20 mg total)  by mouth daily., Disp: 30 tablet, Rfl: 0   triamcinolone cream (KENALOG) 0.1 %, Apply 1 application topically as needed (for itching)., Disp: , Rfl:   Past Medical History: Past Medical History:  Diagnosis Date   Acute myocardial infarction    Anemia    Aortic insufficiency    Carotid stenosis    Chronic combined systolic and diastolic CHF (congestive heart failure) (HCC)    Coronary artery disease    PTCA and stenting of his right coronary artery. 3.5 x 16-mm Liberte stent.               Diabetes mellitus without complication ([place])    Dyslipidemia    Emphysema (subcutaneous) (surgical) resulting from a procedure    Hyperlipemia    Hypertension    Mitral regurgitation    Partial tear of rotator cuff    right shoulder   Peripheral vascular disease (HCC)    Persistent atrial fibrillation ([place])    Pulmonary hypertension ([place])    Stroke ([place])    Wears dentures     Tobacco Use: Social History   Tobacco Use  Smoking Status Former   Packs/day: 1.00   Years: 8.00   Pack years: 8.00   Types: Cigarettes   Quit date: 09/12/1968   Years since quitting: 53.2  Smokeless Tobacco Never  Tobacco Comments   quit smoking cigarettes > 40 years ago  Labs: Recent Review Flowsheet Data     Labs for ITP Cardiac and Pulmonary Rehab Latest Ref Rng & Units 08/16/2018 02/19/2019 04/22/2020 06/03/2021 09/28/2021   Cholestrol 100 - 199 mg/dL 125 132 148 131 -   LDLCALC 0 - 99 mg/dL 38 37 52 38 -   HDL >39 mg/dL 66 76 75 79 -   Trlycerides 0 - 149 mg/dL 103 96 119 72 -   Hemoglobin A1c 4.8 - 5.6 % - - - - 6.3(H)        Pulmonary Assessment Scores:  Pulmonary Assessment Scores     Row Name 10/27/21 1156         ADL UCSD   ADL Phase Entry     SOB Score total 46     Rest 0     Walk 4     Stairs 5     Bath 0     Dress 1     Shop 5       CAT Score   CAT Score 8       mMRC Score   mMRC Score 2              UCSD: Self-administered rating of dyspnea associated with  activities of daily living (ADLs) 6-point scale (0 = "not at all" to 5 = "maximal or unable to do because of breathlessness")  Scoring Scores range from 0 to 120.  Minimally important difference is 5 units  CAT: CAT can identify the health impairment of COPD patients and is better correlated with disease progression.  CAT has a scoring range of zero to 40. The CAT score is classified into four groups of low (less than 10), medium (10 - 20), high (21-30) and very high (31-40) based on the impact level of disease on health status. A CAT score over 10 suggests significant symptoms.  A worsening CAT score could be explained by an exacerbation, poor medication adherence, poor inhaler technique, or progression of COPD or comorbid conditions.  CAT MCID is 2 points  mMRC: mMRC (Modified Medical Research Council) Dyspnea Scale is used to assess the degree of baseline functional disability in patients of respiratory disease due to dyspnea. No minimal important difference is established. A decrease in score of 1 point or greater is considered a positive change.   Pulmonary Function Assessment:  Pulmonary Function Assessment - 10/26/21 1545       Breath   Shortness of Breath Yes;Limiting activity             Exercise Target Goals: Exercise Program Goal: Individual exercise prescription set using results from initial 6 min walk test and THRR while considering  patients activity barriers and safety.   Exercise Prescription Goal: Initial exercise prescription builds to 30-45 minutes a day of aerobic activity, 2-3 days per week.  Home exercise guidelines will be given to patient during program as part of exercise prescription that the participant will acknowledge.  Education: Aerobic Exercise: - Group verbal and visual presentation on the components of exercise prescription. Introduces F.I.T.T principle from ACSM for exercise prescriptions.  Reviews F.I.T.T. principles of aerobic exercise  including progression. Written material given at graduation.   Education: Resistance Exercise: - Group verbal and visual presentation on the components of exercise prescription. Introduces F.I.T.T principle from ACSM for exercise prescriptions  Reviews F.I.T.T. principles of resistance exercise including progression. Written material given at graduation.    Education: Exercise & Equipment Safety: - Individual verbal instruction and demonstration of equipment use  and safety with use of the equipment. Flowsheet Row Pulmonary Rehab from 10/26/2021 in [facility] Cardiac and Pulmonary Rehab  Date 10/26/21  Educator [facility]  Instruction Review Code 1- Verbalizes Understanding       Education: Exercise Physiology & General Exercise Guidelines: - Group verbal and written instruction with models to review the exercise physiology of the cardiovascular system and associated critical values. Provides general exercise guidelines with specific guidelines to those with heart or lung disease.  Flowsheet Row Pulmonary Rehab from 11/18/2021 in [facility] Cardiac and Pulmonary Rehab  Education need identified 10/27/21       Education: Flexibility, Balance, Mind/Body Relaxation: - Group verbal and visual presentation with interactive activity on the components of exercise prescription. Introduces F.I.T.T principle from ACSM for exercise prescriptions. Reviews F.I.T.T. principles of flexibility and balance exercise training including progression. Also discusses the mind body connection.  Reviews various relaxation techniques to help reduce and manage stress (i.e. Deep breathing, progressive muscle relaxation, and visualization). Balance handout provided to take home. Written material given at graduation.   Activity Barriers & Risk Stratification:  Activity Barriers & Cardiac Risk Stratification - 10/27/21 1454       Activity Barriers & Cardiac Risk Stratification   Activity Barriers Assistive Device;Shortness of  Breath;Deconditioning;Muscular Weakness;Other (comment)    Comments Rod in right leg             6 Minute Walk:  6 Minute Walk     Row Name 10/27/21 1455         6 Minute Walk   Phase Initial     Distance 955 feet     Walk Time 6 minutes     MPH 1.8     METS 1.82     RPE 13     Perceived Dyspnea  0     VO2 Peak 6.4     Symptoms No     Resting HR 87 bpm     Resting BP 138/64     Resting Oxygen Saturation  98 %     Exercise Oxygen Saturation  during 6 min walk 97 %     Max Ex. HR 109 bpm     Max Ex. BP 144/66     2 Minute Post BP 122/64       Interval HR   1 Minute HR 96     2 Minute HR 98     3 Minute HR 109     4 Minute HR 106     5 Minute HR 99     6 Minute HR 103     2 Minute Post HR 89     Interval Heart Rate? Yes       Interval Oxygen   Interval Oxygen? Yes     Baseline Oxygen Saturation % 98 %     1 Minute Oxygen Saturation % 96 %     1 Minute Liters of Oxygen 0 L  RA     2 Minute Oxygen Saturation % 98 %     2 Minute Liters of Oxygen 0 L     3 Minute Oxygen Saturation % 98 %     3 Minute Liters of Oxygen 0 L     4 Minute Oxygen Saturation % 98 %     4 Minute Liters of Oxygen 0 L     5 Minute Oxygen Saturation % 98 %     5 Minute Liters of Oxygen 0 L     6 Minute  Oxygen Saturation % 98 %     6 Minute Liters of Oxygen 0 L     2 Minute Post Oxygen Saturation % 97 %     2 Minute Post Liters of Oxygen 0 L             Oxygen Initial Assessment:  Oxygen Initial Assessment - 10/27/21 1156       Home Oxygen   Home Oxygen Device None    Sleep Oxygen Prescription None    Home Exercise Oxygen Prescription None    Home Resting Oxygen Prescription None    Compliance with Home Oxygen Use Yes      Initial 6 min Walk   Oxygen Used None      Program Oxygen Prescription   Program Oxygen Prescription None      Intervention   Short Term Goals To learn and exhibit compliance with exercise, home and travel O2 prescription;To learn and understand  importance of monitoring SPO2 with pulse oximeter and demonstrate accurate use of the pulse oximeter.;To learn and demonstrate proper pursed lip breathing techniques or other breathing techniques. ;To learn and understand importance of maintaining oxygen saturations>88%;To learn and demonstrate proper use of respiratory medications    Long  Term Goals Exhibits compliance with exercise, home  and travel O2 prescription;Verbalizes importance of monitoring SPO2 with pulse oximeter and return demonstration;Maintenance of O2 saturations>88%;Exhibits proper breathing techniques, such as pursed lip breathing or other method taught during program session;Compliance with respiratory medication;Demonstrates proper use of MDIs             Oxygen Re-Evaluation:  Oxygen Re-Evaluation     Row Name 10/29/21 1000 11/23/21 0958           Program Oxygen Prescription   Program Oxygen Prescription None None        Home Oxygen   Home Oxygen Device None None      Sleep Oxygen Prescription None None      Home Exercise Oxygen Prescription None None      Home Resting Oxygen Prescription None None      Compliance with Home Oxygen Use Yes Yes        Goals/Expected Outcomes   Short Term Goals To learn and exhibit compliance with exercise, home and travel O2 prescription;To learn and understand importance of monitoring SPO2 with pulse oximeter and demonstrate accurate use of the pulse oximeter.;To learn and demonstrate proper pursed lip breathing techniques or other breathing techniques. ;To learn and understand importance of maintaining oxygen saturations>88% To learn and exhibit compliance with exercise, home and travel O2 prescription;To learn and understand importance of monitoring SPO2 with pulse oximeter and demonstrate accurate use of the pulse oximeter.;To learn and demonstrate proper pursed lip breathing techniques or other breathing techniques. ;To learn and understand importance of maintaining oxygen  saturations>88%      Long  Term Goals Exhibits compliance with exercise, home  and travel O2 prescription;Verbalizes importance of monitoring SPO2 with pulse oximeter and return demonstration;Maintenance of O2 saturations>88%;Exhibits proper breathing techniques, such as pursed lip breathing or other method taught during program session;Compliance with respiratory medication Exhibits compliance with exercise, home  and travel O2 prescription;Verbalizes importance of monitoring SPO2 with pulse oximeter and return demonstration;Maintenance of O2 saturations>88%;Exhibits proper breathing techniques, such as pursed lip breathing or other method taught during program session;Compliance with respiratory medication      Comments Reviewed PLB technique with pt.  Talked about how it works and it's importance in maintaining their exercise saturations. Wilber Oliphant  monitors sats at home and they are always baove 88%      Goals/Expected Outcomes Short: Become more profiecient at using PLB.   Long: Become independent at using PLB. ST/LT: maintain oxygen above 88% at all times/use PLB as needed               Oxygen Discharge (Final Oxygen Re-Evaluation):  Oxygen Re-Evaluation - 11/23/21 0958       Program Oxygen Prescription   Program Oxygen Prescription None      Home Oxygen   Home Oxygen Device None    Sleep Oxygen Prescription None    Home Exercise Oxygen Prescription None    Home Resting Oxygen Prescription None    Compliance with Home Oxygen Use Yes      Goals/Expected Outcomes   Short Term Goals To learn and exhibit compliance with exercise, home and travel O2 prescription;To learn and understand importance of monitoring SPO2 with pulse oximeter and demonstrate accurate use of the pulse oximeter.;To learn and demonstrate proper pursed lip breathing techniques or other breathing techniques. ;To learn and understand importance of maintaining oxygen saturations>88%    Long  Term Goals Exhibits compliance  with exercise, home  and travel O2 prescription;Verbalizes importance of monitoring SPO2 with pulse oximeter and return demonstration;Maintenance of O2 saturations>88%;Exhibits proper breathing techniques, such as pursed lip breathing or other method taught during program session;Compliance with respiratory medication    Comments Erastus monitors sats at home and they are always baove 88%    Goals/Expected Outcomes ST/LT: maintain oxygen above 88% at all times/use PLB as needed             Initial Exercise Prescription:  Initial Exercise Prescription - 10/27/21 1400       Date of Initial Exercise RX and Referring Provider   Date 10/27/21    Referring Provider Lavone Orn MD      Oxygen   Maintain Oxygen Saturation 88% or higher      Recumbant Bike   Level 1    RPM 60    Watts 20    Minutes 15    METs 1.8      NuStep   Level 1    SPM 80    Minutes 15    METs 1.8      Track   Laps 14    Minutes 15    METs 1.76      Prescription Details   Frequency (times per week) 2    Duration Progress to 30 minutes of continuous aerobic without signs/symptoms of physical distress      Intensity   THRR 40-80% of Max Heartrate 105-123    Ratings of Perceived Exertion 11-13    Perceived Dyspnea 0-4      Progression   Progression Continue to progress workloads to maintain intensity without signs/symptoms of physical distress.      Resistance Training   Training Prescription Yes    Weight 3 lb    Reps 10-15             Perform Capillary Blood Glucose checks as needed.  Exercise Prescription Changes:   Exercise Prescription Changes     Row Name 10/27/21 1400 11/04/21 0900 11/16/21 1200 12/01/21 1200       Response to Exercise   Blood Pressure (Admit) 138/64 112/60 104/58 128/62    Blood Pressure (Exercise) 144/66 144/72 122/70 144/60    Blood Pressure (Exit) 122/64 118/62 108/60 118/62    Heart Rate (Admit) 87 bpm 65  bpm 71 bpm 79 bpm    Heart Rate (Exercise)  109 bpm 112 bpm 104 bpm 110 bpm    Heart Rate (Exit) 89 bpm 102 bpm 100 bpm 90 bpm    Oxygen Saturation (Admit) 98 % 97 % 90 % 95 %    Oxygen Saturation (Exercise) 97 % 92 % 90 % 94 %    Oxygen Saturation (Exit) 97 % 98 % 97 % 98 %    Rating of Perceived Exertion (Exercise) 13 13 12 9     Perceived Dyspnea (Exercise) 0 1 1 0    Symptoms none none none none    Comments walk test results third session -- --    Duration -- Continue with 30 min of aerobic exercise without signs/symptoms of physical distress. Continue with 30 min of aerobic exercise without signs/symptoms of physical distress. Continue with 30 min of aerobic exercise without signs/symptoms of physical distress.    Intensity -- THRR unchanged THRR unchanged THRR unchanged      Progression   Progression -- Continue to progress workloads to maintain intensity without signs/symptoms of physical distress. Continue to progress workloads to maintain intensity without signs/symptoms of physical distress. Continue to progress workloads to maintain intensity without signs/symptoms of physical distress.    Average METs -- 1.4 3 2.75      Resistance Training   Training Prescription -- Yes Yes Yes    Weight -- 3 lb 3 lb 3 lb    Reps -- 10-15 10-15 10-15      Interval Training   Interval Training -- -- No No      Arm Ergometer   Level -- 1 1 --    Minutes -- 15 15 --    METs -- 1 -- --      Biostep-RELP   Level -- -- 2 3    Minutes -- -- 15 15    METs -- -- 3 3      Track   Laps -- 14 38 27    Minutes -- 15 15 15     METs -- 1.76 3.07 2.47      Oxygen   Maintain Oxygen Saturation -- -- 88% or higher 88% or higher             Exercise Comments:   Exercise Comments     Row Name 10/29/21 8676           Exercise Comments First full day of exercise!  Patient was oriented to gym and equipment including functions, settings, policies, and procedures.  Patient's individual exercise prescription and treatment plan were  reviewed.  All starting workloads were established based on the results of the 6 minute walk test done at initial orientation visit.  The plan for exercise progression was also introduced and progression will be customized based on patient's performance and goals.                Exercise Goals and Review:   Exercise Goals     Row Name 10/27/21 1501             Exercise Goals   Increase Physical Activity Yes       Intervention Provide advice, education, support and counseling about physical activity/exercise needs.;Develop an individualized exercise prescription for aerobic and resistive training based on initial evaluation findings, risk stratification, comorbidities and participant's personal goals.       Expected Outcomes Short Term: Attend rehab on a regular basis to increase amount of physical activity.;Long  Term: Add in home exercise to make exercise part of routine and to increase amount of physical activity.;Long Term: Exercising regularly at least 3-5 days a week.       Increase Strength and Stamina Yes       Intervention Provide advice, education, support and counseling about physical activity/exercise needs.;Develop an individualized exercise prescription for aerobic and resistive training based on initial evaluation findings, risk stratification, comorbidities and participant's personal goals.       Expected Outcomes Short Term: Increase workloads from initial exercise prescription for resistance, speed, and METs.;Short Term: Perform resistance training exercises routinely during rehab and add in resistance training at home;Long Term: Improve cardiorespiratory fitness, muscular endurance and strength as measured by increased METs and functional capacity (6MWT)       Able to understand and use rate of perceived exertion (RPE) scale Yes       Intervention Provide education and explanation on how to use RPE scale       Expected Outcomes Short Term: Able to use RPE daily in rehab to  express subjective intensity level;Long Term:  Able to use RPE to guide intensity level when exercising independently       Able to understand and use Dyspnea scale Yes       Intervention Provide education and explanation on how to use Dyspnea scale       Expected Outcomes Short Term: Able to use Dyspnea scale daily in rehab to express subjective sense of shortness of breath during exertion;Long Term: Able to use Dyspnea scale to guide intensity level when exercising independently       Knowledge and understanding of Target Heart Rate Range (THRR) Yes       Intervention Provide education and explanation of THRR including how the numbers were predicted and where they are located for reference       Expected Outcomes Short Term: Able to state/look up THRR;Long Term: Able to use THRR to govern intensity when exercising independently;Short Term: Able to use daily as guideline for intensity in rehab       Able to check pulse independently Yes       Intervention Provide education and demonstration on how to check pulse in carotid and radial arteries.;Review the importance of being able to check your own pulse for safety during independent exercise       Expected Outcomes Short Term: Able to explain why pulse checking is important during independent exercise;Long Term: Able to check pulse independently and accurately       Understanding of Exercise Prescription Yes       Intervention Provide education, explanation, and written materials on patient's individual exercise prescription       Expected Outcomes Short Term: Able to explain program exercise prescription;Long Term: Able to explain home exercise prescription to exercise independently                Exercise Goals Re-Evaluation :  Exercise Goals Re-Evaluation     Row Name 10/29/21 3267 11/04/21 0903 11/16/21 1212 11/23/21 0950 12/01/21 1258     Exercise Goal Re-Evaluation   Exercise Goals Review Increase Physical Activity;Able to understand  and use rate of perceived exertion (RPE) scale;Knowledge and understanding of Target Heart Rate Range (THRR);Understanding of Exercise Prescription;Increase Strength and Stamina;Able to understand and use Dyspnea scale;Able to check pulse independently Increase Physical Activity;Increase Strength and Stamina Increase Physical Activity;Increase Strength and Stamina;Understanding of Exercise Prescription Increase Physical Activity;Increase Strength and Stamina Increase Physical Activity;Increase Strength and Stamina  Comments Reviewed RPE and dyspnea scales, THR and program prescription with pt today.  Pt voiced understanding and was given a copy of goals to take home. Ashon is tolerating exercise well so far.  Oxygen has stayed in the 90s and he reaches THR range Pt has been out since 11/04/21 with appointment and being out of town.  They also mentioned that they may need to change his class schedule again.  He is on level 2 for the BioStep and walkin 38 laps. Reviewed home exercise with pt today.  Pt plans to use home equipment for exercise.  Reviewed THR, pulse, RPE, sign and symptoms, pulse oximetery and when to call 911 or MD.  Also discussed weather considerations and indoor options.  Pt voiced understanding. --   Expected Outcomes Short: Use RPE daily to regulate intensity. Long: Follow program prescription in THR. Short: attend consistently Long:  build overall stamina Short: return to regulay attendance Long: Continue to improve stamina Short:monitor HR/O2 when exercising at home Long:maintain exercise independently --    Row Name 12/01/21 1300             Exercise Goal Re-Evaluation   Comments Rohan is tolerating exercise well.  His RPE is 9 and he is not reaching THR range so staff will encourage increasing levels to RPE 11-13 or THR range       Expected Outcomes Short: reach correct RPE/HR Long: improve overall stamina                Discharge Exercise Prescription (Final Exercise  Prescription Changes):  Exercise Prescription Changes - 12/01/21 1200       Response to Exercise   Blood Pressure (Admit) 128/62    Blood Pressure (Exercise) 144/60    Blood Pressure (Exit) 118/62    Heart Rate (Admit) 79 bpm    Heart Rate (Exercise) 110 bpm    Heart Rate (Exit) 90 bpm    Oxygen Saturation (Admit) 95 %    Oxygen Saturation (Exercise) 94 %    Oxygen Saturation (Exit) 98 %    Rating of Perceived Exertion (Exercise) 9    Perceived Dyspnea (Exercise) 0    Symptoms none    Duration Continue with 30 min of aerobic exercise without signs/symptoms of physical distress.    Intensity THRR unchanged      Progression   Progression Continue to progress workloads to maintain intensity without signs/symptoms of physical distress.    Average METs 2.75      Resistance Training   Training Prescription Yes    Weight 3 lb    Reps 10-15      Interval Training   Interval Training No      Biostep-RELP   Level 3    Minutes 15    METs 3      Track   Laps 27    Minutes 15    METs 2.47      Oxygen   Maintain Oxygen Saturation 88% or higher             Nutrition:  Target Goals: Understanding of nutrition guidelines, daily intake of sodium 1500mg , cholesterol 200mg , calories 30% from fat and 7% or less from saturated fats, daily to have 5 or more servings of fruits and vegetables.  Education: All About Nutrition: -Group instruction provided by verbal, written material, interactive activities, discussions, models, and posters to present general guidelines for heart healthy nutrition including fat, fiber, MyPlate, the role of sodium in heart healthy nutrition, utilization  of the nutrition label, and utilization of this knowledge for meal planning. Follow up email sent as well. Written material given at graduation. Flowsheet Row Pulmonary Rehab from 11/18/2021 in [facility] Cardiac and Pulmonary Rehab  Date 10/29/21  Educator [facility]  Instruction Review Code 1- Verbalizes  Understanding       Biometrics:  Pre Biometrics - 10/27/21 1454       Pre Biometrics   Height 5' 6.5" (1.689 m)    Weight 136 lb 9.6 oz (62 kg)    BMI (Calculated) 21.72    Single Leg Stand 4.5 seconds              Nutrition Therapy Plan and Nutrition Goals:  Nutrition Therapy & Goals - 12/02/21 0933       Nutrition Therapy   Diet Heart healthy, low Na, pulmonary MNT    Drug/Food Interactions Statins/Certain Fruits    Protein (specify units) 75g   1.2/kg of body weight - COPD   Fiber 30 grams    Whole Grain Foods 3 servings    Saturated Fats 12 max. grams    Fruits and Vegetables 8 servings/day    Sodium 1.5 grams      Personal Nutrition Goals   Nutrition Goal ST:  add some healthy fats to meals like peanut butter which also has added protein, heart helathy oils like oilve oil to some of his vegetables, boost or ensure in between meals, or some cheese/nuts with his fruit (he enjoys grapes and oranges) LT: meet protien and calorie needs, maintain weight    Comments 87 y.o. M with T2DM, HTN, CAD, CHF, HLD, COPD admitted for pulmonary emphysemia. Relevant medications: metformin, crestor. Per MD note 11/23, pt is maintaining weight ~135-136lbs, weight as of 07/27/21 149lbs. Latest A1C as of 10/22 was 6.4. PYP 63.  He reports his weight has been stable, he reports his energy level is good and has improved since beginning rehab.  He reports his blood sugar has been good and he checks his BG daily ~ 110-125. He feels that his appetite is good. Yesterday he had a bowl of raisin bran with banana, lunch was a cold cut sandwich with lots of vegetables, no dinner. He normally has about two meals per day. Lunch could vary and be brussels sprouts and carrots with salmon or baked pork chop. he has a garden and also freezes and cans his own foods. He reports using no fat with cooking. Drinks: water.  Encourged to add some healthy fats to meals like peanut butter which also has added protein,  heart helathy oils like oilve oil to some of his vegetables, boost or ensure in between meals, or some cheese/nuts with his fruit (he enjoys grapes and oranges). Discussed increased needs with COPD and importance of meeting those needs as well as weight maintenance. Discussed heart healthy nutrition and pulmonary MNT.      Intervention Plan   Intervention Prescribe, educate and counsel regarding individualized specific dietary modifications aiming towards targeted core components such as weight, hypertension, lipid management, diabetes, heart failure and other comorbidities.    Expected Outcomes Short Term Goal: Understand basic principles of dietary content, such as calories, fat, sodium, cholesterol and nutrients.;Short Term Goal: A plan has been developed with personal nutrition goals set during dietitian appointment.;Long Term Goal: Adherence to prescribed nutrition plan.             Nutrition Assessments:  MEDIFICTS Score Key: ?70 Need to make dietary changes  40-70 Heart Healthy Diet ?  40 Therapeutic Level Cholesterol Diet  Flowsheet Row Pulmonary Rehab from 10/27/2021 in [facility] Cardiac and Pulmonary Rehab  Picture Your Plate Total Score on Admission 63      Picture Your Plate Scores: <10 Unhealthy dietary pattern with much room for improvement. 41-50 Dietary pattern unlikely to meet recommendations for good health and room for improvement. 51-60 More healthful dietary pattern, with some room for improvement.  >60 Healthy dietary pattern, although there may be some specific behaviors that could be improved.   Nutrition Goals Re-Evaluation:   Nutrition Goals Discharge (Final Nutrition Goals Re-Evaluation):   Psychosocial: Target Goals: Acknowledge presence or absence of significant depression and/or stress, maximize coping skills, provide positive support system. Participant is able to verbalize types and ability to use techniques and skills needed for reducing stress and  depression.   Education: Stress, Anxiety, and Depression - Group verbal and visual presentation to define topics covered.  Reviews how body is impacted by stress, anxiety, and depression.  Also discusses healthy ways to reduce stress and to treat/manage anxiety and depression.  Written material given at graduation.   Education: Sleep Hygiene -Provides group verbal and written instruction about how sleep can affect your health.  Define sleep hygiene, discuss sleep cycles and impact of sleep habits. Review good sleep hygiene tips.    Initial Review & Psychosocial Screening:  Initial Psych Review & Screening - 10/26/21 [place]? Yes    Comments He can look to his neighbors, family and daughter for support. He has a positive outlook on his health.      Barriers   Psychosocial barriers to participate in program There are no identifiable barriers or psychosocial needs.;The patient should benefit from training in stress management and relaxation.      Screening Interventions   Interventions To provide support and resources with identified psychosocial needs;Encouraged to exercise;Provide feedback about the scores to participant    Expected Outcomes Short Term goal: Utilizing psychosocial counselor, staff and physician to assist with identification of specific Stressors or current issues interfering with healing process. Setting desired goal for each stressor or current issue identified.;Long Term Goal: Stressors or current issues are controlled or eliminated.;Short Term goal: Identification and review with participant of any Quality of Life or Depression concerns found by scoring the questionnaire.;Long Term goal: The participant improves quality of Life and PHQ9 Scores as seen by post scores and/or verbalization of changes             Quality of Life Scores:  Scores of 19 and below usually indicate a poorer quality of life in these areas.  A  difference of  2-3 points is a clinically meaningful difference.  A difference of 2-3 points in the total score of the Quality of Life Index has been associated with significant improvement in overall quality of life, self-image, physical symptoms, and general health in studies assessing change in quality of life.  PHQ-9: Recent Review Flowsheet Data     Depression screen [facility] 2/9 10/27/2021   Decreased Interest 0   Down, Depressed, Hopeless 0   PHQ - 2 Score 0   Altered sleeping 3   Tired, decreased energy 1   Change in appetite 3   Feeling bad or failure about yourself  0   Trouble concentrating 0   Moving slowly or fidgety/restless 0   Suicidal thoughts 0   PHQ-9 Score 7   Difficult doing work/chores Not difficult at  all      Interpretation of Total Score  Total Score Depression Severity:  1-4 = Minimal depression, 5-9 = Mild depression, 10-14 = Moderate depression, 15-19 = Moderately severe depression, 20-27 = Severe depression   Psychosocial Evaluation and Intervention:  Psychosocial Evaluation - 10/26/21 1548       Psychosocial Evaluation & Interventions   Interventions Encouraged to exercise with the program and follow exercise prescription;Relaxation education;Stress management education    Comments He can look to his neighbors, family and daughter for support. He has a positive outlook on his health.    Expected Outcomes Short: Start [facility] to help with mood. Long: Maintain a healthy mental state.    Continue Psychosocial Services  Follow up required by staff             Psychosocial Re-Evaluation:  Psychosocial Re-Evaluation     [place] Name 11/23/21 [phone]             Psychosocial Re-Evaluation   Current issues with None Identified       Comments Zymarion reports no symptoms of anxiety, depression ot stress.       Expected Outcomes Short: notify staff of any changes Long: maintain positive outlook                Psychosocial Discharge (Final  Psychosocial Re-Evaluation):  Psychosocial Re-Evaluation - 11/23/21 0931       Psychosocial Re-Evaluation   Current issues with None Identified    Comments Tyri reports no symptoms of anxiety, depression ot stress.    Expected Outcomes Short: notify staff of any changes Long: maintain positive outlook             Education: Education Goals: Education classes will be provided on a weekly basis, covering required topics. Participant will state understanding/return demonstration of topics presented.  Learning Barriers/Preferences:  Learning Barriers/Preferences - 10/26/21 1545       Learning Barriers/Preferences   Learning Barriers None    Learning Preferences None             General Pulmonary Education Topics:  Infection Prevention: - Provides verbal and written material to individual with discussion of infection control including proper hand washing and proper equipment cleaning during exercise session. Flowsheet Row Pulmonary Rehab from 10/26/2021 in [facility] Cardiac and Pulmonary Rehab  Date 10/26/21  Educator [facility]  Instruction Review Code 1- Verbalizes Understanding       Falls Prevention: - Provides verbal and written material to individual with discussion of falls prevention and safety. Flowsheet Row Pulmonary Rehab from 10/26/2021 in [facility] Cardiac and Pulmonary Rehab  Date 10/26/21  Educator [facility]  Instruction Review Code 1- Verbalizes Understanding       Chronic Lung Disease Review: - Group verbal instruction with posters, models, PowerPoint presentations and videos,  to review new updates, new respiratory medications, new advancements in procedures and treatments. Providing information on websites and "800" numbers for continued self-education. Includes information about supplement oxygen, available portable oxygen systems, continuous and intermittent flow rates, oxygen safety, concentrators, and Medicare reimbursement for oxygen. Explanation of Pulmonary  Drugs, including class, frequency, complications, importance of spacers, rinsing mouth after steroid MDI's, and proper cleaning methods for nebulizers. Review of basic lung anatomy and physiology related to function, structure, and complications of lung disease. Review of risk factors. Discussion about methods for diagnosing sleep apnea and types of masks and machines for OSA. Includes a review of the use of types of environmental controls: home humidity, furnaces, filters, dust mite/pet prevention,  HEPA vacuums. Discussion about weather changes, air quality and the benefits of nasal washing. Instruction on Warning signs, infection symptoms, calling MD promptly, preventive modes, and value of vaccinations. Review of effective airway clearance, coughing and/or vibration techniques. Emphasizing that all should Create an Action Plan. Written material given at graduation. Flowsheet Row Pulmonary Rehab from 11/18/2021 in [facility] Cardiac and Pulmonary Rehab  Education need identified 10/27/21  Date 11/18/21  Educator [facility]  Instruction Review Code 1- Verbalizes Understanding       AED/CPR: - Group verbal and written instruction with the use of models to demonstrate the basic use of the AED with the basic ABC's of resuscitation.    Anatomy and Cardiac Procedures: - Group verbal and visual presentation and models provide information about basic cardiac anatomy and function. Reviews the testing methods done to diagnose heart disease and the outcomes of the test results. Describes the treatment choices: Medical Management, Angioplasty, or Coronary Bypass Surgery for treating various heart conditions including Myocardial Infarction, Angina, Valve Disease, and Cardiac Arrhythmias.  Written material given at graduation.   Medication Safety: - Group verbal and visual instruction to review commonly prescribed medications for heart and lung disease. Reviews the medication, class of the drug, and side effects. Includes  the steps to properly store meds and maintain the prescription regimen.  Written material given at graduation.   Other: -Provides group and verbal instruction on various topics (see comments)   Knowledge Questionnaire Score:  Knowledge Questionnaire Score - 10/27/21 1449       Knowledge Questionnaire Score   Pre Score 12/18: Lung disease, ADL, O2, PLB              Core Components/Risk Factors/Patient Goals at Admission:  Personal Goals and Risk Factors at Admission - 10/27/21 1502       Core Components/Risk Factors/Patient Goals on Admission    Weight Management Yes;Weight Maintenance    Intervention Weight Management: Develop a combined nutrition and exercise program designed to reach desired caloric intake, while maintaining appropriate intake of nutrient and fiber, sodium and fats, and appropriate energy expenditure required for the weight goal.;Weight Management: Provide education and appropriate resources to help participant work on and attain dietary goals.;Weight Management/Obesity: Establish reasonable short term and long term weight goals.    Admit Weight 136 lb (61.7 kg)    Goal Weight: Short Term 136 lb (61.7 kg)    Goal Weight: Long Term 136 lb (61.7 kg)    Expected Outcomes Short Term: Continue to assess and modify interventions until short term weight is achieved;Long Term: Adherence to nutrition and physical activity/exercise program aimed toward attainment of established weight goal;Understanding recommendations for meals to include 15-35% energy as protein, 25-35% energy from fat, 35-60% energy from carbohydrates, less than 200mg  of dietary cholesterol, 20-35 gm of total fiber daily;Understanding of distribution of calorie intake throughout the day with the consumption of 4-5 meals/snacks;Weight Maintenance: Understanding of the daily nutrition guidelines, which includes 25-35% calories from fat, 7% or less cal from saturated fats, less than 200mg  cholesterol, less than  1.5gm of sodium, & 5 or more servings of fruits and vegetables daily    Improve shortness of breath with ADL's Yes    Intervention Provide education, individualized exercise plan and daily activity instruction to help decrease symptoms of SOB with activities of daily living.    Expected Outcomes Short Term: Improve cardiorespiratory fitness to achieve a reduction of symptoms when performing ADLs;Long Term: Be able to perform more ADLs without symptoms or  delay the onset of symptoms    Diabetes Yes    Intervention Provide education about signs/symptoms and action to take for hypo/hyperglycemia.;Provide education about proper nutrition, including hydration, and aerobic/resistive exercise prescription along with prescribed medications to achieve blood glucose in normal ranges: Fasting glucose 65-99 mg/dL    Expected Outcomes Short Term: Participant verbalizes understanding of the signs/symptoms and immediate care of hyper/hypoglycemia, proper foot care and importance of medication, aerobic/resistive exercise and nutrition plan for blood glucose control.;Long Term: Attainment of HbA1C < 7%.    Hypertension Yes    Intervention Provide education on lifestyle modifcations including regular physical activity/exercise, weight management, moderate sodium restriction and increased consumption of fresh fruit, vegetables, and low fat dairy, alcohol moderation, and smoking cessation.;Monitor prescription use compliance.    Expected Outcomes Short Term: Continued assessment and intervention until BP is < 140/32mm HG in hypertensive participants. < 130/21mm HG in hypertensive participants with diabetes, heart failure or chronic kidney disease.;Long Term: Maintenance of blood pressure at goal levels.    Lipids Yes    Intervention Provide education and support for participant on nutrition & aerobic/resistive exercise along with prescribed medications to achieve LDL 70mg , HDL >40mg .    Expected Outcomes Short Term:  Participant states understanding of desired cholesterol values and is compliant with medications prescribed. Participant is following exercise prescription and nutrition guidelines.;Long Term: Cholesterol controlled with medications as prescribed, with individualized exercise RX and with personalized nutrition plan. Value goals: LDL < 70mg , HDL > 40 mg.             Education:Diabetes - Individual verbal and written instruction to review signs/symptoms of diabetes, desired ranges of glucose level fasting, after meals and with exercise. Acknowledge that pre and post exercise glucose checks will be done for 3 sessions at entry of program. Flowsheet Row Pulmonary Rehab from 10/26/2021 in [facility] Cardiac and Pulmonary Rehab  Date 10/26/21  Educator [facility]  Instruction Review Code 1- Verbalizes Understanding       Know Your Numbers and Heart Failure: - Group verbal and visual instruction to discuss disease risk factors for cardiac and pulmonary disease and treatment options.  Reviews associated critical values for Overweight/Obesity, Hypertension, Cholesterol, and Diabetes.  Discusses basics of heart failure: signs/symptoms and treatments.  Introduces Heart Failure Zone chart for action plan for heart failure.  Written material given at graduation.   Core Components/Risk Factors/Patient Goals Review:   Goals and Risk Factor Review     Row Name 11/23/21 0928             Core Components/Risk Factors/Patient Goals Review   Personal Goals Review Diabetes;Improve shortness of breath with ADL's;Weight Management/Obesity       Review Zhamir checks fasting BG every day.  Today it was 117.  His weight stays steady and he weighs every morning.  He says his breathing is better and he is able to do more than he could before.       Expected Outcomes Short:  contnue to monitor weight and BG Long: keep risk factors at recommended levels                Core Components/Risk Factors/Patient Goals at  Discharge (Final Review):   Goals and Risk Factor Review - 11/23/21 0928       Core Components/Risk Factors/Patient Goals Review   Personal Goals Review Diabetes;Improve shortness of breath with ADL's;Weight Management/Obesity    Review Corbet checks fasting BG every day.  Today it was 117.  His weight stays  steady and he weighs every morning.  He says his breathing is better and he is able to do more than he could before.    Expected Outcomes Short:  contnue to monitor weight and BG Long: keep risk factors at recommended levels             ITP Comments:  ITP Comments     Row Name 10/26/21 1543 10/27/21 1151 10/29/21 0952 11/11/21 0704 11/16/21 1212   ITP Comments Virtual Visit completed. Patient informed on EP and RD appointment and 6 Minute walk test. Patient also informed of patient health questionnaires on My Chart. Patient Verbalizes understanding. Visit diagnosis can be found in [facility] Media 08/20/2021. Completed 6MWT and gym orientation. Initial ITP created and sent for review to Dr. Ottie Glazier, Medical Director. First full day of exercise!  Patient was oriented to gym and equipment including functions, settings, policies, and procedures.  Patient's individual exercise prescription and treatment plan were reviewed.  All starting workloads were established based on the results of the 6 minute walk test done at initial orientation visit.  The plan for exercise progression was also introduced and progression will be customized based on patient's performance and goals. 30 Day review completed. Medical Director ITP review done, changes made as directed, and signed approval by Medical Director.   New to program Out since 11/04/21    Row Name 12/09/21 0728           ITP Comments 30 Day review completed. Medical Director ITP review done, changes made as directed, and signed approval by Medical Director.                Comments:

## 2021-12-10 ENCOUNTER — Other Ambulatory Visit: Payer: Self-pay

## 2021-12-10 DIAGNOSIS — J439 Emphysema, unspecified: Secondary | ICD-10-CM | POA: Diagnosis not present

## 2021-12-10 DIAGNOSIS — R5383 Other fatigue: Secondary | ICD-10-CM | POA: Diagnosis not present

## 2021-12-10 DIAGNOSIS — U099 Post covid-19 condition, unspecified: Secondary | ICD-10-CM | POA: Diagnosis not present

## 2021-12-10 NOTE — Progress Notes (Signed)
Daily Session Note  Patient Details  Name: Todd Armstrong MRN: [id number] Date of Birth: 1931-12-12 Referring Provider:   Flowsheet Row Pulmonary Rehab from 10/27/2021 in [facility] Cardiac and Pulmonary Rehab  Referring Provider Lavone Orn MD       Encounter Date: 12/10/2021  Check In:  Session Check In - 12/10/21 1032       Check-In   Supervising physician immediately available to respond to emergencies See telemetry face sheet for immediately available ER MD    Location [facility]-Cardiac & Pulmonary Rehab    Staff Present Will Bonnet, RN,BC,MSN;Melissa [place], RDN, Rowe Pavy, BA, ACSM CEP, Exercise Physiologist    Virtual Visit No    Medication changes reported     No    Fall or balance concerns reported    No    Warm-up and Cool-down Performed on first and last piece of equipment    Resistance Training Performed Yes    VAD Patient? No    PAD/SET Patient? No      Pain Assessment   Currently in Pain? No/denies                Social History   Tobacco Use  Smoking Status Former   Packs/day: 1.00   Years: 8.00   Pack years: 8.00   Types: Cigarettes   Quit date: 09/12/1968   Years since quitting: 53.2  Smokeless Tobacco Never  Tobacco Comments   quit smoking cigarettes > 40 years ago    Goals Met:  Independence with exercise equipment Exercise tolerated well No report of concerns or symptoms today Strength training completed today  Goals Unmet:  Not Applicable  Comments: Pt able to follow exercise prescription today without complaint.  Will continue to monitor for progression.    Dr. Emily Filbert is Medical Director for [place].  Dr. Ottie Glazier is Medical Director for [facility] Pulmonary Rehabilitation.

## 2021-12-15 DIAGNOSIS — H0102A Squamous blepharitis right eye, upper and lower eyelids: Secondary | ICD-10-CM | POA: Diagnosis not present

## 2021-12-15 DIAGNOSIS — E119 Type 2 diabetes mellitus without complications: Secondary | ICD-10-CM | POA: Diagnosis not present

## 2021-12-15 DIAGNOSIS — H2513 Age-related nuclear cataract, bilateral: Secondary | ICD-10-CM | POA: Diagnosis not present

## 2021-12-15 DIAGNOSIS — H0102B Squamous blepharitis left eye, upper and lower eyelids: Secondary | ICD-10-CM | POA: Diagnosis not present

## 2021-12-15 DIAGNOSIS — H04123 Dry eye syndrome of bilateral lacrimal glands: Secondary | ICD-10-CM | POA: Diagnosis not present

## 2021-12-21 ENCOUNTER — Encounter: Payer: Medicare Other | Attending: Internal Medicine | Admitting: *Deleted

## 2021-12-21 ENCOUNTER — Other Ambulatory Visit: Payer: Self-pay

## 2021-12-21 DIAGNOSIS — J439 Emphysema, unspecified: Secondary | ICD-10-CM | POA: Insufficient documentation

## 2021-12-21 DIAGNOSIS — R5383 Other fatigue: Secondary | ICD-10-CM | POA: Diagnosis not present

## 2021-12-21 DIAGNOSIS — B948 Sequelae of other specified infectious and parasitic diseases: Secondary | ICD-10-CM | POA: Insufficient documentation

## 2021-12-21 NOTE — Progress Notes (Signed)
Daily Session Note  Patient Details  Name: Todd Armstrong MRN: [id number] Date of Birth: March 10, 1931 Referring Provider:   Flowsheet Row Pulmonary Rehab from 10/27/2021 in [facility] Cardiac and Pulmonary Rehab  Referring Provider Lavone Orn MD       Encounter Date: 12/21/2021  Check In:  Session Check In - 12/21/21 1020       Check-In   Supervising physician immediately available to respond to emergencies See telemetry face sheet for immediately available ER MD    Location [facility]-Cardiac & Pulmonary Rehab    Staff Present Heath Lark, RN, BSN, CCRP;Joseph Jefferson, RCP,RRT,BSRT;Kelly Canton, BS, ACSM CEP, Exercise Physiologist;Meredith Sherryll Burger, RN BSN    Virtual Visit No    Medication changes reported     No    Fall or balance concerns reported    No    Warm-up and Cool-down Performed on first and last piece of equipment    Resistance Training Performed Yes    VAD Patient? No    PAD/SET Patient? No      Pain Assessment   Currently in Pain? No/denies                Social History   Tobacco Use  Smoking Status Former   Packs/day: 1.00   Years: 8.00   Pack years: 8.00   Types: Cigarettes   Quit date: 09/12/1968   Years since quitting: 53.3  Smokeless Tobacco Never  Tobacco Comments   quit smoking cigarettes > 40 years ago    Goals Met:  Proper associated with RPD/PD & O2 Sat Independence with exercise equipment Exercise tolerated well No report of concerns or symptoms today  Goals Unmet:  Not Applicable  Comments: Pt able to follow exercise prescription today without complaint.  Will continue to monitor for progression.    Dr. Emily Filbert is Medical Director for [place].  Dr. Ottie Glazier is Medical Director for [facility] Pulmonary Rehabilitation.

## 2021-12-23 ENCOUNTER — Other Ambulatory Visit: Payer: Self-pay

## 2021-12-23 DIAGNOSIS — J439 Emphysema, unspecified: Secondary | ICD-10-CM | POA: Diagnosis not present

## 2021-12-23 DIAGNOSIS — B948 Sequelae of other specified infectious and parasitic diseases: Secondary | ICD-10-CM | POA: Diagnosis not present

## 2021-12-23 DIAGNOSIS — R5383 Other fatigue: Secondary | ICD-10-CM | POA: Diagnosis not present

## 2021-12-23 NOTE — Progress Notes (Signed)
Daily Session Note  Patient Details  Name: Todd Armstrong MRN: [id number] Date of Birth: 03/04/1931 Referring Provider:   Flowsheet Row Pulmonary Rehab from 10/27/2021 in [facility] Cardiac and Pulmonary Rehab  Referring Provider Lavone Orn MD       Encounter Date: 12/23/2021  Check In:  Session Check In - 12/23/21 0917       Check-In   Supervising physician immediately available to respond to emergencies See telemetry face sheet for immediately available ER MD    Location [facility]-Cardiac & Pulmonary Rehab    Staff Present Birdie Sons, MPA, RN;Joseph Guttenberg, [place], MA, RCEP, CCRP, CCET    Virtual Visit No    Medication changes reported     No    Fall or balance concerns reported    No    Tobacco Cessation No Change    Warm-up and Cool-down Performed on first and last piece of equipment    Resistance Training Performed Yes    VAD Patient? No    PAD/SET Patient? No      Pain Assessment   Currently in Pain? No/denies                Social History   Tobacco Use  Smoking Status Former   Packs/day: 1.00   Years: 8.00   Pack years: 8.00   Types: Cigarettes   Quit date: 09/12/1968   Years since quitting: 53.3  Smokeless Tobacco Never  Tobacco Comments   quit smoking cigarettes > 40 years ago    Goals Met:  Independence with exercise equipment Exercise tolerated well No report of concerns or symptoms today Strength training completed today  Goals Unmet:  Not Applicable  Comments: Pt able to follow exercise prescription today without complaint.  Will continue to monitor for progression.    Dr. Emily Filbert is Medical Director for [place].  Dr. Ottie Glazier is Medical Director for [facility] Pulmonary Rehabilitation.

## 2021-12-24 DIAGNOSIS — I2510 Atherosclerotic heart disease of native coronary artery without angina pectoris: Secondary | ICD-10-CM | POA: Diagnosis not present

## 2021-12-24 DIAGNOSIS — I10 Essential (primary) hypertension: Secondary | ICD-10-CM | POA: Diagnosis not present

## 2021-12-24 DIAGNOSIS — D6869 Other thrombophilia: Secondary | ICD-10-CM | POA: Diagnosis not present

## 2021-12-24 DIAGNOSIS — E1169 Type 2 diabetes mellitus with other specified complication: Secondary | ICD-10-CM | POA: Diagnosis not present

## 2021-12-24 DIAGNOSIS — Z0000 Encounter for general adult medical examination without abnormal findings: Secondary | ICD-10-CM | POA: Diagnosis not present

## 2021-12-24 DIAGNOSIS — Z23 Encounter for immunization: Secondary | ICD-10-CM | POA: Diagnosis not present

## 2021-12-24 DIAGNOSIS — I482 Chronic atrial fibrillation, unspecified: Secondary | ICD-10-CM | POA: Diagnosis not present

## 2021-12-24 DIAGNOSIS — Z1389 Encounter for screening for other disorder: Secondary | ICD-10-CM | POA: Diagnosis not present

## 2021-12-24 DIAGNOSIS — I5042 Chronic combined systolic (congestive) and diastolic (congestive) heart failure: Secondary | ICD-10-CM | POA: Diagnosis not present

## 2021-12-24 DIAGNOSIS — J439 Emphysema, unspecified: Secondary | ICD-10-CM | POA: Diagnosis not present

## 2021-12-24 DIAGNOSIS — E7800 Pure hypercholesterolemia, unspecified: Secondary | ICD-10-CM | POA: Diagnosis not present

## 2021-12-24 DIAGNOSIS — Z7984 Long term (current) use of oral hypoglycemic drugs: Secondary | ICD-10-CM | POA: Diagnosis not present

## 2021-12-25 ENCOUNTER — Encounter: Payer: Medicare Other | Admitting: *Deleted

## 2021-12-25 ENCOUNTER — Other Ambulatory Visit: Payer: Self-pay

## 2021-12-25 DIAGNOSIS — R5383 Other fatigue: Secondary | ICD-10-CM | POA: Diagnosis not present

## 2021-12-25 DIAGNOSIS — J439 Emphysema, unspecified: Secondary | ICD-10-CM

## 2021-12-25 DIAGNOSIS — B948 Sequelae of other specified infectious and parasitic diseases: Secondary | ICD-10-CM | POA: Diagnosis not present

## 2021-12-25 NOTE — Progress Notes (Signed)
Daily Session Note  Patient Details  Name: MYKA LUKINS MRN: [id number] Date of Birth: 1931-08-14 Referring Provider:   Flowsheet Row Pulmonary Rehab from 10/27/2021 in [facility] Cardiac and Pulmonary Rehab  Referring Provider Lavone Orn MD       Encounter Date: 12/25/2021  Check In:  Session Check In - 12/25/21 0911       Check-In   Supervising physician immediately available to respond to emergencies See telemetry face sheet for immediately available ER MD    Location [facility]Cardiac & Pulmonary Rehab    Staff Present Renita Papa, RN BSN;Joseph [place], RCP,RRT,BSRT;Jessica [place], [place], RCEP, CCRP, CCET    Virtual Visit No    Medication changes reported     No    Fall or balance concerns reported    No    Warm-up and Cool-down Performed on first and last piece of equipment    Resistance Training Performed Yes    VAD Patient? No    PAD/SET Patient? No      Pain Assessment   Currently in Pain? No/denies                Social History   Tobacco Use  Smoking Status Former   Packs/day: 1.00   Years: 8.00   Pack years: 8.00   Types: Cigarettes   Quit date: 09/12/1968   Years since quitting: 53.3  Smokeless Tobacco Never  Tobacco Comments   quit smoking cigarettes > 40 years ago    Goals Met:  Independence with exercise equipment Exercise tolerated well No report of concerns or symptoms today Strength training completed today  Goals Unmet:  Not Applicable  Comments: Pt able to follow exercise prescription today without complaint.  Will continue to monitor for progression.    Dr. Emily Filbert is Medical Director for [place].  Dr. Ottie Glazier is Medical Director for [facility] Pulmonary Rehabilitation.

## 2021-12-28 ENCOUNTER — Encounter: Payer: Medicare Other | Admitting: *Deleted

## 2021-12-28 ENCOUNTER — Other Ambulatory Visit: Payer: Self-pay

## 2021-12-28 DIAGNOSIS — J439 Emphysema, unspecified: Secondary | ICD-10-CM

## 2021-12-28 DIAGNOSIS — R5383 Other fatigue: Secondary | ICD-10-CM | POA: Diagnosis not present

## 2021-12-28 DIAGNOSIS — B948 Sequelae of other specified infectious and parasitic diseases: Secondary | ICD-10-CM | POA: Diagnosis not present

## 2021-12-28 NOTE — Progress Notes (Signed)
Daily Session Note  Patient Details  Name: Todd Armstrong MRN: [id number] Date of Birth: 12/14/1930 Referring Provider:   Flowsheet Row Pulmonary Rehab from 10/27/2021 in [facility] Cardiac and Pulmonary Rehab  Referring Provider Lavone Orn MD       Encounter Date: 12/28/2021  Check In:  Session Check In - 12/28/21 0928       Check-In   Supervising physician immediately available to respond to emergencies See telemetry face sheet for immediately available ER MD    Location [facility]-Cardiac & Pulmonary Rehab    Staff Present Heath Lark, RN, BSN, Laveda [place], BS, ACSM CEP, Exercise Physiologist;Amanda Oletta Darter, [place], ACSM CEP, Exercise Physiologist    Virtual Visit No    Medication changes reported     No    Fall or balance concerns reported    No    Warm-up and Cool-down Performed on first and last piece of equipment    Resistance Training Performed Yes    VAD Patient? No    PAD/SET Patient? No      Pain Assessment   Currently in Pain? No/denies                Social History   Tobacco Use  Smoking Status Former   Packs/day: 1.00   Years: 8.00   Pack years: 8.00   Types: Cigarettes   Quit date: 09/12/1968   Years since quitting: 53.3  Smokeless Tobacco Never  Tobacco Comments   quit smoking cigarettes > 40 years ago    Goals Met:  Proper associated with RPD/PD & O2 Sat Independence with exercise equipment Exercise tolerated well No report of concerns or symptoms today  Goals Unmet:  Not Applicable  Comments: Pt able to follow exercise prescription today without complaint.  Will continue to monitor for progression.    Dr. Emily Filbert is Medical Director for [place].  Dr. Ottie Glazier is Medical Director for [facility] Pulmonary Rehabilitation.

## 2021-12-30 ENCOUNTER — Other Ambulatory Visit: Payer: Self-pay

## 2021-12-30 DIAGNOSIS — R5383 Other fatigue: Secondary | ICD-10-CM | POA: Diagnosis not present

## 2021-12-30 DIAGNOSIS — J439 Emphysema, unspecified: Secondary | ICD-10-CM | POA: Diagnosis not present

## 2021-12-30 DIAGNOSIS — B948 Sequelae of other specified infectious and parasitic diseases: Secondary | ICD-10-CM | POA: Diagnosis not present

## 2021-12-30 NOTE — Progress Notes (Signed)
Daily Session Note  Patient Details  Name: Todd Armstrong MRN: [id number] Date of Birth: [birth year] Referring Provider:   Flowsheet Row Pulmonary Rehab from 10/27/2021 in [facility] Cardiac and Pulmonary Rehab  Referring Provider Lavone Orn MD       Encounter Date: 12/30/2021  Check In:  Session Check In - 12/30/21 0918       Check-In   Supervising physician immediately available to respond to emergencies See telemetry face sheet for immediately available ER MD    Location [facility]-Cardiac & Pulmonary Rehab    Staff Present Birdie Sons, MPA, [place]veria Rising, BA, ACSM CEP, Exercise Physiologist;Joseph Tessie Fass, [place]    Virtual Visit No    Medication changes reported     No    Fall or balance concerns reported    No    Tobacco Cessation No Change    Warm-up and Cool-down Performed on first and last piece of equipment    Resistance Training Performed Yes    VAD Patient? No    PAD/SET Patient? No      Pain Assessment   Currently in Pain? No/denies                Social History   Tobacco Use  Smoking Status Former   Packs/day: 1.00   Years: 8.00   Pack years: 8.00   Types: Cigarettes   Quit date: 09/12/1968   Years since quitting: 53.3  Smokeless Tobacco Never  Tobacco Comments   quit smoking cigarettes > 40 years ago    Goals Met:  Independence with exercise equipment Exercise tolerated well No report of concerns or symptoms today Strength training completed today  Goals Unmet:  Not Applicable  Comments: Pt able to follow exercise prescription today without complaint.  Will continue to monitor for progression.    Dr. Emily Filbert is Medical Director for [place].  Dr. Ottie Glazier is Medical Director for [facility] Pulmonary Rehabilitation.

## 2022-01-01 ENCOUNTER — Encounter: Payer: Medicare Other | Admitting: *Deleted

## 2022-01-01 ENCOUNTER — Other Ambulatory Visit: Payer: Self-pay

## 2022-01-01 DIAGNOSIS — B948 Sequelae of other specified infectious and parasitic diseases: Secondary | ICD-10-CM | POA: Diagnosis not present

## 2022-01-01 DIAGNOSIS — R5383 Other fatigue: Secondary | ICD-10-CM | POA: Diagnosis not present

## 2022-01-01 DIAGNOSIS — J439 Emphysema, unspecified: Secondary | ICD-10-CM

## 2022-01-01 NOTE — Progress Notes (Signed)
Daily Session Note  Patient Details  Name: Todd Armstrong MRN: [id number] Date of Birth: 1931/04/07 Referring Provider:   Flowsheet Row Pulmonary Rehab from 10/27/2021 in [facility] Cardiac and Pulmonary Rehab  Referring Provider Lavone Orn MD       Encounter Date: 01/01/2022  Check In:  Session Check In - 01/01/22 0918       Check-In   Supervising physician immediately available to respond to emergencies See telemetry face sheet for immediately available ER MD    Location [facility]-Cardiac & Pulmonary Rehab    Staff Present Renita Papa, RN BSN;Joseph [place], RCP,RRT,BSRT;Jessica [place], [place], RCEP, CCRP, CCET    Virtual Visit No    Medication changes reported     No    Fall or balance concerns reported    No    Warm-up and Cool-down Performed on first and last piece of equipment    Resistance Training Performed Yes    VAD Patient? No    PAD/SET Patient? No      Pain Assessment   Currently in Pain? No/denies                Social History   Tobacco Use  Smoking Status Former   Packs/day: 1.00   Years: 8.00   Pack years: 8.00   Types: Cigarettes   Quit date: 09/12/1968   Years since quitting: 53.3  Smokeless Tobacco Never  Tobacco Comments   quit smoking cigarettes > 40 years ago    Goals Met:  Independence with exercise equipment Exercise tolerated well No report of concerns or symptoms today Strength training completed today  Goals Unmet:  Not Applicable  Comments: Pt able to follow exercise prescription today without complaint.  Will continue to monitor for progression.    Dr. Emily Filbert is Medical Director for [place].  Dr. Ottie Glazier is Medical Director for [facility] Pulmonary Rehabilitation.

## 2022-01-04 ENCOUNTER — Other Ambulatory Visit: Payer: Self-pay

## 2022-01-04 ENCOUNTER — Encounter: Payer: Medicare Other | Admitting: *Deleted

## 2022-01-04 DIAGNOSIS — J439 Emphysema, unspecified: Secondary | ICD-10-CM

## 2022-01-04 DIAGNOSIS — B948 Sequelae of other specified infectious and parasitic diseases: Secondary | ICD-10-CM | POA: Diagnosis not present

## 2022-01-04 DIAGNOSIS — R5383 Other fatigue: Secondary | ICD-10-CM | POA: Diagnosis not present

## 2022-01-04 NOTE — Progress Notes (Signed)
Daily Session Note  Patient Details  Name: KARANDEEP RESENDE MRN: [id number] Date of Birth: 07-17-1931 Referring Provider:   Flowsheet Row Pulmonary Rehab from 10/27/2021 in [facility] Cardiac and Pulmonary Rehab  Referring Provider Lavone Orn MD       Encounter Date: 01/04/2022  Check In:  Session Check In - 01/04/22 1000       Check-In   Supervising physician immediately available to respond to emergencies See telemetry face sheet for immediately available ER MD    Location [facility]-Cardiac & Pulmonary Rehab    Staff Present Heath Lark, RN, BSN, CCRP;Joseph [place], RCP,RRT,BSRT;Kelly [place], [place], ACSM CEP, Exercise Physiologist    Virtual Visit No    Medication changes reported     No    Fall or balance concerns reported    No    Warm-up and Cool-down Performed on first and last piece of equipment    Resistance Training Performed Yes    VAD Patient? No    PAD/SET Patient? No      Pain Assessment   Currently in Pain? No/denies                Social History   Tobacco Use  Smoking Status Former   Packs/day: 1.00   Years: 8.00   Pack years: 8.00   Types: Cigarettes   Quit date: 09/12/1968   Years since quitting: 53.3  Smokeless Tobacco Never  Tobacco Comments   quit smoking cigarettes > 40 years ago    Goals Met:  Proper associated with RPD/PD & O2 Sat Independence with exercise equipment Exercise tolerated well No report of concerns or symptoms today  Goals Unmet:  Not Applicable  Comments: Pt able to follow exercise prescription today without complaint.  Will continue to monitor for progression.    Dr. Emily Filbert is Medical Director for [place].  Dr. Ottie Glazier is Medical Director for [facility] Pulmonary Rehabilitation.

## 2022-01-05 DIAGNOSIS — E1151 Type 2 diabetes mellitus with diabetic peripheral angiopathy without gangrene: Secondary | ICD-10-CM | POA: Diagnosis not present

## 2022-01-06 ENCOUNTER — Encounter: Payer: Self-pay | Admitting: *Deleted

## 2022-01-06 ENCOUNTER — Other Ambulatory Visit: Payer: Self-pay

## 2022-01-06 DIAGNOSIS — B948 Sequelae of other specified infectious and parasitic diseases: Secondary | ICD-10-CM | POA: Diagnosis not present

## 2022-01-06 DIAGNOSIS — J439 Emphysema, unspecified: Secondary | ICD-10-CM

## 2022-01-06 DIAGNOSIS — R5383 Other fatigue: Secondary | ICD-10-CM | POA: Diagnosis not present

## 2022-01-06 NOTE — Progress Notes (Signed)
Daily Session Note  Patient Details  Name: Todd Armstrong MRN: [id number] Date of Birth: May 23, 1931 Referring Provider:   Flowsheet Row Pulmonary Rehab from 10/27/2021 in [facility] Cardiac and Pulmonary Rehab  Referring Provider Lavone Orn MD       Encounter Date: 01/06/2022  Check In:  Session Check In - 01/06/22 0923       Check-In   Supervising physician immediately available to respond to emergencies See telemetry face sheet for immediately available ER MD    Location [facility]-Cardiac & Pulmonary Rehab    Staff Present Birdie Sons, MPA, RN;Jessica [place], MA, RCEP, CCRP, CCET;Joseph [place], [place]    Virtual Visit No    Medication changes reported     No    Fall or balance concerns reported    No    Tobacco Cessation No Change    Warm-up and Cool-down Performed on first and last piece of equipment    Resistance Training Performed Yes    VAD Patient? No    PAD/SET Patient? No      Pain Assessment   Currently in Pain? No/denies                Social History   Tobacco Use  Smoking Status Former   Packs/day: 1.00   Years: 8.00   Pack years: 8.00   Types: Cigarettes   Quit date: 09/12/1968   Years since quitting: 53.3  Smokeless Tobacco Never  Tobacco Comments   quit smoking cigarettes > 40 years ago    Goals Met:  Independence with exercise equipment Exercise tolerated well No report of concerns or symptoms today Strength training completed today  Goals Unmet:  Not Applicable  Comments: Pt able to follow exercise prescription today without complaint.  Will continue to monitor for progression.    Dr. Emily Filbert is Medical Director for [place].  Dr. Ottie Glazier is Medical Director for [facility] Pulmonary Rehabilitation.

## 2022-01-06 NOTE — Progress Notes (Signed)
Pulmonary Individual Treatment Plan  Patient Details  Name: Todd Armstrong MRN: [id number] Date of Birth: 05/28/1931 Referring Provider:   Flowsheet Row Pulmonary Rehab from 10/27/2021 in [facility] Cardiac and Pulmonary Rehab  Referring Provider Lavone Orn MD       Initial Encounter Date:  Flowsheet Row Pulmonary Rehab from 10/27/2021 in [facility] Cardiac and Pulmonary Rehab  Date 10/27/21       Visit Diagnosis: Pulmonary emphysema, unspecified emphysema type ([place])  Patient's Home Medications on Admission:  Current Outpatient Medications:    Accu-Chek FastClix Lancets MISC, For use when checking blood sugars, Disp: , Rfl:    albuterol (VENTOLIN HFA) 108 (90 Base) MCG/ACT inhaler, Inhale 2 puffs into the lungs every 6 (six) hours as needed for wheezing or shortness of breath., Disp: 8 g, Rfl: 6   apixaban (ELIQUIS) 5 MG TABS tablet, TAKE 1 TABLET BY MOUTH  TWICE DAILY, Disp: 180 tablet, Rfl: 1   clopidogrel (PLAVIX) 75 MG tablet, TAKE 1 TABLET BY MOUTH  DAILY, Disp: 90 tablet, Rfl: 3   metFORMIN (GLUCOPHAGE) 1000 MG tablet, Take 1,000 mg by mouth 2 (two) times daily with a meal., Disp: , Rfl:    metoprolol tartrate (LOPRESSOR) 25 MG tablet, Take 0.5 tablets (12.5 mg total) by mouth 2 (two) times daily., Disp: 30 tablet, Rfl: 0   nitroGLYCERIN (NITROSTAT) 0.4 MG SL tablet, Place 1 tablet (0.4 mg total) under the tongue every 5 (five) minutes as needed for chest pain., Disp: 25 tablet, Rfl: 6   potassium chloride (KLOR-CON) 10 MEQ tablet, Take 1 tablet (10 mEq total) by mouth 2 (two) times daily., Disp: 60 tablet, Rfl: 0   rosuvastatin (CRESTOR) 10 MG tablet, TAKE 1 TABLET BY MOUTH  DAILY, Disp: 90 tablet, Rfl: 2   tamsulosin (FLOMAX) 0.4 MG CAPS capsule, Take 0.4 mg by mouth at bedtime. , Disp: , Rfl:    Tiotropium Bromide Monohydrate (SPIRIVA RESPIMAT) 2.5 MCG/ACT AERS, Inhale 2 puffs into the lungs daily., Disp: 4 g, Rfl: 5   torsemide (DEMADEX) 20 MG tablet, Take 1 tablet (20 mg total)  by mouth daily., Disp: 30 tablet, Rfl: 0   triamcinolone cream (KENALOG) 0.1 %, Apply 1 application topically as needed (for itching)., Disp: , Rfl:   Past Medical History: Past Medical History:  Diagnosis Date   Acute myocardial infarction    Anemia    Aortic insufficiency    Carotid stenosis    Chronic combined systolic and diastolic CHF (congestive heart failure) (HCC)    Coronary artery disease    PTCA and stenting of his right coronary artery. 3.5 x 16-mm Liberte stent.               Diabetes mellitus without complication ([place])    Dyslipidemia    Emphysema (subcutaneous) (surgical) resulting from a procedure    Hyperlipemia    Hypertension    Mitral regurgitation    Partial tear of rotator cuff    right shoulder   Peripheral vascular disease (HCC)    Persistent atrial fibrillation ([place])    Pulmonary hypertension ([place])    Stroke ([place])    Wears dentures     Tobacco Use: Social History   Tobacco Use  Smoking Status Former   Packs/day: 1.00   Years: 8.00   Pack years: 8.00   Types: Cigarettes   Quit date: 09/12/1968   Years since quitting: 53.3  Smokeless Tobacco Never  Tobacco Comments   quit smoking cigarettes > 40 years ago  Labs: Recent Review Flowsheet Data     Labs for ITP Cardiac and Pulmonary Rehab Latest Ref Rng & Units 08/16/2018 02/19/2019 04/22/2020 06/03/2021 09/28/2021   Cholestrol 100 - 199 mg/dL 125 132 148 131 -   LDLCALC 0 - 99 mg/dL 38 37 52 38 -   HDL >39 mg/dL 66 76 75 79 -   Trlycerides 0 - 149 mg/dL 103 96 119 72 -   Hemoglobin A1c 4.8 - 5.6 % - - - - 6.3(H)        Pulmonary Assessment Scores:  Pulmonary Assessment Scores     Row Name 10/27/21 1156         ADL UCSD   ADL Phase Entry     SOB Score total 46     Rest 0     Walk 4     Stairs 5     Bath 0     Dress 1     Shop 5       CAT Score   CAT Score 8       mMRC Score   mMRC Score 2              UCSD: Self-administered rating of dyspnea associated with  activities of daily living (ADLs) 6-point scale (0 = "not at all" to 5 = "maximal or unable to do because of breathlessness")  Scoring Scores range from 0 to 120.  Minimally important difference is 5 units  CAT: CAT can identify the health impairment of COPD patients and is better correlated with disease progression.  CAT has a scoring range of zero to 40. The CAT score is classified into four groups of low (less than 10), medium (10 - 20), high (21-30) and very high (31-40) based on the impact level of disease on health status. A CAT score over 10 suggests significant symptoms.  A worsening CAT score could be explained by an exacerbation, poor medication adherence, poor inhaler technique, or progression of COPD or comorbid conditions.  CAT MCID is 2 points  mMRC: mMRC (Modified Medical Research Council) Dyspnea Scale is used to assess the degree of baseline functional disability in patients of respiratory disease due to dyspnea. No minimal important difference is established. A decrease in score of 1 point or greater is considered a positive change.   Pulmonary Function Assessment:  Pulmonary Function Assessment - 10/26/21 1545       Breath   Shortness of Breath Yes;Limiting activity             Exercise Target Goals: Exercise Program Goal: Individual exercise prescription set using results from initial 6 min walk test and THRR while considering  patients activity barriers and safety.   Exercise Prescription Goal: Initial exercise prescription builds to 30-45 minutes a day of aerobic activity, 2-3 days per week.  Home exercise guidelines will be given to patient during program as part of exercise prescription that the participant will acknowledge.  Education: Aerobic Exercise: - Group verbal and visual presentation on the components of exercise prescription. Introduces F.I.T.T principle from ACSM for exercise prescriptions.  Reviews F.I.T.T. principles of aerobic exercise  including progression. Written material given at graduation. Flowsheet Row Pulmonary Rehab from 12/30/2021 in [facility] Cardiac and Pulmonary Rehab  Date 12/09/21  Educator [facility]  Instruction Review Code 1- Verbalizes Understanding       Education: Resistance Exercise: - Group verbal and visual presentation on the components of exercise prescription. Introduces F.I.T.T principle from ACSM for exercise prescriptions  Reviews  F.I.T.T. principles of resistance exercise including progression. Written material given at graduation.    Education: Exercise & Equipment Safety: - Individual verbal instruction and demonstration of equipment use and safety with use of the equipment. Flowsheet Row Pulmonary Rehab from 10/26/2021 in [facility] Cardiac and Pulmonary Rehab  Date 10/26/21  Educator [facility]  Instruction Review Code 1- Verbalizes Understanding       Education: Exercise Physiology & General Exercise Guidelines: - Group verbal and written instruction with models to review the exercise physiology of the cardiovascular system and associated critical values. Provides general exercise guidelines with specific guidelines to those with heart or lung disease.  Flowsheet Row Pulmonary Rehab from 12/30/2021 in [facility] Cardiac and Pulmonary Rehab  Education need identified 10/27/21       Education: Flexibility, Balance, Mind/Body Relaxation: - Group verbal and visual presentation with interactive activity on the components of exercise prescription. Introduces F.I.T.T principle from ACSM for exercise prescriptions. Reviews F.I.T.T. principles of flexibility and balance exercise training including progression. Also discusses the mind body connection.  Reviews various relaxation techniques to help reduce and manage stress (i.e. Deep breathing, progressive muscle relaxation, and visualization). Balance handout provided to take home. Written material given at graduation. Flowsheet Row Pulmonary Rehab from 12/30/2021 in [facility]  Cardiac and Pulmonary Rehab  Date 12/23/21  Educator [facility]  Instruction Review Code 1- Verbalizes Understanding       Activity Barriers & Risk Stratification:  Activity Barriers & Cardiac Risk Stratification - 10/27/21 1454       Activity Barriers & Cardiac Risk Stratification   Activity Barriers Assistive Device;Shortness of Breath;Deconditioning;Muscular Weakness;Other (comment)    Comments Rod in right leg             6 Minute Walk:  6 Minute Walk     Row Name 10/27/21 1455         6 Minute Walk   Phase Initial     Distance 955 feet     Walk Time 6 minutes     MPH 1.8     METS 1.82     RPE 13     Perceived Dyspnea  0     VO2 Peak 6.4     Symptoms No     Resting HR 87 bpm     Resting BP 138/64     Resting Oxygen Saturation  98 %     Exercise Oxygen Saturation  during 6 min walk 97 %     Max Ex. HR 109 bpm     Max Ex. BP 144/66     2 Minute Post BP 122/64       Interval HR   1 Minute HR 96     2 Minute HR 98     3 Minute HR 109     4 Minute HR 106     5 Minute HR 99     6 Minute HR 103     2 Minute Post HR 89     Interval Heart Rate? Yes       Interval Oxygen   Interval Oxygen? Yes     Baseline Oxygen Saturation % 98 %     1 Minute Oxygen Saturation % 96 %     1 Minute Liters of Oxygen 0 L  RA     2 Minute Oxygen Saturation % 98 %     2 Minute Liters of Oxygen 0 L     3 Minute Oxygen Saturation % 98 %  3 Minute Liters of Oxygen 0 L     4 Minute Oxygen Saturation % 98 %     4 Minute Liters of Oxygen 0 L     5 Minute Oxygen Saturation % 98 %     5 Minute Liters of Oxygen 0 L     6 Minute Oxygen Saturation % 98 %     6 Minute Liters of Oxygen 0 L     2 Minute Post Oxygen Saturation % 97 %     2 Minute Post Liters of Oxygen 0 L             Oxygen Initial Assessment:  Oxygen Initial Assessment - 10/27/21 1156       Home Oxygen   Home Oxygen Device None    Sleep Oxygen Prescription None    Home Exercise Oxygen Prescription None     Home Resting Oxygen Prescription None    Compliance with Home Oxygen Use Yes      Initial 6 min Walk   Oxygen Used None      Program Oxygen Prescription   Program Oxygen Prescription None      Intervention   Short Term Goals To learn and exhibit compliance with exercise, home and travel O2 prescription;To learn and understand importance of monitoring SPO2 with pulse oximeter and demonstrate accurate use of the pulse oximeter.;To learn and demonstrate proper pursed lip breathing techniques or other breathing techniques. ;To learn and understand importance of maintaining oxygen saturations>88%;To learn and demonstrate proper use of respiratory medications    Long  Term Goals Exhibits compliance with exercise, home  and travel O2 prescription;Verbalizes importance of monitoring SPO2 with pulse oximeter and return demonstration;Maintenance of O2 saturations>88%;Exhibits proper breathing techniques, such as pursed lip breathing or other method taught during program session;Compliance with respiratory medication;Demonstrates proper use of MDIs             Oxygen Re-Evaluation:  Oxygen Re-Evaluation     Row Name 10/29/21 1000 11/23/21 0958           Program Oxygen Prescription   Program Oxygen Prescription None None        Home Oxygen   Home Oxygen Device None None      Sleep Oxygen Prescription None None      Home Exercise Oxygen Prescription None None      Home Resting Oxygen Prescription None None      Compliance with Home Oxygen Use Yes Yes        Goals/Expected Outcomes   Short Term Goals To learn and exhibit compliance with exercise, home and travel O2 prescription;To learn and understand importance of monitoring SPO2 with pulse oximeter and demonstrate accurate use of the pulse oximeter.;To learn and demonstrate proper pursed lip breathing techniques or other breathing techniques. ;To learn and understand importance of maintaining oxygen saturations>88% To learn and exhibit  compliance with exercise, home and travel O2 prescription;To learn and understand importance of monitoring SPO2 with pulse oximeter and demonstrate accurate use of the pulse oximeter.;To learn and demonstrate proper pursed lip breathing techniques or other breathing techniques. ;To learn and understand importance of maintaining oxygen saturations>88%      Long  Term Goals Exhibits compliance with exercise, home  and travel O2 prescription;Verbalizes importance of monitoring SPO2 with pulse oximeter and return demonstration;Maintenance of O2 saturations>88%;Exhibits proper breathing techniques, such as pursed lip breathing or other method taught during program session;Compliance with respiratory medication Exhibits compliance with exercise, home  and travel O2 prescription;Verbalizes  importance of monitoring SPO2 with pulse oximeter and return demonstration;Maintenance of O2 saturations>88%;Exhibits proper breathing techniques, such as pursed lip breathing or other method taught during program session;Compliance with respiratory medication      Comments Reviewed PLB technique with pt.  Talked about how it works and it's importance in maintaining their exercise saturations. Corne[employer] monitors sats at home and they are always baove 88%      Goals/Expected Outcomes Short: Become more profiecient at using PLB.   Long: Become independent at using PLB. ST/LT: maintain oxygen above 88% at all times/use PLB as needed               Oxygen Discharge (Final Oxygen Re-Evaluation):  Oxygen Re-Evaluation - 11/23/21 0958       Program Oxygen Prescription   Program Oxygen Prescription None      Home Oxygen   Home Oxygen Device None    Sleep Oxygen Prescription None    Home Exercise Oxygen Prescription None    Home Resting Oxygen Prescription None    Compliance with Home Oxygen Use Yes      Goals/Expected Outcomes   Short Term Goals To learn and exhibit compliance with exercise, home and travel O2  prescription;To learn and understand importance of monitoring SPO2 with pulse oximeter and demonstrate accurate use of the pulse oximeter.;To learn and demonstrate proper pursed lip breathing techniques or other breathing techniques. ;To learn and understand importance of maintaining oxygen saturations>88%    Long  Term Goals Exhibits compliance with exercise, home  and travel O2 prescription;Verbalizes importance of monitoring SPO2 with pulse oximeter and return demonstration;Maintenance of O2 saturations>88%;Exhibits proper breathing techniques, such as pursed lip breathing or other method taught during program session;Compliance with respiratory medication    Comments [place] monitors sats at home and they are always baove 88%    Goals/Expected Outcomes ST/LT: maintain oxygen above 88% at all times/use PLB as needed             Initial Exercise Prescription:  Initial Exercise Prescription - 10/27/21 1400       Date of Initial Exercise RX and Referring Provider   Date 10/27/21    Referring Provider Lavone Orn MD      Oxygen   Maintain Oxygen Saturation 88% or higher      Recumbant Bike   Level 1    RPM 60    Watts 20    Minutes 15    METs 1.8      NuStep   Level 1    SPM 80    Minutes 15    METs 1.8      Track   Laps 14    Minutes 15    METs 1.76      Prescription Details   Frequency (times per week) 2    Duration Progress to 30 minutes of continuous aerobic without signs/symptoms of physical distress      Intensity   THRR 40-80% of Max Heartrate 105-123    Ratings of Perceived Exertion 11-13    Perceived Dyspnea 0-4      Progression   Progression Continue to progress workloads to maintain intensity without signs/symptoms of physical distress.      Resistance Training   Training Prescription Yes    Weight 3 lb    Reps 10-15             Perform Capillary Blood Glucose checks as needed.  Exercise Prescription Changes:   Exercise Prescription  Changes  [place] Name 10/27/21 1400 11/04/21 0900 11/16/21 1200 12/01/21 1200 12/15/21 0800     Response to Exercise   Blood Pressure (Admit) 138/64 112/60 104/58 128/62 122/64   Blood Pressure (Exercise) 144/66 144/72 122/70 144/60 --   Blood Pressure (Exit) 122/64 118/62 108/60 118/62 128/70   Heart Rate (Admit) 87 bpm 65 bpm 71 bpm 79 bpm 82 bpm   Heart Rate (Exercise) 109 bpm 112 bpm 104 bpm 110 bpm 99 bpm   Heart Rate (Exit) 89 bpm 102 bpm 100 bpm 90 bpm 85 bpm   Oxygen Saturation (Admit) 98 % 97 % 90 % 95 % 98 %   Oxygen Saturation (Exercise) 97 % 92 % 90 % 94 % 92 %   Oxygen Saturation (Exit) 97 % 98 % 97 % 98 % 98 %   Rating of Perceived Exertion (Exercise) 13 13 12 9 15    Perceived Dyspnea (Exercise) 0 1 1 0 0   Symptoms none none none none none   Comments walk test results third session -- -- --   Duration -- Continue with 30 min of aerobic exercise without signs/symptoms of physical distress. Continue with 30 min of aerobic exercise without signs/symptoms of physical distress. Continue with 30 min of aerobic exercise without signs/symptoms of physical distress. Continue with 30 min of aerobic exercise without signs/symptoms of physical distress.   Intensity -- THRR unchanged THRR unchanged THRR unchanged THRR unchanged     Progression   Progression -- Continue to progress workloads to maintain intensity without signs/symptoms of physical distress. Continue to progress workloads to maintain intensity without signs/symptoms of physical distress. Continue to progress workloads to maintain intensity without signs/symptoms of physical distress. Continue to progress workloads to maintain intensity without signs/symptoms of physical distress.   Average METs -- 1.4 3 2.75 2.7     Resistance Training   Training Prescription -- Yes Yes Yes Yes   Weight -- 3 lb 3 lb 3 lb 3 lb   Reps -- 10-15 10-15 10-15 --     Interval Training   Interval Training -- -- No No No     Recumbant Bike    Level -- -- -- -- 2   Watts -- -- -- -- 25   Minutes -- -- -- -- 15   METs -- -- -- -- 3.25     NuStep   Level -- -- -- -- 3   Minutes -- -- -- -- 15   METs -- -- -- -- 3.3     Arm Ergometer   Level -- 1 1 -- 1   Minutes -- 15 15 -- 15   METs -- 1 -- -- 1.9     T5 Nustep   Level -- -- -- -- 3   Minutes -- -- -- -- 15   METs -- -- -- -- 2.1     Biostep-RELP   Level -- -- 2 3 --   Minutes -- -- 15 15 --   METs -- -- 3 3 --     Track   Laps -- 14 38 27 27   Minutes -- 15 15 15 15    METs -- 1.76 3.07 2.47 2.47     Oxygen   Maintain Oxygen Saturation -- -- 88% or higher 88% or higher 88% or higher    Row Name 12/29/21 1200             Response to Exercise   Blood Pressure (Admit) 122/64  Blood Pressure (Exit) 114/62       Heart Rate (Admit) 78 bpm       Heart Rate (Exercise) 105 bpm       Heart Rate (Exit) 80 bpm       Oxygen Saturation (Admit) 94 %       Oxygen Saturation (Exercise) 90 %       Oxygen Saturation (Exit) 98 %       Rating of Perceived Exertion (Exercise) 10       Perceived Dyspnea (Exercise) 0       Symptoms none       Duration Continue with 30 min of aerobic exercise without signs/symptoms of physical distress.       Intensity THRR unchanged         Progression   Progression Continue to progress workloads to maintain intensity without signs/symptoms of physical distress.       Average METs 2.8         Resistance Training   Training Prescription Yes       Weight 3 lb       Reps 10-15         Interval Training   Interval Training No         Biostep-RELP   Level 3       Minutes 15       METs 3         Track   Laps 30       Minutes 15       METs 2.63         Oxygen   Maintain Oxygen Saturation 88% or higher                Exercise Comments:   Exercise Comments     Row Name 10/29/21 [phone]           Exercise Comments First full day of exercise!  Patient was oriented to gym and equipment including functions,  settings, policies, and procedures.  Patient's individual exercise prescription and treatment plan were reviewed.  All starting workloads were established based on the results of the 6 minute walk test done at initial orientation visit.  The plan for exercise progression was also introduced and progression will be customized based on patient's performance and goals.                Exercise Goals and Review:   Exercise Goals     Row Name 10/27/21 1501             Exercise Goals   Increase Physical Activity Yes       Intervention Provide advice, education, support and counseling about physical activity/exercise needs.;Develop an individualized exercise prescription for aerobic and resistive training based on initial evaluation findings, risk stratification, comorbidities and participant's personal goals.       Expected Outcomes Short Term: Attend rehab on a regular basis to increase amount of physical activity.;Long Term: Add in home exercise to make exercise part of routine and to increase amount of physical activity.;Long Term: Exercising regularly at least 3-5 days a week.       Increase Strength and Stamina Yes       Intervention Provide advice, education, support and counseling about physical activity/exercise needs.;Develop an individualized exercise prescription for aerobic and resistive training based on initial evaluation findings, risk stratification, comorbidities and participant's personal goals.       Expected Outcomes Short Term: Increase workloads from initial exercise prescription for resistance,  speed, and METs.;Short Term: Perform resistance training exercises routinely during rehab and add in resistance training at home;Long Term: Improve cardiorespiratory fitness, muscular endurance and strength as measured by increased METs and functional capacity ( )       Able to understand and use rate of perceived exertion (RPE) scale Yes       Intervention Provide education and  explanation on how to use RPE scale       Expected Outcomes Short Term: Able to use RPE daily in rehab to express subjective intensity level;Long Term:  Able to use RPE to guide intensity level when exercising independently       Able to understand and use Dyspnea scale Yes       Intervention Provide education and explanation on how to use Dyspnea scale       Expected Outcomes Short Term: Able to use Dyspnea scale daily in rehab to express subjective sense of shortness of breath during exertion;Long Term: Able to use Dyspnea scale to guide intensity level when exercising independently       Knowledge and understanding of Target Heart Rate Range (THRR) Yes       Intervention Provide education and explanation of THRR including how the numbers were predicted and where they are located for reference       Expected Outcomes Short Term: Able to state/look up THRR;Long Term: Able to use THRR to govern intensity when exercising independently;Short Term: Able to use daily as guideline for intensity in rehab       Able to check pulse independently Yes       Intervention Provide education and demonstration on how to check pulse in carotid and radial arteries.;Review the importance of being able to check your own pulse for safety during independent exercise       Expected Outcomes Short Term: Able to explain why pulse checking is important during independent exercise;Long Term: Able to check pulse independently and accurately       Understanding of Exercise Prescription Yes       Intervention Provide education, explanation, and written materials on patient's individual exercise prescription       Expected Outcomes Short Term: Able to explain program exercise prescription;Long Term: Able to explain home exercise prescription to exercise independently                Exercise Goals Re-Evaluation :  Exercise Goals Re-Evaluation     Row Name 10/29/21 5284 11/04/21 0903 11/16/21 1212 11/23/21 0950 12/01/21  1258     Exercise Goal Re-Evaluation   Exercise Goals Review Increase Physical Activity;Able to understand and use rate of perceived exertion (RPE) scale;Knowledge and understanding of Target Heart Rate Range (THRR);Understanding of Exercise Prescription;Increase Strength and Stamina;Able to understand and use Dyspnea scale;Able to check pulse independently Increase Physical Activity;Increase Strength and Stamina Increase Physical Activity;Increase Strength and Stamina;Understanding of Exercise Prescription Increase Physical Activity;Increase Strength and Stamina Increase Physical Activity;Increase Strength and Stamina   Comments Reviewed RPE and dyspnea scales, THR and program prescription with pt today.  Pt voiced understanding and was given a copy of goals to take home. Osbaldo is tolerating exercise well so far.  Oxygen has stayed in the 90s and he reaches THR range Pt has been out since 11/04/21 with appointment and being out of town.  They also mentioned that they may need to change his class schedule again.  He is on level 2 for the BioStep and walkin 38 laps. Reviewed home exercise with pt today.  Pt plans to use home equipment for exercise.  Reviewed THR, pulse, RPE, sign and symptoms, pulse oximetery and when to call 911 or MD.  Also discussed weather considerations and indoor options.  Pt voiced understanding. --   Expected Outcomes Short: Use RPE daily to regulate intensity. Long: Follow program prescription in THR. Short: attend consistently Long:  build overall stamina Short: return to regulay attendance Long: Continue to improve stamina Short:monitor HR/O2 when exercising at home Long:maintain exercise independently --    Row Name 12/01/21 1300 12/15/21 0843 12/21/21 0929 12/29/21 1248       Exercise Goal Re-Evaluation   Exercise Goals Review -- Increase Physical Activity;Increase Strength and Stamina Increase Physical Activity;Increase Strength and Stamina Increase Physical  Activity;Increase Strength and Stamina    Comments Browning is tolerating exercise well.  His RPE is 9 and he is not reaching THR range so staff will encourage increasing levels to RPE 11-13 or THR range Vicent is doing well in rehab. He has increased to level 3 on the T4 Nustep and level 2 on the recumbant bike. His oxygen levels are staying above 88% during exercise and is maintaining RPEs focused more in the range of 11-13. He would benefit working up number of laps on the track. Will continue to monitor. Briar does his health rider for 90 minutes on days not at [facility].  He does not check O2 or HR while exercising.  We reviewed the importance of checking those during exercise.  He has a-fib so HR may fluctuate.  Staff advised calling his Dr if HR gets above high of 123 during exercise.  He sees Dr Thursday.  He is up to 30 laps on the track. Farron has worked up to 30 laps on the track and level 3 on Biostep.  Staff will encourage trying 4 lb for strength work.    Expected Outcomes Short: reach correct RPE/HR Long: improve overall stamina Short: Increase number of laps on track progressively Long: Continue to increase overall MET level Short: monitor Hr /O2 at home follow up with Dr about HR/afib Long: continue to exercise at home Short: try 4 lb Long:  continue to build stamina             Discharge Exercise Prescription (Final Exercise Prescription Changes):  Exercise Prescription Changes - 12/29/21 1200       Response to Exercise   Blood Pressure (Admit) 122/64    Blood Pressure (Exit) 114/62    Heart Rate (Admit) 78 bpm    Heart Rate (Exercise) 105 bpm    Heart Rate (Exit) 80 bpm    Oxygen Saturation (Admit) 94 %    Oxygen Saturation (Exercise) 90 %    Oxygen Saturation (Exit) 98 %    Rating of Perceived Exertion (Exercise) 10    Perceived Dyspnea (Exercise) 0    Symptoms none    Duration Continue with 30 min of aerobic exercise without signs/symptoms of physical distress.     Intensity THRR unchanged      Progression   Progression Continue to progress workloads to maintain intensity without signs/symptoms of physical distress.    Average METs 2.8      Resistance Training   Training Prescription Yes    Weight 3 lb    Reps 10-15      Interval Training   Interval Training No      Biostep-RELP   Level 3    Minutes 15    METs 3  Track   Laps 30    Minutes 15    METs 2.63      Oxygen   Maintain Oxygen Saturation 88% or higher             Nutrition:  Target Goals: Understanding of nutrition guidelines, daily intake of sodium '1500mg'$ , cholesterol '200mg'$ , calories 30% from fat and 7% or less from saturated fats, daily to have 5 or more servings of fruits and vegetables.  Education: All About Nutrition: -Group instruction provided by verbal, written material, interactive activities, discussions, models, and posters to present general guidelines for heart healthy nutrition including fat, fiber, MyPlate, the role of sodium in heart healthy nutrition, utilization of the nutrition label, and utilization of this knowledge for meal planning. Follow up email sent as well. Written material given at graduation. Flowsheet Row Pulmonary Rehab from 12/30/2021 in [facility] Cardiac and Pulmonary Rehab  Date 10/29/21  Educator [facility]  Instruction Review Code 1- Verbalizes Understanding       Biometrics:  Pre Biometrics - 10/27/21 1454       Pre Biometrics   Height 5' 6.5" (1.689 m)    Weight 136 lb 9.6 oz (62 kg)    BMI (Calculated) 21.72    Single Leg Stand 4.5 seconds              Nutrition Therapy Plan and Nutrition Goals:  Nutrition Therapy & Goals - 12/02/21 0933       Nutrition Therapy   Diet Heart healthy, low Na, pulmonary MNT    Drug/Food Interactions Statins/Certain Fruits    Protein (specify units) 75g   1.2/kg of body weight - COPD   Fiber 30 grams    Whole Grain Foods 3 servings    Saturated Fats 12 max. grams    Fruits and  Vegetables 8 servings/day    Sodium 1.5 grams      Personal Nutrition Goals   Nutrition Goal ST:  add some healthy fats to meals like peanut butter which also has added protein, heart helathy oils like oilve oil to some of his vegetables, boost or ensure in between meals, or some cheese/nuts with his fruit (he enjoys grapes and oranges) LT: meet protien and calorie needs, maintain weight    Comments 88 y.o. M with T2DM, HTN, CAD, CHF, HLD, COPD admitted for pulmonary emphysemia. Relevant medications: metformin, crestor. Per MD note 11/23, pt is maintaining weight ~135-136lbs, weight as of 07/27/21 149lbs. Latest A1C as of 10/22 was 6.4. PYP 63.  He reports his weight has been stable, he reports his energy level is good and has improved since beginning rehab.  He reports his blood sugar has been good and he checks his BG daily ~ 110-125. He feels that his appetite is good. Yesterday he had a bowl of raisin bran with banana, lunch was a cold cut sandwich with lots of vegetables, no dinner. He normally has about two meals per day. Lunch could vary and be brussels sprouts and carrots with salmon or baked pork chop. he has a garden and also freezes and cans his own foods. He reports using no fat with cooking. Drinks: water.  Encourged to add some healthy fats to meals like peanut butter which also has added protein, heart helathy oils like oilve oil to some of his vegetables, boost or ensure in between meals, or some cheese/nuts with his fruit (he enjoys grapes and oranges). Discussed increased needs with COPD and importance of meeting those needs as well as weight maintenance.  Discussed heart healthy nutrition and pulmonary MNT.      Intervention Plan   Intervention Prescribe, educate and counsel regarding individualized specific dietary modifications aiming towards targeted core components such as weight, hypertension, lipid management, diabetes, heart failure and other comorbidities.    Expected Outcomes Short  Term Goal: Understand basic principles of dietary content, such as calories, fat, sodium, cholesterol and nutrients.;Short Term Goal: A plan has been developed with personal nutrition goals set during dietitian appointment.;Long Term Goal: Adherence to prescribed nutrition plan.             Nutrition Assessments:  MEDIFICTS Score Key: ?70 Need to make dietary changes  40-70 Heart Healthy Diet ? 40 Therapeutic Level Cholesterol Diet  Flowsheet Row Pulmonary Rehab from 10/27/2021 in [facility] Cardiac and Pulmonary Rehab  Picture Your Plate Total Score on Admission 63      Picture Your Plate Scores: <57 Unhealthy dietary pattern with much room for improvement. 41-50 Dietary pattern unlikely to meet recommendations for good health and room for improvement. 51-60 More healthful dietary pattern, with some room for improvement.  >60 Healthy dietary pattern, although there may be some specific behaviors that could be improved.   Nutrition Goals Re-Evaluation:  Nutrition Goals Re-Evaluation     Row Name 12/21/21 0936             Goals   Comment Kamareon has added peanut butter to his diet.  He has cheese or nuts with fruit.  he voiced undestanding of this keeping BG steady.       Expected Outcome ST/LT: meet caloric and protein needs, keep BG in recommended levels                Nutrition Goals Discharge (Final Nutrition Goals Re-Evaluation):  Nutrition Goals Re-Evaluation - 12/21/21 0936       Goals   Comment Jaevon has added peanut butter to his diet.  He has cheese or nuts with fruit.  he voiced undestanding of this keeping BG steady.    Expected Outcome ST/LT: meet caloric and protein needs, keep BG in recommended levels             Psychosocial: Target Goals: Acknowledge presence or absence of significant depression and/or stress, maximize coping skills, provide positive support system. Participant is able to verbalize types and ability to use techniques and skills  needed for reducing stress and depression.   Education: Stress, Anxiety, and Depression - Group verbal and visual presentation to define topics covered.  Reviews how body is impacted by stress, anxiety, and depression.  Also discusses healthy ways to reduce stress and to treat/manage anxiety and depression.  Written material given at graduation.   Education: Sleep Hygiene -Provides group verbal and written instruction about how sleep can affect your health.  Define sleep hygiene, discuss sleep cycles and impact of sleep habits. Review good sleep hygiene tips.    Initial Review & Psychosocial Screening:  Initial Psych Review & Screening - 10/26/21 [place]? Yes    Comments He can look to his neighbors, family and daughter for support. He has a positive outlook on his health.      Barriers   Psychosocial barriers to participate in program There are no identifiable barriers or psychosocial needs.;The patient should benefit from training in stress management and relaxation.      Screening Interventions   Interventions To provide support and resources with identified psychosocial needs;Encouraged to  exercise;Provide feedback about the scores to participant    Expected Outcomes Short Term goal: Utilizing psychosocial counselor, staff and physician to assist with identification of specific Stressors or current issues interfering with healing process. Setting desired goal for each stressor or current issue identified.;Long Term Goal: Stressors or current issues are controlled or eliminated.;Short Term goal: Identification and review with participant of any Quality of Life or Depression concerns found by scoring the questionnaire.;Long Term goal: The participant improves quality of Life and PHQ9 Scores as seen by post scores and/or verbalization of changes             Quality of Life Scores:  Scores of 19 and below usually indicate a poorer quality of  life in these areas.  A difference of  2-3 points is a clinically meaningful difference.  A difference of 2-3 points in the total score of the Quality of Life Index has been associated with significant improvement in overall quality of life, self-image, physical symptoms, and general health in studies assessing change in quality of life.  PHQ-9: Recent Review Flowsheet Data     Depression screen [facility] 2/9 10/27/2021   Decreased Interest 0   Down, Depressed, Hopeless 0   PHQ - 2 Score 0   Altered sleeping 3   Tired, decreased energy 1   Change in appetite 3   Feeling bad or failure about yourself  0   Trouble concentrating 0   Moving slowly or fidgety/restless 0   Suicidal thoughts 0   PHQ-9 Score 7   Difficult doing work/chores Not difficult at all      Interpretation of Total Score  Total Score Depression Severity:  1-4 = Minimal depression, 5-9 = Mild depression, 10-14 = Moderate depression, 15-19 = Moderately severe depression, 20-27 = Severe depression   Psychosocial Evaluation and Intervention:  Psychosocial Evaluation - 10/26/21 1548       Psychosocial Evaluation & Interventions   Interventions Encouraged to exercise with the program and follow exercise prescription;Relaxation education;Stress management education    Comments He can look to his neighbors, family and daughter for support. He has a positive outlook on his health.    Expected Outcomes Short: Start [facility] to help with mood. Long: Maintain a healthy mental state.    Continue Psychosocial Services  Follow up required by staff             Psychosocial Re-Evaluation:  Psychosocial Re-Evaluation     [place] Name 11/23/21 0931 12/21/21 0935           Psychosocial Re-Evaluation   Current issues with None Identified None Identified      Comments Dontrell reports no symptoms of anxiety, depression ot stress. Elber sleeps well and feels rested.  He reports no signs of anxiety, depression or stress.       Expected Outcomes Short: notify staff of any changes Long: maintain positive outlook Short: maintain current sleep and stress levels Long: stay positive               Psychosocial Discharge (Final Psychosocial Re-Evaluation):  Psychosocial Re-Evaluation - 12/21/21 0935       Psychosocial Re-Evaluation   Current issues with None Identified    Comments Keiston sleeps well and feels rested.  He reports no signs of anxiety, depression or stress.    Expected Outcomes Short: maintain current sleep and stress levels Long: stay positive             Education: Education Goals: Education classes will be  provided on a weekly basis, covering required topics. Participant will state understanding/return demonstration of topics presented.  Learning Barriers/Preferences:  Learning Barriers/Preferences - 10/26/21 1545       Learning Barriers/Preferences   Learning Barriers None    Learning Preferences None             General Pulmonary Education Topics:  Infection Prevention: - Provides verbal and written material to individual with discussion of infection control including proper hand washing and proper equipment cleaning during exercise session. Flowsheet Row Pulmonary Rehab from 10/26/2021 in [facility] Cardiac and Pulmonary Rehab  Date 10/26/21  Educator [facility]  Instruction Review Code 1- Verbalizes Understanding       Falls Prevention: - Provides verbal and written material to individual with discussion of falls prevention and safety. Flowsheet Row Pulmonary Rehab from 10/26/2021 in [facility] Cardiac and Pulmonary Rehab  Date 10/26/21  Educator [facility]  Instruction Review Code 1- Verbalizes Understanding       Chronic Lung Disease Review: - Group verbal instruction with posters, models, PowerPoint presentations and videos,  to review new updates, new respiratory medications, new advancements in procedures and treatments. Providing information on websites and "800" numbers for continued  self-education. Includes information about supplement oxygen, available portable oxygen systems, continuous and intermittent flow rates, oxygen safety, concentrators, and Medicare reimbursement for oxygen. Explanation of Pulmonary Drugs, including class, frequency, complications, importance of spacers, rinsing mouth after steroid MDI's, and proper cleaning methods for nebulizers. Review of basic lung anatomy and physiology related to function, structure, and complications of lung disease. Review of risk factors. Discussion about methods for diagnosing sleep apnea and types of masks and machines for OSA. Includes a review of the use of types of environmental controls: home humidity, furnaces, filters, dust mite/pet prevention, HEPA vacuums. Discussion about weather changes, air quality and the benefits of nasal washing. Instruction on Warning signs, infection symptoms, calling MD promptly, preventive modes, and value of vaccinations. Review of effective airway clearance, coughing and/or vibration techniques. Emphasizing that all should Create an Action Plan. Written material given at graduation. Flowsheet Row Pulmonary Rehab from 12/30/2021 in [facility] Cardiac and Pulmonary Rehab  Education need identified 10/27/21  Date 11/18/21  Educator [facility]  Instruction Review Code 1- Verbalizes Understanding       AED/CPR: - Group verbal and written instruction with the use of models to demonstrate the basic use of the AED with the basic ABC's of resuscitation.    Anatomy and Cardiac Procedures: - Group verbal and visual presentation and models provide information about basic cardiac anatomy and function. Reviews the testing methods done to diagnose heart disease and the outcomes of the test results. Describes the treatment choices: Medical Management, Angioplasty, or Coronary Bypass Surgery for treating various heart conditions including Myocardial Infarction, Angina, Valve Disease, and Cardiac Arrhythmias.  Written  material given at graduation.   Medication Safety: - Group verbal and visual instruction to review commonly prescribed medications for heart and lung disease. Reviews the medication, class of the drug, and side effects. Includes the steps to properly store meds and maintain the prescription regimen.  Written material given at graduation. Flowsheet Row Pulmonary Rehab from 12/30/2021 in [facility] Cardiac and Pulmonary Rehab  Date 12/30/21  Educator [facility]  Instruction Review Code 1- Verbalizes Understanding       Other: -Provides group and verbal instruction on various topics (see comments)   Knowledge Questionnaire Score:  Knowledge Questionnaire Score - 10/27/21 1449       Knowledge Questionnaire Score  Pre Score 12/18: Lung disease, ADL, O2, PLB              Core Components/Risk Factors/Patient Goals at Admission:  Personal Goals and Risk Factors at Admission - 10/27/21 1502       Core Components/Risk Factors/Patient Goals on Admission    Weight Management Yes;Weight Maintenance    Intervention Weight Management: Develop a combined nutrition and exercise program designed to reach desired caloric intake, while maintaining appropriate intake of nutrient and fiber, sodium and fats, and appropriate energy expenditure required for the weight goal.;Weight Management: Provide education and appropriate resources to help participant work on and attain dietary goals.;Weight Management/Obesity: Establish reasonable short term and long term weight goals.    Admit Weight 136 lb (61.7 kg)    Goal Weight: Short Term 136 lb (61.7 kg)    Goal Weight: Long Term 136 lb (61.7 kg)    Expected Outcomes Short Term: Continue to assess and modify interventions until short term weight is achieved;Long Term: Adherence to nutrition and physical activity/exercise program aimed toward attainment of established weight goal;Understanding recommendations for meals to include 15-35% energy as protein, 25-35% energy  from fat, 35-60% energy from carbohydrates, less than $RemoveB'200mg'eGdnCmAO$  of dietary cholesterol, 20-35 gm of total fiber daily;Understanding of distribution of calorie intake throughout the day with the consumption of 4-5 meals/snacks;Weight Maintenance: Understanding of the daily nutrition guidelines, which includes 25-35% calories from fat, 7% or less cal from saturated fats, less than $RemoveB'200mg'RwCjiEGe$  cholesterol, less than 1.5gm of sodium, & 5 or more servings of fruits and vegetables daily    Improve shortness of breath with ADL's Yes    Intervention Provide education, individualized exercise plan and daily activity instruction to help decrease symptoms of SOB with activities of daily living.    Expected Outcomes Short Term: Improve cardiorespiratory fitness to achieve a reduction of symptoms when performing ADLs;Long Term: Be able to perform more ADLs without symptoms or delay the onset of symptoms    Diabetes Yes    Intervention Provide education about signs/symptoms and action to take for hypo/hyperglycemia.;Provide education about proper nutrition, including hydration, and aerobic/resistive exercise prescription along with prescribed medications to achieve blood glucose in normal ranges: Fasting glucose 65-99 mg/dL    Expected Outcomes Short Term: Participant verbalizes understanding of the signs/symptoms and immediate care of hyper/hypoglycemia, proper foot care and importance of medication, aerobic/resistive exercise and nutrition plan for blood glucose control.;Long Term: Attainment of HbA1C < 7%.    Hypertension Yes    Intervention Provide education on lifestyle modifcations including regular physical activity/exercise, weight management, moderate sodium restriction and increased consumption of fresh fruit, vegetables, and low fat dairy, alcohol moderation, and smoking cessation.;Monitor prescription use compliance.    Expected Outcomes Short Term: Continued assessment and intervention until BP is < 140/59mm HG in  hypertensive participants. < 130/30mm HG in hypertensive participants with diabetes, heart failure or chronic kidney disease.;Long Term: Maintenance of blood pressure at goal levels.    Lipids Yes    Intervention Provide education and support for participant on nutrition & aerobic/resistive exercise along with prescribed medications to achieve LDL '70mg'$ , HDL >$Remo'40mg'thDZH$ .    Expected Outcomes Short Term: Participant states understanding of desired cholesterol values and is compliant with medications prescribed. Participant is following exercise prescription and nutrition guidelines.;Long Term: Cholesterol controlled with medications as prescribed, with individualized exercise RX and with personalized nutrition plan. Value goals: LDL < $Rem'70mg'MGGF$ , HDL > 40 mg.  Education:Diabetes - Individual verbal and written instruction to review signs/symptoms of diabetes, desired ranges of glucose level fasting, after meals and with exercise. Acknowledge that pre and post exercise glucose checks will be done for 3 sessions at entry of program. Flowsheet Row Pulmonary Rehab from 10/26/2021 in [facility] Cardiac and Pulmonary Rehab  Date 10/26/21  Educator [facility]  Instruction Review Code 1- Verbalizes Understanding       Know Your Numbers and Heart Failure: - Group verbal and visual instruction to discuss disease risk factors for cardiac and pulmonary disease and treatment options.  Reviews associated critical values for Overweight/Obesity, Hypertension, Cholesterol, and Diabetes.  Discusses basics of heart failure: signs/symptoms and treatments.  Introduces Heart Failure Zone chart for action plan for heart failure.  Written material given at graduation.   Core Components/Risk Factors/Patient Goals Review:   Goals and Risk Factor Review     Row Name 11/23/21 0928 12/21/21 0925           Core Components/Risk Factors/Patient Goals Review   Personal Goals Review Diabetes;Improve shortness of breath with  ADL's;Weight Management/Obesity Improve shortness of breath with ADL's;Diabetes;Weight Management/Obesity      Review Makale checks fasting BG every day.  Today it was 117.  His weight stays steady and he weighs every morning.  He says his breathing is better and he is able to do more than he could before. Habib still checks FBG daily.  It was 131 today.  His weight continues to stay steady.  He is able to work outdoors without getting out of breath.  He works until he gets tired, then rests.  He can do more than before starting to exercise.      Expected Outcomes Short:  contnue to monitor weight and BG Long: keep risk factors at recommended levels Short: continue to monitor BG/weight Long: maintain BG and weight at optimal levels               Core Components/Risk Factors/Patient Goals at Discharge (Final Review):   Goals and Risk Factor Review - 12/21/21 0925       Core Components/Risk Factors/Patient Goals Review   Personal Goals Review Improve shortness of breath with ADL's;Diabetes;Weight Management/Obesity    Review Delonta still checks FBG daily.  It was 131 today.  His weight continues to stay steady.  He is able to work outdoors without getting out of breath.  He works until he gets tired, then rests.  He can do more than before starting to exercise.    Expected Outcomes Short: continue to monitor BG/weight Long: maintain BG and weight at optimal levels             ITP Comments:  ITP Comments     Row Name 10/26/21 1543 10/27/21 1151 10/29/21 0952 11/11/21 0704 11/16/21 1212   ITP Comments Virtual Visit completed. Patient informed on EP and RD appointment and 6 Minute walk test. Patient also informed of patient health questionnaires on My Chart. Patient Verbalizes understanding. Visit diagnosis can be found in [facility] 08/20/2021. Completed 6MWT and gym orientation. Initial ITP created and sent for review to Dr. Ottie Glazier, Medical Director. First full day of exercise!   Patient was oriented to gym and equipment including functions, settings, policies, and procedures.  Patient's individual exercise prescription and treatment plan were reviewed.  All starting workloads were established based on the results of the 6 minute walk test done at initial orientation visit.  The plan for exercise progression was also introduced and  progression will be customized based on patient's performance and goals. 30 Day review completed. Medical Director ITP review done, changes made as directed, and signed approval by Medical Director.   New to program Out since 11/04/21    Row Name 12/09/21 0728 01/06/22 0858         ITP Comments 30 Day review completed. Medical Director ITP review done, changes made as directed, and signed approval by Medical Director. 30 Day review completed. Medical Director ITP review done, changes made as directed, and signed approval by Medical Director.               Comments:

## 2022-01-08 ENCOUNTER — Encounter: Payer: Medicare Other | Admitting: *Deleted

## 2022-01-08 ENCOUNTER — Other Ambulatory Visit: Payer: Self-pay

## 2022-01-08 DIAGNOSIS — J439 Emphysema, unspecified: Secondary | ICD-10-CM | POA: Diagnosis not present

## 2022-01-08 DIAGNOSIS — R5383 Other fatigue: Secondary | ICD-10-CM | POA: Diagnosis not present

## 2022-01-08 DIAGNOSIS — B948 Sequelae of other specified infectious and parasitic diseases: Secondary | ICD-10-CM | POA: Diagnosis not present

## 2022-01-08 NOTE — Progress Notes (Signed)
Daily Session Note  Patient Details  Name: Todd Armstrong MRN: [id number] Date of Birth: 06/22/1931 Referring Provider:   Flowsheet Row Pulmonary Rehab from 10/27/2021 in [facility] Cardiac and Pulmonary Rehab  Referring Provider Lavone Orn MD       Encounter Date: 01/08/2022  Check In:  Session Check In - 01/08/22 0940       Check-In   Supervising physician immediately available to respond to emergencies See telemetry face sheet for immediately available ER MD    Location [facility]-Cardiac & Pulmonary Rehab    Staff Present Renita Papa, RN BSN;Melissa [place], RDN, LDN;Joseph [place], [place]    Virtual Visit No    Medication changes reported     No    Fall or balance concerns reported    No    Warm-up and Cool-down Performed on first and last piece of equipment    Resistance Training Performed Yes    VAD Patient? No    PAD/SET Patient? No      Pain Assessment   Currently in Pain? No/denies                Social History   Tobacco Use  Smoking Status Former   Packs/day: 1.00   Years: 8.00   Pack years: 8.00   Types: Cigarettes   Quit date: 09/12/1968   Years since quitting: 53.3  Smokeless Tobacco Never  Tobacco Comments   quit smoking cigarettes > 40 years ago    Goals Met:  Independence with exercise equipment Exercise tolerated well No report of concerns or symptoms today Strength training completed today  Goals Unmet:  Not Applicable  Comments: Pt able to follow exercise prescription today without complaint.  Will continue to monitor for progression.    Dr. Emily Filbert is Medical Director for [place].  Dr. Ottie Glazier is Medical Director for [facility] Pulmonary Rehabilitation.

## 2022-01-11 ENCOUNTER — Encounter: Payer: Medicare Other | Admitting: *Deleted

## 2022-01-11 ENCOUNTER — Other Ambulatory Visit: Payer: Self-pay

## 2022-01-11 DIAGNOSIS — R5383 Other fatigue: Secondary | ICD-10-CM | POA: Diagnosis not present

## 2022-01-11 DIAGNOSIS — J439 Emphysema, unspecified: Secondary | ICD-10-CM | POA: Diagnosis not present

## 2022-01-11 DIAGNOSIS — B948 Sequelae of other specified infectious and parasitic diseases: Secondary | ICD-10-CM | POA: Diagnosis not present

## 2022-01-11 NOTE — Progress Notes (Signed)
Daily Session Note  Patient Details  Name: Todd Armstrong MRN: [id number] Date of Birth: [birth year] Referring Provider:   Flowsheet Row Pulmonary Rehab from 10/27/2021 in [facility] Cardiac and Pulmonary Rehab  Referring Provider Lavone Orn MD       Encounter Date: 01/11/2022  Check In:  Session Check In - 01/11/22 0925       Check-In   Supervising physician immediately available to respond to emergencies See telemetry face sheet for immediately available ER MD    Location [facility]-Cardiac & Pulmonary Rehab    Staff Present Heath Lark, RN, BSN, CCRP;Joseph [place], RCP,RRT,BSRT;Laureen [place], [place], RRT, CPFT    Virtual Visit No    Medication changes reported     No    Fall or balance concerns reported    No    Warm-up and Cool-down Performed on first and last piece of equipment    Resistance Training Performed Yes    VAD Patient? No    PAD/SET Patient? No      Pain Assessment   Currently in Pain? No/denies                Social History   Tobacco Use  Smoking Status Former   Packs/day: 1.00   Years: 8.00   Pack years: 8.00   Types: Cigarettes   Quit date: 09/12/1968   Years since quitting: 53.3  Smokeless Tobacco Never  Tobacco Comments   quit smoking cigarettes > 40 years ago    Goals Met:  Proper associated with RPD/PD & O2 Sat Independence with exercise equipment Exercise tolerated well No report of concerns or symptoms today  Goals Unmet:  Not Applicable  Comments: Pt able to follow exercise prescription today without complaint.  Will continue to monitor for progression.    Dr. Emily Filbert is Medical Director for [place].  Dr. Ottie Glazier is Medical Director for [facility] Pulmonary Rehabilitation.

## 2022-01-12 ENCOUNTER — Other Ambulatory Visit: Payer: Self-pay

## 2022-01-12 DIAGNOSIS — J439 Emphysema, unspecified: Secondary | ICD-10-CM | POA: Diagnosis not present

## 2022-01-12 DIAGNOSIS — B948 Sequelae of other specified infectious and parasitic diseases: Secondary | ICD-10-CM | POA: Diagnosis not present

## 2022-01-12 DIAGNOSIS — R5383 Other fatigue: Secondary | ICD-10-CM | POA: Diagnosis not present

## 2022-01-12 NOTE — Progress Notes (Signed)
Daily Session Note  Patient Details  Name: Todd Armstrong MRN: [id number] Date of Birth: [birth year] Referring Provider:   Flowsheet Row Pulmonary Rehab from 10/27/2021 in [facility] Cardiac and Pulmonary Rehab  Referring Provider Lavone Orn MD       Encounter Date: 01/12/2022  Check In:  Session Check In - 01/12/22 6825       Check-In   Supervising physician immediately available to respond to emergencies See telemetry face sheet for immediately available ER MD    Location [facility]-Cardiac & Pulmonary Rehab    Staff Present Birdie Sons, MPA, RN;Jessica Luan Pulling, MA, RCEP, CCRP, CCET;Amanda Sommer, BA, ACSM CEP, Exercise Physiologist    Virtual Visit No    Medication changes reported     No    Fall or balance concerns reported    No    Tobacco Cessation No Change    Warm-up and Cool-down Performed on first and last piece of equipment    Resistance Training Performed Yes    VAD Patient? No    PAD/SET Patient? No      Pain Assessment   Currently in Pain? No/denies                Social History   Tobacco Use  Smoking Status Former   Packs/day: 1.00   Years: 8.00   Pack years: 8.00   Types: Cigarettes   Quit date: 09/12/1968   Years since quitting: 53.3  Smokeless Tobacco Never  Tobacco Comments   quit smoking cigarettes > 40 years ago    Goals Met:  Independence with exercise equipment Exercise tolerated well No report of concerns or symptoms today Strength training completed today  Goals Unmet:  Not Applicable  Comments: Pt able to follow exercise prescription today without complaint.  Will continue to monitor for progression.    Dr. Emily Filbert is Medical Director for [place].  Dr. Ottie Glazier is Medical Director for [facility] Pulmonary Rehabilitation.

## 2022-01-13 ENCOUNTER — Encounter: Payer: Medicare Other | Attending: Internal Medicine

## 2022-01-13 ENCOUNTER — Other Ambulatory Visit: Payer: Self-pay

## 2022-01-13 DIAGNOSIS — J439 Emphysema, unspecified: Secondary | ICD-10-CM | POA: Diagnosis not present

## 2022-01-13 DIAGNOSIS — B948 Sequelae of other specified infectious and parasitic diseases: Secondary | ICD-10-CM | POA: Diagnosis not present

## 2022-01-13 DIAGNOSIS — G9339 Other post infection and related fatigue syndromes: Secondary | ICD-10-CM | POA: Diagnosis not present

## 2022-01-13 DIAGNOSIS — R5383 Other fatigue: Secondary | ICD-10-CM | POA: Insufficient documentation

## 2022-01-13 DIAGNOSIS — U099 Post covid-19 condition, unspecified: Secondary | ICD-10-CM | POA: Insufficient documentation

## 2022-01-13 NOTE — Progress Notes (Signed)
Daily Session Note  Patient Details  Name: Todd Armstrong MRN: [id number] Date of Birth: Aug 16, 1931 Referring Provider:   Flowsheet Row Pulmonary Rehab from 10/27/2021 in [facility] Cardiac and Pulmonary Rehab  Referring Provider Lavone Orn MD       Encounter Date: 01/13/2022  Check In:  Session Check In - 01/13/22 0911       Check-In   Supervising physician immediately available to respond to emergencies See telemetry face sheet for immediately available ER MD    Location [facility]-Cardiac & Pulmonary Rehab    Staff Present Birdie Sons, MPA, Elveria Rising, BA, ACSM CEP, Exercise Physiologist;Joseph Tessie Fass, [place]    Virtual Visit No    Medication changes reported     No    Fall or balance concerns reported    No    Tobacco Cessation No Change    Warm-up and Cool-down Performed on first and last piece of equipment    Resistance Training Performed Yes    VAD Patient? No    PAD/SET Patient? No      Pain Assessment   Currently in Pain? No/denies                Social History   Tobacco Use  Smoking Status Former   Packs/day: 1.00   Years: 8.00   Pack years: 8.00   Types: Cigarettes   Quit date: 09/12/1968   Years since quitting: 53.3  Smokeless Tobacco Never  Tobacco Comments   quit smoking cigarettes > 40 years ago    Goals Met:  Independence with exercise equipment Exercise tolerated well No report of concerns or symptoms today Strength training completed today  Goals Unmet:  Not Applicable  Comments: Pt able to follow exercise prescription today without complaint.  Will continue to monitor for progression.    Dr. Emily Filbert is Medical Director for [place].  Dr. Ottie Glazier is Medical Director for [facility] Pulmonary Rehabilitation.

## 2022-01-21 ENCOUNTER — Other Ambulatory Visit: Payer: Self-pay

## 2022-01-21 DIAGNOSIS — R5383 Other fatigue: Secondary | ICD-10-CM | POA: Diagnosis not present

## 2022-01-21 DIAGNOSIS — U099 Post covid-19 condition, unspecified: Secondary | ICD-10-CM | POA: Diagnosis not present

## 2022-01-21 DIAGNOSIS — G9339 Other post infection and related fatigue syndromes: Secondary | ICD-10-CM | POA: Diagnosis not present

## 2022-01-21 DIAGNOSIS — B948 Sequelae of other specified infectious and parasitic diseases: Secondary | ICD-10-CM | POA: Diagnosis not present

## 2022-01-21 DIAGNOSIS — J439 Emphysema, unspecified: Secondary | ICD-10-CM

## 2022-01-21 NOTE — Progress Notes (Signed)
Daily Session Note  Patient Details  Name: NOA CONSTANTE MRN: [id number] Date of Birth: 18-Jul-1931 Referring Provider:   Flowsheet Row Pulmonary Rehab from 10/27/2021 in [facility] Cardiac and Pulmonary Rehab  Referring Provider Lavone Orn MD       Encounter Date: 01/21/2022  Check In:  Session Check In - 01/21/22 0913       Check-In   Supervising physician immediately available to respond to emergencies See telemetry face sheet for immediately available ER MD    Location [facility]-Cardiac & Pulmonary Rehab    Staff Present Birdie Sons, MPA, Elveria Rising, BA, ACSM CEP, Exercise Physiologist;Evalina Tabak Amedeo Plenty, BS, ACSM CEP, Exercise Physiologist    Virtual Visit No    Medication changes reported     No    Fall or balance concerns reported    No    Tobacco Cessation No Change    Warm-up and Cool-down Performed on first and last piece of equipment    Resistance Training Performed Yes    VAD Patient? No    PAD/SET Patient? No      Pain Assessment   Currently in Pain? No/denies                Social History   Tobacco Use  Smoking Status Former   Packs/day: 1.00   Years: 8.00   Pack years: 8.00   Types: Cigarettes   Quit date: 09/12/1968   Years since quitting: 53.3  Smokeless Tobacco Never  Tobacco Comments   quit smoking cigarettes > 40 years ago    Goals Met:  Independence with exercise equipment Exercise tolerated well No report of concerns or symptoms today Strength training completed today  Goals Unmet:  Not Applicable  Comments: Pt able to follow exercise prescription today without complaint.  Will continue to monitor for progression.    Dr. Emily Filbert is Medical Director for [place].  Dr. Ottie Glazier is Medical Director for [facility] Pulmonary Rehabilitation.

## 2022-01-22 ENCOUNTER — Encounter: Payer: Medicare Other | Admitting: *Deleted

## 2022-01-22 ENCOUNTER — Other Ambulatory Visit: Payer: Self-pay

## 2022-01-22 DIAGNOSIS — J439 Emphysema, unspecified: Secondary | ICD-10-CM | POA: Diagnosis not present

## 2022-01-22 DIAGNOSIS — B948 Sequelae of other specified infectious and parasitic diseases: Secondary | ICD-10-CM | POA: Diagnosis not present

## 2022-01-22 DIAGNOSIS — U099 Post covid-19 condition, unspecified: Secondary | ICD-10-CM | POA: Diagnosis not present

## 2022-01-22 DIAGNOSIS — R5383 Other fatigue: Secondary | ICD-10-CM | POA: Diagnosis not present

## 2022-01-22 DIAGNOSIS — G9339 Other post infection and related fatigue syndromes: Secondary | ICD-10-CM | POA: Diagnosis not present

## 2022-01-22 NOTE — Progress Notes (Signed)
Daily Session Note  Patient Details  Name: Todd Armstrong MRN: [id number] Date of Birth: [birth year] Referring Provider:   Flowsheet Row Pulmonary Rehab from 10/27/2021 in [facility] Cardiac and Pulmonary Rehab  Referring Provider Lavone Orn MD       Encounter Date: 01/22/2022  Check In:  Session Check In - 01/22/22 0917       Check-In   Supervising physician immediately available to respond to emergencies See telemetry face sheet for immediately available ER MD    Location [facility]-Cardiac & Pulmonary Rehab    Staff Present Renita Papa, RN BSN;Joseph Maish Vaya, RCP,RRT,BSRT;Jessica [place], [place], RCEP, CCRP, CCET    Virtual Visit No    Medication changes reported     No    Fall or balance concerns reported    No    Warm-up and Cool-down Performed on first and last piece of equipment    Resistance Training Performed Yes    VAD Patient? No    PAD/SET Patient? No      Pain Assessment   Currently in Pain? No/denies                Social History   Tobacco Use  Smoking Status Former   Packs/day: 1.00   Years: 8.00   Pack years: 8.00   Types: Cigarettes   Quit date: 09/12/1968   Years since quitting: 53.3  Smokeless Tobacco Never  Tobacco Comments   quit smoking cigarettes > 40 years ago    Goals Met:  Independence with exercise equipment Exercise tolerated well No report of concerns or symptoms today Strength training completed today  Goals Unmet:  Not Applicable  Comments: Pt able to follow exercise prescription today without complaint.  Will continue to monitor for progression.    Dr. Emily Filbert is Medical Director for [place].  Dr. Ottie Glazier is Medical Director for [facility] Pulmonary Rehabilitation.

## 2022-01-25 ENCOUNTER — Other Ambulatory Visit: Payer: Self-pay

## 2022-01-25 ENCOUNTER — Encounter: Payer: Medicare Other | Admitting: *Deleted

## 2022-01-25 DIAGNOSIS — J439 Emphysema, unspecified: Secondary | ICD-10-CM

## 2022-01-25 NOTE — Progress Notes (Signed)
Daily Session Note  Patient Details  Name: Todd Armstrong MRN: [id number] Date of Birth: December 03, 1931 Referring Provider:   Flowsheet Row Pulmonary Rehab from 10/27/2021 in [facility] Cardiac and Pulmonary Rehab  Referring Provider Lavone Orn MD       Encounter Date: 01/25/2022  Check In:  Session Check In - 01/25/22 0934       Check-In   Supervising physician immediately available to respond to emergencies See telemetry face sheet for immediately available ER MD    Location [facility]-Cardiac & Pulmonary Rehab    Staff Present Heath Lark, RN, BSN, Laveda Norman, BS, ACSM CEP, Exercise Physiologist;Amanda Oletta Darter, [place], ACSM CEP, Exercise Physiologist    Virtual Visit No    Medication changes reported     No    Fall or balance concerns reported    No    Warm-up and Cool-down Performed on first and last piece of equipment    Resistance Training Performed Yes    VAD Patient? No    PAD/SET Patient? No      Pain Assessment   Currently in Pain? No/denies                Social History   Tobacco Use  Smoking Status Former   Packs/day: 1.00   Years: 8.00   Pack years: 8.00   Types: Cigarettes   Quit date: 09/12/1968   Years since quitting: 53.4  Smokeless Tobacco Never  Tobacco Comments   quit smoking cigarettes > 40 years ago    Goals Met:  Proper associated with RPD/PD & O2 Sat Independence with exercise equipment Exercise tolerated well No report of concerns or symptoms today  Goals Unmet:  Not Applicable  Comments: Pt able to follow exercise prescription today without complaint.  Will continue to monitor for progression.    Dr. Emily Filbert is Medical Director for [place].  Dr. Ottie Glazier is Medical Director for [facility] Pulmonary Rehabilitation.

## 2022-01-27 ENCOUNTER — Other Ambulatory Visit: Payer: Self-pay

## 2022-01-27 VITALS — Ht 65.5 in | Wt 142.2 lb

## 2022-01-27 DIAGNOSIS — G9339 Other post infection and related fatigue syndromes: Secondary | ICD-10-CM | POA: Diagnosis not present

## 2022-01-27 DIAGNOSIS — U099 Post covid-19 condition, unspecified: Secondary | ICD-10-CM | POA: Diagnosis not present

## 2022-01-27 DIAGNOSIS — R5383 Other fatigue: Secondary | ICD-10-CM | POA: Diagnosis not present

## 2022-01-27 DIAGNOSIS — L309 Dermatitis, unspecified: Secondary | ICD-10-CM | POA: Diagnosis not present

## 2022-01-27 DIAGNOSIS — J439 Emphysema, unspecified: Secondary | ICD-10-CM

## 2022-01-27 DIAGNOSIS — Z85828 Personal history of other malignant neoplasm of skin: Secondary | ICD-10-CM | POA: Diagnosis not present

## 2022-01-27 DIAGNOSIS — L570 Actinic keratosis: Secondary | ICD-10-CM | POA: Diagnosis not present

## 2022-01-27 DIAGNOSIS — B948 Sequelae of other specified infectious and parasitic diseases: Secondary | ICD-10-CM | POA: Diagnosis not present

## 2022-01-27 NOTE — Progress Notes (Signed)
Daily Session Note  Patient Details  Name: Todd Armstrong MRN: [id number] Date of Birth: 02/20/1931 Referring Provider:   Flowsheet Row Pulmonary Rehab from 10/27/2021 in [facility] Cardiac and Pulmonary Rehab  Referring Provider Lavone Orn MD       Encounter Date: 01/27/2022  Check In:  Session Check In - 01/27/22 0921       Check-In   Supervising physician immediately available to respond to emergencies See telemetry face sheet for immediately available ER MD    Location [facility]-Cardiac & Pulmonary Rehab    Staff Present Birdie Sons, MPA, Elveria Rising, BA, ACSM CEP, Exercise Physiologist;Joseph Tessie Fass, [place]    Virtual Visit No    Medication changes reported     No    Fall or balance concerns reported    No    Tobacco Cessation No Change    Warm-up and Cool-down Performed on first and last piece of equipment    Resistance Training Performed Yes    VAD Patient? No    PAD/SET Patient? No      Pain Assessment   Currently in Pain? No/denies                Social History   Tobacco Use  Smoking Status Former   Packs/day: 1.00   Years: 8.00   Pack years: 8.00   Types: Cigarettes   Quit date: 09/12/1968   Years since quitting: 53.4  Smokeless Tobacco Never  Tobacco Comments   quit smoking cigarettes > 40 years ago    Goals Met:  Independence with exercise equipment Exercise tolerated well No report of concerns or symptoms today Strength training completed today  Goals Unmet:  Not Applicable  Comments: Pt able to follow exercise prescription today without complaint.  Will continue to monitor for progression.    Dr. Emily Filbert is Medical Director for [place].  Dr. Ottie Glazier is Medical Director for [facility] Pulmonary Rehabilitation.

## 2022-01-28 NOTE — Patient Instructions (Signed)
Discharge Patient Instructions  Patient Details  Name: Todd Armstrong MRN: [id number] Date of Birth: Feb 02, 1931 Referring Provider:  Lavone Orn, MD   Number of Visits: 36  Reason for Discharge:  Patient reached a stable level of exercise. Patient independent in their exercise. Patient has met program and personal goals.  Smoking History:  Social History   Tobacco Use  Smoking Status Former   Packs/day: 1.00   Years: 8.00   Pack years: 8.00   Types: Cigarettes   Quit date: 09/12/1968   Years since quitting: 53.4  Smokeless Tobacco Never  Tobacco Comments   quit smoking cigarettes > 40 years ago    Diagnosis:  Pulmonary emphysema, unspecified emphysema type ([place])  Initial Exercise Prescription:  Initial Exercise Prescription - 10/27/21 1400       Date of Initial Exercise RX and Referring Provider   Date 10/27/21    Referring Provider Lavone Orn MD      Oxygen   Maintain Oxygen Saturation 88% or higher      Recumbant Bike   Level 1    RPM 60    Watts 20    Minutes 15    METs 1.8      NuStep   Level 1    SPM 80    Minutes 15    METs 1.8      Track   Laps 14    Minutes 15    METs 1.76      Prescription Details   Frequency (times per week) 2    Duration Progress to 30 minutes of continuous aerobic without signs/symptoms of physical distress      Intensity   THRR 40-80% of Max Heartrate 105-123    Ratings of Perceived Exertion 11-13    Perceived Dyspnea 0-4      Progression   Progression Continue to progress workloads to maintain intensity without signs/symptoms of physical distress.      Resistance Training   Training Prescription Yes    Weight 3 lb    Reps 10-15             Discharge Exercise Prescription (Final Exercise Prescription Changes):  Exercise Prescription Changes - 01/27/22 1200       Response to Exercise   Blood Pressure (Admit) 124/72    Blood Pressure (Exit) 122/62    Heart Rate (Admit) 61 bpm    Heart Rate  (Exercise) 88 bpm    Heart Rate (Exit) 80 bpm    Oxygen Saturation (Admit) 92 %    Oxygen Saturation (Exercise) 92 %    Oxygen Saturation (Exit) 98 %    Rating of Perceived Exertion (Exercise) 11    Perceived Dyspnea (Exercise) 0    Symptoms none    Duration Continue with 30 min of aerobic exercise without signs/symptoms of physical distress.    Intensity THRR unchanged      Progression   Progression Continue to progress workloads to maintain intensity without signs/symptoms of physical distress.    Average METs 2.63      Resistance Training   Training Prescription Yes    Weight 4 lb    Reps 10-15      Interval Training   Interval Training No      Biostep-RELP   Level 3    Minutes 15    METs 2      Track   Laps 30    Minutes 15    METs 2.63      Oxygen  Maintain Oxygen Saturation 88% or higher             Functional Capacity:  6 Minute Walk     Row Name 10/27/21 1455 01/27/22 1209       6 Minute Walk   Phase Initial Discharge    Distance 955 feet 1200 feet    Distance % Change -- 25.6 %    Distance Feet Change -- 245 ft    Walk Time 6 minutes 6 minutes    # of Rest Breaks -- 0    MPH 1.8 2.3    METS 1.82 2.12    RPE 13 10    Perceived Dyspnea  0 1    VO2 Peak 6.4 7.4    Symptoms No No    Resting HR 87 bpm 72 bpm    Resting BP 138/64 128/66    Resting Oxygen Saturation  98 % 98 %    Exercise Oxygen Saturation  during 6 min walk 97 % 87 %    Max Ex. HR 109 bpm 107 bpm    Max Ex. BP 144/66 136/56    2 Minute Post BP 122/64 --      Interval HR   1 Minute HR 96 56    2 Minute HR 98 100    3 Minute HR 109 100    4 Minute HR 106 90    5 Minute HR 99 99    6 Minute HR 103 107    2 Minute Post HR 89 --    Interval Heart Rate? Yes Yes      Interval Oxygen   Interval Oxygen? Yes --    Baseline Oxygen Saturation % 98 % 98 %    1 Minute Oxygen Saturation % 96 % 87 %    1 Minute Liters of Oxygen 0 L  RA 0 L    2 Minute Oxygen Saturation % 98 % 99  %    2 Minute Liters of Oxygen 0 L 0 L    3 Minute Oxygen Saturation % 98 % 97 %    3 Minute Liters of Oxygen 0 L 0 L    4 Minute Oxygen Saturation % 98 % 87 %    4 Minute Liters of Oxygen 0 L 0 L    5 Minute Oxygen Saturation % 98 % 88 %    5 Minute Liters of Oxygen 0 L 0 L    6 Minute Oxygen Saturation % 98 % 98 %    6 Minute Liters of Oxygen 0 L 0 L    2 Minute Post Oxygen Saturation % 97 % --    2 Minute Post Liters of Oxygen 0 L 0 L             Nutrition & Weight - Outcomes:  Pre Biometrics - 10/27/21 1454       Pre Biometrics   Height 5' 6.5" (1.689 m)    Weight 136 lb 9.6 oz (62 kg)    BMI (Calculated) 21.72    Single Leg Stand 4.5 seconds             Post Biometrics - 01/27/22 1212        Post  Biometrics   Height 5' 5.5" (1.664 m)    Weight 142 lb 3.2 oz (64.5 kg)    BMI (Calculated) 23.29    Single Leg Stand 4.58 seconds             Nutrition:  Nutrition Therapy & Goals - 12/02/21 0933       Nutrition Therapy   Diet Heart healthy, low Na, pulmonary MNT    Drug/Food Interactions Statins/Certain Fruits    Protein (specify units) 75g   1.2/kg of body weight - COPD   Fiber 30 grams    Whole Grain Foods 3 servings    Saturated Fats 12 max. grams    Fruits and Vegetables 8 servings/day    Sodium 1.5 grams      Personal Nutrition Goals   Nutrition Goal ST:  add some healthy fats to meals like peanut butter which also has added protein, heart helathy oils like oilve oil to some of his vegetables, boost or ensure in between meals, or some cheese/nuts with his fruit (he enjoys grapes and oranges) LT: meet protien and calorie needs, maintain weight    Comments 88 y.o. M with T2DM, HTN, CAD, CHF, HLD, COPD admitted for pulmonary emphysemia. Relevant medications: metformin, crestor. Per MD note 11/23, pt is maintaining weight ~135-136lbs, weight as of 07/27/21 149lbs. Latest A1C as of 10/22 was 6.4. PYP 63.  He reports his weight has been stable, he  reports his energy level is good and has improved since beginning rehab.  He reports his blood sugar has been good and he checks his BG daily ~ 110-125. He feels that his appetite is good. Yesterday he had a bowl of raisin bran with banana, lunch was a cold cut sandwich with lots of vegetables, no dinner. He normally has about two meals per day. Lunch could vary and be brussels sprouts and carrots with salmon or baked pork chop. he has a garden and also freezes and cans his own foods. He reports using no fat with cooking. Drinks: water.  Encourged to add some healthy fats to meals like peanut butter which also has added protein, heart helathy oils like oilve oil to some of his vegetables, boost or ensure in between meals, or some cheese/nuts with his fruit (he enjoys grapes and oranges). Discussed increased needs with COPD and importance of meeting those needs as well as weight maintenance. Discussed heart healthy nutrition and pulmonary MNT.      Intervention Plan   Intervention Prescribe, educate and counsel regarding individualized specific dietary modifications aiming towards targeted core components such as weight, hypertension, lipid management, diabetes, heart failure and other comorbidities.    Expected Outcomes Short Term Goal: Understand basic principles of dietary content, such as calories, fat, sodium, cholesterol and nutrients.;Short Term Goal: A plan has been developed with personal nutrition goals set during dietitian appointment.;Long Term Goal: Adherence to prescribed nutrition plan.               Goals reviewed with patient; copy given to patient.

## 2022-01-29 ENCOUNTER — Other Ambulatory Visit: Payer: Self-pay

## 2022-01-29 ENCOUNTER — Encounter: Payer: Medicare Other | Admitting: *Deleted

## 2022-01-29 DIAGNOSIS — U099 Post covid-19 condition, unspecified: Secondary | ICD-10-CM | POA: Diagnosis not present

## 2022-01-29 DIAGNOSIS — R5383 Other fatigue: Secondary | ICD-10-CM | POA: Diagnosis not present

## 2022-01-29 DIAGNOSIS — J439 Emphysema, unspecified: Secondary | ICD-10-CM | POA: Diagnosis not present

## 2022-01-29 DIAGNOSIS — B948 Sequelae of other specified infectious and parasitic diseases: Secondary | ICD-10-CM | POA: Diagnosis not present

## 2022-01-29 DIAGNOSIS — G9339 Other post infection and related fatigue syndromes: Secondary | ICD-10-CM | POA: Diagnosis not present

## 2022-01-29 NOTE — Progress Notes (Signed)
Pulmonary Individual Treatment Plan  Patient Details  Name: Todd Armstrong MRN: [id number] Date of Birth: 07-19-1931 Referring Provider:   Flowsheet Row Pulmonary Rehab from 10/27/2021 in [facility] Cardiac and Pulmonary Rehab  Referring Provider Lavone Orn MD       Initial Encounter Date:  Flowsheet Row Pulmonary Rehab from 10/27/2021 in [facility] Cardiac and Pulmonary Rehab  Date 10/27/21       Visit Diagnosis: Pulmonary emphysema, unspecified emphysema type ([place])  Patient's Home Medications on Admission:  Current Outpatient Medications:    Accu-Chek FastClix Lancets MISC, For use when checking blood sugars, Disp: , Rfl:    albuterol (VENTOLIN HFA) 108 (90 Base) MCG/ACT inhaler, Inhale 2 puffs into the lungs every 6 (six) hours as needed for wheezing or shortness of breath., Disp: 8 g, Rfl: 6   apixaban (ELIQUIS) 5 MG TABS tablet, TAKE 1 TABLET BY MOUTH  TWICE DAILY, Disp: 180 tablet, Rfl: 1   clopidogrel (PLAVIX) 75 MG tablet, TAKE 1 TABLET BY MOUTH  DAILY, Disp: 90 tablet, Rfl: 3   metFORMIN (GLUCOPHAGE) 1000 MG tablet, Take 1,000 mg by mouth 2 (two) times daily with a meal., Disp: , Rfl:    metoprolol tartrate (LOPRESSOR) 25 MG tablet, Take 0.5 tablets (12.5 mg total) by mouth 2 (two) times daily., Disp: 30 tablet, Rfl: 0   nitroGLYCERIN (NITROSTAT) 0.4 MG SL tablet, Place 1 tablet (0.4 mg total) under the tongue every 5 (five) minutes as needed for chest pain., Disp: 25 tablet, Rfl: 6   potassium chloride (KLOR-CON) 10 MEQ tablet, Take 1 tablet (10 mEq total) by mouth 2 (two) times daily., Disp: 60 tablet, Rfl: 0   rosuvastatin (CRESTOR) 10 MG tablet, TAKE 1 TABLET BY MOUTH  DAILY, Disp: 90 tablet, Rfl: 2   tamsulosin (FLOMAX) 0.4 MG CAPS capsule, Take 0.4 mg by mouth at bedtime. , Disp: , Rfl:    Tiotropium Bromide Monohydrate (SPIRIVA RESPIMAT) 2.5 MCG/ACT AERS, Inhale 2 puffs into the lungs daily., Disp: 4 g, Rfl: 5   torsemide (DEMADEX) 20 MG tablet, Take 1 tablet (20 mg total)  by mouth daily., Disp: 30 tablet, Rfl: 0   triamcinolone cream (KENALOG) 0.1 %, Apply 1 application topically as needed (for itching)., Disp: , Rfl:   Past Medical History: Past Medical History:  Diagnosis Date   Acute myocardial infarction    Anemia    Aortic insufficiency    Carotid stenosis    Chronic combined systolic and diastolic CHF (congestive heart failure) (HCC)    Coronary artery disease    PTCA and stenting of his right coronary artery. 3.5 x 16-mm Liberte stent.               Diabetes mellitus without complication ([place])    Dyslipidemia    Emphysema (subcutaneous) (surgical) resulting from a procedure    Hyperlipemia    Hypertension    Mitral regurgitation    Partial tear of rotator cuff    right shoulder   Peripheral vascular disease (HCC)    Persistent atrial fibrillation ([place])    Pulmonary hypertension ([place])    Stroke ([place])    Wears dentures     Tobacco Use: Social History   Tobacco Use  Smoking Status Former   Packs/day: 1.00   Years: 8.00   Pack years: 8.00   Types: Cigarettes   Quit date: 09/12/1968   Years since quitting: 53.4  Smokeless Tobacco Never  Tobacco Comments   quit smoking cigarettes > 40 years ago  Labs: Recent Review Flowsheet Data     Labs for ITP Cardiac and Pulmonary Rehab Latest Ref Rng & Units 08/16/2018 02/19/2019 04/22/2020 06/03/2021 09/28/2021   Cholestrol 100 - 199 mg/dL 125 132 148 131 -   LDLCALC 0 - 99 mg/dL 38 37 52 38 -   HDL >39 mg/dL 66 76 75 79 -   Trlycerides 0 - 149 mg/dL 103 96 119 72 -   Hemoglobin A1c 4.8 - 5.6 % - - - - 6.3(H)        Pulmonary Assessment Scores:  Pulmonary Assessment Scores     Row Name 10/27/21 1156 01/27/22 0935       ADL UCSD   ADL Phase Entry Exit    SOB Score total 46 26    Rest 0 0    Walk 4 3    Stairs 5 2    Bath 0 0    Dress 1 0    Shop 5 2      CAT Score   CAT Score 8 6      mMRC Score   mMRC Score 2 1             UCSD: Self-administered rating of dyspnea  associated with activities of daily living (ADLs) 6-point scale (0 = "not at all" to 5 = "maximal or unable to do because of breathlessness")  Scoring Scores range from 0 to 120.  Minimally important difference is 5 units  CAT: CAT can identify the health impairment of COPD patients and is better correlated with disease progression.  CAT has a scoring range of zero to 40. The CAT score is classified into four groups of low (less than 10), medium (10 - 20), high (21-30) and very high (31-40) based on the impact level of disease on health status. A CAT score over 10 suggests significant symptoms.  A worsening CAT score could be explained by an exacerbation, poor medication adherence, poor inhaler technique, or progression of COPD or comorbid conditions.  CAT MCID is 2 points  mMRC: mMRC (Modified Medical Research Council) Dyspnea Scale is used to assess the degree of baseline functional disability in patients of respiratory disease due to dyspnea. No minimal important difference is established. A decrease in score of 1 point or greater is considered a positive change.   Pulmonary Function Assessment:  Pulmonary Function Assessment - 10/26/21 1545       Breath   Shortness of Breath Yes;Limiting activity             Exercise Target Goals: Exercise Program Goal: Individual exercise prescription set using results from initial 6 min walk test and THRR while considering  patients activity barriers and safety.   Exercise Prescription Goal: Initial exercise prescription builds to 30-45 minutes a day of aerobic activity, 2-3 days per week.  Home exercise guidelines will be given to patient during program as part of exercise prescription that the participant will acknowledge.  Education: Aerobic Exercise: - Group verbal and visual presentation on the components of exercise prescription. Introduces F.I.T.T principle from ACSM for exercise prescriptions.  Reviews F.I.T.T. principles of aerobic  exercise including progression. Written material given at graduation. Flowsheet Row Pulmonary Rehab from 01/21/2022 in [facility] Cardiac and Pulmonary Rehab  Date 12/09/21  Educator [facility]  Instruction Review Code 1- Verbalizes Understanding       Education: Resistance Exercise: - Group verbal and visual presentation on the components of exercise prescription. Introduces F.I.T.T principle from ACSM for exercise prescriptions  Reviews  F.I.T.T. principles of resistance exercise including progression. Written material given at graduation.    Education: Exercise & Equipment Safety: - Individual verbal instruction and demonstration of equipment use and safety with use of the equipment. Flowsheet Row Pulmonary Rehab from 10/26/2021 in [facility] Cardiac and Pulmonary Rehab  Date 10/26/21  Educator [facility]  Instruction Review Code 1- Verbalizes Understanding       Education: Exercise Physiology & General Exercise Guidelines: - Group verbal and written instruction with models to review the exercise physiology of the cardiovascular system and associated critical values. Provides general exercise guidelines with specific guidelines to those with heart or lung disease.  Flowsheet Row Pulmonary Rehab from 01/21/2022 in [facility] Cardiac and Pulmonary Rehab  Education need identified 10/27/21       Education: Flexibility, Balance, Mind/Body Relaxation: - Group verbal and visual presentation with interactive activity on the components of exercise prescription. Introduces F.I.T.T principle from ACSM for exercise prescriptions. Reviews F.I.T.T. principles of flexibility and balance exercise training including progression. Also discusses the mind body connection.  Reviews various relaxation techniques to help reduce and manage stress (i.e. Deep breathing, progressive muscle relaxation, and visualization). Balance handout provided to take home. Written material given at graduation. Flowsheet Row Pulmonary Rehab from 01/21/2022 in  [facility] Cardiac and Pulmonary Rehab  Date 12/23/21  Educator [facility]  Instruction Review Code 1- Verbalizes Understanding       Activity Barriers & Risk Stratification:  Activity Barriers & Cardiac Risk Stratification - 10/27/21 1454       Activity Barriers & Cardiac Risk Stratification   Activity Barriers Assistive Device;Shortness of Breath;Deconditioning;Muscular Weakness;Other (comment)    Comments Rod in right leg             6 Minute Walk:  6 Minute Walk     Row Name 10/27/21 1455 01/27/22 1209       6 Minute Walk   Phase Initial Discharge    Distance 955 feet 1200 feet    Distance % Change -- 25.6 %    Distance Feet Change -- 245 ft    Walk Time 6 minutes 6 minutes    # of Rest Breaks -- 0    MPH 1.8 2.3    METS 1.82 2.12    RPE 13 10    Perceived Dyspnea  0 1    VO2 Peak 6.4 7.4    Symptoms No No    Resting HR 87 bpm 72 bpm    Resting BP 138/64 128/66    Resting Oxygen Saturation  98 % 98 %    Exercise Oxygen Saturation  during 6 min walk 97 % 87 %    Max Ex. HR 109 bpm 107 bpm    Max Ex. BP 144/66 136/56    2 Minute Post BP 122/64 --      Interval HR   1 Minute HR 96 56    2 Minute HR 98 100    3 Minute HR 109 100    4 Minute HR 106 90    5 Minute HR 99 99    6 Minute HR 103 107    2 Minute Post HR 89 --    Interval Heart Rate? Yes Yes      Interval Oxygen   Interval Oxygen? Yes --    Baseline Oxygen Saturation % 98 % 98 %    1 Minute Oxygen Saturation % 96 % 87 %    1 Minute Liters of Oxygen 0 L  RA 0  L    2 Minute Oxygen Saturation % 98 % 99 %    2 Minute Liters of Oxygen 0 L 0 L    3 Minute Oxygen Saturation % 98 % 97 %    3 Minute Liters of Oxygen 0 L 0 L    4 Minute Oxygen Saturation % 98 % 87 %    4 Minute Liters of Oxygen 0 L 0 L    5 Minute Oxygen Saturation % 98 % 88 %    5 Minute Liters of Oxygen 0 L 0 L    6 Minute Oxygen Saturation % 98 % 98 %    6 Minute Liters of Oxygen 0 L 0 L    2 Minute Post Oxygen Saturation % 97 % --    2  Minute Post Liters of Oxygen 0 L 0 L            Oxygen Initial Assessment:  Oxygen Initial Assessment - 10/27/21 1156       Home Oxygen   Home Oxygen Device None    Sleep Oxygen Prescription None    Home Exercise Oxygen Prescription None    Home Resting Oxygen Prescription None    Compliance with Home Oxygen Use Yes      Initial 6 min Walk   Oxygen Used None      Program Oxygen Prescription   Program Oxygen Prescription None      Intervention   Short Term Goals To learn and exhibit compliance with exercise, home and travel O2 prescription;To learn and understand importance of monitoring SPO2 with pulse oximeter and demonstrate accurate use of the pulse oximeter.;To learn and demonstrate proper pursed lip breathing techniques or other breathing techniques. ;To learn and understand importance of maintaining oxygen saturations>88%;To learn and demonstrate proper use of respiratory medications    Long  Term Goals Exhibits compliance with exercise, home  and travel O2 prescription;Verbalizes importance of monitoring SPO2 with pulse oximeter and return demonstration;Maintenance of O2 saturations>88%;Exhibits proper breathing techniques, such as pursed lip breathing or other method taught during program session;Compliance with respiratory medication;Demonstrates proper use of MDIs             Oxygen Re-Evaluation:  Oxygen Re-Evaluation     Row Name 10/29/21 1000 11/23/21 0958           Program Oxygen Prescription   Program Oxygen Prescription None None        Home Oxygen   Home Oxygen Device None None      Sleep Oxygen Prescription None None      Home Exercise Oxygen Prescription None None      Home Resting Oxygen Prescription None None      Compliance with Home Oxygen Use Yes Yes        Goals/Expected Outcomes   Short Term Goals To learn and exhibit compliance with exercise, home and travel O2 prescription;To learn and understand importance of monitoring SPO2 with  pulse oximeter and demonstrate accurate use of the pulse oximeter.;To learn and demonstrate proper pursed lip breathing techniques or other breathing techniques. ;To learn and understand importance of maintaining oxygen saturations>88% To learn and exhibit compliance with exercise, home and travel O2 prescription;To learn and understand importance of monitoring SPO2 with pulse oximeter and demonstrate accurate use of the pulse oximeter.;To learn and demonstrate proper pursed lip breathing techniques or other breathing techniques. ;To learn and understand importance of maintaining oxygen saturations>88%      Long  Term Goals Exhibits compliance with  exercise, home  and travel O2 prescription;Verbalizes importance of monitoring SPO2 with pulse oximeter and return demonstration;Maintenance of O2 saturations>88%;Exhibits proper breathing techniques, such as pursed lip breathing or other method taught during program session;Compliance with respiratory medication Exhibits compliance with exercise, home  and travel O2 prescription;Verbalizes importance of monitoring SPO2 with pulse oximeter and return demonstration;Maintenance of O2 saturations>88%;Exhibits proper breathing techniques, such as pursed lip breathing or other method taught during program session;Compliance with respiratory medication      Comments Reviewed PLB technique with pt.  Talked about how it works and it's importance in maintaining their exercise saturations. Todd Armstrong monitors sats at home and they are always baove 88%      Goals/Expected Outcomes Short: Become more profiecient at using PLB.   Long: Become independent at using PLB. ST/LT: maintain oxygen above 88% at all times/use PLB as needed               Oxygen Discharge (Final Oxygen Re-Evaluation):  Oxygen Re-Evaluation - 11/23/21 0958       Program Oxygen Prescription   Program Oxygen Prescription None      Home Oxygen   Home Oxygen Device None    Sleep Oxygen Prescription  None    Home Exercise Oxygen Prescription None    Home Resting Oxygen Prescription None    Compliance with Home Oxygen Use Yes      Goals/Expected Outcomes   Short Term Goals To learn and exhibit compliance with exercise, home and travel O2 prescription;To learn and understand importance of monitoring SPO2 with pulse oximeter and demonstrate accurate use of the pulse oximeter.;To learn and demonstrate proper pursed lip breathing techniques or other breathing techniques. ;To learn and understand importance of maintaining oxygen saturations>88%    Long  Term Goals Exhibits compliance with exercise, home  and travel O2 prescription;Verbalizes importance of monitoring SPO2 with pulse oximeter and return demonstration;Maintenance of O2 saturations>88%;Exhibits proper breathing techniques, such as pursed lip breathing or other method taught during program session;Compliance with respiratory medication    Comments Todd Armstrong monitors sats at home and they are always baove 88%    Goals/Expected Outcomes ST/LT: maintain oxygen above 88% at all times/use PLB as needed             Initial Exercise Prescription:  Initial Exercise Prescription - 10/27/21 1400       Date of Initial Exercise RX and Referring Provider   Date 10/27/21    Referring Provider Lavone Orn MD      Oxygen   Maintain Oxygen Saturation 88% or higher      Recumbant Bike   Level 1    RPM 60    Watts 20    Minutes 15    METs 1.8      NuStep   Level 1    SPM 80    Minutes 15    METs 1.8      Track   Laps 14    Minutes 15    METs 1.76      Prescription Details   Frequency (times per week) 2    Duration Progress to 30 minutes of continuous aerobic without signs/symptoms of physical distress      Intensity   THRR 40-80% of Max Heartrate 105-123    Ratings of Perceived Exertion 11-13    Perceived Dyspnea 0-4      Progression   Progression Continue to progress workloads to maintain intensity without  signs/symptoms of physical distress.      Resistance  Training   Training Prescription Yes    Weight 3 lb    Reps 10-15             Perform Capillary Blood Glucose checks as needed.  Exercise Prescription Changes:   Exercise Prescription Changes     Row Name 10/27/21 1400 11/04/21 0900 11/16/21 1200 12/01/21 1200 12/15/21 0800     Response to Exercise   Blood Pressure (Admit) 138/64 112/60 104/58 128/62 122/64   Blood Pressure (Exercise) 144/66 144/72 122/70 144/60 --   Blood Pressure (Exit) 122/64 118/62 108/60 118/62 128/70   Heart Rate (Admit) 87 bpm 65 bpm 71 bpm 79 bpm 82 bpm   Heart Rate (Exercise) 109 bpm 112 bpm 104 bpm 110 bpm 99 bpm   Heart Rate (Exit) 89 bpm 102 bpm 100 bpm 90 bpm 85 bpm   Oxygen Saturation (Admit) 98 % 97 % 90 % 95 % 98 %   Oxygen Saturation (Exercise) 97 % 92 % 90 % 94 % 92 %   Oxygen Saturation (Exit) 97 % 98 % 97 % 98 % 98 %   Rating of Perceived Exertion (Exercise) _0 Perceived Dyspnea (Exercise) 0 1 1 0 0   Symptoms _1    Comments walk test results third session -- -- --   Duration -- Continue with 30 min of aerobic exercise without signs/symptoms of physical distress. Continue with 30 min of aerobic exercise without signs/symptoms of physical distress. Continue with 30 min of aerobic exercise without signs/symptoms of physical distress. Continue with 30 min of aerobic exercise without signs/symptoms of physical distress.   Intensity -- THRR unchanged THRR unchanged THRR unchanged THRR unchanged     Progression   Progression -- Continue to progress workloads to maintain intensity without signs/symptoms of physical distress. Continue to progress workloads to maintain intensity without signs/symptoms of physical distress. Continue to progress workloads to maintain intensity without signs/symptoms of physical distress. Continue to progress workloads to maintain intensity without signs/symptoms of physical distress.    Average METs -- 1.4 3 2.75 2.7     Resistance Training   Training Prescription -- Yes Yes Yes Yes   Weight -- 3 lb 3 lb 3 lb 3 lb   Reps -- 10-15 10-15 10-15 --     Interval Training   Interval Training -- -- No No No     Recumbant Bike   Level -- -- -- -- 2   Watts -- -- -- -- 25   Minutes -- -- -- -- 15   METs -- -- -- -- 3.25     NuStep   Level -- -- -- -- 3   Minutes -- -- -- -- 15   METs -- -- -- -- 3.3     Arm Ergometer   Level -- 1 1 -- 1   Minutes -- 15 15 -- 15   METs -- 1 -- -- 1.9     T5 Nustep   Level -- -- -- -- 3   Minutes -- -- -- -- 15   METs -- -- -- -- 2.1     Biostep-RELP   Level -- -- 2 3 --   Minutes -- -- 15 15 --   METs -- -- 3 3 --     Track   Laps -- 14 38 27 27   Minutes -- _2 METs -- 1.76 3.07 2.47 2.47     Oxygen  Maintain Oxygen Saturation -- -- 88% or higher 88% or higher 88% or higher    Row Name 12/29/21 1200 01/12/22 1600 01/27/22 1200         Response to Exercise   Blood Pressure (Admit) 122/64 122/68 124/72     Blood Pressure (Exit) 114/62 110/58 122/62     Heart Rate (Admit) 78 bpm 68 bpm 61 bpm     Heart Rate (Exercise) 105 bpm 100 bpm 88 bpm     Heart Rate (Exit) 80 bpm 84 bpm 80 bpm     Oxygen Saturation (Admit) 94 % 92 % 92 %     Oxygen Saturation (Exercise) 90 % 88 % 92 %     Oxygen Saturation (Exit) 98 % 98 % 98 %     Rating of Perceived Exertion (Exercise) _0 Perceived Dyspnea (Exercise) 0 0 0     Symptoms none none none     Duration Continue with 30 min of aerobic exercise without signs/symptoms of physical distress. Continue with 30 min of aerobic exercise without signs/symptoms of physical distress. Continue with 30 min of aerobic exercise without signs/symptoms of physical distress.     Intensity THRR unchanged THRR unchanged THRR unchanged       Progression   Progression Continue to progress workloads to maintain intensity without signs/symptoms of physical distress. Continue to  progress workloads to maintain intensity without signs/symptoms of physical distress. Continue to progress workloads to maintain intensity without signs/symptoms of physical distress.     Average METs 2.8 3.71 2.63       Resistance Training   Training Prescription Yes Yes Yes     Weight 3 lb 3 lb 4 lb     Reps 10-15 10-15 10-15       Interval Training   Interval Training No No No       Treadmill   MPH -- 1.6 --     Grade -- 0.5 --     Minutes -- 15 --     METs -- 2.32 --       Recumbant Bike   Level -- 4 --     Minutes -- 15 --       REL-XR   Level -- 7 --     Minutes -- 15 --     METs -- 5.8 --       T5 Nustep   Level -- 5 --     Minutes -- 15 --     METs -- 3 --       Biostep-RELP   Level 3 -- 3     Minutes 15 -- 15     METs 3 -- 2       Track   Laps 30 -- 30     Minutes 15 -- 15     METs 2.63 -- 2.63       Oxygen   Maintain Oxygen Saturation 88% or higher 88% or higher 88% or higher              Exercise Comments:   Exercise Comments     Row Name 10/29/21 7654           Exercise Comments First full day of exercise!  Patient was oriented to gym and equipment including functions, settings, policies, and procedures.  Patient's individual exercise prescription and treatment plan were reviewed.  All starting workloads were established based on the results of the 6 minute walk test  done at initial orientation visit.  The plan for exercise progression was also introduced and progression will be customized based on patient's performance and goals.                Exercise Goals and Review:   Exercise Goals     Row Name 10/27/21 1501             Exercise Goals   Increase Physical Activity Yes       Intervention Provide advice, education, support and counseling about physical activity/exercise needs.;Develop an individualized exercise prescription for aerobic and resistive training based on initial evaluation findings, risk stratification,  comorbidities and participant's personal goals.       Expected Outcomes Short Term: Attend rehab on a regular basis to increase amount of physical activity.;Long Term: Add in home exercise to make exercise part of routine and to increase amount of physical activity.;Long Term: Exercising regularly at least 3-5 days a week.       Increase Strength and Stamina Yes       Intervention Provide advice, education, support and counseling about physical activity/exercise needs.;Develop an individualized exercise prescription for aerobic and resistive training based on initial evaluation findings, risk stratification, comorbidities and participant's personal goals.       Expected Outcomes Short Term: Increase workloads from initial exercise prescription for resistance, speed, and METs.;Short Term: Perform resistance training exercises routinely during rehab and add in resistance training at home;Long Term: Improve cardiorespiratory fitness, muscular endurance and strength as measured by increased METs and functional capacity (6MWT)       Able to understand and use rate of perceived exertion (RPE) scale Yes       Intervention Provide education and explanation on how to use RPE scale       Expected Outcomes Short Term: Able to use RPE daily in rehab to express subjective intensity level;Long Term:  Able to use RPE to guide intensity level when exercising independently       Able to understand and use Dyspnea scale Yes       Intervention Provide education and explanation on how to use Dyspnea scale       Expected Outcomes Short Term: Able to use Dyspnea scale daily in rehab to express subjective sense of shortness of breath during exertion;Long Term: Able to use Dyspnea scale to guide intensity level when exercising independently       Knowledge and understanding of Target Heart Rate Range (THRR) Yes       Intervention Provide education and explanation of THRR including how the numbers were predicted and where they  are located for reference       Expected Outcomes Short Term: Able to state/look up THRR;Long Term: Able to use THRR to govern intensity when exercising independently;Short Term: Able to use daily as guideline for intensity in rehab       Able to check pulse independently Yes       Intervention Provide education and demonstration on how to check pulse in carotid and radial arteries.;Review the importance of being able to check your own pulse for safety during independent exercise       Expected Outcomes Short Term: Able to explain why pulse checking is important during independent exercise;Long Term: Able to check pulse independently and accurately       Understanding of Exercise Prescription Yes       Intervention Provide education, explanation, and written materials on patient's individual exercise prescription  Expected Outcomes Short Term: Able to explain program exercise prescription;Long Term: Able to explain home exercise prescription to exercise independently                Exercise Goals Re-Evaluation :  Exercise Goals Re-Evaluation     Row Name 10/29/21 2458 11/04/21 0903 11/16/21 1212 11/23/21 0950 12/01/21 1258     Exercise Goal Re-Evaluation   Exercise Goals Review Increase Physical Activity;Able to understand and use rate of perceived exertion (RPE) scale;Knowledge and understanding of Target Heart Rate Range (THRR);Understanding of Exercise Prescription;Increase Strength and Stamina;Able to understand and use Dyspnea scale;Able to check pulse independently Increase Physical Activity;Increase Strength and Stamina Increase Physical Activity;Increase Strength and Stamina;Understanding of Exercise Prescription Increase Physical Activity;Increase Strength and Stamina Increase Physical Activity;Increase Strength and Stamina   Comments Reviewed RPE and dyspnea scales, THR and program prescription with pt today.  Pt voiced understanding and was given a copy of goals to take home.  Todd Armstrong is tolerating exercise well so far.  Oxygen has stayed in the 90s and he reaches THR range Pt has been out since 11/04/21 with appointment and being out of town.  They also mentioned that they may need to change his class schedule again.  He is on level 2 for the BioStep and walkin 38 laps. Reviewed home exercise with pt today.  Pt plans to use home equipment for exercise.  Reviewed THR, pulse, RPE, sign and symptoms, pulse oximetery and when to call 911 or MD.  Also discussed weather considerations and indoor options.  Pt voiced understanding. --   Expected Outcomes Short: Use RPE daily to regulate intensity. Long: Follow program prescription in THR. Short: attend consistently Long:  build overall stamina Short: return to regulay attendance Long: Continue to improve stamina Short:monitor HR/O2 when exercising at home Long:maintain exercise independently --    Row Name 12/01/21 1300 12/15/21 0843 12/21/21 0929 12/29/21 1248 01/12/22 1600     Exercise Goal Re-Evaluation   Exercise Goals Review -- Increase Physical Activity;Increase Strength and Stamina Increase Physical Activity;Increase Strength and Stamina Increase Physical Activity;Increase Strength and Stamina Increase Physical Activity;Increase Strength and Stamina;Understanding of Exercise Prescription   Comments Todd Armstrong is tolerating exercise well.  His RPE is 9 and he is not reaching THR range so staff will encourage increasing levels to RPE 11-13 or THR range Todd Armstrong is doing well in rehab. He has increased to level 3 on the T4 Nustep and level 2 on the recumbant bike. His oxygen levels are staying above 88% during exercise and is maintaining RPEs focused more in the range of 11-13. He would benefit working up number of laps on the track. Will continue to monitor. Todd Armstrong does his health rider for 90 minutes on days not at [facility].  He does not check O2 or HR while exercising.  We reviewed the importance of checking those during exercise.  He has  a-fib so HR may fluctuate.  Staff advised calling his Dr if HR gets above high of 123 during exercise.  He sees Dr Thursday.  He is up to 30 laps on the track. Todd Armstrong has worked up to 30 laps on the track and level 3 on Biostep.  Staff will encourage trying 4 lb for strength work. Todd Armstrong continues to do well in rehab.  He is up to 1.6 mph on the treadmill and level 7 on the XR.  We will continue to monitor his progress.   Expected Outcomes Short: reach correct RPE/HR Long: improve overall stamina Short:  Increase number of laps on track progressively Long: Continue to increase overall MET level Short: monitor Hr /O2 at home follow up with Dr about HR/afib Long: continue to exercise at home Short: try 4 lb Long:  continue to build stamina Short: Consistency on workloads Long: Continue to improve stamina    Row Name 01/21/22 0927 01/27/22 1249 01/27/22 1250         Exercise Goal Re-Evaluation   Exercise Goals Review Increase Physical Activity;Increase Strength and Stamina;Understanding of Exercise Prescription Increase Physical Activity;Increase Strength and Stamina --     Comments Todd Armstrong is doing well. He is still working on his health rider for 2 bouts of 15 minutes at a time. He also has been using handweights as well and using the same exercises used from the exercie packet given by the staff here. We encouraged him to try to exercise the full 30 minutes at 1 time, although explained that splitting up exercise is still better than none. Patient is checking HR but not during exercise and he was highly encouraged to check it during exercise due to his A-fib. We talked about getting a pulse-ox. Todd Armstrong is nearing graduation.  he imporved post walk by 245 feet!     Expected Outcomes Short: Check HR during exercise Long: Continue to exercise independently at home at appropriate prescription -- ST/LT: maintain exercise on his own              Discharge Exercise Prescription (Final Exercise  Prescription Changes):  Exercise Prescription Changes - 01/27/22 1200       Response to Exercise   Blood Pressure (Admit) 124/72    Blood Pressure (Exit) 122/62    Heart Rate (Admit) 61 bpm    Heart Rate (Exercise) 88 bpm    Heart Rate (Exit) 80 bpm    Oxygen Saturation (Admit) 92 %    Oxygen Saturation (Exercise) 92 %    Oxygen Saturation (Exit) 98 %    Rating of Perceived Exertion (Exercise) 11    Perceived Dyspnea (Exercise) 0    Symptoms none    Duration Continue with 30 min of aerobic exercise without signs/symptoms of physical distress.    Intensity THRR unchanged      Progression   Progression Continue to progress workloads to maintain intensity without signs/symptoms of physical distress.    Average METs 2.63      Resistance Training   Training Prescription Yes    Weight 4 lb    Reps 10-15      Interval Training   Interval Training No      Biostep-RELP   Level 3    Minutes 15    METs 2      Track   Laps 30    Minutes 15    METs 2.63      Oxygen   Maintain Oxygen Saturation 88% or higher             Nutrition:  Target Goals: Understanding of nutrition guidelines, daily intake of sodium <1536m, cholesterol <2063m calories 30% from fat and 7% or less from saturated fats, daily to have 5 or more servings of fruits and vegetables.  Education: All About Nutrition: -Group instruction provided by verbal, written material, interactive activities, discussions, models, and posters to present general guidelines for heart healthy nutrition including fat, fiber, MyPlate, the role of sodium in heart healthy nutrition, utilization of the nutrition label, and utilization of this knowledge for meal planning. Follow up email sent as well. Written  material given at graduation. Flowsheet Row Pulmonary Rehab from 01/21/2022 in [facility] Cardiac and Pulmonary Rehab  Date 10/29/21  Educator [facility]  Instruction Review Code 1- Verbalizes Understanding       Biometrics:  Pre  Biometrics - 10/27/21 1454       Pre Biometrics   Height 5' 6.5" (1.689 m)    Weight 136 lb 9.6 oz (62 kg)    BMI (Calculated) 21.72    Single Leg Stand 4.5 seconds             Post Biometrics - 01/27/22 1212        Post  Biometrics   Height 5' 5.5" (1.664 m)    Weight 142 lb 3.2 oz (64.5 kg)    BMI (Calculated) 23.29    Single Leg Stand 4.58 seconds             Nutrition Therapy Plan and Nutrition Goals:  Nutrition Therapy & Goals - 12/02/21 0933       Nutrition Therapy   Diet Heart healthy, low Na, pulmonary MNT    Drug/Food Interactions Statins/Certain Fruits    Protein (specify units) 75g   1.2/kg of body weight - COPD   Fiber 30 grams    Whole Grain Foods 3 servings    Saturated Fats 12 max. grams    Fruits and Vegetables 8 servings/day    Sodium 1.5 grams      Personal Nutrition Goals   Nutrition Goal ST:  add some healthy fats to meals like peanut butter which also has added protein, heart helathy oils like oilve oil to some of his vegetables, boost or ensure in between meals, or some cheese/nuts with his fruit (he enjoys grapes and oranges) LT: meet protien and calorie needs, maintain weight    Comments 88 y.o. M with T2DM, HTN, CAD, CHF, HLD, COPD admitted for pulmonary emphysemia. Relevant medications: metformin, crestor. Per MD note 11/23, pt is maintaining weight ~135-136lbs, weight as of 07/27/21 149lbs. Latest A1C as of 10/22 was 6.4. PYP 63.  He reports his weight has been stable, he reports his energy level is good and has improved since beginning rehab.  He reports his blood sugar has been good and he checks his BG daily ~ 110-125. He feels that his appetite is good. Yesterday he had a bowl of raisin bran with banana, lunch was a cold cut sandwich with lots of vegetables, no dinner. He normally has about two meals per day. Lunch could vary and be brussels sprouts and carrots with salmon or baked pork chop. he has a garden and also freezes and cans his own  foods. He reports using no fat with cooking. Drinks: water.  Encourged to add some healthy fats to meals like peanut butter which also has added protein, heart helathy oils like oilve oil to some of his vegetables, boost or ensure in between meals, or some cheese/nuts with his fruit (he enjoys grapes and oranges). Discussed increased needs with COPD and importance of meeting those needs as well as weight maintenance. Discussed heart healthy nutrition and pulmonary MNT.      Intervention Plan   Intervention Prescribe, educate and counsel regarding individualized specific dietary modifications aiming towards targeted core components such as weight, hypertension, lipid management, diabetes, heart failure and other comorbidities.    Expected Outcomes Short Term Goal: Understand basic principles of dietary content, such as calories, fat, sodium, cholesterol and nutrients.;Short Term Goal: A plan has been developed with personal nutrition goals set during  dietitian appointment.;Long Term Goal: Adherence to prescribed nutrition plan.             Nutrition Assessments:  MEDIFICTS Score Key: ?70 Need to make dietary changes  40-70 Heart Healthy Diet ? 40 Therapeutic Level Cholesterol Diet  Flowsheet Row Pulmonary Rehab from 01/27/2022 in [facility] Cardiac and Pulmonary Rehab  Picture Your Plate Total Score on Admission 63  Picture Your Plate Total Score on Discharge 56      Picture Your Plate Scores: <73 Unhealthy dietary pattern with much room for improvement. 41-50 Dietary pattern unlikely to meet recommendations for good health and room for improvement. 51-60 More healthful dietary pattern, with some room for improvement.  >60 Healthy dietary pattern, although there may be some specific behaviors that could be improved.   Nutrition Goals Re-Evaluation:  Nutrition Goals Re-Evaluation     [place] Name 12/21/21 0936 01/21/22 0934           Goals   Nutrition Goal -- ST:  add some healthy fats to  meals like peanut butter which also has added protein, heart helathy oils like oilve oil to some of his vegetables, boost or ensure in between meals, or some cheese/nuts with his fruit (he enjoys grapes and oranges) LT: meet protien and calorie needs, maintain weight      Comment Todd Armstrong has added peanut butter to his diet.  He has cheese or nuts with fruit.  he voiced undestanding of this keeping BG steady. Todd Armstrong does not want to use boost or ensure in between his meals as he feels like he does need to.  He denied any questions or new goals for the RD. He still eats peanut butter with snacks, especially with crackers. His weight has been maintained well.      Expected Outcome ST/LT: meet caloric and protein needs, keep BG in recommended levels Short: Continue utilizing PB with snacks for caloric requirements Long: Maintain healthy weight               Nutrition Goals Discharge (Final Nutrition Goals Re-Evaluation):  Nutrition Goals Re-Evaluation - 01/21/22 0934       Goals   Nutrition Goal ST:  add some healthy fats to meals like peanut butter which also has added protein, heart helathy oils like oilve oil to some of his vegetables, boost or ensure in between meals, or some cheese/nuts with his fruit (he enjoys grapes and oranges) LT: meet protien and calorie needs, maintain weight    Comment Todd Armstrong does not want to use boost or ensure in between his meals as he feels like he does need to.  He denied any questions or new goals for the RD. He still eats peanut butter with snacks, especially with crackers. His weight has been maintained well.    Expected Outcome Short: Continue utilizing PB with snacks for caloric requirements Long: Maintain healthy weight             Psychosocial: Target Goals: Acknowledge presence or absence of significant depression and/or stress, maximize coping skills, provide positive support system. Participant is able to verbalize types and ability to use  techniques and skills needed for reducing stress and depression.   Education: Stress, Anxiety, and Depression - Group verbal and visual presentation to define topics covered.  Reviews how body is impacted by stress, anxiety, and depression.  Also discusses healthy ways to reduce stress and to treat/manage anxiety and depression.  Written material given at graduation.   Education: Sleep Hygiene -Provides group verbal and  written instruction about how sleep can affect your health.  Define sleep hygiene, discuss sleep cycles and impact of sleep habits. Review good sleep hygiene tips.    Initial Review & Psychosocial Screening:  Initial Psych Review & Screening - 10/26/21 [place]? Yes    Comments He can look to his neighbors, family and daughter for support. He has a positive outlook on his health.      Barriers   Psychosocial barriers to participate in program There are no identifiable barriers or psychosocial needs.;The patient should benefit from training in stress management and relaxation.      Screening Interventions   Interventions To provide support and resources with identified psychosocial needs;Encouraged to exercise;Provide feedback about the scores to participant    Expected Outcomes Short Term goal: Utilizing psychosocial counselor, staff and physician to assist with identification of specific Stressors or current issues interfering with healing process. Setting desired goal for each stressor or current issue identified.;Long Term Goal: Stressors or current issues are controlled or eliminated.;Short Term goal: Identification and review with participant of any Quality of Life or Depression concerns found by scoring the questionnaire.;Long Term goal: The participant improves quality of Life and PHQ9 Scores as seen by post scores and/or verbalization of changes             Quality of Life Scores:  Scores of 19 and below usually indicate  a poorer quality of life in these areas.  A difference of  2-3 points is a clinically meaningful difference.  A difference of 2-3 points in the total score of the Quality of Life Index has been associated with significant improvement in overall quality of life, self-image, physical symptoms, and general health in studies assessing change in quality of life.  PHQ-9: Recent Review Flowsheet Data     Depression screen [facility] 2/9 01/27/2022 10/27/2021   Decreased Interest 0 0   Down, Depressed, Hopeless 0 0   PHQ - 2 Score 0 0   Altered sleeping 2 3   Tired, decreased energy 1 1   Change in appetite 2 3   Feeling bad or failure about yourself  0 0   Trouble concentrating 0 0   Moving slowly or fidgety/restless 2 0   Suicidal thoughts 0 0   PHQ-9 Score 7 7   Difficult doing work/chores Not difficult at all Not difficult at all      Interpretation of Total Score  Total Score Depression Severity:  1-4 = Minimal depression, 5-9 = Mild depression, 10-14 = Moderate depression, 15-19 = Moderately severe depression, 20-27 = Severe depression   Psychosocial Evaluation and Intervention:  Psychosocial Evaluation - 10/26/21 1548       Psychosocial Evaluation & Interventions   Interventions Encouraged to exercise with the program and follow exercise prescription;Relaxation education;Stress management education    Comments He can look to his neighbors, family and daughter for support. He has a positive outlook on his health.    Expected Outcomes Short: Start [facility] to help with mood. Long: Maintain a healthy mental state.    Continue Psychosocial Services  Follow up required by staff             Psychosocial Re-Evaluation:  Psychosocial Re-Evaluation     [place] Name 11/23/21 [phone] 12/21/21 0935 01/21/22 1607         Psychosocial Re-Evaluation   Current issues with None Identified None Identified None Identified  Comments Todd Armstrong reports no symptoms of anxiety, depression ot stress.  Todd Armstrong sleeps well and feels rested.  He reports no signs of anxiety, depression or stress. Todd Armstrong is doing well mentally. He just got back from the beach where he was fishing. He sleeps well and does not have any problems with any stress at this time. He denied any depression or anxiety or any other concerns.     Expected Outcomes Short: notify staff of any changes Long: maintain positive outlook Short: maintain current sleep and stress levels Long: stay positive Short: Maintain current attendance Long: Continue to utilize exercise for stress management and maintain positive attitude     Interventions -- -- Encouraged to attend Pulmonary Rehabilitation for the exercise     Continue Psychosocial Services  -- -- Follow up required by staff              Psychosocial Discharge (Final Psychosocial Re-Evaluation):  Psychosocial Re-Evaluation - 01/21/22 8676       Psychosocial Re-Evaluation   Current issues with None Identified    Comments Todd Armstrong is doing well mentally. He just got back from the beach where he was fishing. He sleeps well and does not have any problems with any stress at this time. He denied any depression or anxiety or any other concerns.    Expected Outcomes Short: Maintain current attendance Long: Continue to utilize exercise for stress management and maintain positive attitude    Interventions Encouraged to attend Pulmonary Rehabilitation for the exercise    Continue Psychosocial Services  Follow up required by staff             Education: Education Goals: Education classes will be provided on a weekly basis, covering required topics. Participant will state understanding/return demonstration of topics presented.  Learning Barriers/Preferences:  Learning Barriers/Preferences - 10/26/21 1545       Learning Barriers/Preferences   Learning Barriers None    Learning Preferences None             General Pulmonary Education Topics:  Infection Prevention: -  Provides verbal and written material to individual with discussion of infection control including proper hand washing and proper equipment cleaning during exercise session. Flowsheet Row Pulmonary Rehab from 10/26/2021 in [facility] Cardiac and Pulmonary Rehab  Date 10/26/21  Educator [facility]  Instruction Review Code 1- Verbalizes Understanding       Falls Prevention: - Provides verbal and written material to individual with discussion of falls prevention and safety. Flowsheet Row Pulmonary Rehab from 10/26/2021 in [facility] Cardiac and Pulmonary Rehab  Date 10/26/21  Educator [facility]  Instruction Review Code 1- Verbalizes Understanding       Chronic Lung Disease Review: - Group verbal instruction with posters, models, PowerPoint presentations and videos,  to review new updates, new respiratory medications, new advancements in procedures and treatments. Providing information on websites and "800" numbers for continued self-education. Includes information about supplement oxygen, available portable oxygen systems, continuous and intermittent flow rates, oxygen safety, concentrators, and Medicare reimbursement for oxygen. Explanation of Pulmonary Drugs, including class, frequency, complications, importance of spacers, rinsing mouth after steroid MDI's, and proper cleaning methods for nebulizers. Review of basic lung anatomy and physiology related to function, structure, and complications of lung disease. Review of risk factors. Discussion about methods for diagnosing sleep apnea and types of masks and machines for OSA. Includes a review of the use of types of environmental controls: home humidity, furnaces, filters, dust mite/pet prevention, HEPA vacuums. Discussion about weather changes, air quality and  the benefits of nasal washing. Instruction on Warning signs, infection symptoms, calling MD promptly, preventive modes, and value of vaccinations. Review of effective airway clearance, coughing and/or vibration  techniques. Emphasizing that all should Create an Action Plan. Written material given at graduation. Flowsheet Row Pulmonary Rehab from 01/21/2022 in [facility] Cardiac and Pulmonary Rehab  Education need identified 10/27/21  Date 11/18/21  Educator [facility]  Instruction Review Code 1- Verbalizes Understanding       AED/CPR: - Group verbal and written instruction with the use of models to demonstrate the basic use of the AED with the basic ABC's of resuscitation.    Anatomy and Cardiac Procedures: - Group verbal and visual presentation and models provide information about basic cardiac anatomy and function. Reviews the testing methods done to diagnose heart disease and the outcomes of the test results. Describes the treatment choices: Medical Management, Angioplasty, or Coronary Bypass Surgery for treating various heart conditions including Myocardial Infarction, Angina, Valve Disease, and Cardiac Arrhythmias.  Written material given at graduation.   Medication Safety: - Group verbal and visual instruction to review commonly prescribed medications for heart and lung disease. Reviews the medication, class of the drug, and side effects. Includes the steps to properly store meds and maintain the prescription regimen.  Written material given at graduation. Flowsheet Row Pulmonary Rehab from 01/21/2022 in [facility] Cardiac and Pulmonary Rehab  Date 12/30/21  Educator [facility]  Instruction Review Code 1- Verbalizes Understanding       Other: -Provides group and verbal instruction on various topics (see comments)   Knowledge Questionnaire Score:  Knowledge Questionnaire Score - 01/27/22 0931       Knowledge Questionnaire Score   Pre Score 12/18: Lung disease, ADL, O2, PLB    Post Score 12/18              Core Components/Risk Factors/Patient Goals at Admission:  Personal Goals and Risk Factors at Admission - 10/27/21 1502       Core Components/Risk Factors/Patient Goals on Admission    Weight  Management Yes;Weight Maintenance    Intervention Weight Management: Develop a combined nutrition and exercise program designed to reach desired caloric intake, while maintaining appropriate intake of nutrient and fiber, sodium and fats, and appropriate energy expenditure required for the weight goal.;Weight Management: Provide education and appropriate resources to help participant work on and attain dietary goals.;Weight Management/Obesity: Establish reasonable short term and long term weight goals.    Admit Weight 136 lb (61.7 kg)    Goal Weight: Short Term 136 lb (61.7 kg)    Goal Weight: Long Term 136 lb (61.7 kg)    Expected Outcomes Short Term: Continue to assess and modify interventions until short term weight is achieved;Long Term: Adherence to nutrition and physical activity/exercise program aimed toward attainment of established weight goal;Understanding recommendations for meals to include 15-35% energy as protein, 25-35% energy from fat, 35-60% energy from carbohydrates, less than 216m of dietary cholesterol, 20-35 gm of total fiber daily;Understanding of distribution of calorie intake throughout the day with the consumption of 4-5 meals/snacks;Weight Maintenance: Understanding of the daily nutrition guidelines, which includes 25-35% calories from fat, 7% or less cal from saturated fats, less than 2054mcholesterol, less than 1.5gm of sodium, & 5 or more servings of fruits and vegetables daily    Improve shortness of breath with ADL's Yes    Intervention Provide education, individualized exercise plan and daily activity instruction to help decrease symptoms of SOB with activities of daily living.  Expected Outcomes Short Term: Improve cardiorespiratory fitness to achieve a reduction of symptoms when performing ADLs;Long Term: Be able to perform more ADLs without symptoms or delay the onset of symptoms    Diabetes Yes    Intervention Provide education about signs/symptoms and action to take  for hypo/hyperglycemia.;Provide education about proper nutrition, including hydration, and aerobic/resistive exercise prescription along with prescribed medications to achieve blood glucose in normal ranges: Fasting glucose 65-99 mg/dL    Expected Outcomes Short Term: Participant verbalizes understanding of the signs/symptoms and immediate care of hyper/hypoglycemia, proper foot care and importance of medication, aerobic/resistive exercise and nutrition plan for blood glucose control.;Long Term: Attainment of HbA1C < 7%.    Hypertension Yes    Intervention Provide education on lifestyle modifcations including regular physical activity/exercise, weight management, moderate sodium restriction and increased consumption of fresh fruit, vegetables, and low fat dairy, alcohol moderation, and smoking cessation.;Monitor prescription use compliance.    Expected Outcomes Short Term: Continued assessment and intervention until BP is < 140/35m HG in hypertensive participants. < 130/85mHG in hypertensive participants with diabetes, heart failure or chronic kidney disease.;Long Term: Maintenance of blood pressure at goal levels.    Lipids Yes    Intervention Provide education and support for participant on nutrition & aerobic/resistive exercise along with prescribed medications to achieve LDL <7014mHDL >1m6m  Expected Outcomes Short Term: Participant states understanding of desired cholesterol values and is compliant with medications prescribed. Participant is following exercise prescription and nutrition guidelines.;Long Term: Cholesterol controlled with medications as prescribed, with individualized exercise RX and with personalized nutrition plan. Value goals: LDL < 70mg43mL > 40 mg.             Education:Diabetes - Individual verbal and written instruction to review signs/symptoms of diabetes, desired ranges of glucose level fasting, after meals and with exercise. Acknowledge that pre and post exercise  glucose checks will be done for 3 sessions at entry of program. Flowsheet Row Pulmonary Rehab [facility] 10[facility]26/20[facility]  Date 10/26/21  Educator J[facility] Code 1- Verbalizes Understanding       Know Your Numbers and Heart Failure: - Group verbal and visual instruction to discuss disease risk factors for cardiac and pulmonary disease and treatment options.  Reviews associated critical values for Overweight/Obesity, Hypertension, Cholesterol, and Diabetes.  Discusses basics of heart failure: signs/symptoms and treatments.  Introduces Heart Failure Zone chart for action plan for heart failure.  Written material given at graduation.   Core Components/Risk Factors/Patient Goals Review:   Goals and Risk Factor Review     Row Name 11/23/21 0928 12/21/21 0925 01/21/22 0937         Core Components/Risk Factors/Patient Goals Review   Personal Goals Review Diabetes;Improve shortness of breath with ADL's;Weight Management/Obesity Improve shortness of breath with ADL's;Diabetes;Weight Management/Obesity Improve shortness of breath with ADL's;Diabetes;Weight Management/Obesity     Review Todd Armstrong fasting BG every day.  Today it was 117.  His weight stays steady and he weighs every morning.  He says his breathing is better and he is able to do more than he could before. Todd Armstrong checks FBG daily.  It was 131 today.  His weight continues to stay steady.  He is able to work outdoors without getting out of breath.  He works until he gets tired, then rests.  He can do more than before starting to exercise. CarltJermerynot been checking his sugars lately. I encouraged to keep  checking them despite him feeling good and we discussed the importance of this. His weight is still staying stable where he only fluctuates by 1 pound at a time. He feels his breathing and strength has improved significantly since starting the program.     Expected Outcomes Short:  contnue to  monitor weight and BG Long: keep risk factors at recommended levels Short: continue to monitor BG/weight Long: maintain BG and weight at optimal levels Short: Check blood sugars at home routinely Long: Continue to manage lifestyle risk factors              Core Components/Risk Factors/Patient Goals at Discharge (Final Review):   Goals and Risk Factor Review - 01/21/22 0937       Core Components/Risk Factors/Patient Goals Review   Personal Goals Review Improve shortness of breath with ADL's;Diabetes;Weight Management/Obesity    Review Todd Armstrong has not been checking his sugars lately. I encouraged to keep checking them despite him feeling good and we discussed the importance of this. His weight is still staying stable where he only fluctuates by 1 pound at a time. He feels his breathing and strength has improved significantly since starting the program.    Expected Outcomes Short: Check blood sugars at home routinely Long: Continue to manage lifestyle risk factors             ITP Comments:  ITP Comments     Row Name 10/26/21 1543 10/27/21 1151 10/29/21 0952 11/11/21 0704 11/16/21 1212   ITP Comments Virtual Visit completed. Patient informed on EP and RD appointment and 6 Minute walk test. Patient also informed of patient health questionnaires on My Chart. Patient Verbalizes understanding. Visit diagnosis can be found in [facility] 08/20/2021. Completed 6MWT and gym orientation. Initial ITP created and sent for review to Dr. Ottie Glazier, Medical Director. First full day of exercise!  Patient was oriented to gym and equipment including functions, settings, policies, and procedures.  Patient's individual exercise prescription and treatment plan were reviewed.  All starting workloads were established based on the results of the 6 minute walk test done at initial orientation visit.  The plan for exercise progression was also introduced and progression will be customized based on patient's  performance and goals. 30 Day review completed. Medical Director ITP review done, changes made as directed, and signed approval by Medical Director.   New to program Out since 11/04/21    Row Name 12/09/21 0728 01/06/22 0858 01/29/22 0921       ITP Comments 30 Day review completed. Medical Director ITP review done, changes made as directed, and signed approval by Medical Director. 30 Day review completed. Medical Director ITP review done, changes made as directed, and signed approval by Medical Director. Todd Armstrong graduated today from  rehab with 30 sessions completed.  Details of the patient's exercise prescription and what He needs to do in order to continue the prescription and progress were discussed with patient.  Patient was given a copy of prescription and goals.  Patient verbalized understanding.  Todd Armstrong plans to continue to exercise by walking.              Comments: Discharge ITP

## 2022-01-29 NOTE — Progress Notes (Signed)
Discharge Progress Report  Patient Details  Name: Todd Armstrong MRN: [id number] Date of Birth: 06-10-1931 Referring Provider:   Flowsheet Row Pulmonary Rehab from 10/27/2021 in [facility] and Pulmonary Rehab  Referring Provider Lavone Orn MD        Number of Visits: 30  Reason for Discharge:  Patient reached a stable level of exercise. Patient independent in their exercise. Patient has met program and personal goals.  Smoking History:  Social History   Tobacco Use  Smoking Status Former   Packs/day: 1.00   Years: 8.00   Pack years: 8.00   Types: Cigarettes   Quit date: 09/12/1968   Years since quitting: 53.4  Smokeless Tobacco Never  Tobacco Comments   quit smoking cigarettes > 40 years ago    Diagnosis:  Pulmonary emphysema, unspecified emphysema type ([place])  ADL UCSD:  Pulmonary Assessment Scores     Row Name 10/27/21 1156 01/27/22 0935       ADL UCSD   ADL Phase Entry Exit    SOB Score total 46 26    Rest 0 0    Walk 4 3    Stairs 5 2    Bath 0 0    Dress 1 0    Shop 5 2      CAT Score   CAT Score 8 6      mMRC Score   mMRC Score 2 1             Initial Exercise Prescription:  Initial Exercise Prescription - 10/27/21 1400       Date of Initial Exercise RX and Referring Provider   Date 10/27/21    Referring Provider Lavone Orn MD      Oxygen   Maintain Oxygen Saturation 88% or higher      Recumbant Bike   Level 1    RPM 60    Watts 20    Minutes 15    METs 1.8      NuStep   Level 1    SPM 80    Minutes 15    METs 1.8      Track   Laps 14    Minutes 15    METs 1.76      Prescription Details   Frequency (times per week) 2    Duration Progress to 30 minutes of continuous aerobic without signs/symptoms of physical distress      Intensity   THRR 40-80% of Max Heartrate 105-123    Ratings of Perceived Exertion 11-13    Perceived Dyspnea 0-4      Progression   Progression Continue to progress workloads to  maintain intensity without signs/symptoms of physical distress.      Resistance Training   Training Prescription Yes    Weight 3 lb    Reps 10-15             Discharge Exercise Prescription (Final Exercise Prescription Changes):  Exercise Prescription Changes - 01/27/22 1200       Response to Exercise   Blood Pressure (Admit) 124/72    Blood Pressure (Exit) 122/62    Heart Rate (Admit) 61 bpm    Heart Rate (Exercise) 88 bpm    Heart Rate (Exit) 80 bpm    Oxygen Saturation (Admit) 92 %    Oxygen Saturation (Exercise) 92 %    Oxygen Saturation (Exit) 98 %    Rating of Perceived Exertion (Exercise) 11    Perceived Dyspnea (Exercise) 0  Symptoms none    Duration Continue with 30 min of aerobic exercise without signs/symptoms of physical distress.    Intensity THRR unchanged      Progression   Progression Continue to progress workloads to maintain intensity without signs/symptoms of physical distress.    Average METs 2.63      Resistance Training   Training Prescription Yes    Weight 4 lb    Reps 10-15      Interval Training   Interval Training No      Biostep-RELP   Level 3    Minutes 15    METs 2      Track   Laps 30    Minutes 15    METs 2.63      Oxygen   Maintain Oxygen Saturation 88% or higher             Functional Capacity:  6 Minute Walk     Row Name 10/27/21 1455 01/27/22 1209       6 Minute Walk   Phase Initial Discharge    Distance 955 feet 1200 feet    Distance % Change -- 25.6 %    Distance Feet Change -- 245 ft    Walk Time 6 minutes 6 minutes    # of Rest Breaks -- 0    MPH 1.8 2.3    METS 1.82 2.12    RPE 13 10    Perceived Dyspnea  0 1    VO2 Peak 6.4 7.4    Symptoms No No    Resting HR 87 bpm 72 bpm    Resting BP 138/64 128/66    Resting Oxygen Saturation  98 % 98 %    Exercise Oxygen Saturation  during 6 min walk 97 % 87 %    Max Ex. HR 109 bpm 107 bpm    Max Ex. BP 144/66 136/56    2 Minute Post BP 122/64 --       Interval HR   1 Minute HR 96 56    2 Minute HR 98 100    3 Minute HR 109 100    4 Minute HR 106 90    5 Minute HR 99 99    6 Minute HR 103 107    2 Minute Post HR 89 --    Interval Heart Rate? Yes Yes      Interval Oxygen   Interval Oxygen? Yes --    Baseline Oxygen Saturation % 98 % 98 %    1 Minute Oxygen Saturation % 96 % 87 %    1 Minute Liters of Oxygen 0 L  RA 0 L    2 Minute Oxygen Saturation % 98 % 99 %    2 Minute Liters of Oxygen 0 L 0 L    3 Minute Oxygen Saturation % 98 % 97 %    3 Minute Liters of Oxygen 0 L 0 L    4 Minute Oxygen Saturation % 98 % 87 %    4 Minute Liters of Oxygen 0 L 0 L    5 Minute Oxygen Saturation % 98 % 88 %    5 Minute Liters of Oxygen 0 L 0 L    6 Minute Oxygen Saturation % 98 % 98 %    6 Minute Liters of Oxygen 0 L 0 L    2 Minute Post Oxygen Saturation % 97 % --    2 Minute Post Liters of Oxygen 0 L 0  L             Psychological, QOL, Others - Outcomes: PHQ 2/9: Depression screen [facility] 2/9 01/27/2022 10/27/2021  Decreased Interest 0 0  Down, Depressed, Hopeless 0 0  PHQ - 2 Score 0 0  Altered sleeping 2 3  Tired, decreased energy 1 1  Change in appetite 2 3  Feeling bad or failure about yourself  0 0  Trouble concentrating 0 0  Moving slowly or fidgety/restless 2 0  Suicidal thoughts 0 0  PHQ-9 Score 7 7  Difficult doing work/chores Not difficult at all Not difficult at all    Nutrition & Weight - Outcomes:  Pre Biometrics - 10/27/21 1454       Pre Biometrics   Height 5' 6.5" (1.689 m)    Weight 136 lb 9.6 oz (62 kg)    BMI (Calculated) 21.72    Single Leg Stand 4.5 seconds             Post Biometrics - 01/27/22 1212        Post  Biometrics   Height 5' 5.5" (1.664 m)    Weight 142 lb 3.2 oz (64.5 kg)    BMI (Calculated) 23.29    Single Leg Stand 4.58 seconds             Nutrition:  Nutrition Therapy & Goals - 12/02/21 0933       Nutrition Therapy   Diet Heart healthy, low Na, pulmonary MNT     Drug/Food Interactions Statins/Certain Fruits    Protein (specify units) 75g   1.2/kg of body weight - COPD   Fiber 30 grams    Whole Grain Foods 3 servings    Saturated Fats 12 max. grams    Fruits and Vegetables 8 servings/day    Sodium 1.5 grams      Personal Nutrition Goals   Nutrition Goal ST:  add some healthy fats to meals like peanut butter which also has added protein, heart helathy oils like oilve oil to some of his vegetables, boost or ensure in between meals, or some cheese/nuts with his fruit (he enjoys grapes and oranges) LT: meet protien and calorie needs, maintain weight    Comments 88 y.o. M with T2DM, HTN, CAD, CHF, HLD, COPD admitted for pulmonary emphysemia. Relevant medications: metformin, crestor. Per MD note 11/23, pt is maintaining weight ~135-136lbs, weight as of 07/27/21 149lbs. Latest A1C as of 10/22 was 6.4. PYP 63.  He reports his weight has been stable, he reports his energy level is good and has improved since beginning rehab.  He reports his blood sugar has been good and he checks his BG daily ~ 110-125. He feels that his appetite is good. Yesterday he had a bowl of raisin bran with banana, lunch was a cold cut sandwich with lots of vegetables, no dinner. He normally has about two meals per day. Lunch could vary and be brussels sprouts and carrots with salmon or baked pork chop. he has a garden and also freezes and cans his own foods. He reports using no fat with cooking. Drinks: water.  Encourged to add some healthy fats to meals like peanut butter which also has added protein, heart helathy oils like oilve oil to some of his vegetables, boost or ensure in between meals, or some cheese/nuts with his fruit (he enjoys grapes and oranges). Discussed increased needs with COPD and importance of meeting those needs as well as weight maintenance. Discussed heart healthy nutrition and pulmonary MNT.  Intervention Plan   Intervention Prescribe, educate and counsel regarding  individualized specific dietary modifications aiming towards targeted core components such as weight, hypertension, lipid management, diabetes, heart failure and other comorbidities.    Expected Outcomes Short Term Goal: Understand basic principles of dietary content, such as calories, fat, sodium, cholesterol and nutrients.;Short Term Goal: A plan has been developed with personal nutrition goals set during dietitian appointment.;Long Term Goal: Adherence to prescribed nutrition plan.             Education Questionnaire Score:  Knowledge Questionnaire Score - 01/27/22 0931       Knowledge Questionnaire Score   Pre Score 12/18: Lung disease, ADL, O2, PLB    Post Score 12/18             Goals reviewed with patient; copy given to patient.

## 2022-01-29 NOTE — Progress Notes (Signed)
Daily Session Note  Patient Details  Name: Todd Armstrong MRN: [id number] Date of Birth: Jun 26, 1931 Referring Provider:   Flowsheet Row Pulmonary Rehab from 10/27/2021 in [facility] Cardiac and Pulmonary Rehab  Referring Provider Todd Orn MD       Encounter Date: 01/29/2022  Check In:  Session Check In - 01/29/22 0918       Check-In   Supervising physician immediately available to respond to emergencies See telemetry face sheet for immediately available ER MD    Location [facility]-Cardiac & Pulmonary Rehab    Staff Present Todd Papa, RN BSN;Todd Luan Pulling, MA, RCEP, CCRP, CCET;Todd Armstrong, [place]    Virtual Visit No    Medication changes reported     No    Fall or balance concerns reported    No    Warm-up and Cool-down Performed on first and last piece of equipment    Resistance Training Performed Yes    VAD Patient? No    PAD/SET Patient? No      Pain Assessment   Currently in Pain? No/denies                Social History   Tobacco Use  Smoking Status Former   Packs/day: 1.00   Years: 8.00   Pack years: 8.00   Types: Cigarettes   Quit date: 09/12/1968   Years since quitting: 53.4  Smokeless Tobacco Never  Tobacco Comments   quit smoking cigarettes > 40 years ago    Goals Met:  Independence with exercise equipment Exercise tolerated well No report of concerns or symptoms today Strength training completed today  Goals Unmet:  Not Applicable  Comments:  Todd Armstrong graduated today from  rehab with 30 sessions completed.  Details of the patient's exercise prescription and what He needs to do in order to continue the prescription and progress were discussed with patient.  Patient was given a copy of prescription and goals.  Patient verbalized understanding.  Todd Armstrong plans to continue to exercise by walking.    Dr. Emily Armstrong is Medical Director for [facility].  Dr. Ottie Armstrong is Medical Director for [facility] Pulmonary  Rehabilitation.

## 2022-02-01 ENCOUNTER — Ambulatory Visit: Payer: Medicare Other

## 2022-02-02 DIAGNOSIS — R490 Dysphonia: Secondary | ICD-10-CM | POA: Diagnosis not present

## 2022-02-03 ENCOUNTER — Ambulatory Visit: Payer: Medicare Other

## 2022-02-05 ENCOUNTER — Ambulatory Visit: Payer: Medicare Other

## 2022-02-08 ENCOUNTER — Ambulatory Visit: Payer: Medicare Other

## 2022-02-09 DIAGNOSIS — E0869 Diabetes mellitus due to underlying condition with other specified complication: Secondary | ICD-10-CM | POA: Diagnosis not present

## 2022-02-09 DIAGNOSIS — I10 Essential (primary) hypertension: Secondary | ICD-10-CM | POA: Diagnosis not present

## 2022-02-10 ENCOUNTER — Ambulatory Visit: Payer: Medicare Other

## 2022-02-11 IMAGING — CR DG CHEST 2V
2 series · 2 of 2 positions shown · non-contrast
Comparison: 01/26/2021

CLINICAL DATA: Shortness of breath

EXAM:
CHEST - 2 VIEW

[w chest pa]
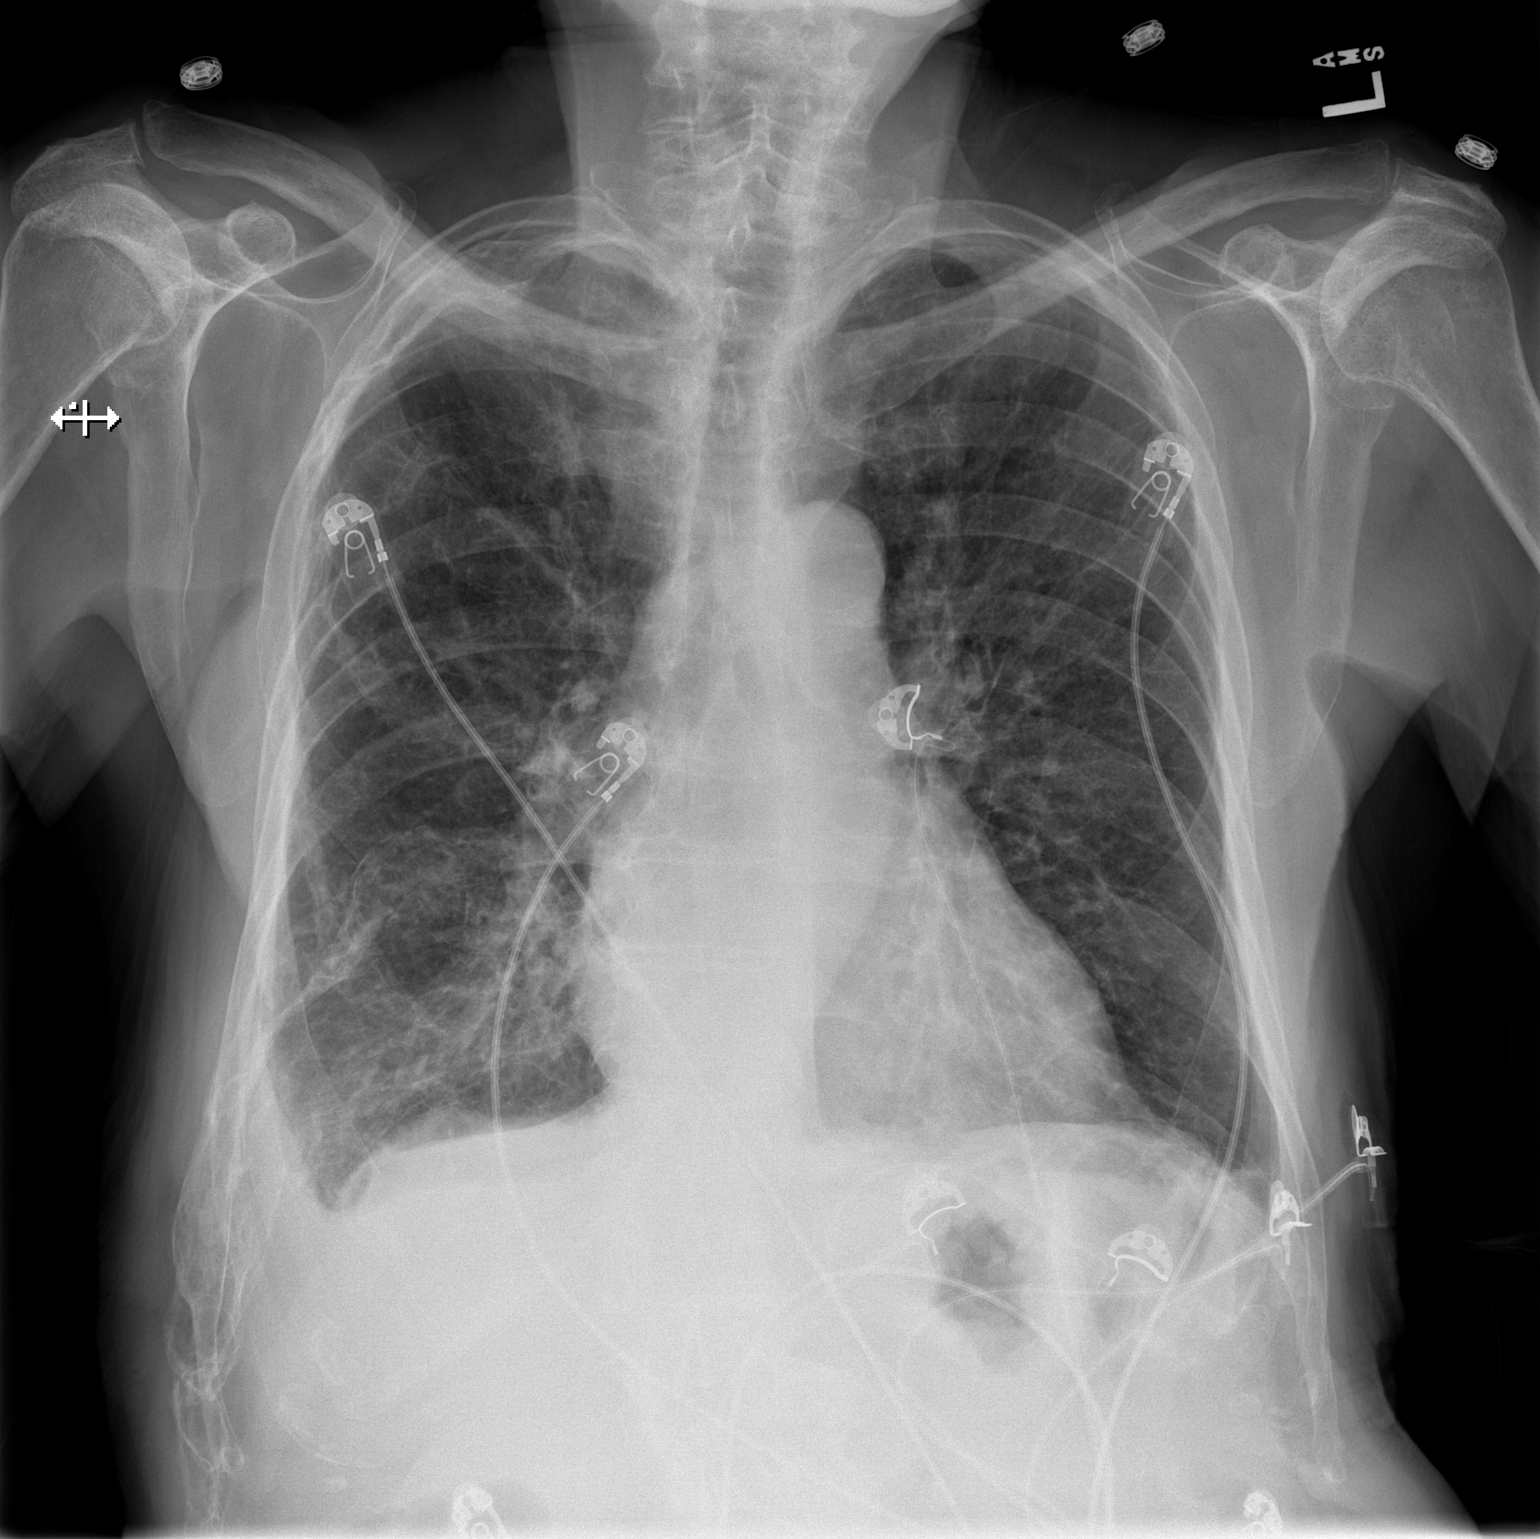

[w chest lat]
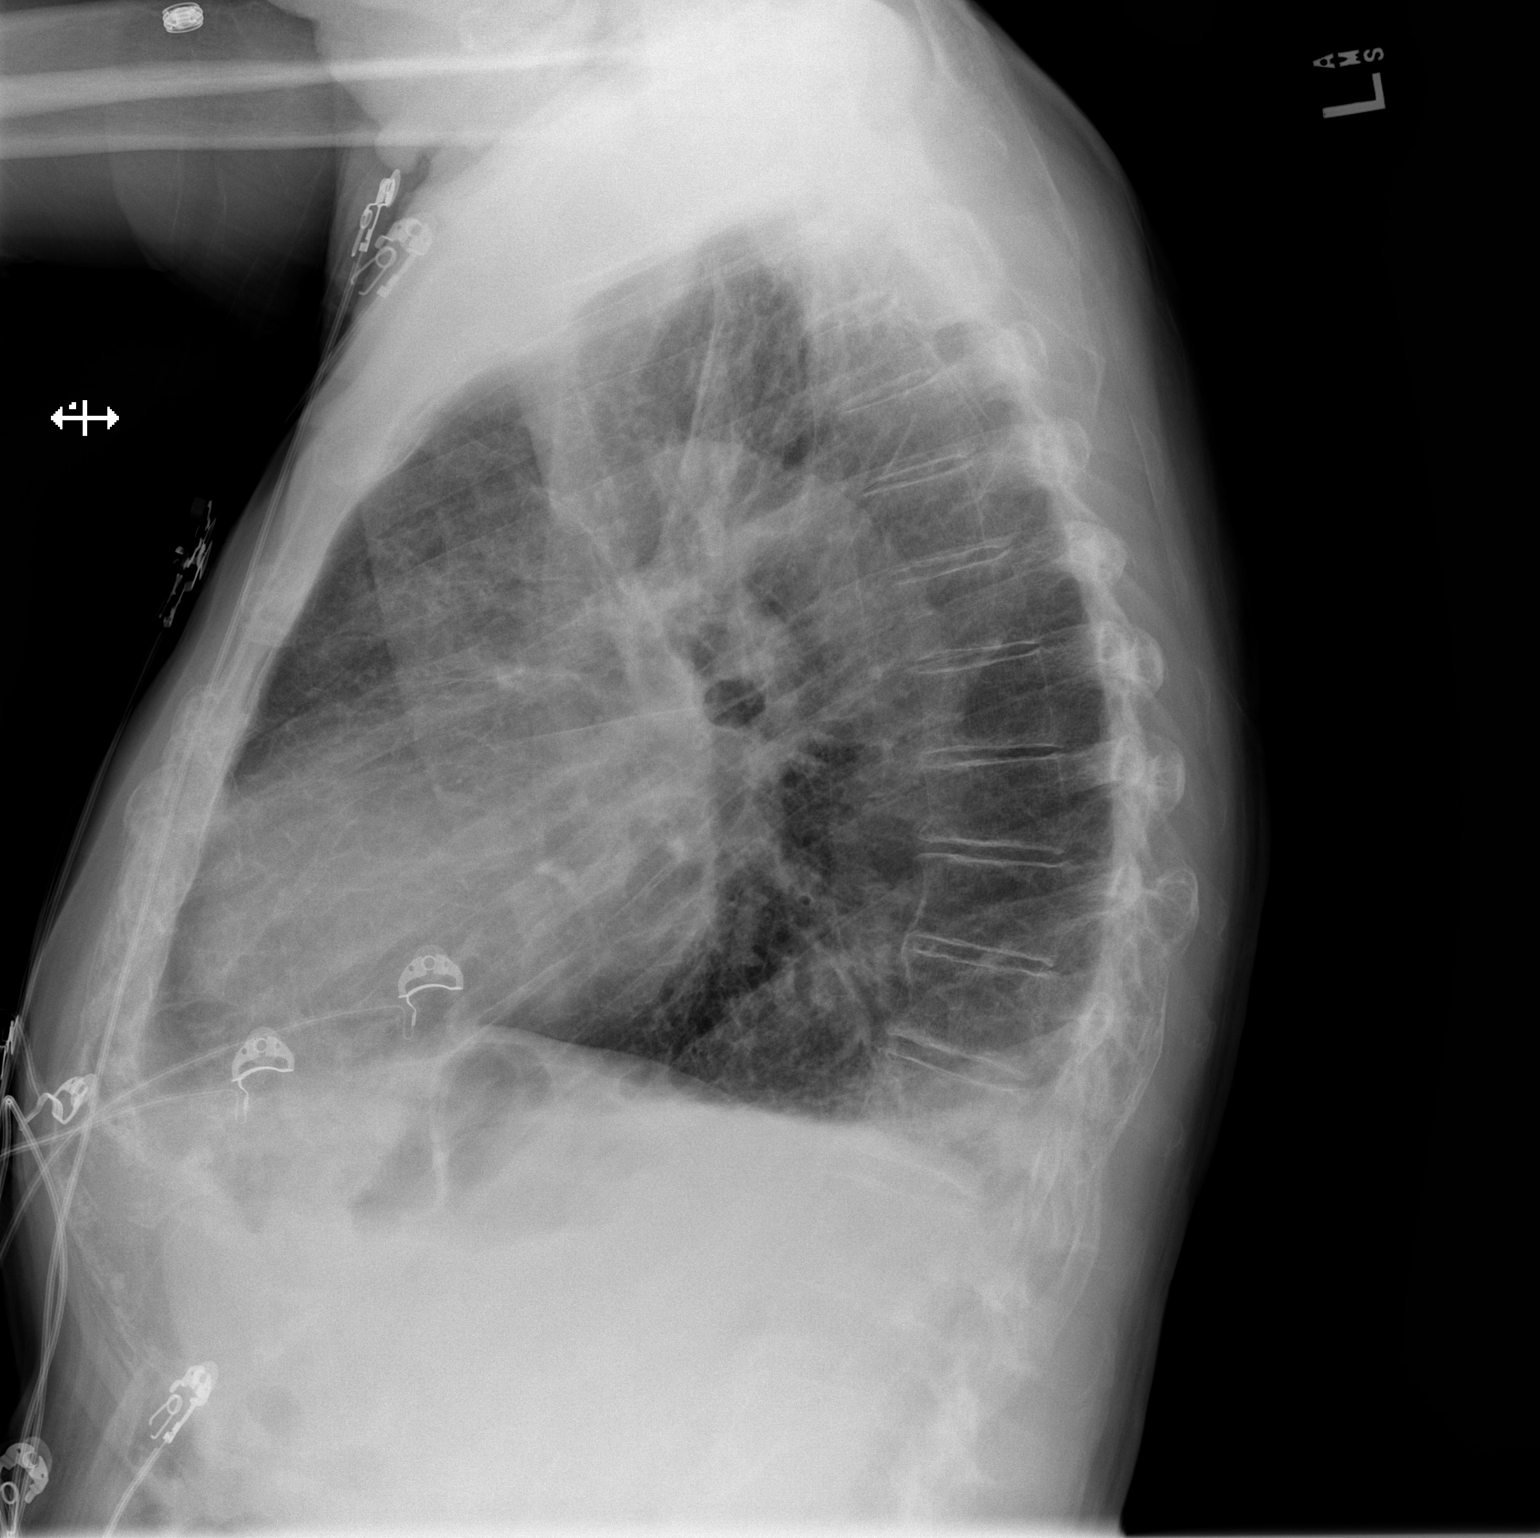

[2 of 2 positions shown; findings below may reference images not displayed]

FINDINGS: Small right pleural effusion. Right base atelectasis. Mild
hyperinflation of the lungs. Heart is normal size. No acute bony
abnormality.
IMPRESSION: Small right pleural effusion with right base atelectasis.

## 2022-02-12 ENCOUNTER — Ambulatory Visit: Payer: Medicare Other

## 2022-02-14 ENCOUNTER — Encounter: Payer: Self-pay | Admitting: Cardiovascular Disease

## 2022-02-14 ENCOUNTER — Other Ambulatory Visit: Payer: Self-pay

## 2022-02-14 ENCOUNTER — Ambulatory Visit
Admission: EM | Admit: 2022-02-14 | Discharge: 2022-02-14 | Disposition: A | Discharge status: Home / Self Care | Payer: Medicare Other | Attending: Physician Assistant | Admitting: Physician Assistant

## 2022-02-14 ENCOUNTER — Encounter: Payer: Self-pay | Admitting: Emergency Medicine

## 2022-02-14 DIAGNOSIS — J069 Acute upper respiratory infection, unspecified: Secondary | ICD-10-CM

## 2022-02-14 DIAGNOSIS — H1031 Unspecified acute conjunctivitis, right eye: Secondary | ICD-10-CM | POA: Diagnosis not present

## 2022-02-14 MED ORDER — POLYMYXIN B-TRIMETHOPRIM 10000-0.1 UNIT/ML-% OP SOLN: 1.0000 [drp] | OPHTHALMIC | 0 refills | Status: DC

## 2022-02-14 MED ORDER — DEXTROMETHORPHAN HBR 15 MG/5ML PO LIQD: 5.0000 mL | Freq: Three times a day (TID) | ORAL | 0 refills | Status: DC | PRN

## 2022-02-14 MED ORDER — OFLOXACIN 0.3 % OP SOLN: 1.0000 [drp] | Freq: Four times a day (QID) | OPHTHALMIC | 0 refills | Status: AC

## 2022-02-14 NOTE — ED Provider Notes (Signed)
[facility]    CSN: [id number] Arrival date & time: 02/14/22  1028      History   Chief Complaint Chief Complaint  Patient presents with   Conjunctivitis   Cough    HPI Todd Armstrong is a 88 y.o. male.   Patient here today for evaluation of cough and congestion that started last night.  He states that he did have some right eye drainage and irritation as well.  His eye was matted shut this morning.  He has not had any symptoms with his left eye.  He denies any fever.  He has tried taking Robitussin without significant relief of cough.  The history is provided by the patient.  Conjunctivitis Pertinent negatives include no abdominal pain and no shortness of breath.  Cough Associated symptoms: eye discharge   Associated symptoms: no chills, no ear pain, no fever, no shortness of breath and no sore throat    Past Medical History:  Diagnosis Date   Acute myocardial infarction    Anemia    Aortic insufficiency    Carotid stenosis    Chronic combined systolic and diastolic CHF (congestive heart failure) (HCC)    Coronary artery disease    PTCA and stenting of his right coronary artery. 3.5 x 16-mm Liberte stent.               Diabetes mellitus without complication ([place])    Dyslipidemia    Emphysema (subcutaneous) (surgical) resulting from a procedure    Hyperlipemia    Hypertension    Mitral regurgitation    Partial tear of rotator cuff    right shoulder   Peripheral vascular disease (HCC)    Persistent atrial fibrillation ([place])    Pulmonary hypertension (HCC)    Stroke [facility])    Wears dentures     Patient Active Problem List   Diagnosis Date Noted   CHF exacerbation ([place]) 09/27/2021   Emphysema/COPD ([place]) 09/27/2021   Type 2 diabetes mellitus with hyperlipidemia ([place]) 09/27/2021   Pulmonary HTN ([place]) 06/03/2021   Persistent atrial fibrillation (HCC)    Carotid stenosis, asymptomatic, right 03/23/2018   Bilateral carotid bruits 10/15/2016   Acute  myocardial infarction    Dyslipidemia    Coronary artery disease    Hypertension    Hyperlipemia     Past Surgical History:  Procedure Laterality Date   CARDIAC CATHETERIZATION  11/05/2008   CARDIAC CATHETERIZATION  10/22/1993   EF 60%   CARDIOVERSION N/A 06/05/2020   Procedure: CARDIOVERSION;  Surgeon: Thayer Headings, MD;  Location: [place];  Service: Cardiovascular;  Laterality: N/A;   CAROTID ENDARTERECTOMY Right 03/23/2018   CORONARY ANGIOPLASTY WITH STENT PLACEMENT     ENDARTERECTOMY Right 03/23/2018   Procedure: RIGHT CAROTID ARTERY ENDARTERECTOMY;  Surgeon: Waynetta Sandy, MD;  Location: [place];  Service: Vascular;  Laterality: Right;   FRACTURE SURGERY     righ fibula and tibia   HERNIA REPAIR  1984   KNEE ARTHROSCOPY Left    MULTIPLE TOOTH EXTRACTIONS     PATCH ANGIOPLASTY Right 03/23/2018   Procedure: WITH Rueben Bash 1CM X 6CM PATCH ANGIOPLASTY;  Surgeon: Waynetta Sandy, MD;  Location: [place];  Service: Vascular;  Laterality: Right;   SHOULDER ARTHROSCOPY Right 06/09/2017   Procedure: ARTHROSCOPY SHOULDER SUBACROMIAL DECOMPRESSION AND ACROMIOPLASTY;  Surgeon: Melrose Nakayama, MD;  Location: [place];  Service: Orthopedics;  Laterality: Right;  GENERAL ANESTHESIA WITH BLOCK       Home Medications    Prior  to Admission medications   Medication Sig Start Date End Date Taking? Authorizing Provider  Dextromethorphan HBr 15 MG/5ML LIQD Take 5 mLs (15 mg total) by mouth 3 (three) times daily as needed. 02/14/22  Yes Francene Finders, PA-C  ofloxacin (OCUFLOX) 0.3 % ophthalmic solution Place 1 drop into the right eye 4 (four) times daily for 7 days. 02/14/22 02/21/22 Yes Francene Finders, PA-C  Accu-Chek FastClix Lancets MISC For use when checking blood sugars 10/06/21   [provider]  albuterol (VENTOLIN HFA) 108 (90 Base) MCG/ACT inhaler Inhale 2 puffs into the lungs every 6 (six) hours as needed for wheezing or shortness of breath. 03/25/21    Brand Males, MD  apixaban (ELIQUIS) 5 MG TABS tablet TAKE 1 TABLET BY MOUTH  TWICE DAILY 10/06/21   Nahser, Wonda Cheng, MD  clopidogrel (PLAVIX) 75 MG tablet TAKE 1 TABLET BY MOUTH  DAILY 11/16/21   Nahser, Wonda Cheng, MD  metFORMIN (GLUCOPHAGE) 1000 MG tablet Take 1,000 mg by mouth 2 (two) times daily with a meal.    [provider]  metoprolol tartrate (LOPRESSOR) 25 MG tablet Take 0.5 tablets (12.5 mg total) by mouth 2 (two) times daily. 10/01/21 10/31/21  Donne Hazel, MD  nitroGLYCERIN (NITROSTAT) 0.4 MG SL tablet Place 1 tablet (0.4 mg total) under the tongue every 5 (five) minutes as needed for chest pain. 10/17/19   Nahser, Wonda Cheng, MD  potassium chloride (KLOR-CON) 10 MEQ tablet Take 1 tablet (10 mEq total) by mouth 2 (two) times daily. 10/01/21 11/04/21  Donne Hazel, MD  rosuvastatin (CRESTOR) 10 MG tablet TAKE 1 TABLET BY MOUTH  DAILY 11/04/21   Nahser, Wonda Cheng, MD  tamsulosin (FLOMAX) 0.4 MG CAPS capsule Take 0.4 mg by mouth at bedtime.  04/07/20   [provider]  Tiotropium Bromide Monohydrate (SPIRIVA RESPIMAT) 2.5 MCG/ACT AERS Inhale 2 puffs into the lungs daily. 04/21/21   Spero Geralds, MD  torsemide (DEMADEX) 20 MG tablet Take 1 tablet (20 mg total) by mouth daily. 10/01/21 10/31/21  Donne Hazel, MD  triamcinolone cream (KENALOG) 0.1 % Apply 1 application topically as needed (for itching). 07/22/20   [provider]    Family History Family History  Problem Relation Age of Onset   Heart attack Father     Social History Social History   Tobacco Use   Smoking status: Former    Packs/day: 1.00    Years: 8.00    Pack years: 8.00    Types: Cigarettes    Quit date: 09/12/1968    Years since quitting: 53.4   Smokeless tobacco: Never   Tobacco comments:    quit smoking cigarettes > 40 years ago  Vaping Use   Vaping Use: Never used  Substance Use Topics   Alcohol use: No   Drug use: No     Allergies   Patient has no known  allergies.   Review of Systems Review of Systems  Constitutional:  Negative for chills and fever.  HENT:  Positive for congestion. Negative for ear pain and sore throat.   Eyes:  Positive for discharge and redness.  Respiratory:  Positive for cough. Negative for shortness of breath.   Gastrointestinal:  Negative for abdominal pain, nausea and vomiting.    Physical Exam Triage Vital Signs ED Triage Vitals  Enc Vitals Group     BP 02/14/22 1044 117/66     Pulse Rate 02/14/22 1044 (!) 105     Resp 02/14/22 1044  16     Temp 02/14/22 1044 97.7 F (36.5 C)     Temp Source 02/14/22 1044 Oral     SpO2 02/14/22 1044 97 %     Weight --      Height --      Head Circumference --      Peak Flow --      Pain Score 02/14/22 1049 2     Pain Loc --      Pain Edu? --      Excl. in [place]? --    No data found.  Updated Vital Signs BP 117/66 (BP Location: Left Arm)    Pulse (!) 105    Temp 97.7 F (36.5 C) (Oral)    Resp 16    SpO2 97%       Physical Exam Vitals and nursing note reviewed.  Constitutional:      General: He is not in acute distress.    Appearance: Normal appearance. He is not ill-appearing.  HENT:     Head: Normocephalic and atraumatic.     Nose: Congestion present.  Eyes:     Comments: Right conjunctiva injected with matting noted to lashes, left conjunctiva normal  Cardiovascular:     Rate and Rhythm: Normal rate. Rhythm irregular.  Pulmonary:     Effort: Pulmonary effort is normal. No respiratory distress.     Breath sounds: Normal breath sounds. No wheezing, rhonchi or rales.  Skin:    General: Skin is warm and dry.  Neurological:     Mental Status: He is alert.  Psychiatric:        Mood and Affect: Mood normal.        Thought Content: Thought content normal.     UC Treatments / Results  Labs (all labs ordered are listed, but only abnormal results are displayed) Labs Reviewed  NOVEL CORONAVIRUS, NAA    EKG   Radiology No results  found.  Procedures Procedures (including critical care time)  Medications Ordered in UC Medications - No data to display  Initial Impression / Assessment and Plan / UC Course  I have reviewed the triage vital signs and the nursing notes.  Pertinent labs & imaging results that were available during my care of the patient were reviewed by me and considered in my medical decision making (see chart for details).    Antibiotic drop prescribed for conjunctivitis, suspect other symptoms are viral in etiology and will order COVID screening.  Cough syrup prescribed at patient's request.  Recommended follow-up if symptoms fail to improve or worsen.  Final Clinical Impressions(s) / UC Diagnoses   Final diagnoses:  Acute conjunctivitis of right eye, unspecified acute conjunctivitis type  Acute upper respiratory infection   Discharge Instructions   None    ED Prescriptions     Medication Sig Dispense Auth. Provider   trimethoprim-polymyxin b (POLYTRIM) ophthalmic solution  (Status: Discontinued) Place 1 drop into the right eye every 4 (four) hours for 7 days. 10 mL Francene Finders, PA-C   Dextromethorphan HBr 15 MG/5ML LIQD Take 5 mLs (15 mg total) by mouth 3 (three) times daily as needed. 118 mL Ewell Poe F, PA-C   ofloxacin (OCUFLOX) 0.3 % ophthalmic solution Place 1 drop into the right eye 4 (four) times daily for 7 days. 5 mL Francene Finders, PA-C      PDMP not reviewed this encounter.   Francene Finders, PA-C 02/14/22 1149

## 2022-02-14 NOTE — Progress Notes (Signed)
Cardiology Office Note   Date:  02/15/2022   ID:  Todd Armstrong, DOB 05/12/1931, MRN [id number]  PCP:  Lavone Orn, MD  Cardiologist:   [name], MD   Chief Complaint  Patient presents with   Coronary Artery Disease        1. CAD, s/p LAD stent 2009 2. HTN 3. Diabetes Mellitus 4. Hyperlipidemia 5. Carotid artery stenosis  - Right - mod- severe    Todd Armstrong is doing well from a cardiac standpoint. He has not had any episodes of chest pain or shortness breath. [name] been exercising on a regular basis.  He had a nice garden this year.    Apr 26, 2013:  Todd Armstrong is doing well.  No CP , no dyspnea.   He has put in his garden this year.    Nov. 12, 2014:  Apr 24, 2014:  Todd Armstrong is doing well.   No CP , no dyspnea.    hes getting his garden going.     Nov. 9, 2015:  Todd Armstrong is doing well.  His BP was elevated at his last visit.  We increased his Lisinopril/HCTZ and his Bp is much better.    May 15, 2015:   Todd Armstrong is a 88 y.o. male who presents for his CAD Doing well.   No CP or dyspnea.  Still caring for his wife who is ill.  Still very busy working out in the garden   Nov. 29, 2016:  Doing well.  No CP or dyspnea.  Still very busy taking care of his wife, Todd Armstrong  She is having lots of bleeding from her bowels.   May 13, 2016:  Todd Armstrong is doing well.  Still taking care of Todd Armstrong  - she has more bad days than good days .  Enjoys his garden  BP and HR are very good No CP or dyspnea  Nov. 3, 2017:  Doing well.   Worked in his garden this past summer .  BP is well controlled.  HR is slow,  But no syncopal episodes  Having more challenges with wife , Todd Armstrong now.   She seems to have more dementia .  July 18. 2018  No issues, no CP .  BP and HR are well controlled.   January 18, 2018:  Doing well.   Stays busy.    He has moderate to severe right carotid artery stenosis.  Recommendation is for a follow-up in 12 months which will be  November, 2019.  Sept. 4, 2019:  Tough time dealing with wife Todd Armstrong,   S/p recent right carotid endarectomy Todd Salon, MD )  No CP Has occasional dizziness   February 19, 2019:  Doing well .    No CP , no dyspnea.  Still worried about wife Todd Armstrong ,  She has a UTI  BP is a bit elevated.  Avoids salty foods  Wt was 154 Armstrong in Sept, 2019,   Todd Armstrong.   October 17, 2019: Todd Armstrong is seen today for a follow-up visit.  He has a history of coronary artery disease and is status post PTCA and stenting.  He also has carotid artery disease, hypertension, dyslipidemia and diabetes mellitus. Has rare episodes of orthostasis  These episodes are not so severe that he wants to consider decreasing the dose of his lisinopril HCTZ.[name]  He denies any chest pain or shortness of breath. Wife passed away since I last saw him   Apr 23, 2020:  Todd Armstrong is in atrial fib today .  He feels fine.  Cannot tell that his HR is irreg.  No cp ,  Slight DOE with exertion compared to last year he has been able to do all of his normal activities.  He has noticed a little bit more shortness of breath when walking out the yard.   June 9 , 2021:  Todd Armstrong is seen today for follow-up of his atrial fibrillation.  He was found to have atrial fibrillation about a month ago. Has stopped driving .  Slow reflexes.  He is still in AF We discussed cardioversion     Sept. 14, 2021:  Todd Armstrong is seen today for follow up of his afib and CAD We performed a cardioversion in June, 2021 No cp, no dyspnea Is cramping in his hands and left leg  Get DOE with any activity  No angina  Still eats some processed meats. ( hot dogs, sausage)  Echocardiogram from June, 2021 reveals normal left ventricular systolic function.  We were not able to determine his diastolic parameters.  He has moderate mitral regurgitation.  Mild aortic insufficiency.  Mild tricuspid insufficiency.  Gets up every 2-3 hours to urinate at  night  February 27, 2021: Todd Armstrong is feeling better after getting over a case of pneumonia.  He did not require hospitalization but required numerous other medications. Saw Dr. Laurann Montana,  Was treated with Abx.  Feeling better  Tested for Covid on Monday - was negative  maxzide was changed to lasix for acute CHF related to his pneumonia Is much better now   June 03, 2021: Todd Armstrong is seen for follow up of his CAD, HYTN , pulmonary htn Atrial fib No CP,  just very short of breath with any walking .  Follows with pulmonary  Echo shows EF 50-55%. Moderate pulmonary HTN  February 15, 2022 Todd Armstrong is seen for follow up of his CAD, HTN, pulmonary HTN, atrial fib Has moderate pulmonary HTN Has completed PT/ [facility]  No CP, no dyspnea  No passing out , no dizziness.   Past Medical History:  Diagnosis Date   Acute myocardial infarction    Anemia    Aortic insufficiency    Carotid stenosis    Chronic combined systolic and diastolic CHF (congestive heart failure) (HCC)    Coronary artery disease    PTCA and stenting of his right coronary artery. 3.5 x 16-mm Liberte stent.               Diabetes mellitus without complication ([place])    Dyslipidemia    Emphysema (subcutaneous) (surgical) resulting from a procedure    Hyperlipemia    Hypertension    Mitral regurgitation    Partial tear of rotator cuff    right shoulder   Peripheral vascular disease (HCC)    Persistent atrial fibrillation ([place])    Pulmonary hypertension ([place])    Stroke [facility])    Wears dentures     Past Surgical History:  Procedure Laterality Date   CARDIAC CATHETERIZATION  11/05/2008   CARDIAC CATHETERIZATION  10/22/1993   EF 60%   CARDIOVERSION N/A 06/05/2020   Procedure: CARDIOVERSION;  Surgeon: Thayer Headings, MD;  Location: [place];  Service: Cardiovascular;  Laterality: N/A;   CAROTID ENDARTERECTOMY Right 03/23/2018   CORONARY ANGIOPLASTY WITH STENT PLACEMENT     ENDARTERECTOMY Right 03/23/2018    Procedure: RIGHT CAROTID ARTERY ENDARTERECTOMY;  Surgeon: Waynetta Sandy, MD;  Location: [place];  Service: Vascular;  Laterality:  Right;   FRACTURE SURGERY     righ fibula and tibia   HERNIA REPAIR  1984   KNEE ARTHROSCOPY Left    MULTIPLE TOOTH EXTRACTIONS     PATCH ANGIOPLASTY Right 03/23/2018   Procedure: WITH XENOSURE 1CM X 6CM PATCH ANGIOPLASTY;  Surgeon: Waynetta Sandy, MD;  Location: [facility];  Service: Vascular;  Laterality: Right;   SHOULDER ARTHROSCOPY Right 06/09/2017   Procedure: ARTHROSCOPY SHOULDER SUBACROMIAL DECOMPRESSION AND ACROMIOPLASTY;  Surgeon: Melrose Nakayama, MD;  Location: [place];  Service: Orthopedics;  Laterality: Right;  GENERAL ANESTHESIA WITH BLOCK     Current Outpatient Medications  Medication Sig Dispense Refill   Accu-Chek FastClix Lancets MISC For use when checking blood sugars     albuterol (VENTOLIN HFA) 108 (90 Base) MCG/ACT inhaler Inhale 2 puffs into the lungs every 6 (six) hours as needed for wheezing or shortness of breath. 8 g 6   apixaban (ELIQUIS) 5 MG TABS tablet TAKE 1 TABLET BY MOUTH  TWICE DAILY 180 tablet 1   clopidogrel (PLAVIX) 75 MG tablet TAKE 1 TABLET BY MOUTH  DAILY 90 tablet 3   Dextromethorphan HBr 15 MG/5ML LIQD Take 5 mLs (15 mg total) by mouth 3 (three) times daily as needed. 118 mL 0   metFORMIN (GLUCOPHAGE) 1000 MG tablet Take 1,000 mg by mouth 2 (two) times daily with a meal.     metoprolol tartrate (LOPRESSOR) 25 MG tablet Take 0.5 tablets (12.5 mg total) by mouth 2 (two) times daily. 30 tablet 0   nitroGLYCERIN (NITROSTAT) 0.4 MG SL tablet Place 1 tablet (0.4 mg total) under the tongue every 5 (five) minutes as needed for chest pain. 25 tablet 6   ofloxacin (OCUFLOX) 0.3 % ophthalmic solution Place 1 drop into the right eye 4 (four) times daily for 7 days. 5 mL 0   potassium chloride (KLOR-CON) 10 MEQ tablet Take 1 tablet (10 mEq total) by mouth 2 (two) times daily. 60 tablet 0   rosuvastatin (CRESTOR) 10 MG  tablet TAKE 1 TABLET BY MOUTH  DAILY 90 tablet 2   tamsulosin (FLOMAX) 0.4 MG CAPS capsule Take 0.4 mg by mouth at bedtime.      Tiotropium Bromide Monohydrate (SPIRIVA RESPIMAT) 2.5 MCG/ACT AERS Inhale 2 puffs into the lungs daily. 4 g 5   torsemide (DEMADEX) 20 MG tablet Take 1 tablet (20 mg total) by mouth daily. 30 tablet 0   triamcinolone cream (KENALOG) 0.1 % Apply 1 application topically as needed (for itching).     No current facility-administered medications for this visit.    Allergies:   Patient has no known allergies.    Social History:  The patient  reports that he quit smoking about 53 years ago. His smoking use included cigarettes. He has a 8.00 pack-year smoking history. He has never used smokeless tobacco. He reports that he does not drink alcohol and does not use drugs.   Family History:  The patient's family history includes Heart attack in his father.    ROS: As per current history.  All other systems are negative.  Physical Exam: Blood pressure 138/60, pulse (!) 51, height 5' 5.5" (1.664 m), weight 138 lb 6.4 oz (62.8 kg), SpO2 98 %.  GEN:  elderly , somewhat frail man.    in no acute distress, in good health for 89 years .  HEENT: Normal NECK: No JVD; No carotid bruits LYMPHATICS: No lymphadenopathy CARDIAC: irreg. Irreg.  RESPIRATORY:  Clear to auscultation without rales, wheezing or rhonchi  ABDOMEN: Soft, non-tender, non-distended MUSCULOSKELETAL:  No edema; No deformity  SKIN: Warm and dry NEUROLOGIC:  Alert and oriented x 3     EKG:        Recent Labs: 09/27/2021: B Natriuretic Peptide 464.8 09/28/2021: TSH 1.377 09/29/2021: Magnesium 2.4 10/01/2021: ALT 16 11/04/2021: BUN 29; Creatinine, Ser 1.33; Hemoglobin 11.4; Platelets 166; Potassium 4.0; Sodium 140    Lipid Panel    Component Value Date/Time   CHOL 131 06/03/2021 1131   TRIG 72 06/03/2021 1131   HDL 79 06/03/2021 1131   CHOLHDL 1.7 06/03/2021 1131   CHOLHDL 2.3 10/15/2016 1215    VLDL 34 (H) 10/15/2016 1215   LDLCALC 38 06/03/2021 1131      Wt Readings from Last 3 Encounters:  02/15/22 138 lb 6.4 oz (62.8 kg)  01/27/22 142 lb 3.2 oz (64.5 kg)  11/04/21 137 lb 12.8 oz (62.5 kg)      Other studies Reviewed: Additional studies/ records that were reviewed today include: . Review of the above records demonstrates:    ASSESSMENT AND PLAN:  1.  Atrial fib:    stable,  cont current meds.    2.  Carotid artery stenosis: stable, no neuro symptoms     3.  Acute congestive heart failure related to his pneumonia:  No symptoms,  cont meds.     4.   HTN -    well controlled.     3. Diabetes Mellitus  4. Hyperlipidemia -  will check lipids , ALT, BMP to be drawn shortly before his 6 month visit with Sharyn Lull.    5. CAD, s/p LAD stent 2009 -    no angina      6.  Pulmonary hypertension:  stable    Follow up with Christen Bame, NP in 6 months    Current medicines are reviewed at length with the patient today.  The patient does not have concerns regarding medicines.  The following changes have been made:  no change  Labs/ tests ordered today include:   Orders Placed This Encounter  Procedures   ALT   Lipid panel   Basic metabolic panel    Disposition:       [name], MD  02/15/2022 11:53 AM    [facility] [address], [place], [place]  [address] Phone: [phone]; Fax: [phone]

## 2022-02-14 NOTE — ED Provider Notes (Incomplete)
[facility]    CSN: [id number] Arrival date & time: 02/14/22  1028      History   Chief Complaint Chief Complaint  Patient presents with   Conjunctivitis   Cough    HPI Todd [facility] is a 88 y.o. male.    Conjunctivitis Pertinent negatives include no abdominal pain and no shortness of breath.  Cough Associated symptoms: chills and sore throat   Associated symptoms: no ear pain, no eye discharge, no fever and no shortness of breath    Past Medical History:  Diagnosis Date   Acute myocardial infarction    Anemia    Aortic insufficiency    Carotid stenosis    Chronic combined systolic and diastolic CHF (congestive heart failure) (HCC)    Coronary artery disease    PTCA and stenting of his right coronary artery. 3.5 x 16-mm Liberte stent.               Diabetes mellitus without complication ([place])    Dyslipidemia    Emphysema (subcutaneous) (surgical) resulting from a procedure    Hyperlipemia    Hypertension    Mitral regurgitation    Partial tear of rotator cuff    right shoulder   Peripheral vascular disease (HCC)    Persistent atrial fibrillation ([place])    Pulmonary hypertension (HCC)    Stroke [facility])    Wears dentures     Patient Active Problem List   Diagnosis Date Noted   CHF exacerbation ([place]) 09/27/2021   Emphysema/COPD ([place]) 09/27/2021   Type 2 diabetes mellitus with hyperlipidemia ([place]) 09/27/2021   Pulmonary HTN ([place]) 06/03/2021   Persistent atrial fibrillation (HCC)    Carotid stenosis, asymptomatic, right 03/23/2018   Bilateral carotid bruits 10/15/2016   Acute myocardial infarction    Dyslipidemia    Coronary artery disease    Hypertension    Hyperlipemia     Past Surgical History:  Procedure Laterality Date   CARDIAC CATHETERIZATION  11/05/2008   CARDIAC CATHETERIZATION  10/22/1993   EF 60%   CARDIOVERSION N/A 06/05/2020   Procedure: CARDIOVERSION;  Surgeon: Thayer Headings, MD;   Location: [place];  Service: Cardiovascular;  Laterality: N/A;   CAROTID ENDARTERECTOMY Right 03/23/2018   CORONARY ANGIOPLASTY WITH STENT PLACEMENT     ENDARTERECTOMY Right 03/23/2018   Procedure: RIGHT CAROTID ARTERY ENDARTERECTOMY;  Surgeon: Waynetta Sandy, MD;  Location: [place];  Service: Vascular;  Laterality: Right;   FRACTURE SURGERY     righ fibula and tibia   HERNIA REPAIR  1984   KNEE ARTHROSCOPY Left    MULTIPLE TOOTH EXTRACTIONS     PATCH ANGIOPLASTY Right 03/23/2018   Procedure: WITH Rueben Bash 1CM X 6CM PATCH ANGIOPLASTY;  Surgeon: Waynetta Sandy, MD;  Location: [place];  Service: Vascular;  Laterality: Right;   SHOULDER ARTHROSCOPY Right 06/09/2017   Procedure: ARTHROSCOPY SHOULDER SUBACROMIAL DECOMPRESSION AND ACROMIOPLASTY;  Surgeon: Melrose Nakayama, MD;  Location: [place];  Service: Orthopedics;  Laterality: Right;  GENERAL ANESTHESIA WITH BLOCK       Home Medications    Prior to Admission medications   Medication Sig Start Date End Date Taking? Authorizing Provider  Accu-Chek FastClix Lancets MISC For use when checking blood sugars 10/06/21   [provider]  albuterol (VENTOLIN HFA) 108 (90 Base) MCG/ACT inhaler Inhale 2 puffs into the lungs every 6 (six) hours as needed for wheezing or shortness of breath. 03/25/21   Brand Males, MD  apixaban (ELIQUIS) 5 MG TABS tablet  TAKE 1 TABLET BY MOUTH  TWICE DAILY 10/06/21   Nahser, Wonda Cheng, MD  clopidogrel (PLAVIX) 75 MG tablet TAKE 1 TABLET BY MOUTH  DAILY 11/16/21   Nahser, Wonda Cheng, MD  metFORMIN (GLUCOPHAGE) 1000 MG tablet Take 1,000 mg by mouth 2 (two) times daily with a meal.    [provider]  metoprolol tartrate (LOPRESSOR) 25 MG tablet Take 0.5 tablets (12.5 mg total) by mouth 2 (two) times daily. 10/01/21 10/31/21  Donne Hazel, MD  nitroGLYCERIN (NITROSTAT) 0.4 MG SL tablet Place 1 tablet (0.4 mg total) under the tongue every 5 (five) minutes as needed for chest  pain. 10/17/19   Nahser, Wonda Cheng, MD  potassium chloride (KLOR-CON) 10 MEQ tablet Take 1 tablet (10 mEq total) by mouth 2 (two) times daily. 10/01/21 11/04/21  Donne Hazel, MD  rosuvastatin (CRESTOR) 10 MG tablet TAKE 1 TABLET BY MOUTH  DAILY 11/04/21   Nahser, Wonda Cheng, MD  tamsulosin (FLOMAX) 0.4 MG CAPS capsule Take 0.4 mg by mouth at bedtime.  04/07/20   [provider]  Tiotropium Bromide Monohydrate (SPIRIVA RESPIMAT) 2.5 MCG/ACT AERS Inhale 2 puffs into the lungs daily. 04/21/21   Spero Geralds, MD  torsemide (DEMADEX) 20 MG tablet Take 1 tablet (20 mg total) by mouth daily. 10/01/21 10/31/21  Donne Hazel, MD  triamcinolone cream (KENALOG) 0.1 % Apply 1 application topically as needed (for itching). 07/22/20   [provider]    Family History Family History  Problem Relation Age of Onset   Heart attack Father     Social History Social History   Tobacco Use   Smoking status: Former    Packs/day: 1.00    Years: 8.00    Pack years: 8.00    Types: Cigarettes    Quit date: 09/12/1968    Years since quitting: 53.4   Smokeless tobacco: Never   Tobacco comments:    quit smoking cigarettes > 40 years ago  Vaping Use   Vaping Use: Never used  Substance Use Topics   Alcohol use: No   Drug use: No     Allergies   Patient has no known allergies.   Review of Systems Review of Systems  Constitutional:  Positive for chills. Negative for fever.  HENT:  Positive for congestion and sore throat. Negative for ear pain.   Eyes:  Negative for discharge and redness.  Respiratory:  Positive for cough. Negative for shortness of breath.   Gastrointestinal:  Negative for abdominal pain, nausea and vomiting.    Physical Exam Triage Vital Signs ED Triage Vitals  Enc Vitals Group     BP 02/14/22 1044 117/66     Pulse Rate 02/14/22 1044 (!) 105     Resp 02/14/22 1044 16     Temp 02/14/22 1044 97.7 F (36.5 C)     Temp Source 02/14/22 1044 Oral      SpO2 02/14/22 1044 97 %     Weight --      Height --      Head Circumference --      Peak Flow --      Pain Score 02/14/22 1049 2     Pain Loc --      Pain Edu? --      Excl. in [place]? --    No data found.  Updated Vital Signs BP 117/66 (BP Location: Left Arm)    Pulse (!) 105    Temp 97.7 F (36.5 C) (Oral)  Resp 16    SpO2 97%      Physical Exam Vitals and nursing note reviewed.  Constitutional:      General: He is not in acute distress.    Appearance: He is well-developed. He is not ill-appearing.  HENT:     Head: Normocephalic and atraumatic.     Right Ear: Tympanic membrane normal.     Left Ear: Tympanic membrane normal.     Nose: Congestion present.     Mouth/Throat:     Mouth: Mucous membranes are moist.     Pharynx: Posterior oropharyngeal erythema present. No oropharyngeal exudate.     Tonsils: 0 on the right. 0 on the left.  Eyes:     Conjunctiva/sclera: Conjunctivae normal.  Cardiovascular:     Rate and Rhythm: Normal rate and regular rhythm.     Heart sounds: Normal heart sounds. No murmur heard. Pulmonary:     Effort: Pulmonary effort is normal. No respiratory distress.     Breath sounds: Normal breath sounds. No wheezing, rhonchi or rales.  Skin:    General: Skin is warm and dry.  Neurological:     Mental Status: He is alert.  Psychiatric:        Mood and Affect: Mood normal.        Behavior: Behavior normal.     UC Treatments / Results  Labs (all labs ordered are listed, but only abnormal results are displayed) Labs Reviewed - No data to display  EKG   Radiology No results found.  Procedures Procedures (including critical care time)  Medications Ordered in UC Medications - No data to display  Initial Impression / Assessment and Plan / UC Course  I have reviewed the triage vital signs and the nursing notes.  Pertinent labs & imaging results that were available during my care of the patient were reviewed by me and considered in my  medical decision making (see chart for details).     *** Final Clinical Impressions(s) / UC Diagnoses   Final diagnoses:  None   Discharge Instructions   None    ED Prescriptions   None    PDMP not reviewed this encounter.

## 2022-02-14 NOTE — ED Triage Notes (Signed)
Pt sts cough and congestion with right eye irritation and drainage started last night; pt sts eye was matted shut this am ?

## 2022-02-15 ENCOUNTER — Ambulatory Visit: Payer: Medicare Other | Admitting: Cardiovascular Disease

## 2022-02-15 ENCOUNTER — Encounter: Payer: Self-pay | Admitting: Cardiovascular Disease

## 2022-02-15 VITALS — BP 138/60 | HR 51 | Ht 65.5 in | Wt 138.4 lb

## 2022-02-15 DIAGNOSIS — I10 Essential (primary) hypertension: Secondary | ICD-10-CM | POA: Diagnosis not present

## 2022-02-15 DIAGNOSIS — E782 Mixed hyperlipidemia: Secondary | ICD-10-CM | POA: Diagnosis not present

## 2022-02-15 DIAGNOSIS — I2510 Atherosclerotic heart disease of native coronary artery without angina pectoris: Secondary | ICD-10-CM | POA: Diagnosis not present

## 2022-02-15 DIAGNOSIS — I5042 Chronic combined systolic (congestive) and diastolic (congestive) heart failure: Secondary | ICD-10-CM | POA: Diagnosis not present

## 2022-02-15 DIAGNOSIS — I4819 Other persistent atrial fibrillation: Secondary | ICD-10-CM | POA: Diagnosis not present

## 2022-02-15 DIAGNOSIS — I4891 Unspecified atrial fibrillation: Secondary | ICD-10-CM

## 2022-02-15 LAB — NOVEL CORONAVIRUS, NAA: SARS-CoV-2, NAA: NOT DETECTED

## 2022-02-15 NOTE — Patient Instructions (Addendum)
Medication Instructions:  ?Your physician recommends that you continue on your current medications as directed. Please refer to the Current Medication list given to you today.  ?*If you need a refill on your cardiac medications before your next appointment, please call your pharmacy* ? ? ?Lab Work: ?In 6 months (before follow-up visit): fasting Lipids, ALT, BMET ?If you have labs (blood work) drawn today and your tests are completely normal, you will receive your results only by: ?MyChart Message (if you have MyChart) OR ?A paper copy in the mail ?If you have any lab test that is abnormal or we need to change your treatment, we will call you to review the results. ? ? ?Testing/Procedures: ?NONE ? ? ?Follow-Up: ?At [facility], you and your health needs are our priority.  As part of our continuing mission to provide you with exceptional heart care, we have created designated Provider Care Teams.  These Care Teams include your primary Cardiologist (physician) and Advanced Practice Providers (APPs -  Physician Assistants and Nurse Practitioners) who all work together to provide you with the care you need, when you need it. ? ?We recommend signing up for the patient portal called "MyChart".  Sign up information is provided on this After Visit Summary.  MyChart is used to connect with patients for Virtual Visits (Telemedicine).  Patients are able to view lab/test results, encounter notes, upcoming appointments, etc.  Non-urgent messages can be sent to your provider as well.   ?To learn more about what you can do with MyChart, go to [email or web address].   ? ?Your next appointment:   ?6 month(s) ? ?The format for your next appointment:   ?In Person ? ?Provider:   ?Christen Bame, NP ?

## 2022-03-07 ENCOUNTER — Other Ambulatory Visit: Payer: Self-pay | Admitting: Cardiovascular Disease

## 2022-03-07 DIAGNOSIS — I4819 Other persistent atrial fibrillation: Secondary | ICD-10-CM

## 2022-03-08 NOTE — Telephone Encounter (Signed)
Eliquis 5 mg refill request received. Patient is 88 years old, weight- 62.8 kg, Crea- 1.33 on 11/04/21, Diagnosis- afib, and last seen by Dr. Acie Fredrickson on 02/15/22. Dose is appropriate based on dosing criteria. Will send in refill to requested pharmacy.   ?

## 2022-03-15 DIAGNOSIS — L84 Corns and callosities: Secondary | ICD-10-CM | POA: Diagnosis not present

## 2022-03-15 DIAGNOSIS — I739 Peripheral vascular disease, unspecified: Secondary | ICD-10-CM | POA: Diagnosis not present

## 2022-03-15 DIAGNOSIS — L603 Nail dystrophy: Secondary | ICD-10-CM | POA: Diagnosis not present

## 2022-03-15 DIAGNOSIS — E1151 Type 2 diabetes mellitus with diabetic peripheral angiopathy without gangrene: Secondary | ICD-10-CM | POA: Diagnosis not present

## 2022-03-15 DIAGNOSIS — R29898 Other symptoms and signs involving the musculoskeletal system: Secondary | ICD-10-CM | POA: Diagnosis not present

## 2022-03-15 DIAGNOSIS — B351 Tinea unguium: Secondary | ICD-10-CM | POA: Diagnosis not present

## 2022-04-12 ENCOUNTER — Other Ambulatory Visit: Payer: Self-pay | Admitting: Internal Medicine

## 2022-04-12 ENCOUNTER — Ambulatory Visit
Admission: RE | Admit: 2022-04-12 | Discharge: 2022-04-12 | Disposition: A | Discharge status: Home / Self Care | Payer: Medicare Other | Source: Ambulatory Visit | Attending: Internal Medicine | Admitting: Internal Medicine

## 2022-04-12 DIAGNOSIS — L03116 Cellulitis of left lower limb: Secondary | ICD-10-CM | POA: Diagnosis not present

## 2022-04-12 DIAGNOSIS — R059 Cough, unspecified: Secondary | ICD-10-CM | POA: Diagnosis not present

## 2022-04-12 DIAGNOSIS — R053 Chronic cough: Secondary | ICD-10-CM | POA: Diagnosis not present

## 2022-04-16 ENCOUNTER — Other Ambulatory Visit: Payer: Self-pay | Admitting: Cardiovascular Disease

## 2022-04-20 DIAGNOSIS — L03116 Cellulitis of left lower limb: Secondary | ICD-10-CM | POA: Diagnosis not present

## 2022-04-20 DIAGNOSIS — L299 Pruritus, unspecified: Secondary | ICD-10-CM | POA: Diagnosis not present

## 2022-05-13 DIAGNOSIS — D692 Other nonthrombocytopenic purpura: Secondary | ICD-10-CM | POA: Diagnosis not present

## 2022-05-13 DIAGNOSIS — C44519 Basal cell carcinoma of skin of other part of trunk: Secondary | ICD-10-CM | POA: Diagnosis not present

## 2022-05-13 DIAGNOSIS — Z85828 Personal history of other malignant neoplasm of skin: Secondary | ICD-10-CM | POA: Diagnosis not present

## 2022-05-13 DIAGNOSIS — C44529 Squamous cell carcinoma of skin of other part of trunk: Secondary | ICD-10-CM | POA: Diagnosis not present

## 2022-05-13 DIAGNOSIS — L821 Other seborrheic keratosis: Secondary | ICD-10-CM | POA: Diagnosis not present

## 2022-05-13 DIAGNOSIS — L570 Actinic keratosis: Secondary | ICD-10-CM | POA: Diagnosis not present

## 2022-05-18 DIAGNOSIS — G8929 Other chronic pain: Secondary | ICD-10-CM | POA: Diagnosis not present

## 2022-05-18 DIAGNOSIS — I5042 Chronic combined systolic (congestive) and diastolic (congestive) heart failure: Secondary | ICD-10-CM | POA: Diagnosis not present

## 2022-05-18 DIAGNOSIS — I2510 Atherosclerotic heart disease of native coronary artery without angina pectoris: Secondary | ICD-10-CM | POA: Diagnosis not present

## 2022-05-18 DIAGNOSIS — E7800 Pure hypercholesterolemia, unspecified: Secondary | ICD-10-CM | POA: Diagnosis not present

## 2022-05-18 DIAGNOSIS — E0869 Diabetes mellitus due to underlying condition with other specified complication: Secondary | ICD-10-CM | POA: Diagnosis not present

## 2022-05-18 DIAGNOSIS — I482 Chronic atrial fibrillation, unspecified: Secondary | ICD-10-CM | POA: Diagnosis not present

## 2022-05-18 DIAGNOSIS — J439 Emphysema, unspecified: Secondary | ICD-10-CM | POA: Diagnosis not present

## 2022-05-18 DIAGNOSIS — I10 Essential (primary) hypertension: Secondary | ICD-10-CM | POA: Diagnosis not present

## 2022-05-26 DIAGNOSIS — B351 Tinea unguium: Secondary | ICD-10-CM | POA: Diagnosis not present

## 2022-05-26 DIAGNOSIS — E1151 Type 2 diabetes mellitus with diabetic peripheral angiopathy without gangrene: Secondary | ICD-10-CM | POA: Diagnosis not present

## 2022-05-26 DIAGNOSIS — L84 Corns and callosities: Secondary | ICD-10-CM | POA: Diagnosis not present

## 2022-05-26 DIAGNOSIS — R29898 Other symptoms and signs involving the musculoskeletal system: Secondary | ICD-10-CM | POA: Diagnosis not present

## 2022-05-28 DIAGNOSIS — J387 Other diseases of larynx: Secondary | ICD-10-CM | POA: Diagnosis not present

## 2022-05-28 DIAGNOSIS — R490 Dysphonia: Secondary | ICD-10-CM | POA: Diagnosis not present

## 2022-05-31 ENCOUNTER — Other Ambulatory Visit: Payer: Self-pay | Admitting: Cardiovascular Disease

## 2022-05-31 DIAGNOSIS — I4819 Other persistent atrial fibrillation: Secondary | ICD-10-CM

## 2022-06-01 NOTE — Telephone Encounter (Signed)
Prescription refill request for Eliquis received. Indication:Afib Last office visit:3/23 Scr:1.3 Age: 88 Weight:62.8 kg  Prescription refilled

## 2022-06-23 DIAGNOSIS — I5032 Chronic diastolic (congestive) heart failure: Secondary | ICD-10-CM | POA: Diagnosis not present

## 2022-06-23 DIAGNOSIS — E1169 Type 2 diabetes mellitus with other specified complication: Secondary | ICD-10-CM | POA: Diagnosis not present

## 2022-06-23 DIAGNOSIS — Z8673 Personal history of transient ischemic attack (TIA), and cerebral infarction without residual deficits: Secondary | ICD-10-CM | POA: Diagnosis not present

## 2022-06-23 DIAGNOSIS — I482 Chronic atrial fibrillation, unspecified: Secondary | ICD-10-CM | POA: Diagnosis not present

## 2022-06-23 DIAGNOSIS — J439 Emphysema, unspecified: Secondary | ICD-10-CM | POA: Diagnosis not present

## 2022-06-23 DIAGNOSIS — I10 Essential (primary) hypertension: Secondary | ICD-10-CM | POA: Diagnosis not present

## 2022-06-23 DIAGNOSIS — I2510 Atherosclerotic heart disease of native coronary artery without angina pectoris: Secondary | ICD-10-CM | POA: Diagnosis not present

## 2022-06-23 DIAGNOSIS — E0869 Diabetes mellitus due to underlying condition with other specified complication: Secondary | ICD-10-CM | POA: Diagnosis not present

## 2022-06-23 DIAGNOSIS — Z23 Encounter for immunization: Secondary | ICD-10-CM | POA: Diagnosis not present

## 2022-06-23 DIAGNOSIS — I700 Atherosclerosis of aorta: Secondary | ICD-10-CM | POA: Diagnosis not present

## 2022-07-02 DIAGNOSIS — E7800 Pure hypercholesterolemia, unspecified: Secondary | ICD-10-CM | POA: Diagnosis not present

## 2022-07-02 DIAGNOSIS — I10 Essential (primary) hypertension: Secondary | ICD-10-CM | POA: Diagnosis not present

## 2022-07-02 DIAGNOSIS — E0869 Diabetes mellitus due to underlying condition with other specified complication: Secondary | ICD-10-CM | POA: Diagnosis not present

## 2022-07-02 DIAGNOSIS — I482 Chronic atrial fibrillation, unspecified: Secondary | ICD-10-CM | POA: Diagnosis not present

## 2022-07-02 DIAGNOSIS — I5042 Chronic combined systolic (congestive) and diastolic (congestive) heart failure: Secondary | ICD-10-CM | POA: Diagnosis not present

## 2022-07-15 ENCOUNTER — Other Ambulatory Visit: Payer: Self-pay | Admitting: Cardiovascular Disease

## 2022-07-27 DIAGNOSIS — R29898 Other symptoms and signs involving the musculoskeletal system: Secondary | ICD-10-CM | POA: Diagnosis not present

## 2022-07-27 DIAGNOSIS — E1151 Type 2 diabetes mellitus with diabetic peripheral angiopathy without gangrene: Secondary | ICD-10-CM | POA: Diagnosis not present

## 2022-07-27 DIAGNOSIS — B351 Tinea unguium: Secondary | ICD-10-CM | POA: Diagnosis not present

## 2022-08-11 DIAGNOSIS — I10 Essential (primary) hypertension: Secondary | ICD-10-CM | POA: Diagnosis not present

## 2022-08-11 DIAGNOSIS — I5042 Chronic combined systolic (congestive) and diastolic (congestive) heart failure: Secondary | ICD-10-CM | POA: Diagnosis not present

## 2022-08-11 DIAGNOSIS — E0869 Diabetes mellitus due to underlying condition with other specified complication: Secondary | ICD-10-CM | POA: Diagnosis not present

## 2022-08-11 DIAGNOSIS — G8929 Other chronic pain: Secondary | ICD-10-CM | POA: Diagnosis not present

## 2022-08-11 DIAGNOSIS — E7800 Pure hypercholesterolemia, unspecified: Secondary | ICD-10-CM | POA: Diagnosis not present

## 2022-08-17 ENCOUNTER — Other Ambulatory Visit: Payer: Medicare Other

## 2022-09-04 NOTE — Progress Notes (Deleted)
Cardiology Office Note:    Date:  09/04/2022   ID:  Todd Armstrong, DOB [birth year], MRN [id number]  PCP:  Lavone Orn, MD   [facility] Providers Cardiologist:  [name], MD { Click to update primary MD,subspecialty MD or APP then REFRESH:1}    Referring MD: Lavone Orn, MD   Chief Complaint: ***  History of Present Illness:    Todd Armstrong is a *** 88 y.o. male with a hx of CAD with remote LAD stent 2009, HTN, DM, HLD, carotid stenosis s/p R CEA 2019 (followed by [facility] with most recent duplex 40-59% stenosis R ICA, L ICA with 1-39%), cardiomyopathy/chronic combined CHF, persistent atrial fibrillation, moderate pulmonary hypertension, CKD stage IIIa, chronic anemia, emphysema, mild AI/MR, stroke  Underwent DCCV but reverted back to atrial fibs and has been managed with rate control strategy.  Recently admitted 09/2021 with CHF with notation of increased dietary sodium intake.  2D echo 09/2021 EF 45 to 50%, global HK, moderately elevated PASP, mild LAE, mild MR, mild AI/mild to moderate aortic sclerosis without stenosis.  Previous EF 04/2021 was 50 to 55%.  He was managed with diuresis, also had some bradycardia during admission with recommendation to watch for tachy-brady syndrome.  Diltiazem and carvedilol were changed to metoprolol. Lasix was switched to torsemide. He completed rehab after hospitalization.   He was last seen in cardiology clinic on 02/15/2022 by Dr. Acie Fredrickson. He was doing well at that time and was advised to return for follow up in 6 months with fasting labs prior.   Today, he is here   Past Medical History:  Diagnosis Date   Acute myocardial infarction    Anemia    Aortic insufficiency    Carotid stenosis    Chronic combined systolic and diastolic CHF (congestive heart failure) (HCC)    Coronary artery disease    PTCA and stenting of his right coronary artery. 3.5 x 16-mm Liberte stent.               Diabetes mellitus without complication ([place])     Dyslipidemia    Emphysema (subcutaneous) (surgical) resulting from a procedure    Hyperlipemia    Hypertension    Mitral regurgitation    Partial tear of rotator cuff    right shoulder   Peripheral vascular disease (HCC)    Persistent atrial fibrillation ([facility])    Pulmonary hypertension ([place])    Stroke [facility])    Wears dentures     Past Surgical History:  Procedure Laterality Date   CARDIAC CATHETERIZATION  11/05/2008   CARDIAC CATHETERIZATION  10/22/1993   EF 60%   CARDIOVERSION N/A 06/05/2020   Procedure: CARDIOVERSION;  Surgeon: Thayer Headings, MD;  Location: [facility];  Service: Cardiovascular;  Laterality: N/A;   CAROTID ENDARTERECTOMY Right 03/23/2018   CORONARY ANGIOPLASTY WITH STENT PLACEMENT     ENDARTERECTOMY Right 03/23/2018   Procedure: RIGHT CAROTID ARTERY ENDARTERECTOMY;  Surgeon: Waynetta Sandy, MD;  Location: [facility];  Service: Vascular;  Laterality: Right;   FRACTURE SURGERY     righ fibula and tibia   HERNIA REPAIR  1984   KNEE ARTHROSCOPY Left    MULTIPLE TOOTH EXTRACTIONS     PATCH ANGIOPLASTY Right 03/23/2018   Procedure: WITH Rueben Bash 1CM X 6CM PATCH ANGIOPLASTY;  Surgeon: Waynetta Sandy, MD;  Location: [facility];  Service: Vascular;  Laterality: Right;   SHOULDER ARTHROSCOPY Right 06/09/2017   Procedure: ARTHROSCOPY SHOULDER SUBACROMIAL DECOMPRESSION AND ACROMIOPLASTY;  Surgeon: Melrose Nakayama,  MD;  Location: [facility];  Service: Orthopedics;  Laterality: Right;  GENERAL ANESTHESIA WITH BLOCK    Current Medications: No outpatient medications have been marked as taking for Todd 09/08/22 encounter (Appointment) with Ann Maki, Lanice Schwab, NP.     Allergies:   Patient has no known allergies.   Social History   Socioeconomic History   Marital status: Widowed    Spouse name: Not on file   Number of children: Not on file   Years of education: Not on file   Highest education level: Not on file  Occupational History   Not on file  Tobacco Use    Smoking status: Former    Packs/day: 1.00    Years: 8.00    Total pack years: 8.00    Types: Cigarettes    Quit date: 09/12/1968    Years since quitting: 54.0   Smokeless tobacco: Never   Tobacco comments:    quit smoking cigarettes > 40 years ago  Vaping Use   Vaping Use: Never used  Substance and Sexual Activity   Alcohol use: No   Drug use: No   Sexual activity: Not on file  Other Topics Concern   Not on file  Social History Narrative   Not on file   Social Determinants of Health   Financial Resource Strain: Not on file  Food Insecurity: Not on file  Transportation Needs: Not on file  Physical Activity: Not on file  Stress: Not on file  Social Connections: Not on file     Family History: Todd patient's ***family history includes Heart attack in his father.  ROS:   Please see Todd history of present illness.    *** All other systems reviewed and are negative.  Labs/Other Studies Reviewed:    Todd following studies were reviewed today:  Echo 09/28/21  1. Left ventricular ejection fraction, by estimation, is 45 to 50%. Todd  left ventricle has mildly decreased function. Todd left ventricle  demonstrates global hypokinesis. Left ventricular diastolic parameters are  indeterminate.   2. Right ventricular systolic function is normal. Todd right ventricular  size is normal. There is moderately elevated pulmonary artery systolic  pressure. Todd estimated right ventricular systolic pressure is 88.8 mmHg.   3. Left atrial size was moderately dilated.   4. Todd mitral valve is normal in structure. Mild mitral valve  regurgitation. No evidence of mitral stenosis.   5. Todd aortic valve is calcified. There is moderate calcification of Todd  aortic valve. Aortic valve regurgitation is mild. Mild to moderate aortic  valve sclerosis/calcification is present, without any evidence of aortic  stenosis.   6. Todd inferior vena cava is normal in size with greater than 50%  respiratory  variability, suggesting right atrial pressure of 3 mmHg.    Recent Labs: 09/27/2021: B Natriuretic Peptide 464.8 09/28/2021: TSH 1.377 09/29/2021: Magnesium 2.4 10/01/2021: ALT 16 11/04/2021: BUN 29; Creatinine, Ser 1.33; Hemoglobin 11.4; Platelets 166; Potassium 4.0; Sodium 140  Recent Lipid Panel    Component Value Date/Time   CHOL 131 06/03/2021 1131   TRIG 72 06/03/2021 1131   HDL 79 06/03/2021 1131   CHOLHDL 1.7 06/03/2021 1131   CHOLHDL 2.3 10/15/2016 1215   VLDL 34 (H) 10/15/2016 1215   LDLCALC 38 06/03/2021 1131     Risk Assessment/Calculations:   {Does this patient have ATRIAL FIBRILLATION?[phone]}       Physical Exam:    VS:  There were no vitals taken for this visit.    Wt Readings  from Last 3 Encounters:  02/15/22 138 lb 6.4 oz (62.8 kg)  01/27/22 142 lb 3.2 oz (64.5 kg)  11/04/21 137 lb 12.8 oz (62.5 kg)     GEN: *** Well nourished, well developed in no acute distress HEENT: Normal NECK: No JVD; No carotid bruits CARDIAC: ***RRR, no murmurs, rubs, gallops RESPIRATORY:  Clear to auscultation without rales, wheezing or rhonchi  ABDOMEN: Soft, non-tender, non-distended MUSCULOSKELETAL:  No edema; No deformity. *** pedal pulses, ***bilaterally SKIN: Warm and dry NEUROLOGIC:  Alert and oriented x 3 PSYCHIATRIC:  Normal affect   EKG:  EKG is *** ordered today.  Todd ekg ordered today demonstrates ***  No BP recorded.  {Refresh Note OR Click here to enter BP  :1}***    Diagnoses:    No diagnosis found. Assessment and Plan:     CAD without angina: Remote stent to RCA  Chronic combined CHF/Cardiomyopathy:  Hypertension:  Persistent atrial fibrillation: Weight?  Carotid artery disease: Stable on carotid duplex 09/2021 with 40-59% on right and 1-39% on left. Followed by [facility]. Due for follow-up.   Valve disease: Mild MR, moderate calcification of aortic valve with mild AI, mild to moderate aortic sclerosis without evidence of aortic stenosis by  echo 09/28/2021  {Are you ordering a CV Procedure (e.g. stress test, cath, DCCV, TEE, etc)?   Press F2        :[id number]}   Disposition:  Medication Adjustments/Labs and Tests Ordered: Current medicines are reviewed at length with Todd patient today.  Concerns regarding medicines are outlined above.  No orders of Todd defined types were placed in this encounter.  No orders of Todd defined types were placed in this encounter.   There are no Patient Instructions on file for this visit.   Signed, Emmaline Life, NP  09/04/2022 3:47 PM    [place]

## 2022-09-07 DIAGNOSIS — C44519 Basal cell carcinoma of skin of other part of trunk: Secondary | ICD-10-CM | POA: Diagnosis not present

## 2022-09-07 DIAGNOSIS — L570 Actinic keratosis: Secondary | ICD-10-CM | POA: Diagnosis not present

## 2022-09-07 DIAGNOSIS — Z85828 Personal history of other malignant neoplasm of skin: Secondary | ICD-10-CM | POA: Diagnosis not present

## 2022-09-07 DIAGNOSIS — D1801 Hemangioma of skin and subcutaneous tissue: Secondary | ICD-10-CM | POA: Diagnosis not present

## 2022-09-08 ENCOUNTER — Ambulatory Visit: Payer: Medicare Other | Admitting: Nurse Practitioner

## 2022-09-08 NOTE — Progress Notes (Unsigned)
Cardiology Office Note:    Date:  09/09/2022   ID:  Todd Armstrong, DOB [birth year], MRN [id number]  PCP:  Kirby Funk, MD   [facility] Providers Cardiologist:  Kristeen Miss, MD     Referring MD: Kirby Funk, MD   Chief Complaint: follow-up CAD, a fib  History of Present Illness:    Todd Armstrong is a very pleasant 88 y.o. male with a hx of CAD with remote LAD stent 2009, HTN, DM, HLD, carotid stenosis s/p R CEA 2019 (followed by [facility] with most recent duplex 40-59% stenosis R ICA, L ICA with 1-39%), cardiomyopathy/chronic combined CHF, persistent atrial fibrillation, moderate pulmonary hypertension, CKD stage IIIa, chronic anemia, emphysema, mild AI/MR, stroke  Underwent DCCV but reverted back to atrial fib and has been managed with rate control strategy.  Recently admitted 09/2021 with CHF with notation of increased dietary sodium intake.  2D echo 09/2021 EF 45 to 50%, global HK, moderately elevated PASP, mild LAE, mild MR, mild AI/mild to moderate aortic sclerosis without stenosis.  Previous EF 04/2021 was 50 to 55%.  He was managed with diuresis, also had some bradycardia during admission with recommendation to watch for tachy-brady syndrome.  Diltiazem and carvedilol were changed to metoprolol. Lasix was switched to torsemide. He completed rehab after hospitalization.   He was last seen in cardiology clinic on 02/15/2022 by Dr. Elease Hashimoto. He was doing well at that time and was advised to return for follow up in 6 months with fasting labs prior.   Today, he is here with his daughter.  He reports he is feeling well and continues to live alone.  His daughter keeps a close watch on him. Occasionally feels HR speed up, has not felt this for a prolonged period of time.  He denies chest pain, dyspnea, palpitations, orthopnea, PND and lower extremity swelling.  Continues to work around the house and in the yard with no concerns for activity intolerance. His skin bleeds easily when he  nicks himself, but this is not too problematic for him.  No melena, hemoptysis, or hematuria. His daughter expressed appreciation for me explaining the difference between Plavix and Eliquis.  Past Medical History:  Diagnosis Date   Acute myocardial infarction    Anemia    Aortic insufficiency    Carotid stenosis    Chronic combined systolic and diastolic CHF (congestive heart failure) (HCC)    Coronary artery disease    PTCA and stenting of his right coronary artery. 3.5 x 16-mm Liberte stent.               Diabetes mellitus without complication (HCC)    Dyslipidemia    Emphysema (subcutaneous) (surgical) resulting from a procedure    Hyperlipemia    Hypertension    Mitral regurgitation    Partial tear of rotator cuff    right shoulder   Peripheral vascular disease (HCC)    Persistent atrial fibrillation (HCC)    Pulmonary hypertension (HCC)    Stroke [facility])    Wears dentures     Past Surgical History:  Procedure Laterality Date   CARDIAC CATHETERIZATION  11/05/2008   CARDIAC CATHETERIZATION  10/22/1993   EF 60%   CARDIOVERSION N/A 06/05/2020   Procedure: CARDIOVERSION;  Surgeon: Vesta Mixer, MD;  Location: [facility] ENDOSCOPY;  Service: Cardiovascular;  Laterality: N/A;   CAROTID ENDARTERECTOMY Right 03/23/2018   CORONARY ANGIOPLASTY WITH STENT PLACEMENT     ENDARTERECTOMY Right 03/23/2018   Procedure: RIGHT CAROTID ARTERY ENDARTERECTOMY;  Surgeon: Waynetta Sandy, MD;  Location: [place];  Service: Vascular;  Laterality: Right;   FRACTURE SURGERY     righ fibula and tibia   HERNIA REPAIR  1984   KNEE ARTHROSCOPY Left    MULTIPLE TOOTH EXTRACTIONS     PATCH ANGIOPLASTY Right 03/23/2018   Procedure: WITH XENOSURE 1CM X 6CM PATCH ANGIOPLASTY;  Surgeon: Waynetta Sandy, MD;  Location: [place];  Service: Vascular;  Laterality: Right;   SHOULDER ARTHROSCOPY Right 06/09/2017   Procedure: ARTHROSCOPY SHOULDER SUBACROMIAL DECOMPRESSION AND ACROMIOPLASTY;  Surgeon:  Melrose Nakayama, MD;  Location: [place];  Service: Orthopedics;  Laterality: Right;  GENERAL ANESTHESIA WITH BLOCK    Current Medications: Current Meds  Medication Sig   Accu-Chek FastClix Lancets MISC For use when checking blood sugars   albuterol (VENTOLIN HFA) 108 (90 Base) MCG/ACT inhaler Inhale 2 puffs into the lungs every 6 (six) hours as needed for wheezing or shortness of breath.   clopidogrel (PLAVIX) 75 MG tablet TAKE 1 TABLET BY MOUTH DAILY   Dextromethorphan HBr 15 MG/5ML LIQD Take 5 mLs (15 mg total) by mouth 3 (three) times daily as needed.   ELIQUIS 5 MG TABS tablet TAKE 1 TABLET BY MOUTH TWICE  DAILY   metFORMIN (GLUCOPHAGE) 1000 MG tablet Take 1,000 mg by mouth 2 (two) times daily with a meal.   metoprolol tartrate (LOPRESSOR) 25 MG tablet Take 0.5 tablets (12.5 mg total) by mouth 2 (two) times daily.   nitroGLYCERIN (NITROSTAT) 0.4 MG SL tablet Place 1 tablet (0.4 mg total) under the tongue every 5 (five) minutes as needed for chest pain.   potassium chloride (KLOR-CON) 10 MEQ tablet Take 1 tablet (10 mEq total) by mouth 2 (two) times daily.   rosuvastatin (CRESTOR) 10 MG tablet TAKE 1 TABLET BY MOUTH DAILY   tamsulosin (FLOMAX) 0.4 MG CAPS capsule Take 0.4 mg by mouth at bedtime.    Tiotropium Bromide Monohydrate (SPIRIVA RESPIMAT) 2.5 MCG/ACT AERS Inhale 2 puffs into the lungs daily.   torsemide (DEMADEX) 20 MG tablet Take 1 tablet (20 mg total) by mouth daily.   triamcinolone cream (KENALOG) 0.1 % Apply 1 application topically as needed (for itching).     Allergies:   Patient has no known allergies.   Social History   Socioeconomic History   Marital status: Widowed    Spouse name: Not on file   Number of children: Not on file   Years of education: Not on file   Highest education level: Not on file  Occupational History   Not on file  Tobacco Use   Smoking status: Former    Packs/day: 1.00    Years: 8.00    Total pack years: 8.00    Types: Cigarettes    Quit  date: 09/12/1968    Years since quitting: 54.0   Smokeless tobacco: Never   Tobacco comments:    quit smoking cigarettes > 40 years ago  Vaping Use   Vaping Use: Never used  Substance and Sexual Activity   Alcohol use: No   Drug use: No   Sexual activity: Not on file  Other Topics Concern   Not on file  Social History Narrative   Not on file   Social Determinants of Health   Financial Resource Strain: Not on file  Food Insecurity: Not on file  Transportation Needs: Not on file  Physical Activity: Not on file  Stress: Not on file  Social Connections: Not on file     Family  History: The patient's family history includes Heart attack in his father.  ROS:   Please see the history of present illness.  All other systems reviewed and are negative.  Labs/Other Studies Reviewed:    The following studies were reviewed today:  Echo 09/28/21  1. Left ventricular ejection fraction, by estimation, is 45 to 50%. The  left ventricle has mildly decreased function. The left ventricle  demonstrates global hypokinesis. Left ventricular diastolic parameters are  indeterminate.   2. Right ventricular systolic function is normal. The right ventricular  size is normal. There is moderately elevated pulmonary artery systolic  pressure. The estimated right ventricular systolic pressure is 54.0 mmHg.   3. Left atrial size was moderately dilated.   4. The mitral valve is normal in structure. Mild mitral valve  regurgitation. No evidence of mitral stenosis.   5. The aortic valve is calcified. There is moderate calcification of the  aortic valve. Aortic valve regurgitation is mild. Mild to moderate aortic  valve sclerosis/calcification is present, without any evidence of aortic  stenosis.   6. The inferior vena cava is normal in size with greater than 50%  respiratory variability, suggesting right atrial pressure of 3 mmHg.    Recent Labs: 09/27/2021: B Natriuretic Peptide 464.8 09/28/2021:  TSH 1.377 09/29/2021: Magnesium 2.4 10/01/2021: ALT 16 11/04/2021: BUN 29; Creatinine, Ser 1.33; Hemoglobin 11.4; Platelets 166; Potassium 4.0; Sodium 140  Recent Lipid Panel    Component Value Date/Time   CHOL 131 06/03/2021 1131   TRIG 72 06/03/2021 1131   HDL 79 06/03/2021 1131   CHOLHDL 1.7 06/03/2021 1131   CHOLHDL 2.3 10/15/2016 1215   VLDL 34 (H) 10/15/2016 1215   LDLCALC 38 06/03/2021 1131     Risk Assessment/Calculations:    CHA2DS2-VASc Score = 6  {This indicates a 9.7% annual risk of stroke. The patient's score is based upon: CHF History: 1 HTN History: 1 Diabetes History: 1 Stroke History: 0 Vascular Disease History: 1 Age Score: 2 Gender Score: 0    Physical Exam:    VS:  BP 116/70 (BP Location: Left Arm, Patient Position: Sitting, Cuff Size: Normal)   Pulse 91   Ht 5' 5.5" (1.664 m)   Wt 134 lb (60.8 kg)   SpO2 99%   BMI 21.96 kg/m     Wt Readings from Last 3 Encounters:  09/09/22 134 lb (60.8 kg)  02/15/22 138 lb 6.4 oz (62.8 kg)  01/27/22 142 lb 3.2 oz (64.5 kg)     GEN:  Well nourished, well developed in no acute distress HEENT: Normal NECK: No JVD; right carotid bruit CARDIAC: Irregular RR, no murmurs, rubs, gallops RESPIRATORY:  Clear to auscultation without rales, wheezing or rhonchi  ABDOMEN: Soft, non-tender, non-distended MUSCULOSKELETAL:  No edema; No deformity. 2+ pedal pulses, equal bilaterally SKIN: Warm and dry NEUROLOGIC:  Alert and oriented x 3 PSYCHIATRIC:  Normal affect   EKG:  EKG is ordered today.  The ekg ordered today demonstrates atrial fibrillation with well-controlled ventricular rate at 91 bpm, incomplete LBBB   Diagnoses:    1. Coronary artery disease involving native coronary artery of native heart without angina pectoris   2. Hyperlipidemia LDL goal <70   3. Chronic combined systolic and diastolic CHF (congestive heart failure) (HCC)   4. Persistent atrial fibrillation (HCC)   5. Essential hypertension   6.  Carotid stenosis, asymptomatic, right   7. Mild aortic insufficiency   8. Mild mitral regurgitation   9. Secondary hypercoagulable state (HCC)  Assessment and Plan:     CAD without angina: Remote stent to LAD 2009. He denies chest pain, dyspnea, or other symptoms concerning for angina.  No indication for further ischemic evaluation at this time.  Has been maintained on aspirin.  Minor skin bleeding not particularly burdensome to him. Hgb 11.2 on 04/12/22.  Advised him to notify us if he notices evidence of bleeding (melena, hematuria) or if skin bleeding worsens.   Chronic combined CHF/Cardiomyopathy: LVEF 45 to 50%, global hypokinesis, indeterminate diastolic parameters on echo 09/2021. He denies dyspnea, orthopnea, edema, or PND. Appears euvolemic on exam.   Hypertension: BP is well-controlled.  He checks occasionally at home.  No concerns over recent BP readings.  Persistent atrial fibrillation on chronic anticoagulation: Strategy of rate control for persistent A-fib.  Rate is well controlled today.  He occasionally feels HR speed up, this is not bothersome to him.  No evidence of volume overload on exam today. Currently on 5 mg BID. Weight today is 60.8 kg and he is > 56 y/o. We need to monitor this closely.  Checking labs for kidney function today. Will see him back in 6 months. Continue Eliquis, metoprolol.  Carotid artery disease: Right carotid bruit. Stable on carotid duplex 09/2021 with 40-59% on right and 1-39% on left. Followed by [facility]. Due for follow-up, there is a recall for October 2023.   Hyperlipidemia LDL goal < 70: LDL 38 on 06/03/2021.  We will recheck today.  Valve disease: Mild MR, moderate calcification of aortic valve with mild AI, mild to moderate aortic sclerosis without evidence of aortic stenosis by echo 09/28/2021.  He is asymptomatic today.  We will repeat echocardiogram when clinically indicated.    Disposition: 6 months with Dr. Acie Fredrickson or APP  Medication  Adjustments/Labs and Tests Ordered: Current medicines are reviewed at length with the patient today.  Concerns regarding medicines are outlined above.  Orders Placed This Encounter  Procedures   Comp Met (CMET)   Lipid Profile   EKG 12-Lead   No orders of the defined types were placed in this encounter.   Patient Instructions  Medication Instructions:   Your physician recommends that you continue on your current medications as directed. Please refer to the Current Medication list given to you today.   *If you need a refill on your cardiac medications before your next appointment, please call your pharmacy*   Lab Work:  TODAY!!!!! LIPID/CMET  If you have labs (blood work) drawn today and your tests are completely normal, you will receive your results only by: [place] (if you have MyChart) OR A paper copy in the mail If you have any lab test that is abnormal or we need to change your treatment, we will call you to review the results.   Testing/Procedures:  None ordered.   Follow-Up: At [facility], you and your health needs are our priority.  As part of our continuing mission to provide you with exceptional heart care, we have created designated Provider Care Teams.  These Care Teams include your primary Cardiologist (physician) and Advanced Practice Providers (APPs -  Physician Assistants and Nurse Practitioners) who all work together to provide you with the care you need, when you need it.  We recommend signing up for the patient portal called "MyChart".  Sign up information is provided on this After Visit Summary.  MyChart is used to connect with patients for Virtual Visits (Telemedicine).  Patients are able to view lab/test results, encounter notes, upcoming appointments,  etc.  Non-urgent messages can be sent to your provider as well.   To learn more about what you can do with MyChart, go to [email or web address].    Your next appointment:   6  month(s)  The format for your next appointment:   In Person  Provider:   [name], MD     Important Information About Sugar         Signed, Emmaline Life, NP  09/09/2022 1:33 PM    [place]

## 2022-09-09 ENCOUNTER — Ambulatory Visit: Payer: Medicare Other | Attending: Nurse Practitioner | Admitting: Nurse Practitioner

## 2022-09-09 ENCOUNTER — Encounter: Payer: Self-pay | Admitting: Nurse Practitioner

## 2022-09-09 VITALS — BP 116/70 | HR 91 | Ht 65.5 in | Wt 134.0 lb

## 2022-09-09 DIAGNOSIS — D6869 Other thrombophilia: Secondary | ICD-10-CM

## 2022-09-09 DIAGNOSIS — I4891 Unspecified atrial fibrillation: Secondary | ICD-10-CM | POA: Diagnosis not present

## 2022-09-09 DIAGNOSIS — I340 Nonrheumatic mitral (valve) insufficiency: Secondary | ICD-10-CM

## 2022-09-09 DIAGNOSIS — I10 Essential (primary) hypertension: Secondary | ICD-10-CM

## 2022-09-09 DIAGNOSIS — E785 Hyperlipidemia, unspecified: Secondary | ICD-10-CM

## 2022-09-09 DIAGNOSIS — I6521 Occlusion and stenosis of right carotid artery: Secondary | ICD-10-CM

## 2022-09-09 DIAGNOSIS — I5042 Chronic combined systolic (congestive) and diastolic (congestive) heart failure: Secondary | ICD-10-CM

## 2022-09-09 DIAGNOSIS — I4819 Other persistent atrial fibrillation: Secondary | ICD-10-CM

## 2022-09-09 DIAGNOSIS — I2510 Atherosclerotic heart disease of native coronary artery without angina pectoris: Secondary | ICD-10-CM | POA: Diagnosis not present

## 2022-09-09 DIAGNOSIS — I351 Nonrheumatic aortic (valve) insufficiency: Secondary | ICD-10-CM

## 2022-09-09 LAB — LIPID PANEL
Chol/HDL Ratio: 1.6 ratio (ref 0.0–5.0)
Cholesterol, Total: 137 mg/dL (ref 100–199)
HDL: 88 mg/dL (ref >39)
LDL Chol Calc (NIH): 33 mg/dL (ref 0–99)
Triglycerides: 83 mg/dL (ref 0–149)
VLDL Cholesterol Cal: 16 mg/dL (ref 5–40)

## 2022-09-09 LAB — COMPREHENSIVE METABOLIC PANEL
ALT: 10 IU/L (ref 0–44)
AST: 20 IU/L (ref 0–40)
Albumin/Globulin Ratio: 2.3 — ABNORMAL HIGH (ref 1.2–2.2)
Albumin: 4.4 g/dL (ref 3.7–4.7)
Alkaline Phosphatase: 61 IU/L (ref 44–121)
BUN/Creatinine Ratio: 13 (ref 10–24)
BUN: 16 mg/dL (ref 8–27)
Bilirubin Total: 0.4 mg/dL (ref 0.0–1.2)
CO2: 30 mmol/L — ABNORMAL HIGH (ref 20–29)
Calcium: 9.5 mg/dL (ref 8.6–10.2)
Chloride: 98 mmol/L (ref 96–106)
Creatinine, Ser: 1.24 mg/dL (ref 0.76–1.27)
Globulin, Total: 1.9 g/dL (ref 1.5–4.5)
Glucose: 131 mg/dL — ABNORMAL HIGH (ref 70–99)
Potassium: 4.6 mmol/L (ref 3.5–5.2)
Sodium: 139 mmol/L (ref 134–144)
Total Protein: 6.3 g/dL (ref 6.0–8.5)
eGFR: 56 mL/min/{1.73_m2} — ABNORMAL LOW (ref >59)

## 2022-09-09 NOTE — Patient Instructions (Signed)
Medication Instructions:   Your physician recommends that you continue on your current medications as directed. Please refer to the Current Medication list given to you today.   *If you need a refill on your cardiac medications before your next appointment, please call your pharmacy*   Lab Work:  TODAY!!!!! LIPID/CMET  If you have labs (blood work) drawn today and your tests are completely normal, you will receive your results only by: [place] (if you have MyChart) OR A paper copy in the mail If you have any lab test that is abnormal or we need to change your treatment, we will call you to review the results.   Testing/Procedures:  None ordered.   Follow-Up: At [facility], you and your health needs are our priority.  As part of our continuing mission to provide you with exceptional heart care, we have created designated Provider Care Teams.  These Care Teams include your primary Cardiologist (physician) and Advanced Practice Providers (APPs -  Physician Assistants and Nurse Practitioners) who all work together to provide you with the care you need, when you need it.  We recommend signing up for the patient portal called "MyChart".  Sign up information is provided on this After Visit Summary.  MyChart is used to connect with patients for Virtual Visits (Telemedicine).  Patients are able to view lab/test results, encounter notes, upcoming appointments, etc.  Non-urgent messages can be sent to your provider as well.   To learn more about what you can do with MyChart, go to [email or web address].    Your next appointment:   6 month(s)  The format for your next appointment:   In Person  Provider:   [name], MD     Important Information About Sugar

## 2022-10-08 DIAGNOSIS — I5032 Chronic diastolic (congestive) heart failure: Secondary | ICD-10-CM | POA: Diagnosis not present

## 2022-10-08 DIAGNOSIS — E0869 Diabetes mellitus due to underlying condition with other specified complication: Secondary | ICD-10-CM | POA: Diagnosis not present

## 2022-10-08 DIAGNOSIS — E7800 Pure hypercholesterolemia, unspecified: Secondary | ICD-10-CM | POA: Diagnosis not present

## 2022-10-08 DIAGNOSIS — G8929 Other chronic pain: Secondary | ICD-10-CM | POA: Diagnosis not present

## 2022-10-08 DIAGNOSIS — I10 Essential (primary) hypertension: Secondary | ICD-10-CM | POA: Diagnosis not present

## 2022-10-08 DIAGNOSIS — J439 Emphysema, unspecified: Secondary | ICD-10-CM | POA: Diagnosis not present

## 2022-11-02 DIAGNOSIS — R29898 Other symptoms and signs involving the musculoskeletal system: Secondary | ICD-10-CM | POA: Diagnosis not present

## 2022-11-02 DIAGNOSIS — I739 Peripheral vascular disease, unspecified: Secondary | ICD-10-CM | POA: Diagnosis not present

## 2022-11-02 DIAGNOSIS — B351 Tinea unguium: Secondary | ICD-10-CM | POA: Diagnosis not present

## 2022-11-02 DIAGNOSIS — E1151 Type 2 diabetes mellitus with diabetic peripheral angiopathy without gangrene: Secondary | ICD-10-CM | POA: Diagnosis not present

## 2022-12-02 DIAGNOSIS — L821 Other seborrheic keratosis: Secondary | ICD-10-CM | POA: Diagnosis not present

## 2022-12-02 DIAGNOSIS — D485 Neoplasm of uncertain behavior of skin: Secondary | ICD-10-CM | POA: Diagnosis not present

## 2022-12-02 DIAGNOSIS — Z85828 Personal history of other malignant neoplasm of skin: Secondary | ICD-10-CM | POA: Diagnosis not present

## 2022-12-02 DIAGNOSIS — L853 Xerosis cutis: Secondary | ICD-10-CM | POA: Diagnosis not present

## 2022-12-02 DIAGNOSIS — D692 Other nonthrombocytopenic purpura: Secondary | ICD-10-CM | POA: Diagnosis not present

## 2022-12-02 DIAGNOSIS — C44529 Squamous cell carcinoma of skin of other part of trunk: Secondary | ICD-10-CM | POA: Diagnosis not present

## 2022-12-02 DIAGNOSIS — L570 Actinic keratosis: Secondary | ICD-10-CM | POA: Diagnosis not present

## 2022-12-20 DIAGNOSIS — J40 Bronchitis, not specified as acute or chronic: Secondary | ICD-10-CM | POA: Diagnosis not present

## 2022-12-20 DIAGNOSIS — I482 Chronic atrial fibrillation, unspecified: Secondary | ICD-10-CM | POA: Diagnosis not present

## 2022-12-20 DIAGNOSIS — E119 Type 2 diabetes mellitus without complications: Secondary | ICD-10-CM | POA: Diagnosis not present

## 2022-12-20 DIAGNOSIS — D6869 Other thrombophilia: Secondary | ICD-10-CM | POA: Diagnosis not present

## 2022-12-20 DIAGNOSIS — I5042 Chronic combined systolic (congestive) and diastolic (congestive) heart failure: Secondary | ICD-10-CM | POA: Diagnosis not present

## 2022-12-25 DIAGNOSIS — J209 Acute bronchitis, unspecified: Secondary | ICD-10-CM | POA: Diagnosis not present

## 2023-01-04 ENCOUNTER — Other Ambulatory Visit: Payer: Self-pay | Admitting: Cardiovascular Disease

## 2023-01-18 ENCOUNTER — Other Ambulatory Visit: Payer: Self-pay | Admitting: Cardiovascular Disease

## 2023-01-19 DIAGNOSIS — H04123 Dry eye syndrome of bilateral lacrimal glands: Secondary | ICD-10-CM | POA: Diagnosis not present

## 2023-01-19 DIAGNOSIS — H2513 Age-related nuclear cataract, bilateral: Secondary | ICD-10-CM | POA: Diagnosis not present

## 2023-01-19 DIAGNOSIS — H0102A Squamous blepharitis right eye, upper and lower eyelids: Secondary | ICD-10-CM | POA: Diagnosis not present

## 2023-01-19 DIAGNOSIS — E119 Type 2 diabetes mellitus without complications: Secondary | ICD-10-CM | POA: Diagnosis not present

## 2023-01-19 DIAGNOSIS — H0102B Squamous blepharitis left eye, upper and lower eyelids: Secondary | ICD-10-CM | POA: Diagnosis not present

## 2023-01-26 DIAGNOSIS — R29898 Other symptoms and signs involving the musculoskeletal system: Secondary | ICD-10-CM | POA: Diagnosis not present

## 2023-01-26 DIAGNOSIS — E1151 Type 2 diabetes mellitus with diabetic peripheral angiopathy without gangrene: Secondary | ICD-10-CM | POA: Diagnosis not present

## 2023-01-26 DIAGNOSIS — I739 Peripheral vascular disease, unspecified: Secondary | ICD-10-CM | POA: Diagnosis not present

## 2023-01-27 DIAGNOSIS — J439 Emphysema, unspecified: Secondary | ICD-10-CM | POA: Diagnosis not present

## 2023-01-27 DIAGNOSIS — I10 Essential (primary) hypertension: Secondary | ICD-10-CM | POA: Diagnosis not present

## 2023-01-27 DIAGNOSIS — D6869 Other thrombophilia: Secondary | ICD-10-CM | POA: Diagnosis not present

## 2023-01-27 DIAGNOSIS — E119 Type 2 diabetes mellitus without complications: Secondary | ICD-10-CM | POA: Diagnosis not present

## 2023-01-27 DIAGNOSIS — I700 Atherosclerosis of aorta: Secondary | ICD-10-CM | POA: Diagnosis not present

## 2023-01-27 DIAGNOSIS — Z0000 Encounter for general adult medical examination without abnormal findings: Secondary | ICD-10-CM | POA: Diagnosis not present

## 2023-01-27 DIAGNOSIS — L299 Pruritus, unspecified: Secondary | ICD-10-CM | POA: Diagnosis not present

## 2023-01-27 DIAGNOSIS — I130 Hypertensive heart and chronic kidney disease with heart failure and stage 1 through stage 4 chronic kidney disease, or unspecified chronic kidney disease: Secondary | ICD-10-CM | POA: Diagnosis not present

## 2023-01-27 DIAGNOSIS — I482 Chronic atrial fibrillation, unspecified: Secondary | ICD-10-CM | POA: Diagnosis not present

## 2023-01-27 DIAGNOSIS — I2510 Atherosclerotic heart disease of native coronary artery without angina pectoris: Secondary | ICD-10-CM | POA: Diagnosis not present

## 2023-01-27 DIAGNOSIS — N1831 Chronic kidney disease, stage 3a: Secondary | ICD-10-CM | POA: Diagnosis not present

## 2023-01-27 DIAGNOSIS — I5042 Chronic combined systolic (congestive) and diastolic (congestive) heart failure: Secondary | ICD-10-CM | POA: Diagnosis not present

## 2023-01-27 DIAGNOSIS — E7800 Pure hypercholesterolemia, unspecified: Secondary | ICD-10-CM | POA: Diagnosis not present

## 2023-02-10 DIAGNOSIS — I482 Chronic atrial fibrillation, unspecified: Secondary | ICD-10-CM | POA: Diagnosis not present

## 2023-02-10 DIAGNOSIS — E119 Type 2 diabetes mellitus without complications: Secondary | ICD-10-CM | POA: Diagnosis not present

## 2023-02-10 DIAGNOSIS — E7800 Pure hypercholesterolemia, unspecified: Secondary | ICD-10-CM | POA: Diagnosis not present

## 2023-02-10 DIAGNOSIS — J439 Emphysema, unspecified: Secondary | ICD-10-CM | POA: Diagnosis not present

## 2023-02-10 DIAGNOSIS — I10 Essential (primary) hypertension: Secondary | ICD-10-CM | POA: Diagnosis not present

## 2023-02-10 DIAGNOSIS — N1831 Chronic kidney disease, stage 3a: Secondary | ICD-10-CM | POA: Diagnosis not present

## 2023-02-10 DIAGNOSIS — I5032 Chronic diastolic (congestive) heart failure: Secondary | ICD-10-CM | POA: Diagnosis not present

## 2023-02-17 ENCOUNTER — Encounter (HOSPITAL_COMMUNITY): Payer: Self-pay

## 2023-02-17 ENCOUNTER — Emergency Department (HOSPITAL_COMMUNITY): Payer: Medicare Other

## 2023-02-17 ENCOUNTER — Other Ambulatory Visit: Payer: Self-pay

## 2023-02-17 ENCOUNTER — Emergency Department (HOSPITAL_COMMUNITY)
Admission: EM | Admit: 2023-02-17 | Discharge: 2023-02-17 | Disposition: A | Discharge status: Home / Self Care | Payer: Medicare Other | Attending: Emergency Medicine | Admitting: Emergency Medicine

## 2023-02-17 DIAGNOSIS — Z1152 Encounter for screening for COVID-19: Secondary | ICD-10-CM | POA: Insufficient documentation

## 2023-02-17 DIAGNOSIS — E1122 Type 2 diabetes mellitus with diabetic chronic kidney disease: Secondary | ICD-10-CM | POA: Diagnosis not present

## 2023-02-17 DIAGNOSIS — Z8673 Personal history of transient ischemic attack (TIA), and cerebral infarction without residual deficits: Secondary | ICD-10-CM | POA: Diagnosis not present

## 2023-02-17 DIAGNOSIS — R4781 Slurred speech: Secondary | ICD-10-CM | POA: Diagnosis not present

## 2023-02-17 DIAGNOSIS — Z7902 Long term (current) use of antithrombotics/antiplatelets: Secondary | ICD-10-CM | POA: Diagnosis not present

## 2023-02-17 DIAGNOSIS — I5042 Chronic combined systolic (congestive) and diastolic (congestive) heart failure: Secondary | ICD-10-CM | POA: Insufficient documentation

## 2023-02-17 DIAGNOSIS — Z7984 Long term (current) use of oral hypoglycemic drugs: Secondary | ICD-10-CM | POA: Insufficient documentation

## 2023-02-17 DIAGNOSIS — I130 Hypertensive heart and chronic kidney disease with heart failure and stage 1 through stage 4 chronic kidney disease, or unspecified chronic kidney disease: Secondary | ICD-10-CM | POA: Diagnosis not present

## 2023-02-17 DIAGNOSIS — R6889 Other general symptoms and signs: Secondary | ICD-10-CM | POA: Diagnosis not present

## 2023-02-17 DIAGNOSIS — N183 Chronic kidney disease, stage 3 unspecified: Secondary | ICD-10-CM | POA: Insufficient documentation

## 2023-02-17 DIAGNOSIS — I499 Cardiac arrhythmia, unspecified: Secondary | ICD-10-CM | POA: Diagnosis not present

## 2023-02-17 DIAGNOSIS — J449 Chronic obstructive pulmonary disease, unspecified: Secondary | ICD-10-CM | POA: Diagnosis not present

## 2023-02-17 DIAGNOSIS — J439 Emphysema, unspecified: Secondary | ICD-10-CM | POA: Diagnosis not present

## 2023-02-17 DIAGNOSIS — Z7901 Long term (current) use of anticoagulants: Secondary | ICD-10-CM | POA: Insufficient documentation

## 2023-02-17 DIAGNOSIS — I4819 Other persistent atrial fibrillation: Secondary | ICD-10-CM | POA: Insufficient documentation

## 2023-02-17 DIAGNOSIS — R42 Dizziness and giddiness: Secondary | ICD-10-CM

## 2023-02-17 DIAGNOSIS — I2510 Atherosclerotic heart disease of native coronary artery without angina pectoris: Secondary | ICD-10-CM | POA: Insufficient documentation

## 2023-02-17 DIAGNOSIS — Z955 Presence of coronary angioplasty implant and graft: Secondary | ICD-10-CM | POA: Insufficient documentation

## 2023-02-17 DIAGNOSIS — R7989 Other specified abnormal findings of blood chemistry: Secondary | ICD-10-CM | POA: Insufficient documentation

## 2023-02-17 DIAGNOSIS — Z79899 Other long term (current) drug therapy: Secondary | ICD-10-CM | POA: Insufficient documentation

## 2023-02-17 DIAGNOSIS — R531 Weakness: Secondary | ICD-10-CM | POA: Diagnosis not present

## 2023-02-17 DIAGNOSIS — D631 Anemia in chronic kidney disease: Secondary | ICD-10-CM | POA: Diagnosis not present

## 2023-02-17 DIAGNOSIS — Z743 Need for continuous supervision: Secondary | ICD-10-CM | POA: Diagnosis not present

## 2023-02-17 LAB — URINALYSIS, W/ REFLEX TO CULTURE (INFECTION SUSPECTED)
Bilirubin Urine: NEGATIVE
Glucose, UA: NEGATIVE mg/dL
Hgb urine dipstick: NEGATIVE
Ketones, ur: NEGATIVE mg/dL
Leukocytes,Ua: NEGATIVE
Nitrite: NEGATIVE
Protein, ur: NEGATIVE mg/dL
Specific Gravity, Urine: 1.005 (ref 1.005–1.030)
pH: 5 (ref 5.0–8.0)

## 2023-02-17 LAB — COMPREHENSIVE METABOLIC PANEL
ALT: 19 U/L (ref 0–44)
AST: 26 U/L (ref 15–41)
Albumin: 4.3 g/dL (ref 3.5–5.0)
Alkaline Phosphatase: 46 U/L (ref 38–126)
Anion gap: 8 (ref 5–15)
BUN: 34 mg/dL — ABNORMAL HIGH (ref 8–23)
CO2: 27 mmol/L (ref 22–32)
Calcium: 8.8 mg/dL — ABNORMAL LOW (ref 8.9–10.3)
Chloride: 105 mmol/L (ref 98–111)
Creatinine, Ser: 1.08 mg/dL (ref 0.61–1.24)
GFR, Estimated: >60 mL/min (ref >60)
Glucose, Bld: 138 mg/dL — ABNORMAL HIGH (ref 70–99)
Potassium: 3.9 mmol/L (ref 3.5–5.1)
Sodium: 140 mmol/L (ref 135–145)
Total Bilirubin: 0.9 mg/dL (ref 0.3–1.2)
Total Protein: 7 g/dL (ref 6.5–8.1)

## 2023-02-17 LAB — CBC WITH DIFFERENTIAL/PLATELET
Abs Immature Granulocytes: 0.02 10*3/uL (ref 0.00–0.07)
Basophils Absolute: 0.1 10*3/uL (ref 0.0–0.1)
Basophils Relative: 1 %
Eosinophils Absolute: 0.2 10*3/uL (ref 0.0–0.5)
Eosinophils Relative: 3 %
HCT: 34.3 % — ABNORMAL LOW (ref 39.0–52.0)
Hemoglobin: 10.8 g/dL — ABNORMAL LOW (ref 13.0–17.0)
Immature Granulocytes: 0 %
Lymphocytes Relative: 21 %
Lymphs Abs: 1.1 10*3/uL (ref 0.7–4.0)
MCH: 32 pg (ref 26.0–34.0)
MCHC: 31.5 g/dL (ref 30.0–36.0)
MCV: 101.8 fL — ABNORMAL HIGH (ref 80.0–100.0)
Monocytes Absolute: 0.3 10*3/uL (ref 0.1–1.0)
Monocytes Relative: 6 %
Neutro Abs: 3.7 10*3/uL (ref 1.7–7.7)
Neutrophils Relative %: 69 %
Platelets: 155 10*3/uL (ref 150–400)
RBC: 3.37 MIL/uL — ABNORMAL LOW (ref 4.22–5.81)
RDW: 15.7 % — ABNORMAL HIGH (ref 11.5–15.5)
WBC: 5.3 10*3/uL (ref 4.0–10.5)
nRBC: 0 % (ref 0.0–0.2)

## 2023-02-17 LAB — RESP PANEL BY RT-PCR (RSV, FLU A&B, COVID)  RVPGX2
Influenza A by PCR: NEGATIVE
Influenza B by PCR: NEGATIVE
Resp Syncytial Virus by PCR: NEGATIVE
SARS Coronavirus 2 by RT PCR: NEGATIVE

## 2023-02-17 LAB — MAGNESIUM: Magnesium: 1.9 mg/dL (ref 1.7–2.4)

## 2023-02-17 NOTE — ED Provider Notes (Signed)
[facility] Provider Note   CSN: [id number] Arrival date & time: 02/17/23  [id number]     History  Chief Complaint  Patient presents with   Weakness    Todd Armstrong is a 89 y.o. male with HTN, HLD, CAD w/ h/o MI LAD stent 2009, pulmonary HTN, T2DM, COPD, chronic combined/systolic HF (LVEF [id number]), carotid stenosis s/p R CEA 2019, persistent Afib on eliquis, CKD stage 3, chronic anemia, h/o CVA who presents with gen weakness.   Patient brought in by EMS due to generalized weakness and dizziness that he noticed when he got up out of bed this morning. Per EMS patient reported that he went to bed last night feeling fine, woke up around 0430 this morning unable to walk normally to the bathroom to urinate. States he had to use objects in order to get to the bathroom and kitchen.  Did not pass out or lose consciousness.  Did not fall or hit his head.  No history of similar.  Patient is noted to have slow speech, and he reports that his speech is normal, and caused by some dental work.daughter at bedside verified that patient's speech is his baseline.  Daughter does note that patient has been coughing for several weeks and was recently treated with steroids which improved his symptoms.  He also recently had a medication adjustment, Eliquis was decreased from 5 mg twice daily to 2.5 mg twice daily.  Patient denies fever/chills, chest pain, shortness of breath, abdominal pain, nausea vomiting diarrhea constipation, urinary symptoms, lower extremity edema, asymmetric weakness, numbness/tingling, confusion.  Patient states that right now he is feeling improved.  He has no headache, lightheadedness, vertigo.  Patient lives by himself and does not normally utilize a cane or walker.   Weakness      Home Medications Prior to Admission medications   Medication Sig Start Date End Date Taking? Authorizing Provider  Accu-Chek FastClix Lancets MISC For use  when checking blood sugars 10/06/21   [provider]  albuterol (VENTOLIN HFA) 108 (90 Base) MCG/ACT inhaler Inhale 2 puffs into the lungs every 6 (six) hours as needed for wheezing or shortness of breath. 03/25/21   Brand Males, MD  clopidogrel (PLAVIX) 75 MG tablet TAKE 1 TABLET BY MOUTH DAILY 01/19/23   Nahser, Wonda Cheng, MD  Dextromethorphan HBr 15 MG/5ML LIQD Take 5 mLs (15 mg total) by mouth 3 (three) times daily as needed. 02/14/22   Francene Finders, PA-C  ELIQUIS 5 MG TABS tablet TAKE 1 TABLET BY MOUTH TWICE  DAILY 06/01/22   Nahser, Wonda Cheng, MD  metFORMIN (GLUCOPHAGE) 1000 MG tablet Take 1,000 mg by mouth 2 (two) times daily with a meal.    [provider]  metoprolol tartrate (LOPRESSOR) 25 MG tablet Take 0.5 tablets (12.5 mg total) by mouth 2 (two) times daily. 10/01/21   Donne Hazel, MD  nitroGLYCERIN (NITROSTAT) 0.4 MG SL tablet Place 1 tablet (0.4 mg total) under the tongue every 5 (five) minutes as needed for chest pain. 10/17/19   Nahser, Wonda Cheng, MD  potassium chloride (KLOR-CON) 10 MEQ tablet Take 1 tablet (10 mEq total) by mouth 2 (two) times daily. 10/01/21   Donne Hazel, MD  rosuvastatin (CRESTOR) 10 MG tablet Take 1 tablet (10 mg total) by mouth daily. Please keep scheduled appointment for future refills. Thank you. 01/04/23   Nahser, Wonda Cheng, MD  tamsulosin (FLOMAX) 0.4 MG CAPS capsule Take 0.4 mg by  mouth at bedtime.  04/07/20   [provider]  Tiotropium Bromide Monohydrate (SPIRIVA RESPIMAT) 2.5 MCG/ACT AERS Inhale 2 puffs into the lungs daily. 04/21/21   Spero Geralds, MD  torsemide (DEMADEX) 20 MG tablet Take 1 tablet (20 mg total) by mouth daily. 10/01/21   Donne Hazel, MD  triamcinolone cream (KENALOG) 0.1 % Apply 1 application topically as needed (for itching). 07/22/20   [provider]      Allergies    Patient has no known allergies.    Review of Systems   Review of Systems  Neurological:  Positive for weakness.    Review of systems Negative for f/c.  A 10 point review of systems was performed and is negative unless otherwise reported in HPI.  Physical Exam Updated Vital Signs BP (!) 123/93   Pulse 84   Temp (!) 97.5 F (36.4 C) (Oral)   Resp 14   Ht '5\' 9"'$  (1.753 m)   Wt 60.8 kg   SpO2 98%   BMI 19.79 kg/m  Physical Exam General: Normal appearing male, lying in bed.  HEENT: PERRLA, EOMI, no nystagmus, Sclera anicteric, MMM, trachea midline. Tongue protrudes midline. Cardiology: Irregular rhythm, regular rate, no murmurs/rubs/gallops. BL radial and DP pulses equal bilaterally.  Resp: Normal respiratory rate and effort. CTAB, no wheezes, rhonchi, crackles.  Abd: Soft, non-tender, non-distended. No rebound tenderness or guarding.  GU: Deferred. MSK: No peripheral edema or signs of trauma. Extremities without deformity or TTP. No cyanosis or clubbing. Skin: warm, dry. No rashes or lesions. Back: No CVA tenderness Neuro: A&Ox4, CNs II-XII grossly intact. 5/5 strength all extremities. Sensation grossly intact. Slow speech. Normal coordination. Gait is slow but steady. Psych: Normal mood and affect.   ED Results / Procedures / Treatments   Labs (all labs ordered are listed, but only abnormal results are displayed) Labs Reviewed  CBC WITH DIFFERENTIAL/PLATELET - Abnormal; Notable for the following components:      Result Value   RBC 3.37 (*)    Hemoglobin 10.8 (*)    HCT 34.3 (*)    MCV 101.8 (*)    RDW 15.7 (*)    All other components within normal limits  COMPREHENSIVE METABOLIC PANEL - Abnormal; Notable for the following components:   Glucose, Bld 138 (*)    BUN 34 (*)    Calcium 8.8 (*)    All other components within normal limits  URINALYSIS, W/ REFLEX TO CULTURE (INFECTION SUSPECTED) - Abnormal; Notable for the following components:   Color, Urine COLORLESS (*)    Bacteria, UA RARE (*)    All other components within normal limits  RESP PANEL BY RT-PCR (RSV, FLU A&B, COVID)   [id number]  MAGNESIUM    EKG EKG Interpretation  Date/Time:  Thursday February 17 2023 08:49:00 EST Ventricular Rate:  117 PR Interval:    QRS Duration: 106 QT Interval:  360 QTC Calculation: 503 R Axis:   68 Text Interpretation: Atrial fibrillation Nonspecific repol abnormality, diffuse leads Prolonged QT interval Confirmed by Cindee Lame [phone]) on 02/17/2023 9:23:35 AM  Radiology DG Chest 2 View  Result Date: 02/17/2023 CLINICAL DATA:  generalized weakness. Per EMS patient reported that he went to bed last night feeling fine, woke up around 0430 this morning unable to walk normally to the bathroom to urinate. EXAM: CHEST - 2 VIEW COMPARISON:  04/12/2022 FINDINGS: Pulmonary emphysema. Chronic scarring in the right lower lung. No new infiltrate or overt edema. Heart size and mediastinal contours are within  normal limits. No effusion. Chronic right rib deformities.  No acute fracture. IMPRESSION: Emphysema and chronic changes. No acute findings. Electronically Signed   By: Lucrezia Europe M.D.   On: 02/17/2023 10:29    Procedures Procedures    Medications Ordered in ED Medications - No data to display  ED Course/ Medical Decision Making/ A&P                          Medical Decision Making Amount and/or Complexity of Data Reviewed Labs: ordered. Decision-making details documented in ED Course. Radiology: ordered. Decision-making details documented in ED Course.    This patient presents to the ED for concern of dizziness, this involves an extensive number of treatment options, and is a complaint that carries with it a high risk of complications and morbidity.  I considered the following differential and admission for this acute, potentially life threatening condition. However patient is overall well-appearing.  MDM:    DDX for generalized weakness includes but is not limited to:  Infectious processes such as PNA given report of cough though no f/c; severe metabolic derangements or  electrolyte abnormalities, ischemia/ACS - low c/f given no CP or SOB; does have Afib w/ RVR on initial EKG however patient took his metoprolol this AM prior to arrival and his HR came down without any intervention; heart failure though no SOB/crackles, no LEE; anemia, and intracranial/central processes but think these are unlikely given the history and physical exam. No nystagmus or abnormalities of coordination/gait to suggest CVA or NPH. No urinary symptoms to suggest UTI but will check urine. Consider also dehydration given report of dizziness with standing, though patient has MMM and does not appear significantly volume down.    Clinical Course as of 02/17/23 1044  Thu Feb 17, 2023  [id number] EKG shows Afib w/ RVR rate 117 bpm. Patient is on eliquis.  [HN]  [id number] HR now in 80s without treatment [HN]  1038 Magnesium: 1.9 [HN]  1038 Resp panel by RT-PCR (RSV, Flu A&B, Covid) Anterior Nasal Swab Neg [HN]  1038 Comprehensive metabolic panel(!) Mildly elevated BUN, possible dehydration [HN]  1039 Hemoglobin(!): 10.8 At relative baseline (10-11) [HN]  1039 WBC: 5.3 No leukocytosis [HN]  1039 Urinalysis, w/ Reflex to Culture (Infection Suspected) -Urine, Clean Catch(!) No UTI [HN]  1039 DG Chest 2 View Emphysema and chronic changes. No acute findings. [HN]  [id number] Patient reevaluated. He states he feels improved. Observed to ambulate about the room without assistance and states he does not feel that wobbliness or dizziness anymore. Patient with no FNDs, speech at baseline. HDS. No Afib w/ RVR. Took all medications this AM. I am inclined to send this patient home to DC w/ o/p f/u with PCP within 1 week. Paitent and daughter agree with plan. All questions answered to their satisfaction. Given extensive return precautions. [HN]    Clinical Course User Index [HN] Audley Hose, MD    Labs: I Ordered, and personally interpreted labs.  The pertinent results include:  those listed above  Imaging Studies  ordered: I ordered imaging studies including CXR I independently visualized and interpreted imaging. I agree with the radiologist interpretation  Additional history obtained from chart review.  Cardiac Monitoring: The patient was maintained on a cardiac monitor.  I personally viewed and interpreted the cardiac monitored which showed an underlying rhythm of: afib, rate controlled  Reevaluation: After the interventions noted above, I reevaluated the patient and found that they have :resolved  Social Determinants of Health: Patient lives independently   Disposition:  DC w/ discharge instructions/return precautions. All questions answered to patient's satisfaction.  Instructed to stay well hydrated at home.  Co morbidities that complicate the patient evaluation  Past Medical History:  Diagnosis Date   Acute myocardial infarction    Anemia    Aortic insufficiency    Carotid stenosis    Chronic combined systolic and diastolic CHF (congestive heart failure) (HCC)    Coronary artery disease    PTCA and stenting of his right coronary artery. 3.5 x 16-mm Liberte stent.               Diabetes mellitus without complication ([place])    Dyslipidemia    Emphysema (subcutaneous) (surgical) resulting from a procedure    Hyperlipemia    Hypertension    Mitral regurgitation    Partial tear of rotator cuff    right shoulder   Peripheral vascular disease (HCC)    Persistent atrial fibrillation ([place])    Pulmonary hypertension ([place])    Stroke ([place])    Wears dentures      Medicines No orders of the defined types were placed in this encounter.   I have reviewed the patients home medicines and have made adjustments as needed  Problem List / ED Course: Problem List Items Addressed This Visit   None Visit Diagnoses     Dizziness    -  Primary                   This note was created using dictation software, which may contain spelling or grammatical errors.    Audley Hose, MD 02/17/23 1044

## 2023-02-17 NOTE — Discharge Instructions (Signed)
Thank you for coming to [facility] Emergency Department. You were seen for dizziness/lightheadedness that resolved. We did an exam, labs, and imaging, and these showed no acute findings. It is possible you are dehydrated. Please stay well hydrated at home. Please follow up with your primary care provider within 1 week.   Do not hesitate to return to the ED or call 911 if you experience: -Worsening symptoms -Room-spinning vertigo -Numbness/tingling, asymmetric weakness, confusion, trouble swallowing/speaking -Lightheadedness, passing out -Fevers/chills -Anything else that concerns you

## 2023-02-17 NOTE — ED Triage Notes (Signed)
Patient brought in by EMS due to generalized weakness. Per EMS patient reported that he went to bed last night feeling fine, woke up around 0430 this morning unable to walk normally to the bathroom to urinate. States he had to use objects in order to get to the bathroom and kitchen. Patient reports that his speech is normal, and caused by some dental work.Family verified that patient's speech is his baseline. Pt has no other complaints except weakness.

## 2023-02-20 ENCOUNTER — Encounter: Payer: Self-pay | Admitting: Cardiovascular Disease

## 2023-02-20 NOTE — Progress Notes (Unsigned)
Cardiology Office Note   Date:  02/23/2023   ID:  Todd Armstrong, DOB 25-Dec-1930, MRN [id number]  PCP:  System, Provider Not In  Cardiologist:   [name], MD   Chief Complaint  Patient presents with   Congestive Heart Failure   Atrial Fibrillation        Coronary Artery Disease        1. CAD, s/p LAD stent 2009 2. HTN 3. Diabetes Mellitus 4. Hyperlipidemia 5. Carotid artery stenosis  - Right - mod- severe    [name] is doing well from a cardiac standpoint. He has not had any episodes of chest pain or shortness breath. [name] been exercising on a regular basis.  He had a nice garden this year.    Apr 26, 2013:  Todd Armstrong is doing well.  No CP , no dyspnea.   He has put in his garden this year.    Nov. 12, 2014:  Apr 24, 2014:  Todd Armstrong is doing well.   No CP , no dyspnea.    hes getting his garden going.     Nov. 9, 2015:  Todd Armstrong is doing well.  His BP was elevated at his last visit.  We increased his Lisinopril/HCTZ and his Bp is much better.    May 15, 2015:   Todd Armstrong is a 89 y.o. male who presents for his CAD Doing well.   No CP or dyspnea.  Still caring for his wife who is ill.  Still very busy working out in the garden   Nov. 29, 2016:  Doing well.  No CP or dyspnea.  Still very busy taking care of his wife, Todd Armstrong  She is having lots of bleeding from her bowels.   May 13, 2016:  Todd Armstrong is doing well.  Still taking care of Todd Armstrong  - she has more bad days than good days .  Enjoys his garden  BP and HR are very good No CP or dyspnea  Nov. 3, 2017:  Doing well.   Worked in his garden this past summer .  BP is well controlled.  HR is slow,  But no syncopal episodes  Having more challenges with wife , Todd Armstrong now.   She seems to have more dementia .  July 18. 2018  No issues, no CP .  BP and HR are well controlled.   January 18, 2018:  Doing well.   Stays busy.    He has moderate to severe right carotid artery stenosis.   Recommendation is for a follow-up in 12 months which will be November, 2019.  Sept. 4, 2019:  Tough time dealing with wife Todd Armstrong,   S/p recent right carotid endarectomy Todd Salon, MD )  No CP Has occasional dizziness   February 19, 2019:  Doing well .    No CP , no dyspnea.  Still worried about wife Todd Armstrong ,  She has a UTI  BP is a bit elevated.  Avoids salty foods  Wt was 154 lbs in Sept, 2019,   [place] today is 148.8 lbs.   October 17, 2019: Todd Armstrong is seen today for a follow-up visit.  He has a history of coronary artery disease and is status post PTCA and stenting.  He also has carotid artery disease, hypertension, dyslipidemia and diabetes mellitus. Has rare episodes of orthostasis  These episodes are not so severe that he wants to consider decreasing the dose of his lisinopril HCTZ.[name]  He denies any chest pain or shortness  of breath. Wife passed away since I last saw him   Apr 23, 2020:  Todd Armstrong is in atrial fib today .  He feels fine.  Cannot tell that his HR is irreg.  No cp ,  Slight DOE with exertion compared to last year he has been able to do all of his normal activities.  He has noticed a little bit more shortness of breath when walking out the yard.   June 9 , 2021:  Todd Armstrong is seen today for follow-up of his atrial fibrillation.  He was found to have atrial fibrillation about a month ago. Has stopped driving .  Slow reflexes.  He is still in AF We discussed cardioversion     Sept. 14, 2021:  Todd Armstrong is seen today for follow up of his afib and CAD We performed a cardioversion in June, 2021 No cp, no dyspnea Is cramping in his hands and left leg  Get DOE with any activity  No angina  Still eats some processed meats. ( hot dogs, sausage)  Echocardiogram from June, 2021 reveals normal left ventricular systolic function.  We were not able to determine his diastolic parameters.  He has moderate mitral regurgitation.  Mild aortic insufficiency.  Mild tricuspid  insufficiency.  Gets up every 2-3 hours to urinate at night  February 27, 2021: Todd Armstrong is feeling better after getting over a case of pneumonia.  He did not require hospitalization but required numerous other medications. Saw Dr. Laurann Armstrong,  Was treated with Abx.  Feeling better  Tested for Covid on Monday - was negative  maxzide was changed to lasix for acute CHF related to his pneumonia Is much better now   June 03, 2021: Todd Armstrong is seen for follow up of his CAD, HYTN , pulmonary htn Atrial fib No CP,  just very short of breath with any walking .  Follows with pulmonary  Echo shows EF 50-55%. Moderate pulmonary HTN  February 15, 2022 Todd Armstrong is seen for follow up of his CAD, HTN, pulmonary HTN, atrial fib Has moderate pulmonary HTN Has completed PT/ rehab at [facility]  No CP, no dyspnea  No passing out , no dizziness.    February 23, 2023 Seen with daughter, Todd Armstrong is seen for follow up of his CAD, CHF, atrial fib, pulmonary HTN No cp or dyspnea Still getting out   He is cleared to be getting more and more frail.  He is progressively weaker.  Has had dental work , speech is slower and slightly slurred .  Denied any stroke symptoms  Still very alert , remembers our conversations from years past ( asked if I had started my garden yet )    Went to the ER last week for dizziness  Is eating so-so  Has lost weight  Is now on low dose Eliquis   Complains of cold hands , cold feet   Has orthostatsis.    Will reduce Torsemide to 10 mg a day  If his orthostasis continues, will reduce torsemide even further to 2-3 days a week   Past Medical History:  Diagnosis Date   Acute myocardial infarction    Anemia    Aortic insufficiency    Carotid stenosis    Chronic combined systolic and diastolic CHF (congestive heart failure) (HCC)    Coronary artery disease    PTCA and stenting of his right coronary artery. 3.5 x 16-mm Liberte stent.  Diabetes mellitus without  complication ([place])    Dyslipidemia    Emphysema (subcutaneous) (surgical) resulting from a procedure    Hyperlipemia    Hypertension    Mitral regurgitation    Partial tear of rotator cuff    right shoulder   Peripheral vascular disease (HCC)    Persistent atrial fibrillation ([place])    Pulmonary hypertension ([place])    Stroke [facility])    Wears dentures     Past Surgical History:  Procedure Laterality Date   CARDIAC CATHETERIZATION  11/05/2008   CARDIAC CATHETERIZATION  10/22/1993   EF 60%   CARDIOVERSION N/A 06/05/2020   Procedure: CARDIOVERSION;  Surgeon: Thayer Headings, MD;  Location: [facility] ENDOSCOPY;  Service: Cardiovascular;  Laterality: N/A;   CAROTID ENDARTERECTOMY Right 03/23/2018   CORONARY ANGIOPLASTY WITH STENT PLACEMENT     ENDARTERECTOMY Right 03/23/2018   Procedure: RIGHT CAROTID ARTERY ENDARTERECTOMY;  Surgeon: Waynetta Sandy, MD;  Location: [place];  Service: Vascular;  Laterality: Right;   FRACTURE SURGERY     righ fibula and tibia   HERNIA REPAIR  1984   KNEE ARTHROSCOPY Left    MULTIPLE TOOTH EXTRACTIONS     PATCH ANGIOPLASTY Right 03/23/2018   Procedure: WITH Rueben Bash 1CM X 6CM PATCH ANGIOPLASTY;  Surgeon: Waynetta Sandy, MD;  Location: [place];  Service: Vascular;  Laterality: Right;   SHOULDER ARTHROSCOPY Right 06/09/2017   Procedure: ARTHROSCOPY SHOULDER SUBACROMIAL DECOMPRESSION AND ACROMIOPLASTY;  Surgeon: Melrose Nakayama, MD;  Location: [place];  Service: Orthopedics;  Laterality: Right;  GENERAL ANESTHESIA WITH BLOCK     Current Outpatient Medications  Medication Sig Dispense Refill   Accu-Chek FastClix Lancets MISC For use when checking blood sugars     albuterol (VENTOLIN HFA) 108 (90 Base) MCG/ACT inhaler Inhale 2 puffs into the lungs every 6 (six) hours as needed for wheezing or shortness of breath. 8 g 6   apixaban (ELIQUIS) 2.5 MG TABS tablet Take 2.5 mg by mouth 2 (two) times daily. Per patient's daughter Dr. Koleen Nimrod switched Apixaban  (Eliquis) from 5 mg twice a day. Patient is now taking Apixaban (Eliquis)  2.5 mg twice a day.     clopidogrel (PLAVIX) 75 MG tablet TAKE 1 TABLET BY MOUTH DAILY 90 tablet 2   Dextromethorphan HBr 15 MG/5ML LIQD Take 5 mLs (15 mg total) by mouth 3 (three) times daily as needed. 118 mL 0   metFORMIN (GLUCOPHAGE) 1000 MG tablet Take 1,000 mg by mouth 2 (two) times daily with a meal.     metoprolol tartrate (LOPRESSOR) 25 MG tablet Take 0.5 tablets (12.5 mg total) by mouth 2 (two) times daily. 30 tablet 0   potassium chloride (KLOR-CON) 10 MEQ tablet Take 1 tablet (10 mEq total) by mouth 2 (two) times daily. 60 tablet 0   rosuvastatin (CRESTOR) 10 MG tablet Take 1 tablet (10 mg total) by mouth daily. Please keep scheduled appointment for future refills. Thank you. 90 tablet 0   tamsulosin (FLOMAX) 0.4 MG CAPS capsule Take 0.4 mg by mouth at bedtime.      torsemide (DEMADEX) 20 MG tablet Take 1 tablet (20 mg total) by mouth daily. 30 tablet 0   triamcinolone cream (KENALOG) 0.1 % Apply 1 application topically as needed (for itching).     nitroGLYCERIN (NITROSTAT) 0.4 MG SL tablet Place 1 tablet (0.4 mg total) under the tongue every 5 (five) minutes as needed for chest pain. 25 tablet 6   No current facility-administered medications for this visit.  Allergies:   Patient has no known allergies.    Social History:  The patient  reports that he quit smoking about 54 years ago. His smoking use included cigarettes. He has a 8.00 pack-year smoking history. He has never used smokeless tobacco. He reports that he does not drink alcohol and does not use drugs.   Family History:  The patient's family history includes Heart attack in his father.    ROS: As per current history.  All other systems are negative.  Physical Exam: Blood pressure 106/60, pulse 72, height '5\' 10"'$  (1.778 m), weight 130 lb 12.8 oz (59.3 kg), SpO2 93 %.       GEN:  thin, frail appearing male in no acute distress HEENT:  Normal NECK: No JVD; No carotid bruits LYMPHATICS: No lymphadenopathy CARDIAC: irreg. Irreg.  RESPIRATORY:  Clear to auscultation without rales, wheezing or rhonchi  ABDOMEN: Soft, non-tender, non-distended MUSCULOSKELETAL:  No edema; No deformity  SKIN: Reduced skin turgor consistent with volume depletion. NEUROLOGIC:  Alert and oriented x 3, speech is slow ( I asked if he had a recent stroke)     EKG:        Recent Labs: 02/17/2023: ALT 19; BUN 34; Creatinine, Ser 1.08; Hemoglobin 10.8; Magnesium 1.9; Platelets 155; Potassium 3.9; Sodium 140    Lipid Panel    Component Value Date/Time   CHOL 137 09/09/2022 1133   TRIG 83 09/09/2022 1133   HDL 88 09/09/2022 1133   CHOLHDL 1.6 09/09/2022 1133   CHOLHDL 2.3 10/15/2016 1215   VLDL 34 (H) 10/15/2016 1215   LDLCALC 33 09/09/2022 1133      Wt Readings from Last 3 Encounters:  02/23/23 130 lb 12.8 oz (59.3 kg)  02/17/23 134 lb (60.8 kg)  09/09/22 134 lb (60.8 kg)      Other studies Reviewed: Additional studies/ records that were reviewed today include: . Review of the above records demonstrates:    ASSESSMENT AND PLAN:  1.  Atrial fib:     stable    2.  Carotid artery stenosis:     3.  Acute congestive heart failure related to his pneumonia:  He is on Demadex 20 mg a day.  He appears to be volume depleted today.  Will reduce the Demadex to 10 mg on Mondays, Wednesdays, Fridays. If he remains unsteady on his feet we may need to discontinue the Demadex completely.      4.   HTN -        3. Diabetes Mellitus  4. Hyperlipidemia -  will check lipids , ALT, BMP to be drawn shortly before his 6 month visit with Mic    5. CAD, s/p LAD stent 2009 -         6.  Pulmonary hypertension:         Current medicines are reviewed at length with the patient today.  The patient does not have concerns regarding medicines.  The following changes have been made:  no change  Labs/ tests ordered today include:    No orders of the defined types were placed in this encounter.   Disposition:       [name], MD  02/23/2023 11:31 AM    [address], [place], [place]  [address] Phone: [phone]; Fax: [phone]

## 2023-02-23 ENCOUNTER — Encounter: Payer: Self-pay | Admitting: Cardiovascular Disease

## 2023-02-23 ENCOUNTER — Ambulatory Visit: Payer: Medicare Other | Attending: Cardiovascular Disease | Admitting: Cardiovascular Disease

## 2023-02-23 VITALS — BP 106/60 | HR 72 | Ht 70.0 in | Wt 130.8 lb

## 2023-02-23 DIAGNOSIS — I2510 Atherosclerotic heart disease of native coronary artery without angina pectoris: Secondary | ICD-10-CM

## 2023-02-23 DIAGNOSIS — I4819 Other persistent atrial fibrillation: Secondary | ICD-10-CM

## 2023-02-23 DIAGNOSIS — Z79899 Other long term (current) drug therapy: Secondary | ICD-10-CM | POA: Diagnosis not present

## 2023-02-23 MED ORDER — NITROGLYCERIN 0.4 MG SL SUBL: 0.4000 mg | SUBLINGUAL_TABLET | SUBLINGUAL | 6 refills | Status: DC | PRN

## 2023-02-23 MED ORDER — NITROGLYCERIN 0.4 MG SL SUBL: 0.4000 mg | SUBLINGUAL_TABLET | SUBLINGUAL | 6 refills | Status: AC | PRN

## 2023-02-23 MED ORDER — TORSEMIDE 20 MG PO TABS: ORAL_TABLET | ORAL | 6 refills | Status: AC

## 2023-02-23 NOTE — Patient Instructions (Signed)
Medication Instructions:  DECREASE Torsemide to '10mg'$  only on Mondays, Wednesdays, and Fridays *If you need a refill on your cardiac medications before your next appointment, please call your pharmacy*   Lab Work: BMET in 3 weeks If you have labs (blood work) drawn today and your tests are completely normal, you will receive your results only by: [place] (if you have MyChart) OR A paper copy in the mail If you have any lab test that is abnormal or we need to change your treatment, we will call you to review the results.   Testing/Procedures: NONE   Follow-Up: At [facility], you and your health needs are our priority.  As part of our continuing mission to provide you with exceptional heart care, we have created designated Provider Care Teams.  These Care Teams include your primary Cardiologist (physician) and Advanced Practice Providers (APPs -  Physician Assistants and Nurse Practitioners) who all work together to provide you with the care you need, when you need it.  We recommend signing up for the patient portal called "MyChart".  Sign up information is provided on this After Visit Summary.  MyChart is used to connect with patients for Virtual Visits (Telemedicine).  Patients are able to view lab/test results, encounter notes, upcoming appointments, etc.  Non-urgent messages can be sent to your provider as well.   To learn more about what you can do with MyChart, go to [email or web address].    Your next appointment:   3 month(s)  Provider:   [name], MD

## 2023-02-24 DIAGNOSIS — I951 Orthostatic hypotension: Secondary | ICD-10-CM | POA: Diagnosis not present

## 2023-03-04 ENCOUNTER — Other Ambulatory Visit: Payer: Self-pay | Admitting: Cardiovascular Disease

## 2023-03-14 DIAGNOSIS — L821 Other seborrheic keratosis: Secondary | ICD-10-CM | POA: Diagnosis not present

## 2023-03-14 DIAGNOSIS — L812 Freckles: Secondary | ICD-10-CM | POA: Diagnosis not present

## 2023-03-14 DIAGNOSIS — Z85828 Personal history of other malignant neoplasm of skin: Secondary | ICD-10-CM | POA: Diagnosis not present

## 2023-03-14 DIAGNOSIS — D0471 Carcinoma in situ of skin of right lower limb, including hip: Secondary | ICD-10-CM | POA: Diagnosis not present

## 2023-03-14 DIAGNOSIS — D485 Neoplasm of uncertain behavior of skin: Secondary | ICD-10-CM | POA: Diagnosis not present

## 2023-03-14 DIAGNOSIS — L853 Xerosis cutis: Secondary | ICD-10-CM | POA: Diagnosis not present

## 2023-03-14 DIAGNOSIS — L570 Actinic keratosis: Secondary | ICD-10-CM | POA: Diagnosis not present

## 2023-03-16 ENCOUNTER — Ambulatory Visit: Payer: Medicare Other | Attending: Cardiovascular Disease

## 2023-03-16 DIAGNOSIS — I2510 Atherosclerotic heart disease of native coronary artery without angina pectoris: Secondary | ICD-10-CM | POA: Diagnosis not present

## 2023-03-16 DIAGNOSIS — I4819 Other persistent atrial fibrillation: Secondary | ICD-10-CM | POA: Diagnosis not present

## 2023-03-16 DIAGNOSIS — Z79899 Other long term (current) drug therapy: Secondary | ICD-10-CM

## 2023-03-17 LAB — BASIC METABOLIC PANEL
BUN/Creatinine Ratio: 20 (ref 10–24)
BUN: 23 mg/dL (ref 10–36)
CO2: 23 mmol/L (ref 20–29)
Calcium: 9.8 mg/dL (ref 8.6–10.2)
Chloride: 104 mmol/L (ref 96–106)
Creatinine, Ser: 1.16 mg/dL (ref 0.76–1.27)
Glucose: 113 mg/dL — ABNORMAL HIGH (ref 70–99)
Potassium: 4.7 mmol/L (ref 3.5–5.2)
Sodium: 142 mmol/L (ref 134–144)
eGFR: 60 mL/min/{1.73_m2} (ref >59)

## 2023-03-18 DIAGNOSIS — I5042 Chronic combined systolic (congestive) and diastolic (congestive) heart failure: Secondary | ICD-10-CM | POA: Diagnosis not present

## 2023-03-18 DIAGNOSIS — J441 Chronic obstructive pulmonary disease with (acute) exacerbation: Secondary | ICD-10-CM | POA: Diagnosis not present

## 2023-04-13 ENCOUNTER — Other Ambulatory Visit: Payer: Self-pay | Admitting: Internal Medicine

## 2023-04-13 ENCOUNTER — Ambulatory Visit
Admission: RE | Admit: 2023-04-13 | Discharge: 2023-04-13 | Disposition: A | Discharge status: Home / Self Care | Payer: Medicare Other | Source: Ambulatory Visit | Attending: Internal Medicine | Admitting: Internal Medicine

## 2023-04-13 DIAGNOSIS — J40 Bronchitis, not specified as acute or chronic: Secondary | ICD-10-CM | POA: Diagnosis not present

## 2023-04-13 DIAGNOSIS — R059 Cough, unspecified: Secondary | ICD-10-CM | POA: Diagnosis not present

## 2023-04-13 DIAGNOSIS — J439 Emphysema, unspecified: Secondary | ICD-10-CM | POA: Diagnosis not present

## 2023-04-13 DIAGNOSIS — I5042 Chronic combined systolic (congestive) and diastolic (congestive) heart failure: Secondary | ICD-10-CM | POA: Diagnosis not present

## 2023-04-13 DIAGNOSIS — J410 Simple chronic bronchitis: Secondary | ICD-10-CM

## 2023-04-13 DIAGNOSIS — I482 Chronic atrial fibrillation, unspecified: Secondary | ICD-10-CM | POA: Diagnosis not present

## 2023-04-27 DIAGNOSIS — E1151 Type 2 diabetes mellitus with diabetic peripheral angiopathy without gangrene: Secondary | ICD-10-CM | POA: Diagnosis not present

## 2023-04-27 DIAGNOSIS — J439 Emphysema, unspecified: Secondary | ICD-10-CM | POA: Diagnosis not present

## 2023-04-27 DIAGNOSIS — R29898 Other symptoms and signs involving the musculoskeletal system: Secondary | ICD-10-CM | POA: Diagnosis not present

## 2023-04-27 DIAGNOSIS — R1310 Dysphagia, unspecified: Secondary | ICD-10-CM | POA: Diagnosis not present

## 2023-04-27 DIAGNOSIS — I739 Peripheral vascular disease, unspecified: Secondary | ICD-10-CM | POA: Diagnosis not present

## 2023-04-27 DIAGNOSIS — B351 Tinea unguium: Secondary | ICD-10-CM | POA: Diagnosis not present

## 2023-05-17 DIAGNOSIS — R1310 Dysphagia, unspecified: Secondary | ICD-10-CM | POA: Diagnosis not present

## 2023-05-17 DIAGNOSIS — R600 Localized edema: Secondary | ICD-10-CM | POA: Diagnosis not present

## 2023-05-17 DIAGNOSIS — J305 Allergic rhinitis due to food: Secondary | ICD-10-CM | POA: Diagnosis not present

## 2023-05-17 DIAGNOSIS — E1136 Type 2 diabetes mellitus with diabetic cataract: Secondary | ICD-10-CM | POA: Diagnosis not present

## 2023-05-17 DIAGNOSIS — R0982 Postnasal drip: Secondary | ICD-10-CM | POA: Diagnosis not present

## 2023-05-17 DIAGNOSIS — I4891 Unspecified atrial fibrillation: Secondary | ICD-10-CM | POA: Diagnosis not present

## 2023-05-19 ENCOUNTER — Other Ambulatory Visit: Payer: Self-pay

## 2023-05-19 ENCOUNTER — Encounter: Payer: Self-pay | Admitting: Speech Pathology

## 2023-05-19 ENCOUNTER — Ambulatory Visit: Payer: Medicare Other | Attending: Internal Medicine | Admitting: Speech Pathology

## 2023-05-19 DIAGNOSIS — R4701 Aphasia: Secondary | ICD-10-CM | POA: Diagnosis not present

## 2023-05-19 DIAGNOSIS — R1310 Dysphagia, unspecified: Secondary | ICD-10-CM | POA: Diagnosis not present

## 2023-05-19 NOTE — Therapy (Cosign Needed)
OUTPATIENT SPEECH LANGUAGE PATHOLOGY EVALUATION   Patient Name: Todd Armstrong MRN: [id number] DOB:May 21, 1931, 89 y.o., male Today's Date: 05/19/2023  PCP: Emilio Aspen, MD REFERRING PROVIDER: Georgann Housekeeper, MD  END OF SESSION:  End of Session - 05/19/23 1321     Visit Number 1    Number of Visits 25    Date for SLP Re-Evaluation 08/11/23    Progress Note Due on Visit 10    SLP Start Time 1230    SLP Stop Time  1315    SLP Time Calculation (min) 45 min    Activity Tolerance Patient tolerated treatment well             Past Medical History:  Diagnosis Date   Acute myocardial infarction    Anemia    Aortic insufficiency    Carotid stenosis    Chronic combined systolic and diastolic CHF (congestive heart failure) (HCC)    Coronary artery disease    PTCA and stenting of his right coronary artery. 3.5 x 16-mm Liberte stent.               Diabetes mellitus without complication (HCC)    Dyslipidemia    Emphysema (subcutaneous) (surgical) resulting from a procedure    Hyperlipemia    Hypertension    Mitral regurgitation    Partial tear of rotator cuff    right shoulder   Peripheral vascular disease (HCC)    Persistent atrial fibrillation (HCC)    Pulmonary hypertension (HCC)    Stroke [facility])    Wears dentures    Past Surgical History:  Procedure Laterality Date   CARDIAC CATHETERIZATION  11/05/2008   CARDIAC CATHETERIZATION  10/22/1993   EF 60%   CARDIOVERSION N/A 06/05/2020   Procedure: CARDIOVERSION;  Surgeon: Vesta Mixer, MD;  Location[facility] ENDOSCOPY;  Service: Cardiovascular;  Laterality: N/A;   CAROTID ENDARTERECTOMY Right 03/23/2018   CORONARY ANGIOPLASTY WITH STENT PLACEMENT     ENDARTERECTOMY Right 03/23/2018   Procedure: RIGHT CAROTID ARTERY ENDARTERECTOMY;  Surgeon: Maeola Harman, MD;  [facility] OR;  Service: Vascular;  Laterality: Right;   FRACTURE SURGERY     righ fibula and tibia   HERNIA REPAIR  1984   KNEE ARTHROSCOPY  Left    MULTIPLE TOOTH EXTRACTIONS     PATCH ANGIOPLASTY Right 03/23/2018   Procedure: WITH Livia Snellen 1CM X 6CM PATCH ANGIOPLASTY;  Surgeon: Maeola Harman, MD;  Location: [facility] OR;  Service: Vascular;  Laterality: Right;   SHOULDER ARTHROSCOPY Right 06/09/2017   Procedure: ARTHROSCOPY SHOULDER SUBACROMIAL DECOMPRESSION AND ACROMIOPLASTY;  Surgeon: Marcene Corning, MD;  Location: [facility] OR;  Service: Orthopedics;  Laterality: Right;  GENERAL ANESTHESIA WITH BLOCK   Patient Active Problem List   Diagnosis Date Noted   Atrial fibrillation, persistent (HCC) 02/17/2023   CKD (chronic kidney disease) stage 3, GFR 30-59 ml/min (HCC) 02/17/2023   CHF exacerbation (HCC) 09/27/2021   Emphysema/COPD (HCC) 09/27/2021   Type 2 diabetes mellitus with hyperlipidemia (HCC) 09/27/2021   Pulmonary HTN (HCC) 06/03/2021   Persistent atrial fibrillation (HCC)    Carotid stenosis, asymptomatic, right 03/23/2018   Bilateral carotid bruits 10/15/2016   Acute myocardial infarction    Dyslipidemia    Coronary artery disease    Hypertension    Hyperlipemia     ONSET DATE: 04/27/23   REFERRING DIAG:  R13.10 (ICD-10-CM) - Dysphagia, unspecified    THERAPY DIAG:  Aphasia  Dysphagia, unspecified type  Rationale for Evaluation and Treatment: Rehabilitation  SUBJECTIVE:  SUBJECTIVE STATEMENT: "Speech"-what brings you here.  Pt accompanied by: family member  PERTINENT HISTORY:   Patient complains of Dysphagia patient is awaiting the speech evaluation scheduled according to the daughter next week. He had had pneumonia probably aspiration pneumonia he is getting frail and weakness probably having swallowing muscle weakness.  Difficulty in coughing even with liquids and solid as per the daughter.          His other issue is constant nasal drip and drainage specially worse with eating more rhinitis that also is making things worse.   PAIN:  Are you having pain? No  FALLS: Has patient fallen in last 6  months?  Yes  LIVING ENVIRONMENT: Lives with: lives with their family and lives with their daughter Lives in: House/apartment  PLOF:  Level of assistance: Independent with ADLs, Independent with IADLs Employment: Retired  PATIENT GOALS: "get Engineer, manufacturing systems.   OBJECTIVE:   DIAGNOSTIC FINDINGS: Procedure: After adequate topical anesthetic was applied, 2 mm flexible laryngoscope was passed through the nasal cavity without difficulty. Flexible laryngoscopy shows patent anterior nasal cavity with minimal crusting, no discharge or infection.  Normal base of tongue and supraglottis Normal vocal cord mobility with slight bowing of true vocal folds, slight suggestion of bilateral nodule formation at the posterior one third of each cord. The hypopharynx normal without mass, pooling of secretions or aspiration.  Patient tolerated procedure without complication or difficulty.  Kermit Balo. [place]hour, PA-C  Impression & Plans:   1) hoarseness 2) presbylaryngis   COGNITION: Overall cognitive status: Within functional limits for tasks assessed Functional deficits: pt and daughter endorse no cognitive deficits   ORAL MOTOR EXAMINATION: Overall status: WFL Comments: Horse, breathy, and weak voice. Patient demonstrated with slight left side deviation observed during jaw opening.   CLINICAL SWALLOW ASSESSMENT:   Current diet: regular Dentition: Dentures top/bottom Patient directly observed with POs: Yes: thin liquids  Feeding: able to feed self Liquids provided by: cup Oral phase signs and symptoms:  No signs Pharyngeal phase signs and symptoms: immediate cough, perceptually appears weak; reddening face; shortness of breath  Patient report: ongoing coughing with thin liquids with usual coughing and sensation of liquid "going the wrong way." Endorses shortness of breath, denies odynophagia. Occasional cough with solids, sensation of "sticking" though not as frequently occurring as  coughing with liquid. Recent hx of PNA. Began ~6 months ago, co-occurring with acute challenges with word finding which appears c/w aphasia.   AUDITORY COMPREHENSION: Overall auditory comprehension: Appears intact YES/NO questions: Appears intact Following directions: Appears intact Conversation: Moderately Complex  READING COMPREHENSION: Intact  EXPRESSION: verbal  VERBAL EXPRESSION: Level of generative/spontaneous verbalization: conversation, though reduced MLU  Automatic speech: counting: intact  Repetition: Impaired: Word Naming: Confrontation:  54% Pragmatics: Appears intact Discourse: reduced MLU, reduced rate, mixed paraphasias (primarily phonemic), agrammatism, anomia; no evidence of apraxia Effective technique: none trialed d/t time constraints  Non-verbal means of communication: none evidenced  STANDARDIZED ASSESSMENTS: QAB: Moderate (6.04)   PATIENT REPORTED OUTCOME MEASURES (PROM): EAT-10: 36/40    TODAY'S TREATMENT:  DATE:   05/19/23: Evaluation completed. MBSS explained and all questions answered. Reviewed POC and goals with pt and daughter verbalizing agreement.   PATIENT EDUCATION: Education details: See above Person educated: Patient and Child(ren) Education method: Explanation Education comprehension: verbalized understanding   ASSESSMENT:  CLINICAL IMPRESSION: Patient is a 89 y.o. M who was seen today for dysphagia evaluation. Clinical swallow evaluation significant for s/sx of pharyngeal dysphagia with thin liquid trial. Patient presented with immediate cough after thin liquid trial with subsequent shortness of breath. MBSS recommended, with pt and daughter endorsing agreement. Patient presented with hoarse and strained voice, and stated that he often runs out of air when eating and speaking. Denied change is voice  presentation though reduced intelligibility resulted from vocal strain. QAB indicates moderate aphasia. Pt's receptive language appears grossly intact, expressive language is c/b slow, halting speech with agrammatism, anomia, and paraphasias. Discourse cohesion negatively impacted, communication partner required to A pt in expressing desired message. Pt's and daughter endorse this is new, co-occurring with swallow changes which began 6 months ago without clear etiology. Chart review w/o brain imaging so unable to determine if organic etiology may be impacting these changes. PCP may wish to consider brain imaging to further evaluate the cause of these new challenges. Swallowing challenges could potentially be related to hx of voice changes, endorsed by ENT visit 05/2022, however does not explain new challenges with word finding. Pt would benefit from ST to address dysphagia and aphasia to maximize independence, increase safety during the swallow, and improve communication efficacy.    OBJECTIVE IMPAIRMENTS: include voice disorder and dysphagia. These impairments are limiting patient from safety when swallowing. Factors affecting potential to achieve goals and functional outcome are previous level of function. Patient will benefit from skilled SLP services to address above impairments and improve overall function.  REHAB POTENTIAL: Good   GOALS: Goals reviewed with patient? Yes  SHORT TERM GOALS: Target date: 06/16/2023  Pt will participate in MBSS Baseline: Goal status: INITIAL  2.  Pt will teach back and demonstrate use of swallow strategies and targeted swallow exercises with 100% accuracy, with use of visual aid PRN Baseline:  Goal status: INITIAL  3.  Pt will demonstrate completion of x4 potential aphasia HEP activities with mod-A from SLP  Baseline:  Goal status: INITIAL  4.  Pt will report HEP for dysphagia/aphasia completion 5/7 days over 1 week period Baseline:  Goal status:  INITIAL   LONG TERM GOALS: Target date: 08/11/2023  Pt will report successful completion of HEP 5/7 days over 2 week period with support from daughter Baseline:  Goal status: INITIAL  2.  Pt will complete structured language tasks with 80% accuracy given occasional mod-A Baseline:  Goal status: INITIAL  3.  Pt will report improvement via EAT-10 by dc Baseline:  Goal status: INITIAL  4.  Pt will complete f/u MBSS, if indicated  Baseline:  Goal status: INITIAL  PLAN:  SLP FREQUENCY: 2x/week  SLP DURATION: 12 weeks  PLANNED INTERVENTIONS: Aspiration precaution training, Pharyngeal strengthening exercises, Diet toleration management , Language facilitation, Trials of upgraded texture/liquids, Cueing hierachy, Functional tasks, Multimodal communication approach, SLP instruction and feedback, Compensatory strategies, Patient/family education, and Re-evaluation  Maia Breslow, CCC-SLP 05/19/2023, 2:11 PM

## 2023-05-21 ENCOUNTER — Other Ambulatory Visit (HOSPITAL_COMMUNITY): Payer: Self-pay | Admitting: *Deleted

## 2023-05-21 DIAGNOSIS — R1310 Dysphagia, unspecified: Secondary | ICD-10-CM

## 2023-05-24 ENCOUNTER — Ambulatory Visit: Payer: Medicare Other | Admitting: Speech Pathology

## 2023-05-24 DIAGNOSIS — R1310 Dysphagia, unspecified: Secondary | ICD-10-CM | POA: Diagnosis not present

## 2023-05-24 DIAGNOSIS — R4701 Aphasia: Secondary | ICD-10-CM

## 2023-05-24 NOTE — Therapy (Signed)
OUTPATIENT SPEECH LANGUAGE PATHOLOGY TREATMENT   Patient Name: Todd Armstrong MRN: [id number] DOB:May 12, 1931, 89 y.o., male Today's Date: 05/24/2023  PCP: Emilio Aspen, MD REFERRING PROVIDER: Georgann Housekeeper, MD  END OF SESSION:  End of Session - 05/24/23 1058     Visit Number 2    Number of Visits 25    Date for SLP Re-Evaluation 08/11/23    Progress Note Due on Visit 10    SLP Start Time 1100    SLP Stop Time  1145    SLP Time Calculation (min) 45 min    Activity Tolerance Patient tolerated treatment well              Past Medical History:  Diagnosis Date   Acute myocardial infarction    Anemia    Aortic insufficiency    Carotid stenosis    Chronic combined systolic and diastolic CHF (congestive heart failure) (HCC)    Coronary artery disease    PTCA and stenting of his right coronary artery. 3.5 x 16-mm Liberte stent.               Diabetes mellitus without complication (HCC)    Dyslipidemia    Emphysema (subcutaneous) (surgical) resulting from a procedure    Hyperlipemia    Hypertension    Mitral regurgitation    Partial tear of rotator cuff    right shoulder   Peripheral vascular disease (HCC)    Persistent atrial fibrillation (HCC)    Pulmonary hypertension (HCC)    Stroke [facility])    Wears dentures    Past Surgical History:  Procedure Laterality Date   CARDIAC CATHETERIZATION  11/05/2008   CARDIAC CATHETERIZATION  10/22/1993   EF 60%   CARDIOVERSION N/A 06/05/2020   Procedure: CARDIOVERSION;  Surgeon: Vesta Mixer, MD;  Location: [facility] ENDOSCOPY;  Service: Cardiovascular;  Laterality: N/A;   CAROTID ENDARTERECTOMY Right 03/23/2018   CORONARY ANGIOPLASTY WITH STENT PLACEMENT     ENDARTERECTOMY Right 03/23/2018   Procedure: RIGHT CAROTID ARTERY ENDARTERECTOMY;  Surgeon: Maeola Harman, MD;  Location: [facility] OR;  Service: Vascular;  Laterality: Right;   FRACTURE SURGERY     righ fibula and tibia   HERNIA REPAIR  1984   KNEE  ARTHROSCOPY Left    MULTIPLE TOOTH EXTRACTIONS     PATCH ANGIOPLASTY Right 03/23/2018   Procedure: WITH Livia Snellen 1CM X 6CM PATCH ANGIOPLASTY;  Surgeon: Maeola Harman, MD;  Location: [facility] OR;  Service: Vascular;  Laterality: Right;   SHOULDER ARTHROSCOPY Right 06/09/2017   Procedure: ARTHROSCOPY SHOULDER SUBACROMIAL DECOMPRESSION AND ACROMIOPLASTY;  Surgeon: Marcene Corning, MD;  Location: [facility] OR;  Service: Orthopedics;  Laterality: Right;  GENERAL ANESTHESIA WITH BLOCK   Patient Active Problem List   Diagnosis Date Noted   Atrial fibrillation, persistent (HCC) 02/17/2023   CKD (chronic kidney disease) stage 3, GFR 30-59 ml/min (HCC) 02/17/2023   CHF exacerbation (HCC) 09/27/2021   Emphysema/COPD (HCC) 09/27/2021   Type 2 diabetes mellitus with hyperlipidemia (HCC) 09/27/2021   Pulmonary HTN (HCC) 06/03/2021   Persistent atrial fibrillation (HCC)    Carotid stenosis, asymptomatic, right 03/23/2018   Bilateral carotid bruits 10/15/2016   Acute myocardial infarction    Dyslipidemia    Coronary artery disease    Hypertension    Hyperlipemia     ONSET DATE: 04/27/23   REFERRING DIAG:  R13.10 (ICD-10-CM) - Dysphagia, unspecified    THERAPY DIAG:  Dysphagia, unspecified type  Aphasia  Rationale for Evaluation and Treatment: Rehabilitation  SUBJECTIVE:  SUBJECTIVE STATEMENT: "Just okay"  Pt accompanied by: family member  PERTINENT HISTORY:   Patient complains of Dysphagia patient is awaiting the speech evaluation scheduled according to the daughter next week. He had had pneumonia probably aspiration pneumonia he is getting frail and weakness probably having swallowing muscle weakness.  Difficulty in coughing even with liquids and solid as per the daughter.          His other issue is constant nasal drip and drainage specially worse with eating more rhinitis that also is making things worse.   PAIN:  Are you having pain? No  FALLS: Has patient fallen in last 6 months?   Yes  LIVING ENVIRONMENT: Lives with: lives with their family and lives with their daughter Lives in: House/apartment  PLOF:  Level of assistance: Independent with ADLs, Independent with IADLs Employment: Retired  PATIENT GOALS: "get Engineer, manufacturing systems.   OBJECTIVE:   DIAGNOSTIC FINDINGS: Procedure: After adequate topical anesthetic was applied, 2 mm flexible laryngoscope was passed through the nasal cavity without difficulty. Flexible laryngoscopy shows patent anterior nasal cavity with minimal crusting, no discharge or infection.  Normal base of tongue and supraglottis Normal vocal cord mobility with slight bowing of true vocal folds, slight suggestion of bilateral nodule formation at the posterior one third of each cord. The hypopharynx normal without mass, pooling of secretions or aspiration.  Patient tolerated procedure without complication or difficulty.  Kermit Balo. [place]hour, PA-C  Impression & Plans:   1) hoarseness 2) presbylaryngis   COGNITION: Overall cognitive status: Within functional limits for tasks assessed Functional deficits: pt and daughter endorse no cognitive deficits   ORAL MOTOR EXAMINATION: Overall status: WFL Comments: Horse, breathy, and weak voice. Patient demonstrated with slight left side deviation observed during jaw opening.   CLINICAL SWALLOW ASSESSMENT:   Current diet: regular Dentition: Dentures top/bottom Patient directly observed with POs: Yes: thin liquids  Feeding: able to feed self Liquids provided by: cup Oral phase signs and symptoms:  No signs Pharyngeal phase signs and symptoms: immediate cough, perceptually appears weak; reddening face; shortness of breath  Patient report: ongoing coughing with thin liquids with usual coughing and sensation of liquid "going the wrong way." Endorses shortness of breath, denies odynophagia. Occasional cough with solids, sensation of "sticking" though not as frequently occurring as coughing with  liquid. Recent hx of PNA. Began ~6 months ago, co-occurring with acute challenges with word finding which appears c/w aphasia.   AUDITORY COMPREHENSION: Overall auditory comprehension: Appears intact YES/NO questions: Appears intact Following directions: Appears intact Conversation: Moderately Complex  READING COMPREHENSION: Intact  EXPRESSION: verbal  VERBAL EXPRESSION: Level of generative/spontaneous verbalization: conversation, though reduced MLU  Automatic speech: counting: intact  Repetition: Impaired: Word Naming: Confrontation:  54% Pragmatics: Appears intact Discourse: reduced MLU, reduced rate, mixed paraphasias (primarily phonemic), agrammatism, anomia; no evidence of apraxia Effective technique: none trialed d/t time constraints  Non-verbal means of communication: none evidenced  STANDARDIZED ASSESSMENTS: QAB: Moderate (6.04)   PATIENT REPORTED OUTCOME MEASURES (PROM): EAT-10: 36/40    TODAY'S TREATMENT:  DATE:   05/24/23: Targeted word finding, verbal apraxia, sentence generation with Scientist, product/process development (VNeST). For the verbs watch, play, cook, read, the patient generated 3 subject and objects for each verb, a total of three subjects and objects with mod-A verbal, questioning, written cues required. Pt generated complex sentences, 1 for each verb, by answering "wh" questions with mod-A cues, for a total of 12 complex sentences. Daughter was able to aid in some clarifications during times of poor intelligibility due to increase in background knowledge.     05/19/23: Evaluation completed. MBSS explained and all questions answered. Reviewed POC and goals with pt and daughter verbalizing agreement.   PATIENT EDUCATION: Education details: See above Person educated: Patient and Child(ren) Education method:  Explanation Education comprehension: verbalized understanding   ASSESSMENT:  CLINICAL IMPRESSION: Patient is a 89 y.o. M who was seen today for dysphagia evaluation. Clinical swallow evaluation significant for s/sx of pharyngeal dysphagia with thin liquid trial. Patient presented with immediate cough after thin liquid trial with subsequent shortness of breath. MBSS recommended, with pt and daughter endorsing agreement. Patient presented with hoarse and strained voice, and stated that he often runs out of air when eating and speaking. Denied change is voice presentation though reduced intelligibility resulted from vocal strain. QAB indicates moderate aphasia. Pt's receptive language appears grossly intact, expressive language is c/b slow, halting speech with agrammatism, anomia, and paraphasias. Discourse cohesion negatively impacted, communication partner required to A pt in expressing desired message. Pt's and daughter endorse this is new, co-occurring with swallow changes which began 6 months ago without clear etiology. Chart review w/o brain imaging so unable to determine if organic etiology may be impacting these changes. PCP may wish to consider brain imaging to further evaluate the cause of these new challenges. Swallowing challenges could potentially be related to hx of voice changes, endorsed by ENT visit 05/2022, however does not explain new challenges with word finding. Pt would benefit from ST to address dysphagia and aphasia to maximize independence, increase safety during the swallow, and improve communication efficacy.    OBJECTIVE IMPAIRMENTS: include voice disorder and dysphagia. These impairments are limiting patient from safety when swallowing. Factors affecting potential to achieve goals and functional outcome are previous level of function. Patient will benefit from skilled SLP services to address above impairments and improve overall function.  REHAB POTENTIAL:  Good   GOALS: Goals reviewed with patient? Yes  SHORT TERM GOALS: Target date: 06/16/2023  Pt will participate in MBSS Baseline: Goal status: INITIAL  2.  Pt will teach back and demonstrate use of swallow strategies and targeted swallow exercises with 100% accuracy, with use of visual aid PRN Baseline:  Goal status: INITIAL  3.  Pt will demonstrate completion of x4 potential aphasia HEP activities with mod-A from SLP  Baseline:  Goal status: INITIAL  4.  Pt will report HEP for dysphagia/aphasia completion 5/7 days over 1 week period Baseline:  Goal status: INITIAL   LONG TERM GOALS: Target date: 08/11/2023  Pt will report successful completion of HEP 5/7 days over 2 week period with support from daughter Baseline:  Goal status: INITIAL  2.  Pt will complete structured language tasks with 80% accuracy given occasional mod-A Baseline:  Goal status: INITIAL  3.  Pt will report improvement via EAT-10 by dc Baseline:  Goal status: INITIAL  4.  Pt will complete f/u MBSS, if indicated  Baseline:  Goal status: INITIAL  PLAN:  SLP FREQUENCY: 2x/week  SLP DURATION: 12  weeks  PLANNED INTERVENTIONS: Aspiration precaution training, Pharyngeal strengthening exercises, Diet toleration management , Language facilitation, Trials of upgraded texture/liquids, Cueing hierachy, Functional tasks, Multimodal communication approach, SLP instruction and feedback, Compensatory strategies, Patient/family education, and Re-evaluation  [place], Student-SLP 05/24/2023, 10:59 AM

## 2023-05-27 ENCOUNTER — Encounter (HOSPITAL_COMMUNITY): Payer: Medicare Other

## 2023-05-27 ENCOUNTER — Ambulatory Visit: Payer: Medicare Other | Admitting: Speech Pathology

## 2023-05-27 DIAGNOSIS — R4701 Aphasia: Secondary | ICD-10-CM | POA: Diagnosis not present

## 2023-05-27 DIAGNOSIS — R1310 Dysphagia, unspecified: Secondary | ICD-10-CM | POA: Diagnosis not present

## 2023-05-27 NOTE — Therapy (Signed)
OUTPATIENT SPEECH LANGUAGE PATHOLOGY TREATMENT   Patient Name: Todd Armstrong MRN: [id number] DOB:[birth year], 89 y.o., male Today's Date: 05/27/2023  PCP: Emilio Aspen, MD REFERRING PROVIDER: Georgann Housekeeper, MD  END OF SESSION:  End of Session - 05/27/23 1104     Visit Number 3    Number of Visits 25    Date for SLP Re-Evaluation 08/11/23    Progress Note Due on Visit 10    SLP Start Time 1105    SLP Stop Time  1150    SLP Time Calculation (min) 45 min    Activity Tolerance Patient tolerated treatment well               Past Medical History:  Diagnosis Date   Acute myocardial infarction    Anemia    Aortic insufficiency    Carotid stenosis    Chronic combined systolic and diastolic CHF (congestive heart failure) (HCC)    Coronary artery disease    PTCA and stenting of his right coronary artery. 3.5 x 16-mm Liberte stent.               Diabetes mellitus without complication (HCC)    Dyslipidemia    Emphysema (subcutaneous) (surgical) resulting from a procedure    Hyperlipemia    Hypertension    Mitral regurgitation    Partial tear of rotator cuff    right shoulder   Peripheral vascular disease (HCC)    Persistent atrial fibrillation (HCC)    Pulmonary hypertension (HCC)    Stroke [facility])    Wears dentures    Past Surgical History:  Procedure Laterality Date   CARDIAC CATHETERIZATION  11/05/2008   CARDIAC CATHETERIZATION  10/22/1993   EF 60%   CARDIOVERSION N/A 06/05/2020   Procedure: CARDIOVERSION;  Surgeon: Vesta Mixer, MD;  Location: [facility] ENDOSCOPY;  Service: Cardiovascular;  Laterality: N/A;   CAROTID ENDARTERECTOMY Right 03/23/2018   CORONARY ANGIOPLASTY WITH STENT PLACEMENT     ENDARTERECTOMY Right 03/23/2018   Procedure: RIGHT CAROTID ARTERY ENDARTERECTOMY;  Surgeon: Maeola Harman, MD;  Location: [facility] OR;  Service: Vascular;  Laterality: Right;   FRACTURE SURGERY     righ fibula and tibia   HERNIA REPAIR  1984   KNEE  ARTHROSCOPY Left    MULTIPLE TOOTH EXTRACTIONS     PATCH ANGIOPLASTY Right 03/23/2018   Procedure: WITH Livia Snellen 1CM X 6CM PATCH ANGIOPLASTY;  Surgeon: Maeola Harman, MD;  Location: [facility] OR;  Service: Vascular;  Laterality: Right;   SHOULDER ARTHROSCOPY Right 06/09/2017   Procedure: ARTHROSCOPY SHOULDER SUBACROMIAL DECOMPRESSION AND ACROMIOPLASTY;  Surgeon: Marcene Corning, MD;  Location: [facility] OR;  Service: Orthopedics;  Laterality: Right;  GENERAL ANESTHESIA WITH BLOCK   Patient Active Problem List   Diagnosis Date Noted   Atrial fibrillation, persistent (HCC) 02/17/2023   CKD (chronic kidney disease) stage 3, GFR 30-59 ml/min (HCC) 02/17/2023   CHF exacerbation (HCC) 09/27/2021   Emphysema/COPD (HCC) 09/27/2021   Type 2 diabetes mellitus with hyperlipidemia (HCC) 09/27/2021   Pulmonary HTN (HCC) 06/03/2021   Persistent atrial fibrillation (HCC)    Carotid stenosis, asymptomatic, right 03/23/2018   Bilateral carotid bruits 10/15/2016   Acute myocardial infarction    Dyslipidemia    Coronary artery disease    Hypertension    Hyperlipemia     ONSET DATE: 04/27/23   REFERRING DIAG:  R13.10 (ICD-10-CM) - Dysphagia, unspecified    THERAPY DIAG:  Aphasia  Rationale for Evaluation and Treatment: Rehabilitation  SUBJECTIVE:   SUBJECTIVE STATEMENT: "  Doing okay"  Pt accompanied by: family member  PERTINENT HISTORY:   Patient complains of Dysphagia patient is awaiting the speech evaluation scheduled according to the daughter next week. He had had pneumonia probably aspiration pneumonia he is getting frail and weakness probably having swallowing muscle weakness.  Difficulty in coughing even with liquids and solid as per the daughter.          His other issue is constant nasal drip and drainage specially worse with eating more rhinitis that also is making things worse.   PAIN:  Are you having pain? No  FALLS: Has patient fallen in last 6 months?  Yes  LIVING  ENVIRONMENT: Lives with: lives with their family and lives with their daughter Lives in: House/apartment  PLOF:  Level of assistance: Independent with ADLs, Independent with IADLs Employment: Retired  PATIENT GOALS: "get Engineer, manufacturing systems.   OBJECTIVE:   DIAGNOSTIC FINDINGS: Procedure: After adequate topical anesthetic was applied, 2 mm flexible laryngoscope was passed through the nasal cavity without difficulty. Flexible laryngoscopy shows patent anterior nasal cavity with minimal crusting, no discharge or infection.  Normal base of tongue and supraglottis Normal vocal cord mobility with slight bowing of true vocal folds, slight suggestion of bilateral nodule formation at the posterior one third of each cord. The hypopharynx normal without mass, pooling of secretions or aspiration.  Patient tolerated procedure without complication or difficulty.  Kermit Balo. [place], PA-C  Impression & Plans:   1) hoarseness 2) presbylaryngis   COGNITION: Overall cognitive status: Within functional limits for tasks assessed Functional deficits: pt and daughter endorse no cognitive deficits   ORAL MOTOR EXAMINATION: Overall status: WFL Comments: Horse, breathy, and weak voice. Patient demonstrated with slight left side deviation observed during jaw opening.   CLINICAL SWALLOW ASSESSMENT:   Current diet: regular Dentition: Dentures top/bottom Patient directly observed with POs: Yes: thin liquids  Feeding: able to feed self Liquids provided by: cup Oral phase signs and symptoms:  No signs Pharyngeal phase signs and symptoms: immediate cough, perceptually appears weak; reddening face; shortness of breath  Patient report: ongoing coughing with thin liquids with usual coughing and sensation of liquid "going the wrong way." Endorses shortness of breath, denies odynophagia. Occasional cough with solids, sensation of "sticking" though not as frequently occurring as coughing with liquid. Recent  hx of PNA. Began ~6 months ago, co-occurring with acute challenges with word finding which appears c/w aphasia.   AUDITORY COMPREHENSION: Overall auditory comprehension: Appears intact YES/NO questions: Appears intact Following directions: Appears intact Conversation: Moderately Complex  READING COMPREHENSION: Intact  EXPRESSION: verbal  VERBAL EXPRESSION: Level of generative/spontaneous verbalization: conversation, though reduced MLU  Automatic speech: counting: intact  Repetition: Impaired: Word Naming: Confrontation:  54% Pragmatics: Appears intact Discourse: reduced MLU, reduced rate, mixed paraphasias (primarily phonemic), agrammatism, anomia; no evidence of apraxia Effective technique: none trialed d/t time constraints  Non-verbal means of communication: none evidenced  STANDARDIZED ASSESSMENTS: QAB: Moderate (6.04)   PATIENT REPORTED OUTCOME MEASURES (PROM): EAT-10: 36/40    TODAY'S TREATMENT:  DATE:   05/27/23: Targeted word finding through SFA. Patient required frequent min-A to find words related to topic. He struggled the most with finding the category what the item reminded him of. Patient stated that he enjoyed this intervention more then VNeST, but that both were the same in difficulty. Clarify of voice remains the same throughout the session due to vocal strain. MBSS scheduled for 6/18, and SLP will review results next session. All questions answered to patient satisfaction.  05/24/23: Targeted word finding, verbal apraxia, sentence generation with Scientist, product/process development (VNeST). For the verbs watch, play, cook, read, the patient generated 3 subject and objects for each verb, a total of three subjects and objects with mod-A verbal, questioning, written cues required. Pt generated complex sentences, 1 for each verb, by  answering "wh" questions with mod-A cues, for a total of 12 complex sentences. Daughter was able to aid in some clarifications during times of poor intelligibility due to increase in background knowledge.     05/19/23: Evaluation completed. MBSS explained and all questions answered. Reviewed POC and goals with pt and daughter verbalizing agreement.   PATIENT EDUCATION: Education details: See above Person educated: Patient and Child(ren) Education method: Explanation Education comprehension: verbalized understanding   ASSESSMENT:  CLINICAL IMPRESSION: Patient is a 89 y.o. M who was seen today for dysphagia evaluation. Clinical swallow evaluation significant for s/sx of pharyngeal dysphagia with thin liquid trial. Patient presented with immediate cough after thin liquid trial with subsequent shortness of breath. MBSS recommended, with pt and daughter endorsing agreement. Patient presented with hoarse and strained voice, and stated that he often runs out of air when eating and speaking. Denied change is voice presentation though reduced intelligibility resulted from vocal strain. QAB indicates moderate aphasia. Pt's receptive language appears grossly intact, expressive language is c/b slow, halting speech with agrammatism, anomia, and paraphasias. Discourse cohesion negatively impacted, communication partner required to A pt in expressing desired message. Pt's and daughter endorse this is new, co-occurring with swallow changes which began 6 months ago without clear etiology. Chart review w/o brain imaging so unable to determine if organic etiology may be impacting these changes. PCP may wish to consider brain imaging to further evaluate the cause of these new challenges. Swallowing challenges could potentially be related to hx of voice changes, endorsed by ENT visit 05/2022, however does not explain new challenges with word finding. Pt would benefit from ST to address dysphagia and aphasia to maximize  independence, increase safety during the swallow, and improve communication efficacy.    OBJECTIVE IMPAIRMENTS: include voice disorder and dysphagia. These impairments are limiting patient from safety when swallowing. Factors affecting potential to achieve goals and functional outcome are previous level of function. Patient will benefit from skilled SLP services to address above impairments and improve overall function.  REHAB POTENTIAL: Good   GOALS: Goals reviewed with patient? Yes  SHORT TERM GOALS: Target date: 06/16/2023  Pt will participate in MBSS Baseline: Goal status: IN PROGRESS  2.  Pt will teach back and demonstrate use of swallow strategies and targeted swallow exercises with 100% accuracy, with use of visual aid PRN Baseline:  Goal status: IN PROGRESS  3.  Pt will demonstrate completion of x4 potential aphasia HEP activities with mod-A from SLP  Baseline:  Goal status: IN PROGRESS  4.  Pt will report HEP for dysphagia/aphasia completion 5/7 days over 1 week period Baseline:  Goal status: IN PROGRESS   LONG TERM GOALS: Target date: 08/11/2023  Pt  will report successful completion of HEP 5/7 days over 2 week period with support from daughter Baseline:  Goal status: IN PROGRESS  2.  Pt will complete structured language tasks with 80% accuracy given occasional mod-A Baseline:  Goal status: IN PROGRESS  3.  Pt will report improvement via EAT-10 by dc Baseline:  Goal status: IN PROGRESS  4.  Pt will complete f/u MBSS, if indicated  Baseline:  Goal status: IN PROGRESS  PLAN:  SLP FREQUENCY: 2x/week  SLP DURATION: 12 weeks  PLANNED INTERVENTIONS: Aspiration precaution training, Pharyngeal strengthening exercises, Diet toleration management , Language facilitation, Trials of upgraded texture/liquids, Cueing hierachy, Functional tasks, Multimodal communication approach, SLP instruction and feedback, Compensatory strategies, Patient/family education, and  Re-evaluation  [place], Radene Journey, CCC-SLP 05/27/2023, 2:10 PM

## 2023-05-30 ENCOUNTER — Encounter: Payer: Self-pay | Admitting: Cardiovascular Disease

## 2023-05-30 NOTE — Progress Notes (Unsigned)
Cardiology Office Note   Date:  06/01/2023   ID:  Todd Armstrong, DOB 01-Jul-1931, MRN [id number]  PCP:  Emilio Aspen, MD  Cardiologist:   Kristeen Miss, MD   Chief Complaint  Patient presents with   Coronary Artery Disease   Congestive Heart Failure   Atrial Fibrillation   1. CAD, s/p LAD stent 2009 2. HTN 3. Diabetes Mellitus 4. Hyperlipidemia 5. Carotid artery stenosis  - Right - mod- severe    Todd Armstrong is doing well from a cardiac standpoint. He has not had any episodes of chest pain or shortness breath. [name] been exercising on a regular basis.  He had a nice garden this year.    Apr 26, 2013:  Todd Armstrong is doing well.  No CP , no dyspnea.   He has put in his garden this year.    Nov. 12, 2014:  Apr 24, 2014:  Todd Armstrong is doing well.   No CP , no dyspnea.    hes getting his garden going.     Nov. 9, 2015:  Todd Armstrong is doing well.  His BP was elevated at his last visit.  We increased his Lisinopril/HCTZ and his Bp is much better.    May 15, 2015:   Todd Armstrong is a 89 y.o. male who presents for his CAD Doing well.   No CP or dyspnea.  Still caring for his wife who is ill.  Still very busy working out in the garden   Nov. 29, 2016:  Doing well.  No CP or dyspnea.  Still very busy taking care of his wife, Eber Jones  She is having lots of bleeding from her bowels.   May 13, 2016:  Todd Armstrong is doing well.  Still taking care of Eber Jones  - she has more bad days than good days .  Enjoys his garden  BP and HR are very good No CP or dyspnea  Nov. 3, 2017:  Doing well.   Worked in his garden this past summer .  BP is well controlled.  HR is slow,  But no syncopal episodes  Having more challenges with wife , Eber Jones now.   She seems to have more dementia .  July 18. 2018  No issues, no CP .  BP and HR are well controlled.   January 18, 2018:  Doing well.   Stays busy.    He has moderate to severe right carotid artery stenosis.   Recommendation is for a follow-up in 12 months which will be November, 2019.  Sept. 4, 2019:  Tough time dealing with wife Eber Jones,   S/p recent right carotid endarectomy Jaynee Eagles, MD )  No CP Has occasional dizziness   February 19, 2019:  Doing well .    No CP , no dyspnea.  Still worried about wife Eber Jones ,  She has a UTI  BP is a bit elevated.  Avoids salty foods  Wt was 154 lbs in Sept, 2019,   [place] today is 148.8 lbs.   October 17, 2019: Todd Armstrong is seen today for a follow-up visit.  He has a history of coronary artery disease and is status post PTCA and stenting.  He also has carotid artery disease, hypertension, dyslipidemia and diabetes mellitus. Has rare episodes of orthostasis  These episodes are not so severe that he wants to consider decreasing the dose of his lisinopril HCTZ.[name]  He denies any chest pain or shortness of breath. Wife passed away since I last saw him  Apr 23, 2020:  Todd Armstrong is in atrial fib today .  He feels fine.  Cannot tell that his HR is irreg.  No cp ,  Slight DOE with exertion compared to last year he has been able to do all of his normal activities.  He has noticed a little bit more shortness of breath when walking out the yard.   June 9 , 2021:  Todd Armstrong is seen today for follow-up of his atrial fibrillation.  He was found to have atrial fibrillation about a month ago. Has stopped driving .  Slow reflexes.  He is still in AF We discussed cardioversion     Sept. 14, 2021:  Todd Armstrong is seen today for follow up of his afib and CAD We performed a cardioversion in June, 2021 No cp, no dyspnea Is cramping in his hands and left leg  Get DOE with any activity  No angina  Still eats some processed meats. ( hot dogs, sausage)  Echocardiogram from June, 2021 reveals normal left ventricular systolic function.  We were not able to determine his diastolic parameters.  He has moderate mitral regurgitation.  Mild aortic insufficiency.  Mild tricuspid  insufficiency.  Gets up every 2-3 hours to urinate at night  February 27, 2021: Todd Armstrong is feeling better after getting over a case of pneumonia.  He did not require hospitalization but required numerous other medications. Saw Dr. Valentina Lucks,  Was treated with Abx.  Feeling better  Tested for Covid on Monday - was negative  maxzide was changed to lasix for acute CHF related to his pneumonia Is much better now   June 03, 2021: Todd Armstrong is seen for follow up of his CAD, HYTN , pulmonary htn Atrial fib No CP,  just very short of breath with any walking .  Follows with pulmonary  Echo shows EF 50-55%. Moderate pulmonary HTN  February 15, 2022 Todd Armstrong is seen for follow up of his CAD, HTN, pulmonary HTN, atrial fib Has moderate pulmonary HTN Has completed PT/ rehab at [facility]  No CP, no dyspnea  No passing out , no dizziness.    February 23, 2023 Seen with daughter, Todd Armstrong is seen for follow up of his CAD, CHF, atrial fib, pulmonary HTN No cp or dyspnea Still getting out   He is cleared to be getting more and more frail.  He is progressively weaker.  Has had dental work , speech is slower and slightly slurred .  Denied any stroke symptoms  Still very alert , remembers our conversations from years past ( asked if I had started my garden yet )    Went to the ER last week for dizziness  Is eating so-so  Has lost weight  Is now on low dose Eliquis   Complains of cold hands , cold feet   Has orthostatsis.    Will reduce Torsemide to 10 mg a day  If his orthostasis continues, will reduce torsemide even further to 2-3 days a week   June 01, 2023 Todd Armstrong is seen for follow up of his CAD, CHF, Afib  Speech continues to be an issue Swallow study was normal  He is still awake and alter .    Past Medical History:  Diagnosis Date   Acute myocardial infarction    Anemia    Aortic insufficiency    Carotid stenosis    Chronic combined systolic and diastolic CHF (congestive heart  failure) (HCC)    Coronary artery disease    PTCA  and stenting of his right coronary artery. 3.5 x 16-mm Liberte stent.               Diabetes mellitus without complication (HCC)    Dyslipidemia    Emphysema (subcutaneous) (surgical) resulting from a procedure    Hyperlipemia    Hypertension    Mitral regurgitation    Partial tear of rotator cuff    right shoulder   Peripheral vascular disease (HCC)    Persistent atrial fibrillation (HCC)    Pulmonary hypertension (HCC)    Stroke [facility])    Wears dentures     Past Surgical History:  Procedure Laterality Date   CARDIAC CATHETERIZATION  11/05/2008   CARDIAC CATHETERIZATION  10/22/1993   EF 60%   CARDIOVERSION N/A 06/05/2020   Procedure: CARDIOVERSION;  Surgeon: Vesta Mixer, MD;  Location: [facility] ENDOSCOPY;  Service: Cardiovascular;  Laterality: N/A;   CAROTID ENDARTERECTOMY Right 03/23/2018   CORONARY ANGIOPLASTY WITH STENT PLACEMENT     ENDARTERECTOMY Right 03/23/2018   Procedure: RIGHT CAROTID ARTERY ENDARTERECTOMY;  Surgeon: Maeola Harman, MD;  Location[facility] OR;  Service: Vascular;  Laterality: Right;   FRACTURE SURGERY     righ fibula and tibia   HERNIA REPAIR  1984   KNEE ARTHROSCOPY Left    MULTIPLE TOOTH EXTRACTIONS     PATCH ANGIOPLASTY Right 03/23/2018   Procedure: WITH Livia Snellen 1CM X 6CM PATCH ANGIOPLASTY;  Surgeon: Maeola Harman, MD;  Location[facility] OR;  Service: Vascular;  Laterality: Right;   SHOULDER ARTHROSCOPY Right 06/09/2017   Procedure: ARTHROSCOPY SHOULDER SUBACROMIAL DECOMPRESSION AND ACROMIOPLASTY;  Surgeon: Marcene Corning, MD;  Location: [facility] OR;  Service: Orthopedics;  Laterality: Right;  GENERAL ANESTHESIA WITH BLOCK     Current Outpatient Medications  Medication Sig Dispense Refill   Accu-Chek FastClix Lancets MISC For use when checking blood sugars     albuterol (VENTOLIN HFA) 108 (90 Base) MCG/ACT inhaler Inhale 2 puffs into the lungs every 6 (six) hours as needed for wheezing or  shortness of breath. 8 g 6   apixaban (ELIQUIS) 2.5 MG TABS tablet Take 2.5 mg by mouth 2 (two) times daily. Per patient's daughter Dr. Orson Aloe switched Apixaban (Eliquis) from 5 mg twice a day. Patient is now taking Apixaban (Eliquis)  2.5 mg twice a day.     clopidogrel (PLAVIX) 75 MG tablet TAKE 1 TABLET BY MOUTH DAILY 90 tablet 2   Dextromethorphan HBr 15 MG/5ML LIQD Take 5 mLs (15 mg total) by mouth 3 (three) times daily as needed. 118 mL 0   metFORMIN (GLUCOPHAGE) 1000 MG tablet Take 1,000 mg by mouth 2 (two) times daily with a meal.     metoprolol tartrate (LOPRESSOR) 25 MG tablet Take 0.5 tablets (12.5 mg total) by mouth 2 (two) times daily. 30 tablet 0   nitroGLYCERIN (NITROSTAT) 0.4 MG SL tablet Place 1 tablet (0.4 mg total) under the tongue every 5 (five) minutes as needed for chest pain. 25 tablet 6   potassium chloride (KLOR-CON) 10 MEQ tablet Take 1 tablet (10 mEq total) by mouth 2 (two) times daily. 60 tablet 0   rosuvastatin (CRESTOR) 10 MG tablet TAKE 1 TABLET BY MOUTH DAILY 90 tablet 3   tamsulosin (FLOMAX) 0.4 MG CAPS capsule Take 0.4 mg by mouth at bedtime.      torsemide (DEMADEX) 20 MG tablet Take 1/2 tablet by mouth only on Mondays, Wednesdays, and Fridays (Patient taking differently: Take 20 mg by mouth daily.) 12 tablet 6   triamcinolone cream (  KENALOG) 0.1 % Apply 1 application topically as needed (for itching).     No current facility-administered medications for this visit.    Allergies:   Patient has no known allergies.    Social History:  The patient  reports that he quit smoking about 54 years ago. His smoking use included cigarettes. He has a 8.00 pack-year smoking history. He has never used smokeless tobacco. He reports that he does not drink alcohol and does not use drugs.   Family History:  The patient's family history includes Heart attack in his father.    ROS: As per current history.  All other systems are negative.  Physical Exam: Blood pressure  124/80, pulse 66, height 5\' 9"  (1.753 m), weight 134 lb (60.8 kg).      GEN:  elderly , frail man ,  in no acute distress HEENT: Normal NECK: No JVD; No carotid bruits LYMPHATICS: No lymphadenopathy CARDIAC: RRR , no murmurs, rubs, gallops RESPIRATORY:  Clear to auscultation without rales, wheezing or rhonchi  ABDOMEN: Soft, non-tender, non-distended MUSCULOSKELETAL:  No edema; No deformity  SKIN: Warm and dry NEUROLOGIC:  dysarthria ,       EKG:        Recent Labs: 02/17/2023: ALT 19; Hemoglobin 10.8; Magnesium 1.9; Platelets 155 03/16/2023: BUN 23; Creatinine, Ser 1.16; Potassium 4.7; Sodium 142    Lipid Panel    Component Value Date/Time   CHOL 137 09/09/2022 1133   TRIG 83 09/09/2022 1133   HDL 88 09/09/2022 1133   CHOLHDL 1.6 09/09/2022 1133   CHOLHDL 2.3 10/15/2016 1215   VLDL 34 (H) 10/15/2016 1215   LDLCALC 33 09/09/2022 1133      Wt Readings from Last 3 Encounters:  06/01/23 134 lb (60.8 kg)  02/23/23 130 lb 12.8 oz (59.3 kg)  02/17/23 134 lb (60.8 kg)      Other studies Reviewed: Additional studies/ records that were reviewed today include: . Review of the above records demonstrates:    ASSESSMENT AND PLAN:  1.  Atrial fib:     stable     2.  Carotid artery stenosis:     3.  Acute congestive heart failure related to his pneumonia:      4.   HTN -    BP is well controlled.     3. Diabetes Mellitus  4. Hyperlipidemia -   stable    5. CAD, s/p LAD stent 2009 -     No angina     6.  Speech difficulty:   it seems like Jacaiden has had a stroke.   He will be seeing his primary MD today .   I think he would benefit from a neuro consultation          Current medicines are reviewed at length with the patient today.  The patient does not have concerns regarding medicines.  The following changes have been made:  no change  Labs/ tests ordered today include:   No orders of the defined types were placed in this  encounter.   Disposition:       Kristeen Miss, MD  06/01/2023 1:49 PM    [facility] [address] [place], [place], [place]  [address] Phone: [phone]; Fax: [phone]

## 2023-05-31 ENCOUNTER — Ambulatory Visit (HOSPITAL_COMMUNITY)
Admission: RE | Admit: 2023-05-31 | Discharge: 2023-05-31 | Disposition: A | Discharge status: Home / Self Care | Payer: Medicare Other | Source: Ambulatory Visit | Attending: Internal Medicine | Admitting: Internal Medicine

## 2023-05-31 DIAGNOSIS — R4701 Aphasia: Secondary | ICD-10-CM | POA: Diagnosis not present

## 2023-05-31 DIAGNOSIS — R1310 Dysphagia, unspecified: Secondary | ICD-10-CM | POA: Diagnosis present

## 2023-05-31 DIAGNOSIS — R1312 Dysphagia, oropharyngeal phase: Secondary | ICD-10-CM | POA: Diagnosis not present

## 2023-05-31 DIAGNOSIS — R059 Cough, unspecified: Secondary | ICD-10-CM | POA: Diagnosis not present

## 2023-05-31 NOTE — Progress Notes (Signed)
Modified Barium Swallow Study  Patient Details  Name: Todd Armstrong MRN: [id number] Date of Birth: 12/17/1930  Today's Date: 05/31/2023  HPI/PMH: HPI: Pt is a 89 year old male arriving for an OP MBS, currently receeving treatment at OP SLP. Pt and daughter report that about 6 months ago pt because coughing when drinking and eating and also suffered aphasia/dysarthria. Pt had some pna at that time as well. No diagnositc imaging has been done following appearance of these symptoms. Pt being treated for communication impairment. Pt is very active, walks daily. His comprehension is very good though conversation level speech is effortful, slow with many errors. Pt able to count and repeat. Naming is distorted. Oral motor exam appears WNL but in eating task pts tongue deviated right a bit.   Clinical Impression: Clinical Impression: Pt demonstrates moderate oropharyngeal dysphagia with primary impairment of delayed swallow initaition. Pt has silent trace aspiration of thin liquids before the swallow with a large straw sip and sensed aspiration of larger quantity with partial ejection before the swallow with a large sip take to transit a pill. There were also trace aspiration events with single straw sips with a chin tuck. Best methods to prevent aspiration are small, controlled single sips. Breath hold/supraglottic swallow also effective with small/medium sized sips, though this would need a great deal of practice. Otherwise, oral movement a little slow but strong and complete and pharyngeal strength good with only a trace quantity of residue. Recommend pt continue a regular diet and thin liquids, small single sips from a cup, pills whole in puree. Oral hygiene important. Nectar thick liquids were tolerated very well if needed, but hopeful that increased awareness and training with SLP can help pt better tolerate thin liquids.  Factors that may increase risk of adverse event in presence of aspiration  Rubye Oaks & Clearance Coots 2021): No data recorded  Recommendations/Plan: Swallowing Evaluation Recommendations Swallowing Evaluation Recommendations Recommendations: PO diet PO Diet Recommendation: Regular; Thin liquids (Level 0) Liquid Administration via: Cup; No straw Medication Administration: Whole meds with puree Supervision: Patient able to self-feed Swallowing strategies  : Small bites/sips; Slow rate; Hold breath before and during swallow (supraglottic swallow) Postural changes: Stay upright 30-60 min after meals; Position pt fully upright for meals Oral care recommendations: Oral care QID (4x/day)    Treatment Plan Treatment Plan Treatment recommendations: Defer treatment plan to SLP at other venue (see follow-up recommendations)     Recommendations Recommendations for follow up therapy are one component of a multi-disciplinary discharge planning process, led by the attending physician.  Recommendations may be updated based on patient status, additional functional criteria and insurance authorization.  Assessment: Orofacial Exam: No data recorded  Anatomy:  Anatomy: WFL   Boluses Administered: Boluses Administered Boluses Administered: Thin liquids (Level 0); Mildly thick liquids (Level 2, nectar thick); Moderately thick liquids (Level 3, honey thick); Puree; Solid     Oral Impairment Domain: Oral Impairment Domain Lip Closure: No labial escape Tongue control during bolus hold: Cohesive bolus between tongue to palatal seal Bolus preparation/mastication: Slow prolonged chewing/mashing with complete recollection Bolus transport/lingual motion: Slow tongue motion Oral residue: Trace residue lining oral structures Location of oral residue : N/A Initiation of pharyngeal swallow : Pyriform sinuses     Pharyngeal Impairment Domain: Pharyngeal Impairment Domain Soft palate elevation: No bolus between soft palate (SP)/pharyngeal wall (PW) Laryngeal elevation: Complete  superior movement of thyroid cartilage with complete approximation of arytenoids to epiglottic petiole Anterior hyoid excursion: Complete anterior movement Epiglottic movement: Complete  inversion Laryngeal vestibule closure: Complete, no air/contrast in laryngeal vestibule Pharyngeal stripping wave : Present - complete Pharyngeal contraction (A/P view only): N/A Pharyngoesophageal segment opening: Complete distension and complete duration, no obstruction of flow Tongue base retraction: Trace column of contrast or air between tongue base and PPW Pharyngeal residue: Trace residue within or on pharyngeal structures Location of pharyngeal residue: Valleculae; Pyriform sinuses; Tongue base     Esophageal Impairment Domain: Esophageal Impairment Domain Esophageal clearance upright position: Complete clearance, esophageal coating    Pill: Pill Consistency administered: Puree Puree: WFL    Penetration/Aspiration Scale Score: Penetration/Aspiration Scale Score 1.  Material does not enter airway: Thin liquids (Level 0); Mildly thick liquids (Level 2, nectar thick); Moderately thick liquids (Level 3, honey thick); Puree; Solid; Pill 7.  Material enters airway, passes BELOW cords and not ejected out despite cough attempt by patient: Thin liquids (Level 0) 8.  Material enters airway, passes BELOW cords without attempt by patient to eject out (silent aspiration) : Thin liquids (Level 0)    Compensatory Strategies: Compensatory Strategies Compensatory strategies: Yes Straw: Ineffective Ineffective Straw: Thin liquid (Level 0) Chin tuck: Ineffective Ineffective Chin Tuck: Thin liquid (Level 0) Supraglottic swallow: Effective Effective Supraglottic Swallow: Thin liquid (Level 0) Oral bolus hold: Effective Effective Oral Bolus Hold : Thin liquid (Level 0)       General Information: Caregiver present: Yes   Diet Prior to this Study: Regular; Thin liquids (Level 0)    No data  recorded   No data recorded   Supplemental O2: None (Room air)    History of Recent Intubation: No   Behavior/Cognition: Alert; Cooperative; Pleasant mood  Self-Feeding Abilities: Able to self-feed  Baseline vocal quality/speech: Dysphonic  Volitional Cough: Able to elicit  Volitional Swallow: Able to elicit  No data recorded  Goal Planning: Prognosis for improved oropharyngeal function: Good  No data recorded No data recorded No data recorded No data recorded  Pain: No data recorded  End of Session: Start Time:SLP Start Time (ACUTE ONLY): 1050  Stop Time: SLP Stop Time (ACUTE ONLY): 1120  Time Calculation:SLP Time Calculation (min) (ACUTE ONLY): 30 min  Charges: SLP Evaluations $ SLP Speech Visit: 1 Visit  SLP Evaluations $Outpatient MBS Swallow: 1 Procedure $Swallowing Treatment: 1 Procedure   SLP visit diagnosis: No data recorded   Past Medical History:  Past Medical History:  Diagnosis Date   Acute myocardial infarction    Anemia    Aortic insufficiency    Carotid stenosis    Chronic combined systolic and diastolic CHF (congestive heart failure) (HCC)    Coronary artery disease    PTCA and stenting of his right coronary artery. 3.5 x 16-mm Liberte stent.               Diabetes mellitus without complication (HCC)    Dyslipidemia    Emphysema (subcutaneous) (surgical) resulting from a procedure    Hyperlipemia    Hypertension    Mitral regurgitation    Partial tear of rotator cuff    right shoulder   Peripheral vascular disease (HCC)    Persistent atrial fibrillation (HCC)    Pulmonary hypertension (HCC)    Stroke [facility])    Wears dentures    Past Surgical History:  Past Surgical History:  Procedure Laterality Date   CARDIAC CATHETERIZATION  11/05/2008   CARDIAC CATHETERIZATION  10/22/1993   EF 60%   CARDIOVERSION N/A 06/05/2020   Procedure: CARDIOVERSION;  Surgeon: Vesta Mixer, MD;  Location: [facility] ENDOSCOPY;  Service: Cardiovascular;   Laterality: N/A;   CAROTID ENDARTERECTOMY Right 03/23/2018   CORONARY ANGIOPLASTY WITH STENT PLACEMENT     ENDARTERECTOMY Right 03/23/2018   Procedure: RIGHT CAROTID ARTERY ENDARTERECTOMY;  Surgeon: Maeola Harman, MD;  Location[facility] OR;  Service: Vascular;  Laterality: Right;   FRACTURE SURGERY     righ fibula and tibia   HERNIA REPAIR  1984   KNEE ARTHROSCOPY Left    MULTIPLE TOOTH EXTRACTIONS     PATCH ANGIOPLASTY Right 03/23/2018   Procedure: WITH Livia Snellen 1CM X 6CM PATCH ANGIOPLASTY;  Surgeon: Maeola Harman, MD;  Location: [facility] OR;  Service: Vascular;  Laterality: Right;   SHOULDER ARTHROSCOPY Right 06/09/2017   Procedure: ARTHROSCOPY SHOULDER SUBACROMIAL DECOMPRESSION AND ACROMIOPLASTY;  Surgeon: Marcene Corning, MD;  Location: [facility] OR;  Service: Orthopedics;  Laterality: Right;  GENERAL ANESTHESIA WITH BLOCK    Ardene Remley, Riley Nearing 05/31/2023, 2:28 PM

## 2023-06-01 ENCOUNTER — Ambulatory Visit: Payer: Medicare Other | Admitting: Speech Pathology

## 2023-06-01 ENCOUNTER — Ambulatory Visit
Admission: RE | Admit: 2023-06-01 | Discharge: 2023-06-01 | Disposition: A | Discharge status: Home / Self Care | Payer: Medicare Other | Source: Ambulatory Visit | Attending: Internal Medicine | Admitting: Internal Medicine

## 2023-06-01 ENCOUNTER — Other Ambulatory Visit: Payer: Self-pay | Admitting: Internal Medicine

## 2023-06-01 ENCOUNTER — Ambulatory Visit: Payer: Medicare Other | Attending: Cardiovascular Disease | Admitting: Cardiovascular Disease

## 2023-06-01 ENCOUNTER — Encounter: Payer: Self-pay | Admitting: Cardiovascular Disease

## 2023-06-01 VITALS — BP 124/80 | HR 66 | Ht 69.0 in | Wt 134.0 lb

## 2023-06-01 DIAGNOSIS — J189 Pneumonia, unspecified organism: Secondary | ICD-10-CM

## 2023-06-01 DIAGNOSIS — I10 Essential (primary) hypertension: Secondary | ICD-10-CM

## 2023-06-01 DIAGNOSIS — I2510 Atherosclerotic heart disease of native coronary artery without angina pectoris: Secondary | ICD-10-CM

## 2023-06-01 DIAGNOSIS — R1310 Dysphagia, unspecified: Secondary | ICD-10-CM

## 2023-06-01 DIAGNOSIS — R4701 Aphasia: Secondary | ICD-10-CM | POA: Diagnosis not present

## 2023-06-01 DIAGNOSIS — N1831 Chronic kidney disease, stage 3a: Secondary | ICD-10-CM | POA: Diagnosis not present

## 2023-06-01 DIAGNOSIS — R600 Localized edema: Secondary | ICD-10-CM | POA: Diagnosis not present

## 2023-06-01 DIAGNOSIS — J984 Other disorders of lung: Secondary | ICD-10-CM | POA: Diagnosis not present

## 2023-06-01 DIAGNOSIS — I5042 Chronic combined systolic (congestive) and diastolic (congestive) heart failure: Secondary | ICD-10-CM | POA: Diagnosis not present

## 2023-06-01 NOTE — Patient Instructions (Signed)
SWALLOWING EXERCISES Effortful Swallows - Squeeze hard with the muscles in your neck while you swallow your  saliva or a sip of water - Repeat 20 times, 2-3 times a day, and use whenever you eat or drink                  Day  Morning  Noon Evening   Wednesday      Thursday      Friday      Saturday      Sunday      Monday      Tuesday

## 2023-06-01 NOTE — Therapy (Deleted)
OBJECTIVE:   DIAGNOSTIC FINDINGS: ***  INSTRUMENTAL SWALLOW STUDY FINDINGS (MBSS:  *** Objective swallow impairments: *** Objective recommended compensations: ***  COGNITION: Overall cognitive status: {cognition:24006} Areas of impairment:  {cognitiveimpairmentslp:27409} Functional deficits: ***  SUBJECTIVE DYSPHAGIA REPORTS:  Date of onset: *** Reported symptoms: coughing with both solids and liquids, globus sensation, and regurgitation  Current diet: {slpdiet:27196}  Co-morbid voice changes: {yes/no:20286}  FACTORS WHICH MAY INCREASE RISK OF ADVERSE EVENT IN PRESENCE OF ASPIRATION:  General health: {CSE general health:29764}  Risk factors: {CSE risk factors:29763}    ORAL MOTOR EXAMINATION: Overall status: {OMESLP2:27645} Comments: ***  CLINICAL SWALLOW ASSESSMENT:   Dentition: {dentition:27197} Vocal quality at baseline: {VQL:27192} Patient directly observed with POs: {POobserved:27199} Feeding: {slp feeding:27200} Liquids provided by: {SLPliquids:27201} [facility] Swallow Protocol: {Pass/Fail (Optional):210140017} Oral phase signs and symptoms: {SLPoralphase:27202} Pharyngeal phase signs and symptoms: {SLPpharyngealphase:27203}  PATIENT REPORTED OUTCOME MEASURES (PROM): {SLPPROM:27095}

## 2023-06-01 NOTE — Patient Instructions (Signed)
Medication Instructions:  Your physician recommends that you continue on your current medications as directed. Please refer to the Current Medication list given to you today.  *If you need a refill on your cardiac medications before your next appointment, please call your pharmacy*   Lab Work: NONE If you have labs (blood work) drawn today and your tests are completely normal, you will receive your results only by: MyChart Message (if you have MyChart) OR A paper copy in the mail If you have any lab test that is abnormal or we need to change your treatment, we will call you to review the results.   Testing/Procedures: NONE   Follow-Up: At [facility], you and your health needs are our priority.  As part of our continuing mission to provide you with exceptional heart care, we have created designated Provider Care Teams.  These Care Teams include your primary Cardiologist (physician) and Advanced Practice Providers (APPs -  Physician Assistants and Nurse Practitioners) who all work together to provide you with the care you need, when you need it.  We recommend signing up for the patient portal called "MyChart".  Sign up information is provided on this After Visit Summary.  MyChart is used to connect with patients for Virtual Visits (Telemedicine).  Patients are able to view lab/test results, encounter notes, upcoming appointments, etc.  Non-urgent messages can be sent to your provider as well.   To learn more about what you can do with MyChart, go to [email or web address].    Your next appointment:   1 year(s)  Provider:   Philip Nahser, MD      

## 2023-06-01 NOTE — Therapy (Signed)
OUTPATIENT SPEECH LANGUAGE PATHOLOGY TREATMENT   Patient Name: Todd Armstrong MRN: [id number] DOB:1931/01/13, 89 y.o., male Today's Date: 06/01/2023  PCP: Emilio Aspen, MD REFERRING PROVIDER: Georgann Housekeeper, MD  END OF SESSION:  End of Session - 06/01/23 1013     Visit Number 4    Number of Visits 25    Date for SLP Re-Evaluation 08/11/23    Progress Note Due on Visit 10    SLP Start Time 1115    SLP Stop Time  1200    SLP Time Calculation (min) 45 min    Activity Tolerance Patient tolerated treatment well                Past Medical History:  Diagnosis Date   Acute myocardial infarction    Anemia    Aortic insufficiency    Carotid stenosis    Chronic combined systolic and diastolic CHF (congestive heart failure) (HCC)    Coronary artery disease    PTCA and stenting of his right coronary artery. 3.5 x 16-mm Liberte stent.               Diabetes mellitus without complication (HCC)    Dyslipidemia    Emphysema (subcutaneous) (surgical) resulting from a procedure    Hyperlipemia    Hypertension    Mitral regurgitation    Partial tear of rotator cuff    right shoulder   Peripheral vascular disease (HCC)    Persistent atrial fibrillation (HCC)    Pulmonary hypertension (HCC)    Stroke [facility])    Wears dentures    Past Surgical History:  Procedure Laterality Date   CARDIAC CATHETERIZATION  11/05/2008   CARDIAC CATHETERIZATION  10/22/1993   EF 60%   CARDIOVERSION N/A 06/05/2020   Procedure: CARDIOVERSION;  Surgeon: Vesta Mixer, MD;  Location: [facility] ENDOSCOPY;  Service: Cardiovascular;  Laterality: N/A;   CAROTID ENDARTERECTOMY Right 03/23/2018   CORONARY ANGIOPLASTY WITH STENT PLACEMENT     ENDARTERECTOMY Right 03/23/2018   Procedure: RIGHT CAROTID ARTERY ENDARTERECTOMY;  Surgeon: Maeola Harman, MD;  Location: [facility] OR;  Service: Vascular;  Laterality: Right;   FRACTURE SURGERY     righ fibula and tibia   HERNIA REPAIR  1984   KNEE  ARTHROSCOPY Left    MULTIPLE TOOTH EXTRACTIONS     PATCH ANGIOPLASTY Right 03/23/2018   Procedure: WITH Livia Snellen 1CM X 6CM PATCH ANGIOPLASTY;  Surgeon: Maeola Harman, MD;  Location: [facility] OR;  Service: Vascular;  Laterality: Right;   SHOULDER ARTHROSCOPY Right 06/09/2017   Procedure: ARTHROSCOPY SHOULDER SUBACROMIAL DECOMPRESSION AND ACROMIOPLASTY;  Surgeon: Marcene Corning, MD;  Location: [facility] OR;  Service: Orthopedics;  Laterality: Right;  GENERAL ANESTHESIA WITH BLOCK   Patient Active Problem List   Diagnosis Date Noted   Atrial fibrillation, persistent (HCC) 02/17/2023   CKD (chronic kidney disease) stage 3, GFR 30-59 ml/min (HCC) 02/17/2023   CHF exacerbation (HCC) 09/27/2021   Emphysema/COPD (HCC) 09/27/2021   Type 2 diabetes mellitus with hyperlipidemia (HCC) 09/27/2021   Pulmonary HTN (HCC) 06/03/2021   Persistent atrial fibrillation (HCC)    Carotid stenosis, asymptomatic, right 03/23/2018   Bilateral carotid bruits 10/15/2016   Acute myocardial infarction    Dyslipidemia    Coronary artery disease    Hypertension    Hyperlipemia     ONSET DATE: 04/27/23   REFERRING DIAG:  R13.10 (ICD-10-CM) - Dysphagia, unspecified    THERAPY DIAG:  Aphasia  Dysphagia, unspecified type  Rationale for Evaluation and Treatment: Rehabilitation  SUBJECTIVE:   SUBJECTIVE STATEMENT: "Long day"  Pt accompanied by: family member  PERTINENT HISTORY:   Patient complains of Dysphagia patient is awaiting the speech evaluation scheduled according to the daughter next week. He had had pneumonia probably aspiration pneumonia he is getting frail and weakness probably having swallowing muscle weakness.  Difficulty in coughing even with liquids and solid as per the daughter.          His other issue is constant nasal drip and drainage specially worse with eating more rhinitis that also is making things worse.   PAIN:  Are you having pain? No  FALLS: Has patient fallen in last 6 months?   Yes  LIVING ENVIRONMENT: Lives with: lives with their family and lives with their daughter Lives in: House/apartment  PLOF:  Level of assistance: Independent with ADLs, Independent with IADLs Employment: Retired  PATIENT GOALS: "get Engineer, manufacturing systems.   OBJECTIVE:   DIAGNOSTIC FINDINGS: Procedure: After adequate topical anesthetic was applied, 2 mm flexible laryngoscope was passed through the nasal cavity without difficulty. Flexible laryngoscopy shows patent anterior nasal cavity with minimal crusting, no discharge or infection.  Normal base of tongue and supraglottis Normal vocal cord mobility with slight bowing of true vocal folds, slight suggestion of bilateral nodule formation at the posterior one third of each cord. The hypopharynx normal without mass, pooling of secretions or aspiration.  Patient tolerated procedure without complication or difficulty.  Kermit Balo. [place], PA-C  Impression & Plans:   1) hoarseness 2) presbylaryngis   COGNITION: Overall cognitive status: Within functional limits for tasks assessed Functional deficits: pt and daughter endorse no cognitive deficits   ORAL MOTOR EXAMINATION: Overall status: WFL Comments: Horse, breathy, and weak voice. Patient demonstrated with slight left side deviation observed during jaw opening.   CLINICAL SWALLOW ASSESSMENT:   Current diet: regular Dentition: Dentures top/bottom Patient directly observed with POs: Yes: thin liquids  Feeding: able to feed self Liquids provided by: cup Oral phase signs and symptoms:  No signs Pharyngeal phase signs and symptoms: immediate cough, perceptually appears weak; reddening face; shortness of breath  Patient report: ongoing coughing with thin liquids with usual coughing and sensation of liquid "going the wrong way." Endorses shortness of breath, denies odynophagia. Occasional cough with solids, sensation of "sticking" though not as frequently occurring as coughing with  liquid. Recent hx of PNA. Began ~6 months ago, co-occurring with acute challenges with word finding which appears c/w aphasia.   AUDITORY COMPREHENSION: Overall auditory comprehension: Appears intact YES/NO questions: Appears intact Following directions: Appears intact Conversation: Moderately Complex  READING COMPREHENSION: Intact  EXPRESSION: verbal  VERBAL EXPRESSION: Level of generative/spontaneous verbalization: conversation, though reduced MLU  Automatic speech: counting: intact  Repetition: Impaired: Word Naming: Confrontation:  54% Pragmatics: Appears intact Discourse: reduced MLU, reduced rate, mixed paraphasias (primarily phonemic), agrammatism, anomia; no evidence of apraxia Effective technique: none trialed d/t time constraints  Non-verbal means of communication: none evidenced  STANDARDIZED ASSESSMENTS: QAB: Moderate (6.04)   PATIENT REPORTED OUTCOME MEASURES (PROM): EAT-10: 36/40    TODAY'S TREATMENT:  DATE:   06/01/23: ST discussed results from MBSS with patient and daughter, all questions answered to patient satisfaction. Education on effortful swallow with modeling, patient unable to swallow with a dry mouth, recommended 5 mL water. Patient required frequent mod-A with models and reminders to breathe. Patient sent home with HEP of 20 effortful swallows 2-3x day. Targeted word finding using Response Elaboration Training, through picture description task. Patient was able to answer questions related to the topic, and relate it to personal topics with min-A. Speech intelligibility continues to be decreased.    05/27/23: Targeted word finding through SFA. Patient required frequent min-A to find words related to topic. He struggled the most with finding the category what the item reminded him of. Patient stated that he enjoyed this  intervention more then VNeST, but that both were the same in difficulty. Clarify of voice remains the same throughout the session due to vocal strain. MBSS scheduled for 6/18, and SLP will review results next session. All questions answered to patient satisfaction.  05/24/23: Targeted word finding, verbal apraxia, sentence generation with Scientist, product/process development (VNeST). For the verbs watch, play, cook, read, the patient generated 3 subject and objects for each verb, a total of three subjects and objects with mod-A verbal, questioning, written cues required. Pt generated complex sentences, 1 for each verb, by answering "wh" questions with mod-A cues, for a total of 12 complex sentences. Daughter was able to aid in some clarifications during times of poor intelligibility due to increase in background knowledge.     05/19/23: Evaluation completed. MBSS explained and all questions answered. Reviewed POC and goals with pt and daughter verbalizing agreement.   PATIENT EDUCATION: Education details: See above Person educated: Patient and Child(ren) Education method: Explanation Education comprehension: verbalized understanding   ASSESSMENT:  CLINICAL IMPRESSION: Patient is a 89 y.o. M who was seen today for dysphagia evaluation. Clinical swallow evaluation significant for s/sx of pharyngeal dysphagia with thin liquid trial. Patient presented with immediate cough after thin liquid trial with subsequent shortness of breath. MBSS recommended, with pt and daughter endorsing agreement. Patient presented with hoarse and strained voice, and stated that he often runs out of air when eating and speaking. Denied change is voice presentation though reduced intelligibility resulted from vocal strain. QAB indicates moderate aphasia. Pt's receptive language appears grossly intact, expressive language is c/b slow, halting speech with agrammatism, anomia, and paraphasias. Discourse cohesion negatively impacted,  communication partner required to A pt in expressing desired message. Pt's and daughter endorse this is new, co-occurring with swallow changes which began 6 months ago without clear etiology. Chart review w/o brain imaging so unable to determine if organic etiology may be impacting these changes. PCP may wish to consider brain imaging to further evaluate the cause of these new challenges. Swallowing challenges could potentially be related to hx of voice changes, endorsed by ENT visit 05/2022, however does not explain new challenges with word finding. Pt would benefit from ST to address dysphagia and aphasia to maximize independence, increase safety during the swallow, and improve communication efficacy.    OBJECTIVE IMPAIRMENTS: include voice disorder and dysphagia. These impairments are limiting patient from safety when swallowing. Factors affecting potential to achieve goals and functional outcome are previous level of function. Patient will benefit from skilled SLP services to address above impairments and improve overall function.  REHAB POTENTIAL: Good   GOALS: Goals reviewed with patient? Yes  SHORT TERM GOALS: Target date: 06/16/2023  Pt will participate in MBSS  Baseline: Goal status: IN PROGRESS  2.  Pt will teach back and demonstrate use of swallow strategies and targeted swallow exercises with 100% accuracy, with use of visual aid PRN Baseline:  Goal status: IN PROGRESS  3.  Pt will demonstrate completion of x4 potential aphasia HEP activities with mod-A from SLP  Baseline:  Goal status: IN PROGRESS  4.  Pt will report HEP for dysphagia/aphasia completion 5/7 days over 1 week period Baseline:  Goal status: IN PROGRESS   LONG TERM GOALS: Target date: 08/11/2023  Pt will report successful completion of HEP 5/7 days over 2 week period with support from daughter Baseline:  Goal status: IN PROGRESS  2.  Pt will complete structured language tasks with 80% accuracy given  occasional mod-A Baseline:  Goal status: IN PROGRESS  3.  Pt will report improvement via EAT-10 by dc Baseline:  Goal status: IN PROGRESS  4.  Pt will complete f/u MBSS, if indicated  Baseline:  Goal status: IN PROGRESS  PLAN:  SLP FREQUENCY: 2x/week  SLP DURATION: 12 weeks  PLANNED INTERVENTIONS: Aspiration precaution training, Pharyngeal strengthening exercises, Diet toleration management , Language facilitation, Trials of upgraded texture/liquids, Cueing hierachy, Functional tasks, Multimodal communication approach, SLP instruction and feedback, Compensatory strategies, Patient/family education, and Re-evaluation  [place], Student-SLP 06/01/2023, 10:14 AM

## 2023-06-03 ENCOUNTER — Ambulatory Visit: Payer: Medicare Other | Admitting: Speech Pathology

## 2023-06-03 DIAGNOSIS — R4701 Aphasia: Secondary | ICD-10-CM | POA: Diagnosis not present

## 2023-06-03 DIAGNOSIS — R1310 Dysphagia, unspecified: Secondary | ICD-10-CM

## 2023-06-03 NOTE — Therapy (Signed)
OUTPATIENT SPEECH LANGUAGE PATHOLOGY TREATMENT   Patient Name: Todd Armstrong MRN: [id number] DOB:[birth year], 89 y.o., male Today's Date: 06/03/2023  PCP: Emilio Aspen, MD REFERRING PROVIDER: Georgann Housekeeper, MD  END OF SESSION:  End of Session - 06/03/23 1093     Visit Number 5    Number of Visits 25    Date for SLP Re-Evaluation 08/11/23    Progress Note Due on Visit 10    SLP Start Time 0930    SLP Stop Time  1015    SLP Time Calculation (min) 45 min    Activity Tolerance Patient tolerated treatment well                 Past Medical History:  Diagnosis Date   Acute myocardial infarction    Anemia    Aortic insufficiency    Carotid stenosis    Chronic combined systolic and diastolic CHF (congestive heart failure) (HCC)    Coronary artery disease    PTCA and stenting of his right coronary artery. 3.5 x 16-mm Liberte stent.               Diabetes mellitus without complication (HCC)    Dyslipidemia    Emphysema (subcutaneous) (surgical) resulting from a procedure    Hyperlipemia    Hypertension    Mitral regurgitation    Partial tear of rotator cuff    right shoulder   Peripheral vascular disease (HCC)    Persistent atrial fibrillation (HCC)    Pulmonary hypertension (HCC)    Stroke [facility])    Wears dentures    Past Surgical History:  Procedure Laterality Date   CARDIAC CATHETERIZATION  11/05/2008   CARDIAC CATHETERIZATION  10/22/1993   EF 60%   CARDIOVERSION N/A 06/05/2020   Procedure: CARDIOVERSION;  Surgeon: Vesta Mixer, MD;  Location: [facility] ENDOSCOPY;  Service: Cardiovascular;  Laterality: N/A;   CAROTID ENDARTERECTOMY Right 03/23/2018   CORONARY ANGIOPLASTY WITH STENT PLACEMENT     ENDARTERECTOMY Right 03/23/2018   Procedure: RIGHT CAROTID ARTERY ENDARTERECTOMY;  Surgeon: Maeola Harman, MD;  Location: [facility] OR;  Service: Vascular;  Laterality: Right;   FRACTURE SURGERY     righ fibula and tibia   HERNIA REPAIR  1984   KNEE  ARTHROSCOPY Left    MULTIPLE TOOTH EXTRACTIONS     PATCH ANGIOPLASTY Right 03/23/2018   Procedure: WITH Livia Snellen 1CM X 6CM PATCH ANGIOPLASTY;  Surgeon: Maeola Harman, MD;  Location: [facility] OR;  Service: Vascular;  Laterality: Right;   SHOULDER ARTHROSCOPY Right 06/09/2017   Procedure: ARTHROSCOPY SHOULDER SUBACROMIAL DECOMPRESSION AND ACROMIOPLASTY;  Surgeon: Marcene Corning, MD;  Location: [facility] OR;  Service: Orthopedics;  Laterality: Right;  GENERAL ANESTHESIA WITH BLOCK   Patient Active Problem List   Diagnosis Date Noted   Atrial fibrillation, persistent (HCC) 02/17/2023   CKD (chronic kidney disease) stage 3, GFR 30-59 ml/min (HCC) 02/17/2023   CHF exacerbation (HCC) 09/27/2021   Emphysema/COPD (HCC) 09/27/2021   Type 2 diabetes mellitus with hyperlipidemia (HCC) 09/27/2021   Pulmonary HTN (HCC) 06/03/2021   Persistent atrial fibrillation (HCC)    Carotid stenosis, asymptomatic, right 03/23/2018   Bilateral carotid bruits 10/15/2016   Acute myocardial infarction    Dyslipidemia    Coronary artery disease    Hypertension    Hyperlipemia     ONSET DATE: 04/27/23   REFERRING DIAG:  R13.10 (ICD-10-CM) - Dysphagia, unspecified    THERAPY DIAG:  No diagnosis found.  Rationale for Evaluation and Treatment: Rehabilitation  SUBJECTIVE:  SUBJECTIVE STATEMENT: "Good" Pt accompanied by: family member  PERTINENT HISTORY:   Patient complains of Dysphagia patient is awaiting the speech evaluation scheduled according to the daughter next week. He had had pneumonia probably aspiration pneumonia he is getting frail and weakness probably having swallowing muscle weakness.  Difficulty in coughing even with liquids and solid as per the daughter.          His other issue is constant nasal drip and drainage specially worse with eating more rhinitis that also is making things worse.   PAIN:  Are you having pain? No  FALLS: Has patient fallen in last 6 months?  Yes  LIVING  ENVIRONMENT: Lives with: lives with their family and lives with their daughter Lives in: House/apartment  PLOF:  Level of assistance: Independent with ADLs, Independent with IADLs Employment: Retired  PATIENT GOALS: "get Engineer, manufacturing systems.   OBJECTIVE:   DIAGNOSTIC FINDINGS: Procedure: After adequate topical anesthetic was applied, 2 mm flexible laryngoscope was passed through the nasal cavity without difficulty. Flexible laryngoscopy shows patent anterior nasal cavity with minimal crusting, no discharge or infection.  Normal base of tongue and supraglottis Normal vocal cord mobility with slight bowing of true vocal folds, slight suggestion of bilateral nodule formation at the posterior one third of each cord. The hypopharynx normal without mass, pooling of secretions or aspiration.  Patient tolerated procedure without complication or difficulty.  Kermit Balo. [place], PA-C  Impression & Plans:   1) hoarseness 2) presbylaryngis   COGNITION: Overall cognitive status: Within functional limits for tasks assessed Functional deficits: pt and daughter endorse no cognitive deficits   ORAL MOTOR EXAMINATION: Overall status: WFL Comments: Horse, breathy, and weak voice. Patient demonstrated with slight left side deviation observed during jaw opening.   CLINICAL SWALLOW ASSESSMENT:   Current diet: regular Dentition: Dentures top/bottom Patient directly observed with POs: Yes: thin liquids  Feeding: able to feed self Liquids provided by: cup Oral phase signs and symptoms:  No signs Pharyngeal phase signs and symptoms: immediate cough, perceptually appears weak; reddening face; shortness of breath  Patient report: ongoing coughing with thin liquids with usual coughing and sensation of liquid "going the wrong way." Endorses shortness of breath, denies odynophagia. Occasional cough with solids, sensation of "sticking" though not as frequently occurring as coughing with liquid. Recent  hx of PNA. Began ~6 months ago, co-occurring with acute challenges with word finding which appears c/w aphasia.   AUDITORY COMPREHENSION: Overall auditory comprehension: Appears intact YES/NO questions: Appears intact Following directions: Appears intact Conversation: Moderately Complex  READING COMPREHENSION: Intact  EXPRESSION: verbal  VERBAL EXPRESSION: Level of generative/spontaneous verbalization: conversation, though reduced MLU  Automatic speech: counting: intact  Repetition: Impaired: Word Naming: Confrontation:  54% Pragmatics: Appears intact Discourse: reduced MLU, reduced rate, mixed paraphasias (primarily phonemic), agrammatism, anomia; no evidence of apraxia Effective technique: none trialed d/t time constraints  Non-verbal means of communication: none evidenced  STANDARDIZED ASSESSMENTS: QAB: Moderate (6.04)   PATIENT REPORTED OUTCOME MEASURES (PROM): EAT-10: 36/40    TODAY'S TREATMENT:  DATE:   06/03/23: ST targeting dysphagia and spoken expression (word finding and speech intelligibility)  92526: Reviewed how HEP went at home, pt stated that he was able to complete 3 sets of 10 of the hard swallows and states that exercise was easy for him. SLP led through 20 effortful swallows during today's session.  40981: SLP targeted articulation and intelligibility on the word level by having patient name items within a category, and rating intelligibility scale 1-3. Patient's rating of intelligibly matched the SLP's 100% of the time. Patient required min-mod-A during today's session consisting of models, and reminders to slow down. Clarity was better at the single word level, and decreased with phrases. Led through functional phrases, patient able to increase intelligibility after prompts to check in on his speech. Patient improved throughout  the session on taking time and breaking up his speech sounds. HEP given to continue effortful swallows of 3x for 20 times. Education given on tongue movement in regard to producing the /h/ and /k/ sound.   06/01/23: ST discussed results from MBSS with patient and daughter, all questions answered to patient satisfaction. Education on effortful swallow with modeling, patient unable to swallow with a dry mouth, recommended 5 mL water. Patient required frequent mod-A with models and reminders to breathe. Patient sent home with HEP of 20 effortful swallows 2-3x day. Targeted word finding using Response Elaboration Training, through picture description task. Patient was able to answer questions related to the topic, and relate it to personal topics with min-A. Speech intelligibility continues to be decreased.    05/27/23: Targeted word finding through SFA. Patient required frequent min-A to find words related to topic. He struggled the most with finding the category what the item reminded him of. Patient stated that he enjoyed this intervention more then VNeST, but that both were the same in difficulty. Clarify of voice remains the same throughout the session due to vocal strain. MBSS scheduled for 6/18, and SLP will review results next session. All questions answered to patient satisfaction.  05/24/23: Targeted word finding, verbal apraxia, sentence generation with Scientist, product/process development (VNeST). For the verbs watch, play, cook, read, the patient generated 3 subject and objects for each verb, a total of three subjects and objects with mod-A verbal, questioning, written cues required. Pt generated complex sentences, 1 for each verb, by answering "wh" questions with mod-A cues, for a total of 12 complex sentences. Daughter was able to aid in some clarifications during times of poor intelligibility due to increase in background knowledge.     05/19/23: Evaluation completed. MBSS explained and all questions  answered. Reviewed POC and goals with pt and daughter verbalizing agreement.   PATIENT EDUCATION: Education details: See above Person educated: Patient and Child(ren) Education method: Explanation Education comprehension: verbalized understanding   ASSESSMENT:  CLINICAL IMPRESSION: Patient is a 89 y.o. M who was seen today for dysphagia evaluation. Clinical swallow evaluation significant for s/sx of pharyngeal dysphagia with thin liquid trial. Patient presented with immediate cough after thin liquid trial with subsequent shortness of breath. MBSS recommended, with pt and daughter endorsing agreement. Patient presented with hoarse and strained voice, and stated that he often runs out of air when eating and speaking. Denied change is voice presentation though reduced intelligibility resulted from vocal strain. QAB indicates moderate aphasia. Pt's receptive language appears grossly intact, expressive language is c/b slow, halting speech with agrammatism, anomia, and paraphasias. Discourse cohesion negatively impacted, communication partner required to A pt in expressing desired message.  Pt's and daughter endorse this is new, co-occurring with swallow changes which began 6 months ago without clear etiology. Chart review w/o brain imaging so unable to determine if organic etiology may be impacting these changes. PCP may wish to consider brain imaging to further evaluate the cause of these new challenges. Swallowing challenges could potentially be related to hx of voice changes, endorsed by ENT visit 05/2022, however does not explain new challenges with word finding. Pt would benefit from ST to address dysphagia and aphasia to maximize independence, increase safety during the swallow, and improve communication efficacy.    OBJECTIVE IMPAIRMENTS: include voice disorder and dysphagia. These impairments are limiting patient from safety when swallowing. Factors affecting potential to achieve goals and  functional outcome are previous level of function. Patient will benefit from skilled SLP services to address above impairments and improve overall function.  REHAB POTENTIAL: Good   GOALS: Goals reviewed with patient? Yes  SHORT TERM GOALS: Target date: 06/16/2023  Pt will participate in MBSS Baseline: Goal status: IN PROGRESS  2.  Pt will teach back and demonstrate use of swallow strategies and targeted swallow exercises with 100% accuracy, with use of visual aid PRN Baseline:  Goal status: IN PROGRESS  3.  Pt will demonstrate completion of x4 potential aphasia HEP activities with mod-A from SLP  Baseline:  Goal status: IN PROGRESS  4.  Pt will report HEP for dysphagia/aphasia completion 5/7 days over 1 week period Baseline:  Goal status: IN PROGRESS   LONG TERM GOALS: Target date: 08/11/2023  Pt will report successful completion of HEP 5/7 days over 2 week period with support from daughter Baseline:  Goal status: IN PROGRESS  2.  Pt will complete structured language tasks with 80% accuracy given occasional mod-A Baseline:  Goal status: IN PROGRESS  3.  Pt will report improvement via EAT-10 by dc Baseline:  Goal status: IN PROGRESS  4.  Pt will complete f/u MBSS, if indicated  Baseline:  Goal status: IN PROGRESS  PLAN:  SLP FREQUENCY: 2x/week  SLP DURATION: 12 weeks  PLANNED INTERVENTIONS: Aspiration precaution training, Pharyngeal strengthening exercises, Diet toleration management , Language facilitation, Trials of upgraded texture/liquids, Cueing hierachy, Functional tasks, Multimodal communication approach, SLP instruction and feedback, Compensatory strategies, Patient/family education, and Re-evaluation  [place], Student-SLP 06/03/2023, 9:29 AM

## 2023-06-21 ENCOUNTER — Ambulatory Visit: Payer: Medicare Other | Attending: Internal Medicine | Admitting: Speech Pathology

## 2023-06-21 DIAGNOSIS — R4701 Aphasia: Secondary | ICD-10-CM | POA: Insufficient documentation

## 2023-06-21 DIAGNOSIS — R471 Dysarthria and anarthria: Secondary | ICD-10-CM | POA: Insufficient documentation

## 2023-06-21 DIAGNOSIS — R1310 Dysphagia, unspecified: Secondary | ICD-10-CM | POA: Diagnosis not present

## 2023-06-21 NOTE — Therapy (Signed)
OUTPATIENT SPEECH LANGUAGE PATHOLOGY TREATMENT   Patient Name: Todd Armstrong MRN: [id number] DOB:[birth year], 89 y.o., male Today's Date: 06/21/2023  PCP: Emilio Aspen, MD REFERRING PROVIDER: Georgann Housekeeper, MD  END OF SESSION:  End of Session - 06/21/23 1011     Visit Number 6    Number of Visits 25    Date for SLP Re-Evaluation 08/11/23    Progress Note Due on Visit 10    SLP Start Time 0930    SLP Stop Time  1015    SLP Time Calculation (min) 45 min    Activity Tolerance Patient tolerated treatment well                  Past Medical History:  Diagnosis Date   Acute myocardial infarction    Anemia    Aortic insufficiency    Carotid stenosis    Chronic combined systolic and diastolic CHF (congestive heart failure) (HCC)    Coronary artery disease    PTCA and stenting of his right coronary artery. 3.5 x 16-mm Liberte stent.               Diabetes mellitus without complication (HCC)    Dyslipidemia    Emphysema (subcutaneous) (surgical) resulting from a procedure    Hyperlipemia    Hypertension    Mitral regurgitation    Partial tear of rotator cuff    right shoulder   Peripheral vascular disease (HCC)    Persistent atrial fibrillation (HCC)    Pulmonary hypertension (HCC)    Stroke [facility])    Wears dentures    Past Surgical History:  Procedure Laterality Date   CARDIAC CATHETERIZATION  11/05/2008   CARDIAC CATHETERIZATION  10/22/1993   EF 60%   CARDIOVERSION N/A 06/05/2020   Procedure: CARDIOVERSION;  Surgeon: Vesta Mixer, MD;  Location: [facility] ENDOSCOPY;  Service: Cardiovascular;  Laterality: N/A;   CAROTID ENDARTERECTOMY Right 03/23/2018   CORONARY ANGIOPLASTY WITH STENT PLACEMENT     ENDARTERECTOMY Right 03/23/2018   Procedure: RIGHT CAROTID ARTERY ENDARTERECTOMY;  Surgeon: Maeola Harman, MD;  Location: [facility] OR;  Service: Vascular;  Laterality: Right;   FRACTURE SURGERY     righ fibula and tibia   HERNIA REPAIR  1984   KNEE  ARTHROSCOPY Left    MULTIPLE TOOTH EXTRACTIONS     PATCH ANGIOPLASTY Right 03/23/2018   Procedure: WITH Livia Snellen 1CM X 6CM PATCH ANGIOPLASTY;  Surgeon: Maeola Harman, MD;  Location: [facility] OR;  Service: Vascular;  Laterality: Right;   SHOULDER ARTHROSCOPY Right 06/09/2017   Procedure: ARTHROSCOPY SHOULDER SUBACROMIAL DECOMPRESSION AND ACROMIOPLASTY;  Surgeon: Marcene Corning, MD;  Location: [facility] OR;  Service: Orthopedics;  Laterality: Right;  GENERAL ANESTHESIA WITH BLOCK   Patient Active Problem List   Diagnosis Date Noted   Atrial fibrillation, persistent (HCC) 02/17/2023   CKD (chronic kidney disease) stage 3, GFR 30-59 ml/min (HCC) 02/17/2023   CHF exacerbation (HCC) 09/27/2021   Emphysema/COPD (HCC) 09/27/2021   Type 2 diabetes mellitus with hyperlipidemia (HCC) 09/27/2021   Pulmonary HTN (HCC) 06/03/2021   Persistent atrial fibrillation (HCC)    Carotid stenosis, asymptomatic, right 03/23/2018   Bilateral carotid bruits 10/15/2016   Acute myocardial infarction    Dyslipidemia    Coronary artery disease    Hypertension    Hyperlipemia     ONSET DATE: 04/27/23   REFERRING DIAG:  R13.10 (ICD-10-CM) - Dysphagia, unspecified    THERAPY DIAG:  Dysphagia, unspecified type  Aphasia  Rationale for Evaluation and Treatment:  Rehabilitation  SUBJECTIVE:   SUBJECTIVE STATEMENT: "I am struggling to understand him now"  Pt accompanied by: family member  PERTINENT HISTORY:   Patient complains of Dysphagia patient is awaiting the speech evaluation scheduled according to the daughter next week. He had had pneumonia probably aspiration pneumonia he is getting frail and weakness probably having swallowing muscle weakness.  Difficulty in coughing even with liquids and solid as per the daughter.          His other issue is constant nasal drip and drainage specially worse with eating more rhinitis that also is making things worse.   PAIN:  Are you having pain? No  FALLS: Has  patient fallen in last 6 months?  Yes  LIVING ENVIRONMENT: Lives with: lives with their family and lives with their daughter Lives in: House/apartment  PLOF:  Level of assistance: Independent with ADLs, Independent with IADLs Employment: Retired  PATIENT GOALS: "get Engineer, manufacturing systems.   OBJECTIVE:   DIAGNOSTIC FINDINGS: Procedure: After adequate topical anesthetic was applied, 2 mm flexible laryngoscope was passed through the nasal cavity without difficulty. Flexible laryngoscopy shows patent anterior nasal cavity with minimal crusting, no discharge or infection.  Normal base of tongue and supraglottis Normal vocal cord mobility with slight bowing of true vocal folds, slight suggestion of bilateral nodule formation at the posterior one third of each cord. The hypopharynx normal without mass, pooling of secretions or aspiration.  Patient tolerated procedure without complication or difficulty.  Kermit Balo. [place]hour, PA-C  Impression & Plans:   1) hoarseness 2) presbylaryngis   COGNITION: Overall cognitive status: Within functional limits for tasks assessed Functional deficits: pt and daughter endorse no cognitive deficits   ORAL MOTOR EXAMINATION: Overall status: WFL Comments: Horse, breathy, and weak voice. Patient demonstrated with slight left side deviation observed during jaw opening.   CLINICAL SWALLOW ASSESSMENT:   Current diet: regular Dentition: Dentures top/bottom Patient directly observed with POs: Yes: thin liquids  Feeding: able to feed self Liquids provided by: cup Oral phase signs and symptoms:  No signs Pharyngeal phase signs and symptoms: immediate cough, perceptually appears weak; reddening face; shortness of breath  Patient report: ongoing coughing with thin liquids with usual coughing and sensation of liquid "going the wrong way." Endorses shortness of breath, denies odynophagia. Occasional cough with solids, sensation of "sticking" though not as  frequently occurring as coughing with liquid. Recent hx of PNA. Began ~6 months ago, co-occurring with acute challenges with word finding which appears c/w aphasia.   AUDITORY COMPREHENSION: Overall auditory comprehension: Appears intact YES/NO questions: Appears intact Following directions: Appears intact Conversation: Moderately Complex  READING COMPREHENSION: Intact  EXPRESSION: verbal  VERBAL EXPRESSION: Level of generative/spontaneous verbalization: conversation, though reduced MLU  Automatic speech: counting: intact  Repetition: Impaired: Word Naming: Confrontation:  54% Pragmatics: Appears intact Discourse: reduced MLU, reduced rate, mixed paraphasias (primarily phonemic), agrammatism, anomia; no evidence of apraxia Effective technique: none trialed d/t time constraints  Non-verbal means of communication: none evidenced  STANDARDIZED ASSESSMENTS: QAB: Moderate (6.04)   PATIENT REPORTED OUTCOME MEASURES (PROM): EAT-10: 36/40    TODAY'S TREATMENT:  DATE:   06/21/23: Patient states that HEP was implemented of 10 effortful swallows. Patient read through conversational phrases with 40% intelligibility, with reminders to take breaths and take time. Daughter states that speech clarity has gotten worse and she struggles to understand him now. SLP led through SOVTE to warm up voice. Patient struggled with higher pitches, with and without the straw and stated that it took more effort. Patient able to increase tone through straw with max-A of models, specific and immediate feedback. Education on how voice and swallow are related, as well as how looking at someone who is speaking to increase clarity. HEP given of SOVTE daily to warm up voice, reading through tri-syllabic words, as well as reading Bible out loud to increase clarity. All questions answered to  patient satisfaction. Next session will focus on tri-syllabic words.   06/03/23: ST targeting dysphagia and spoken expression (word finding and speech intelligibility)  92526: Reviewed how HEP went at home, pt stated that he was able to complete 3 sets of 10 of the hard swallows and states that exercise was easy for him. SLP led through 20 effortful swallows during today's session.  40981: SLP targeted articulation and intelligibility on the word level by having patient name items within a category, and rating intelligibility scale 1-3. Patient's rating of intelligibly matched the SLP's 100% of the time. Patient required min-mod-A during today's session consisting of models, and reminders to slow down. Clarity was better at the single word level, and decreased with phrases. Led through functional phrases, patient able to increase intelligibility after prompts to check in on his speech. Patient improved throughout the session on taking time and breaking up his speech sounds. HEP given to continue effortful swallows of 3x for 20 times. Education given on tongue movement in regard to producing the /h/ and /k/ sound.   06/01/23: ST discussed results from MBSS with patient and daughter, all questions answered to patient satisfaction. Education on effortful swallow with modeling, patient unable to swallow with a dry mouth, recommended 5 mL water. Patient required frequent mod-A with models and reminders to breathe. Patient sent home with HEP of 20 effortful swallows 2-3x day. Targeted word finding using Response Elaboration Training, through picture description task. Patient was able to answer questions related to the topic, and relate it to personal topics with min-A. Speech intelligibility continues to be decreased.    05/27/23: Targeted word finding through SFA. Patient required frequent min-A to find words related to topic. He struggled the most with finding the category what the item reminded him of. Patient  stated that he enjoyed this intervention more then VNeST, but that both were the same in difficulty. Clarify of voice remains the same throughout the session due to vocal strain. MBSS scheduled for 6/18, and SLP will review results next session. All questions answered to patient satisfaction.  05/24/23: Targeted word finding, verbal apraxia, sentence generation with Scientist, product/process development (VNeST). For the verbs watch, play, cook, read, the patient generated 3 subject and objects for each verb, a total of three subjects and objects with mod-A verbal, questioning, written cues required. Pt generated complex sentences, 1 for each verb, by answering "wh" questions with mod-A cues, for a total of 12 complex sentences. Daughter was able to aid in some clarifications during times of poor intelligibility due to increase in background knowledge.     05/19/23: Evaluation completed. MBSS explained and all questions answered. Reviewed POC and goals with pt and daughter verbalizing agreement.  PATIENT EDUCATION: Education details: See above Person educated: Patient and Child(ren) Education method: Explanation Education comprehension: verbalized understanding   ASSESSMENT:  CLINICAL IMPRESSION: Patient is a 89 y.o. M who was seen today for dysphagia evaluation. Clinical swallow evaluation significant for s/sx of pharyngeal dysphagia with thin liquid trial. Patient presented with immediate cough after thin liquid trial with subsequent shortness of breath. MBSS recommended, with pt and daughter endorsing agreement. Patient presented with hoarse and strained voice, and stated that he often runs out of air when eating and speaking. Denied change is voice presentation though reduced intelligibility resulted from vocal strain. QAB indicates moderate aphasia. Pt's receptive language appears grossly intact, expressive language is c/b slow, halting speech with agrammatism, anomia, and paraphasias. Discourse  cohesion negatively impacted, communication partner required to A pt in expressing desired message. Pt's and daughter endorse this is new, co-occurring with swallow changes which began 6 months ago without clear etiology. Chart review w/o brain imaging so unable to determine if organic etiology may be impacting these changes. PCP may wish to consider brain imaging to further evaluate the cause of these new challenges. Swallowing challenges could potentially be related to hx of voice changes, endorsed by ENT visit 05/2022, however does not explain new challenges with word finding. Pt would benefit from ST to address dysphagia and aphasia to maximize independence, increase safety during the swallow, and improve communication efficacy.    OBJECTIVE IMPAIRMENTS: include voice disorder and dysphagia. These impairments are limiting patient from safety when swallowing. Factors affecting potential to achieve goals and functional outcome are previous level of function. Patient will benefit from skilled SLP services to address above impairments and improve overall function.  REHAB POTENTIAL: Good   GOALS: Goals reviewed with patient? Yes  SHORT TERM GOALS: Target date: 06/16/2023  Pt will participate in MBSS Baseline: Goal status: IN PROGRESS  2.  Pt will teach back and demonstrate use of swallow strategies and targeted swallow exercises with 100% accuracy, with use of visual aid PRN Baseline:  Goal status: IN PROGRESS  3.  Pt will demonstrate completion of x4 potential aphasia HEP activities with mod-A from SLP  Baseline:  Goal status: IN PROGRESS  4.  Pt will report HEP for dysphagia/aphasia completion 5/7 days over 1 week period Baseline:  Goal status: IN PROGRESS   LONG TERM GOALS: Target date: 08/11/2023  Pt will report successful completion of HEP 5/7 days over 2 week period with support from daughter Baseline:  Goal status: IN PROGRESS  2.  Pt will complete structured language tasks  with 80% accuracy given occasional mod-A Baseline:  Goal status: IN PROGRESS  3.  Pt will report improvement via EAT-10 by dc Baseline:  Goal status: IN PROGRESS  4.  Pt will complete f/u MBSS, if indicated  Baseline:  Goal status: IN PROGRESS  PLAN:  SLP FREQUENCY: 2x/week  SLP DURATION: 12 weeks  PLANNED INTERVENTIONS: Aspiration precaution training, Pharyngeal strengthening exercises, Diet toleration management , Language facilitation, Trials of upgraded texture/liquids, Cueing hierachy, Functional tasks, Multimodal communication approach, SLP instruction and feedback, Compensatory strategies, Patient/family education, and Re-evaluation  [place], Student-SLP 06/21/2023, 10:11 AM

## 2023-06-24 ENCOUNTER — Ambulatory Visit: Payer: Medicare Other | Admitting: Speech Pathology

## 2023-06-24 DIAGNOSIS — R4701 Aphasia: Secondary | ICD-10-CM | POA: Diagnosis not present

## 2023-06-24 DIAGNOSIS — R471 Dysarthria and anarthria: Secondary | ICD-10-CM | POA: Diagnosis not present

## 2023-06-24 DIAGNOSIS — R1310 Dysphagia, unspecified: Secondary | ICD-10-CM

## 2023-06-24 NOTE — Therapy (Signed)
OUTPATIENT SPEECH LANGUAGE PATHOLOGY TREATMENT   Patient Name: Todd Armstrong MRN: [id number] DOB:28-Feb-1931, 89 y.o., male Today's Date: 06/24/2023  PCP: Emilio Aspen, MD REFERRING PROVIDER: Georgann Housekeeper, MD  END OF SESSION:  End of Session - 06/24/23 1018     Visit Number 7    Number of Visits 25    Date for SLP Re-Evaluation 08/11/23    Progress Note Due on Visit 10    SLP Start Time 1019    SLP Stop Time  1104    SLP Time Calculation (min) 45 min    Activity Tolerance Patient tolerated treatment well             Past Medical History:  Diagnosis Date   Acute myocardial infarction    Anemia    Aortic insufficiency    Carotid stenosis    Chronic combined systolic and diastolic CHF (congestive heart failure) (HCC)    Coronary artery disease    PTCA and stenting of his right coronary artery. 3.5 x 16-mm Liberte stent.               Diabetes mellitus without complication (HCC)    Dyslipidemia    Emphysema (subcutaneous) (surgical) resulting from a procedure    Hyperlipemia    Hypertension    Mitral regurgitation    Partial tear of rotator cuff    right shoulder   Peripheral vascular disease (HCC)    Persistent atrial fibrillation (HCC)    Pulmonary hypertension (HCC)    Stroke [facility])    Wears dentures    Past Surgical History:  Procedure Laterality Date   CARDIAC CATHETERIZATION  11/05/2008   CARDIAC CATHETERIZATION  10/22/1993   EF 60%   CARDIOVERSION N/A 06/05/2020   Procedure: CARDIOVERSION;  Surgeon: Vesta Mixer, MD;  Location: [facility] ENDOSCOPY;  Service: Cardiovascular;  Laterality: N/A;   CAROTID ENDARTERECTOMY Right 03/23/2018   CORONARY ANGIOPLASTY WITH STENT PLACEMENT     ENDARTERECTOMY Right 03/23/2018   Procedure: RIGHT CAROTID ARTERY ENDARTERECTOMY;  Surgeon: Maeola Harman, MD;  Location: [facility] OR;  Service: Vascular;  Laterality: Right;   FRACTURE SURGERY     righ fibula and tibia   HERNIA REPAIR  1984   KNEE ARTHROSCOPY  Left    MULTIPLE TOOTH EXTRACTIONS     PATCH ANGIOPLASTY Right 03/23/2018   Procedure: WITH Livia Snellen 1CM X 6CM PATCH ANGIOPLASTY;  Surgeon: Maeola Harman, MD;  Location: [facility] OR;  Service: Vascular;  Laterality: Right;   SHOULDER ARTHROSCOPY Right 06/09/2017   Procedure: ARTHROSCOPY SHOULDER SUBACROMIAL DECOMPRESSION AND ACROMIOPLASTY;  Surgeon: Marcene Corning, MD;  Location: [facility] OR;  Service: Orthopedics;  Laterality: Right;  GENERAL ANESTHESIA WITH BLOCK   Patient Active Problem List   Diagnosis Date Noted   Atrial fibrillation, persistent (HCC) 02/17/2023   CKD (chronic kidney disease) stage 3, GFR 30-59 ml/min (HCC) 02/17/2023   CHF exacerbation (HCC) 09/27/2021   Emphysema/COPD (HCC) 09/27/2021   Type 2 diabetes mellitus with hyperlipidemia (HCC) 09/27/2021   Pulmonary HTN (HCC) 06/03/2021   Persistent atrial fibrillation (HCC)    Carotid stenosis, asymptomatic, right 03/23/2018   Bilateral carotid bruits 10/15/2016   Acute myocardial infarction    Dyslipidemia    Coronary artery disease    Hypertension    Hyperlipemia     ONSET DATE: 04/27/23   REFERRING DIAG:  R13.10 (ICD-10-CM) - Dysphagia, unspecified   THERAPY DIAG:  Dysphagia, unspecified type  Rationale for Evaluation and Treatment: Rehabilitation  SUBJECTIVE:   SUBJECTIVE STATEMENT: "Practiced  the words" Pt accompanied by: family member  PERTINENT HISTORY:   Patient complains of Dysphagia patient is awaiting the speech evaluation scheduled according to the daughter next week. He had had pneumonia probably aspiration pneumonia he is getting frail and weakness probably having swallowing muscle weakness.  Difficulty in coughing even with liquids and solid as per the daughter.          His other issue is constant nasal drip and drainage specially worse with eating more rhinitis that also is making things worse.   PAIN:  Are you having pain? No  FALLS: Has patient fallen in last 6 months?  Yes  LIVING  ENVIRONMENT: Lives with: lives with their family and lives with their daughter Lives in: House/apartment  PLOF:  Level of assistance: Independent with ADLs, Independent with IADLs Employment: Retired  PATIENT GOALS: "get Engineer, manufacturing systems.   OBJECTIVE:   DIAGNOSTIC FINDINGS:  MBSS 05/31/2023: Clinical Impression: Clinical Impression: Pt demonstrates moderate oropharyngeal dysphagia with primary impairment of delayed swallow initaition. Pt has silent trace aspiration of thin liquids before the swallow with a large straw sip and sensed aspiration of larger quantity with partial ejection before the swallow with a large sip take to transit a pill. There were also trace aspiration events with single straw sips with a chin tuck. Best methods to prevent aspiration are small, controlled single sips. Breath hold/supraglottic swallow also effective with small/medium sized sips, though this would need a great deal of practice. Otherwise, oral movement a little slow but strong and complete and pharyngeal strength good with only a trace quantity of residue. Recommend pt continue a regular diet and thin liquids, small single sips from a cup, pills whole in puree. Oral hygiene important. Nectar thick liquids were tolerated very well if needed, but hopeful that increased awareness and training with SLP can help pt better tolerate thin liquids.   05/28/2022 ENT Flexible laryngoscopy  "After adequate topical anesthetic was applied, 2 mm flexible laryngoscope was passed through the nasal cavity without difficulty. Flexible laryngoscopy shows patent anterior nasal cavity with minimal crusting, no discharge or infection.  Normal base of tongue and supraglottis Normal vocal cord mobility with slight bowing of true vocal folds, slight suggestion of bilateral nodule formation at the posterior one third of each cord. The hypopharynx normal without mass, pooling of secretions or aspiration.  Patient tolerated  procedure without complication or difficulty.  Kermit Balo. [place]hour, PA-C  Impression & Plans:   1) hoarseness 2) presbylaryngis"  COGNITION: Overall cognitive status: Within functional limits for tasks assessed Functional deficits: pt and daughter endorse no cognitive deficits   ORAL MOTOR EXAMINATION: Overall status: WFL Comments: Horse, breathy, and weak voice. Patient demonstrated with slight left side deviation observed during jaw opening.   CLINICAL SWALLOW ASSESSMENT:   Current diet: regular Dentition: Dentures top/bottom Patient directly observed with POs: Yes: thin liquids  Feeding: able to feed self Liquids provided by: cup Oral phase signs and symptoms:  No signs Pharyngeal phase signs and symptoms: immediate cough, perceptually appears weak; reddening face; shortness of breath  Patient report: ongoing coughing with thin liquids with usual coughing and sensation of liquid "going the wrong way." Endorses shortness of breath, denies odynophagia. Occasional cough with solids, sensation of "sticking" though not as frequently occurring as coughing with liquid. Recent hx of PNA. Began ~6 months ago, co-occurring with acute challenges with word finding which appears c/w aphasia.   AUDITORY COMPREHENSION: Overall auditory comprehension: Appears intact YES/NO questions: Appears intact Following directions:  Appears intact Conversation: Moderately Complex  READING COMPREHENSION: Intact  EXPRESSION: verbal  VERBAL EXPRESSION: Level of generative/spontaneous verbalization: conversation, though reduced MLU  Automatic speech: counting: intact  Repetition: Impaired: Word Naming: Confrontation:  54% Pragmatics: Appears intact Discourse: reduced MLU, reduced rate, mixed paraphasias (primarily phonemic), agrammatism, anomia; no evidence of apraxia Effective technique: none trialed d/t time constraints  Non-verbal means of communication: none evidenced  STANDARDIZED  ASSESSMENTS: QAB: Moderate (6.04)   PATIENT REPORTED OUTCOME MEASURES (PROM): EAT-10: 36/40    TODAY'S TREATMENT:                                                                                                                                         DATE:   06/23/23: Targeted dysarthria through tri-syllabic words patient with ~20% intelligibility on initial attempt. Patient required frequent max-A benefitting from syllabification (oral and written), immediate feedback, choral production. Patient was able to increase his ineligibility to ~40%. Patient struggles when transitioning sounds from back to front. Sounds /k/ presented the most struggle, as well as high vowels. Led through structured conversational sentences when provided with visual models, and breaking words up into smaller parts. Patient was able to increase intelligibility with multiple practice and models and reminders to slow down and take his time. Education provided on compensations to facilitative improved communication efficacy in conversation: having listening partners pay attention and "listen close" to patient while he is speaking, and having them repeat parts they heard so that patient only has to repeat the unclear words. At the end of the session, patient stated that food keeps getting stuck in his throat, and is getting worse. Encouraged to continue swallow exercises at home. Next session will add EMST.   06/21/23: Patient states that HEP was implemented of 10 effortful swallows. Patient read through conversational phrases with 40% intelligibility, with reminders to take breaths and take time. Daughter states that speech clarity has gotten worse and she struggles to understand him now. SLP led through SOVTE to warm up voice. Patient struggled with higher pitches, with and without the straw and stated that it took more effort. Patient able to increase tone through straw with max-A of models, specific and immediate feedback. Education  on how voice and swallow are related, as well as how looking at someone who is speaking to increase clarity. HEP given of SOVTE daily to warm up voice, reading through tri-syllabic words, as well as reading Bible out loud to increase clarity. All questions answered to patient satisfaction. Next session will focus on tri-syllabic words.   06/03/23: ST targeting dysphagia and spoken expression (word finding and speech intelligibility)  92526: Reviewed how HEP went at home, pt stated that he was able to complete 3 sets of 10 of the hard swallows and states that exercise was easy for him. SLP led through 20 effortful swallows during today's session.  11914: SLP targeted articulation and intelligibility on  the word level by having patient name items within a category, and rating intelligibility scale 1-3. Patient's rating of intelligibly matched the SLP's 100% of the time. Patient required min-mod-A during today's session consisting of models, and reminders to slow down. Clarity was better at the single word level, and decreased with phrases. Led through functional phrases, patient able to increase intelligibility after prompts to check in on his speech. Patient improved throughout the session on taking time and breaking up his speech sounds. HEP given to continue effortful swallows of 3x for 20 times. Education given on tongue movement in regard to producing the /h/ and /k/ sound.   06/01/23: ST discussed results from MBSS with patient and daughter, all questions answered to patient satisfaction. Education on effortful swallow with modeling, patient unable to swallow with a dry mouth, recommended 5 mL water. Patient required frequent mod-A with models and reminders to breathe. Patient sent home with HEP of 20 effortful swallows 2-3x day. Targeted word finding using Response Elaboration Training, through picture description task. Patient was able to answer questions related to the topic, and relate it to personal  topics with min-A. Speech intelligibility continues to be decreased.    05/27/23: Targeted word finding through SFA. Patient required frequent min-A to find words related to topic. He struggled the most with finding the category what the item reminded him of. Patient stated that he enjoyed this intervention more then VNeST, but that both were the same in difficulty. Clarify of voice remains the same throughout the session due to vocal strain. MBSS scheduled for 6/18, and SLP will review results next session. All questions answered to patient satisfaction.  05/24/23: Targeted word finding, verbal apraxia, sentence generation with Scientist, product/process development (VNeST). For the verbs watch, play, cook, read, the patient generated 3 subject and objects for each verb, a total of three subjects and objects with mod-A verbal, questioning, written cues required. Pt generated complex sentences, 1 for each verb, by answering "wh" questions with mod-A cues, for a total of 12 complex sentences. Daughter was able to aid in some clarifications during times of poor intelligibility due to increase in background knowledge.     05/19/23: Evaluation completed. MBSS explained and all questions answered. Reviewed POC and goals with pt and daughter verbalizing agreement.   PATIENT EDUCATION: Education details: See above Person educated: Patient and Child(ren) Education method: Explanation Education comprehension: verbalized understanding   ASSESSMENT:  CLINICAL IMPRESSION: Patient is a 89 y.o. M who was seen today for dysphagia evaluation. Clinical swallow evaluation significant for s/sx of pharyngeal dysphagia with thin liquid trial. Patient presented with immediate cough after thin liquid trial with subsequent shortness of breath. MBSS recommended, with pt and daughter endorsing agreement. Patient presented with hoarse and strained voice, and stated that he often runs out of air when eating and speaking. Denied  change is voice presentation though reduced intelligibility resulted from vocal strain. QAB indicates moderate aphasia. Pt's receptive language appears grossly intact, expressive language is c/b slow, halting speech with agrammatism, anomia, and paraphasias. Discourse cohesion negatively impacted, communication partner required to A pt in expressing desired message. Pt's and daughter endorse this is new, co-occurring with swallow changes which began 6 months ago without clear etiology. Chart review w/o brain imaging so unable to determine if organic etiology may be impacting these changes. PCP may wish to consider brain imaging to further evaluate the cause of these new challenges. Swallowing challenges could potentially be related to hx of voice changes, endorsed  by ENT visit 05/2022, however does not explain new challenges with word finding. Pt would benefit from ST to address dysphagia and aphasia to maximize independence, increase safety during the swallow, and improve communication efficacy.    OBJECTIVE IMPAIRMENTS: include voice disorder and dysphagia. These impairments are limiting patient from safety when swallowing. Factors affecting potential to achieve goals and functional outcome are previous level of function. Patient will benefit from skilled SLP services to address above impairments and improve overall function.  REHAB POTENTIAL: Good   GOALS: Goals reviewed with patient? Yes  SHORT TERM GOALS: Target date: 06/16/2023  Pt will participate in MBSS Baseline: Goal status: MET  2.  Pt will teach back and demonstrate use of swallow strategies and targeted swallow exercises with 100% accuracy, with use of visual aid PRN Baseline:  Goal status: MET  3.  Pt will demonstrate completion of x4 potential aphasia HEP activities with mod-A from SLP  Baseline:  Goal status: MET  4.  Pt will report HEP for dysphagia/aphasia completion 5/7 days over 1 week period Baseline:  Goal status: NOT  MET   LONG TERM GOALS: Target date: 08/11/2023  Pt will report successful completion of HEP 5/7 days over 2 week period with support from daughter Baseline:  Goal status: IN PROGRESS  2.  Pt will complete structured language tasks with 80% accuracy given occasional mod-A Baseline:  Goal status: IN PROGRESS  3.  Pt will report improvement via EAT-10 by dc Baseline:  Goal status: IN PROGRESS  4.  Pt will complete f/u MBSS, if indicated  Baseline:  Goal status: IN PROGRESS  PLAN:  SLP FREQUENCY: 2x/week  SLP DURATION: 12 weeks  PLANNED INTERVENTIONS: Aspiration precaution training, Pharyngeal strengthening exercises, Diet toleration management , Language facilitation, Trials of upgraded texture/liquids, Cueing hierachy, Functional tasks, Multimodal communication approach, SLP instruction and feedback, Compensatory strategies, Patient/family education, and Re-evaluation  [place], Student-SLP 06/24/2023, 10:19 AM

## 2023-06-28 ENCOUNTER — Ambulatory Visit: Payer: Medicare Other | Admitting: Speech Pathology

## 2023-06-28 DIAGNOSIS — R471 Dysarthria and anarthria: Secondary | ICD-10-CM | POA: Diagnosis not present

## 2023-06-28 DIAGNOSIS — R4701 Aphasia: Secondary | ICD-10-CM | POA: Diagnosis not present

## 2023-06-28 DIAGNOSIS — R1310 Dysphagia, unspecified: Secondary | ICD-10-CM | POA: Diagnosis not present

## 2023-06-28 NOTE — Patient Instructions (Addendum)
Hi Kenny.  What did you watched on T.V.?   Hi Kristy.   Lives in the [place].   Hello Victorino Dike.   Victorino Dike is my granddaughter.   How was work today [employer]?   Let's go out for [place].  Wholly Guacamole. I want a beer.   I want to go to [employer].   We are going to [facility].   I want to go to [facility].   I want the fried flounder.   I saw a cardinal on the front porch.   The bird bath needs water.   I saw a hummingbird.   I like to chase the squirrels.

## 2023-06-28 NOTE — Therapy (Signed)
OUTPATIENT SPEECH LANGUAGE PATHOLOGY TREATMENT   Patient Name: Todd Armstrong MRN: [id number] DOB:31-May-1931, 89 y.o., male Today's Date: 06/28/2023  PCP: Emilio Aspen, MD REFERRING PROVIDER: Georgann Housekeeper, MD  END OF SESSION:    Past Medical History:  Diagnosis Date   Acute myocardial infarction    Anemia    Aortic insufficiency    Carotid stenosis    Chronic combined systolic and diastolic CHF (congestive heart failure) (HCC)    Coronary artery disease    PTCA and stenting of his right coronary artery. 3.5 x 16-mm Liberte stent.               Diabetes mellitus without complication (HCC)    Dyslipidemia    Emphysema (subcutaneous) (surgical) resulting from a procedure    Hyperlipemia    Hypertension    Mitral regurgitation    Partial tear of rotator cuff    right shoulder   Peripheral vascular disease (HCC)    Persistent atrial fibrillation (HCC)    Pulmonary hypertension (HCC)    Stroke [facility])    Wears dentures    Past Surgical History:  Procedure Laterality Date   CARDIAC CATHETERIZATION  11/05/2008   CARDIAC CATHETERIZATION  10/22/1993   EF 60%   CARDIOVERSION N/A 06/05/2020   Procedure: CARDIOVERSION;  Surgeon: Vesta Mixer, MD;  Location: [facility] ENDOSCOPY;  Service: Cardiovascular;  Laterality: N/A;   CAROTID ENDARTERECTOMY Right 03/23/2018   CORONARY ANGIOPLASTY WITH STENT PLACEMENT     ENDARTERECTOMY Right 03/23/2018   Procedure: RIGHT CAROTID ARTERY ENDARTERECTOMY;  Surgeon: Maeola Harman, MD;  Location: [facility] OR;  Service: Vascular;  Laterality: Right;   FRACTURE SURGERY     righ fibula and tibia   HERNIA REPAIR  1984   KNEE ARTHROSCOPY Left    MULTIPLE TOOTH EXTRACTIONS     PATCH ANGIOPLASTY Right 03/23/2018   Procedure: WITH Livia Snellen 1CM X 6CM PATCH ANGIOPLASTY;  Surgeon: Maeola Harman, MD;  Location: [facility] OR;  Service: Vascular;  Laterality: Right;   SHOULDER ARTHROSCOPY Right 06/09/2017   Procedure: ARTHROSCOPY SHOULDER  SUBACROMIAL DECOMPRESSION AND ACROMIOPLASTY;  Surgeon: Marcene Corning, MD;  Location: [facility] OR;  Service: Orthopedics;  Laterality: Right;  GENERAL ANESTHESIA WITH BLOCK   Patient Active Problem List   Diagnosis Date Noted   Atrial fibrillation, persistent (HCC) 02/17/2023   CKD (chronic kidney disease) stage 3, GFR 30-59 ml/min (HCC) 02/17/2023   CHF exacerbation (HCC) 09/27/2021   Emphysema/COPD (HCC) 09/27/2021   Type 2 diabetes mellitus with hyperlipidemia (HCC) 09/27/2021   Pulmonary HTN (HCC) 06/03/2021   Persistent atrial fibrillation (HCC)    Carotid stenosis, asymptomatic, right 03/23/2018   Bilateral carotid bruits 10/15/2016   Acute myocardial infarction    Dyslipidemia    Coronary artery disease    Hypertension    Hyperlipemia     ONSET DATE: 04/27/23   REFERRING DIAG:  R13.10 (ICD-10-CM) - Dysphagia, unspecified   THERAPY DIAG:  No diagnosis found.  Rationale for Evaluation and Treatment: Rehabilitation  SUBJECTIVE:   SUBJECTIVE STATEMENT: "Low breath"--answer to why we are working on swallows.  Pt accompanied by: family member  PERTINENT HISTORY:   Patient complains of Dysphagia patient is awaiting the speech evaluation scheduled according to the daughter next week. He had had pneumonia probably aspiration pneumonia he is getting frail and weakness probably having swallowing muscle weakness.  Difficulty in coughing even with liquids and solid as per the daughter.          His other issue is  constant nasal drip and drainage specially worse with eating more rhinitis that also is making things worse.   PAIN:  Are you having pain? No  FALLS: Has patient fallen in last 6 months?  Yes  LIVING ENVIRONMENT: Lives with: lives with their family and lives with their daughter Lives in: House/apartment  PLOF:  Level of assistance: Independent with ADLs, Independent with IADLs Employment: Retired  PATIENT GOALS: "get Engineer, manufacturing systems.   OBJECTIVE:    DIAGNOSTIC FINDINGS:  MBSS 05/31/2023: Clinical Impression: Clinical Impression: Pt demonstrates moderate oropharyngeal dysphagia with primary impairment of delayed swallow initaition. Pt has silent trace aspiration of thin liquids before the swallow with a large straw sip and sensed aspiration of larger quantity with partial ejection before the swallow with a large sip take to transit a pill. There were also trace aspiration events with single straw sips with a chin tuck. Best methods to prevent aspiration are small, controlled single sips. Breath hold/supraglottic swallow also effective with small/medium sized sips, though this would need a great deal of practice. Otherwise, oral movement a little slow but strong and complete and pharyngeal strength good with only a trace quantity of residue. Recommend pt continue a regular diet and thin liquids, small single sips from a cup, pills whole in puree. Oral hygiene important. Nectar thick liquids were tolerated very well if needed, but hopeful that increased awareness and training with SLP can help pt better tolerate thin liquids.   05/28/2022 ENT Flexible laryngoscopy  "After adequate topical anesthetic was applied, 2 mm flexible laryngoscope was passed through the nasal cavity without difficulty. Flexible laryngoscopy shows patent anterior nasal cavity with minimal crusting, no discharge or infection.  Normal base of tongue and supraglottis Normal vocal cord mobility with slight bowing of true vocal folds, slight suggestion of bilateral nodule formation at the posterior one third of each cord. The hypopharynx normal without mass, pooling of secretions or aspiration.  Patient tolerated procedure without complication or difficulty.  Kermit Balo. [place]hour, PA-C  Impression & Plans:   1) hoarseness 2) presbylaryngis"  COGNITION: Overall cognitive status: Within functional limits for tasks assessed Functional deficits: pt and daughter endorse no  cognitive deficits   ORAL MOTOR EXAMINATION: Overall status: WFL Comments: Horse, breathy, and weak voice. Patient demonstrated with slight left side deviation observed during jaw opening.   CLINICAL SWALLOW ASSESSMENT:   Current diet: regular Dentition: Dentures top/bottom Patient directly observed with POs: Yes: thin liquids  Feeding: able to feed self Liquids provided by: cup Oral phase signs and symptoms:  No signs Pharyngeal phase signs and symptoms: immediate cough, perceptually appears weak; reddening face; shortness of breath  Patient report: ongoing coughing with thin liquids with usual coughing and sensation of liquid "going the wrong way." Endorses shortness of breath, denies odynophagia. Occasional cough with solids, sensation of "sticking" though not as frequently occurring as coughing with liquid. Recent hx of PNA. Began ~6 months ago, co-occurring with acute challenges with word finding which appears c/w aphasia.   AUDITORY COMPREHENSION: Overall auditory comprehension: Appears intact YES/NO questions: Appears intact Following directions: Appears intact Conversation: Moderately Complex  READING COMPREHENSION: Intact  EXPRESSION: verbal  VERBAL EXPRESSION: Level of generative/spontaneous verbalization: conversation, though reduced MLU  Automatic speech: counting: intact  Repetition: Impaired: Word Naming: Confrontation:  54% Pragmatics: Appears intact Discourse: reduced MLU, reduced rate, mixed paraphasias (primarily phonemic), agrammatism, anomia; no evidence of apraxia Effective technique: none trialed d/t time constraints  Non-verbal means of communication: none evidenced  STANDARDIZED ASSESSMENTS: QAB: Moderate (  6.04)   PATIENT REPORTED OUTCOME MEASURES (PROM): EAT-10: 36/40    TODAY'S TREATMENT:                                                                                                                                         DATE:   06/28/23:  Education on purpose of swallowing therapy and why it is important. Patient cleared throat x3 during today's session. Targeted cough efficiency through EMST. Education provided on SLP conducted assessment of MEP for EMST. Pt's maximum expiratory pressure today was 93 cm H2O. EMST 150 device was started at 75% MEP at 65 cm H2O. SLP provided demonstration and verbal instruction to utilize EMST device. Pt completed 3 repetitions over 3 sets during our session. HEP initiated for 5 sets of 5 reps for 5 days/week. HEP previously completed of hard swallows 6 times twice a day, encouraged to increase to 15 hard swallows. Check list given to help keep track. HEP given to implement EMST 5 sets of 5. All questions answered to patient satisfaction. Targeted dysarthria through 3-4 syllable words. Patient's intelligibility ~20% and required max-A of immediate feedback, models, and syllable breaks. Transitioned to functional conversational phrases. Created a functional list of conversational topics for those in his life. HEP given of functional phrase list.   06/23/23: Targeted dysarthria through tri-syllabic words patient with ~20% intelligibility on initial attempt. Patient required frequent max-A benefitting from syllabification (oral and written), immediate feedback, choral production. Patient was able to increase his ineligibility to ~40%. Patient struggles when transitioning sounds from back to front. Sounds /k/ presented the most struggle, as well as high vowels. Led through structured conversational sentences when provided with visual models, and breaking words up into smaller parts. Patient was able to increase intelligibility with multiple practice and models and reminders to slow down and take his time. Education provided on compensations to facilitative improved communication efficacy in conversation: having listening partners pay attention and "listen close" to patient while he is speaking, and having them repeat  parts they heard so that patient only has to repeat the unclear words. At the end of the session, patient stated that food keeps getting stuck in his throat, and is getting worse. Encouraged to continue swallow exercises at home. Next session will add EMST.   06/21/23: Patient states that HEP was implemented of 10 effortful swallows. Patient read through conversational phrases with 40% intelligibility, with reminders to take breaths and take time. Daughter states that speech clarity has gotten worse and she struggles to understand him now. SLP led through SOVTE to warm up voice. Patient struggled with higher pitches, with and without the straw and stated that it took more effort. Patient able to increase tone through straw with max-A of models, specific and immediate feedback. Education on how voice and swallow are related, as well as how looking at someone who is speaking to increase clarity. HEP given  of SOVTE daily to warm up voice, reading through tri-syllabic words, as well as reading Bible out loud to increase clarity. All questions answered to patient satisfaction. Next session will focus on tri-syllabic words.   06/03/23: ST targeting dysphagia and spoken expression (word finding and speech intelligibility)  92526: Reviewed how HEP went at home, pt stated that he was able to complete 3 sets of 10 of the hard swallows and states that exercise was easy for him. SLP led through 20 effortful swallows during today's session.  16109: SLP targeted articulation and intelligibility on the word level by having patient name items within a category, and rating intelligibility scale 1-3. Patient's rating of intelligibly matched the SLP's 100% of the time. Patient required min-mod-A during today's session consisting of models, and reminders to slow down. Clarity was better at the single word level, and decreased with phrases. Led through functional phrases, patient able to increase intelligibility after prompts to  check in on his speech. Patient improved throughout the session on taking time and breaking up his speech sounds. HEP given to continue effortful swallows of 3x for 20 times. Education given on tongue movement in regard to producing the /h/ and /k/ sound.   06/01/23: ST discussed results from MBSS with patient and daughter, all questions answered to patient satisfaction. Education on effortful swallow with modeling, patient unable to swallow with a dry mouth, recommended 5 mL water. Patient required frequent mod-A with models and reminders to breathe. Patient sent home with HEP of 20 effortful swallows 2-3x day. Targeted word finding using Response Elaboration Training, through picture description task. Patient was able to answer questions related to the topic, and relate it to personal topics with min-A. Speech intelligibility continues to be decreased.    05/27/23: Targeted word finding through SFA. Patient required frequent min-A to find words related to topic. He struggled the most with finding the category what the item reminded him of. Patient stated that he enjoyed this intervention more then VNeST, but that both were the same in difficulty. Clarify of voice remains the same throughout the session due to vocal strain. MBSS scheduled for 6/18, and SLP will review results next session. All questions answered to patient satisfaction.  05/24/23: Targeted word finding, verbal apraxia, sentence generation with Scientist, product/process development (VNeST). For the verbs watch, play, cook, read, the patient generated 3 subject and objects for each verb, a total of three subjects and objects with mod-A verbal, questioning, written cues required. Pt generated complex sentences, 1 for each verb, by answering "wh" questions with mod-A cues, for a total of 12 complex sentences. Daughter was able to aid in some clarifications during times of poor intelligibility due to increase in background knowledge.     05/19/23:  Evaluation completed. MBSS explained and all questions answered. Reviewed POC and goals with pt and daughter verbalizing agreement.   PATIENT EDUCATION: Education details: See above Person educated: Patient and Child(ren) Education method: Explanation Education comprehension: verbalized understanding   ASSESSMENT:  CLINICAL IMPRESSION: Patient is a 89 y.o. M who was seen today for dysphagia evaluation. Clinical swallow evaluation significant for s/sx of pharyngeal dysphagia with thin liquid trial. Patient presented with immediate cough after thin liquid trial with subsequent shortness of breath. MBSS recommended, with pt and daughter endorsing agreement. Patient presented with hoarse and strained voice, and stated that he often runs out of air when eating and speaking. Denied change is voice presentation though reduced intelligibility resulted from vocal strain. QAB indicates moderate  aphasia. Pt's receptive language appears grossly intact, expressive language is c/b slow, halting speech with agrammatism, anomia, and paraphasias. Discourse cohesion negatively impacted, communication partner required to A pt in expressing desired message. Pt's and daughter endorse this is new, co-occurring with swallow changes which began 6 months ago without clear etiology. Chart review w/o brain imaging so unable to determine if organic etiology may be impacting these changes. PCP may wish to consider brain imaging to further evaluate the cause of these new challenges. Swallowing challenges could potentially be related to hx of voice changes, endorsed by ENT visit 05/2022, however does not explain new challenges with word finding. Pt would benefit from ST to address dysphagia and aphasia to maximize independence, increase safety during the swallow, and improve communication efficacy.    OBJECTIVE IMPAIRMENTS: include voice disorder and dysphagia. These impairments are limiting patient from safety when  swallowing. Factors affecting potential to achieve goals and functional outcome are previous level of function. Patient will benefit from skilled SLP services to address above impairments and improve overall function.  REHAB POTENTIAL: Good   GOALS: Goals reviewed with patient? Yes  SHORT TERM GOALS: Target date: 06/16/2023  Pt will participate in MBSS Baseline: Goal status: MET  2.  Pt will teach back and demonstrate use of swallow strategies and targeted swallow exercises with 100% accuracy, with use of visual aid PRN Baseline:  Goal status: MET  3.  Pt will demonstrate completion of x4 potential aphasia HEP activities with mod-A from SLP  Baseline:  Goal status: MET  4.  Pt will report HEP for dysphagia/aphasia completion 5/7 days over 1 week period Baseline:  Goal status: NOT MET   LONG TERM GOALS: Target date: 08/11/2023  Pt will report successful completion of HEP 5/7 days over 2 week period with support from daughter Baseline:  Goal status: IN PROGRESS  2.  Pt will complete structured language tasks with 80% accuracy given occasional mod-A Baseline:  Goal status: IN PROGRESS  3.  Pt will report improvement via EAT-10 by dc Baseline:  Goal status: IN PROGRESS  4.  Pt will complete f/u MBSS, if indicated  Baseline:  Goal status: IN PROGRESS  PLAN:  SLP FREQUENCY: 2x/week  SLP DURATION: 12 weeks  PLANNED INTERVENTIONS: Aspiration precaution training, Pharyngeal strengthening exercises, Diet toleration management , Language facilitation, Trials of upgraded texture/liquids, Cueing hierachy, Functional tasks, Multimodal communication approach, SLP instruction and feedback, Compensatory strategies, Patient/family education, and Re-evaluation  [place], Student-SLP 06/28/2023, 8:55 AM

## 2023-07-01 ENCOUNTER — Ambulatory Visit: Payer: Medicare Other | Admitting: Speech Pathology

## 2023-07-01 DIAGNOSIS — R471 Dysarthria and anarthria: Secondary | ICD-10-CM | POA: Diagnosis not present

## 2023-07-01 DIAGNOSIS — R1310 Dysphagia, unspecified: Secondary | ICD-10-CM

## 2023-07-01 DIAGNOSIS — R4701 Aphasia: Secondary | ICD-10-CM | POA: Diagnosis not present

## 2023-07-01 NOTE — Patient Instructions (Signed)
Strategies to repair communication breakdowns.   Give context clues:  Ie. "[place]. Restaurant"--for [employer]  Tell them what it starts with.  Spell it.  Use the menu to show what you want.

## 2023-07-01 NOTE — Therapy (Signed)
OUTPATIENT SPEECH LANGUAGE PATHOLOGY TREATMENT   Patient Name: Todd Armstrong MRN: [id number] DOB:[birth year], 89 y.o., male Today's Date: 07/01/2023  PCP: Emilio Aspen, MD REFERRING PROVIDER: Georgann Housekeeper, MD  END OF SESSION:  End of Session - 07/01/23 0942     Visit Number 9    Number of Visits 25    Date for SLP Re-Evaluation 08/11/23    Progress Note Due on Visit 10    SLP Start Time [phone]    SLP Stop Time  1027    SLP Time Calculation (min) 45 min              Past Medical History:  Diagnosis Date   Acute myocardial infarction    Anemia    Aortic insufficiency    Carotid stenosis    Chronic combined systolic and diastolic CHF (congestive heart failure) (HCC)    Coronary artery disease    PTCA and stenting of his right coronary artery. 3.5 x 16-mm Liberte stent.               Diabetes mellitus without complication (HCC)    Dyslipidemia    Emphysema (subcutaneous) (surgical) resulting from a procedure    Hyperlipemia    Hypertension    Mitral regurgitation    Partial tear of rotator cuff    right shoulder   Peripheral vascular disease (HCC)    Persistent atrial fibrillation (HCC)    Pulmonary hypertension (HCC)    Stroke [facility])    Wears dentures    Past Surgical History:  Procedure Laterality Date   CARDIAC CATHETERIZATION  11/05/2008   CARDIAC CATHETERIZATION  10/22/1993   EF 60%   CARDIOVERSION N/A 06/05/2020   Procedure: CARDIOVERSION;  Surgeon: Vesta Mixer, MD;  Location: [facility] ENDOSCOPY;  Service: Cardiovascular;  Laterality: N/A;   CAROTID ENDARTERECTOMY Right 03/23/2018   CORONARY ANGIOPLASTY WITH STENT PLACEMENT     ENDARTERECTOMY Right 03/23/2018   Procedure: RIGHT CAROTID ARTERY ENDARTERECTOMY;  Surgeon: Maeola Harman, MD;  Location: [facility] OR;  Service: Vascular;  Laterality: Right;   FRACTURE SURGERY     righ fibula and tibia   HERNIA REPAIR  1984   KNEE ARTHROSCOPY Left    MULTIPLE TOOTH EXTRACTIONS     PATCH  ANGIOPLASTY Right 03/23/2018   Procedure: WITH Livia Snellen 1CM X 6CM PATCH ANGIOPLASTY;  Surgeon: Maeola Harman, MD;  Location: [facility] OR;  Service: Vascular;  Laterality: Right;   SHOULDER ARTHROSCOPY Right 06/09/2017   Procedure: ARTHROSCOPY SHOULDER SUBACROMIAL DECOMPRESSION AND ACROMIOPLASTY;  Surgeon: Marcene Corning, MD;  Location: [facility] OR;  Service: Orthopedics;  Laterality: Right;  GENERAL ANESTHESIA WITH BLOCK   Patient Active Problem List   Diagnosis Date Noted   Atrial fibrillation, persistent (HCC) 02/17/2023   CKD (chronic kidney disease) stage 3, GFR 30-59 ml/min (HCC) 02/17/2023   CHF exacerbation (HCC) 09/27/2021   Emphysema/COPD (HCC) 09/27/2021   Type 2 diabetes mellitus with hyperlipidemia (HCC) 09/27/2021   Pulmonary HTN (HCC) 06/03/2021   Persistent atrial fibrillation (HCC)    Carotid stenosis, asymptomatic, right 03/23/2018   Bilateral carotid bruits 10/15/2016   Acute myocardial infarction    Dyslipidemia    Coronary artery disease    Hypertension    Hyperlipemia     ONSET DATE: 04/27/23   REFERRING DIAG:  R13.10 (ICD-10-CM) - Dysphagia, unspecified   THERAPY DIAG:  Dysphagia, unspecified type  Dysarthria and anarthria  Rationale for Evaluation and Treatment: Rehabilitation  SUBJECTIVE:   SUBJECTIVE STATEMENT:  Patient came  Pt  accompanied by: self and family member  PERTINENT HISTORY:   Patient complains of Dysphagia patient is awaiting the speech evaluation scheduled according to the daughter next week. He had had pneumonia probably aspiration pneumonia he is getting frail and weakness probably having swallowing muscle weakness.  Difficulty in coughing even with liquids and solid as per the daughter.          His other issue is constant nasal drip and drainage specially worse with eating more rhinitis that also is making things worse.   PAIN:  Are you having pain? No  FALLS: Has patient fallen in last 6 months?  Yes  LIVING  ENVIRONMENT: Lives with: lives with their family and lives with their daughter Lives in: House/apartment  PLOF:  Level of assistance: Independent with ADLs, Independent with IADLs Employment: Retired  PATIENT GOALS: "get Engineer, manufacturing systems.   OBJECTIVE:   DIAGNOSTIC FINDINGS:  MBSS 05/31/2023: Clinical Impression: Clinical Impression: Pt demonstrates moderate oropharyngeal dysphagia with primary impairment of delayed swallow initaition. Pt has silent trace aspiration of thin liquids before the swallow with a large straw sip and sensed aspiration of larger quantity with partial ejection before the swallow with a large sip take to transit a pill. There were also trace aspiration events with single straw sips with a chin tuck. Best methods to prevent aspiration are small, controlled single sips. Breath hold/supraglottic swallow also effective with small/medium sized sips, though this would need a great deal of practice. Otherwise, oral movement a little slow but strong and complete and pharyngeal strength good with only a trace quantity of residue. Recommend pt continue a regular diet and thin liquids, small single sips from a cup, pills whole in puree. Oral hygiene important. Nectar thick liquids were tolerated very well if needed, but hopeful that increased awareness and training with SLP can help pt better tolerate thin liquids.   05/28/2022 ENT Flexible laryngoscopy  "After adequate topical anesthetic was applied, 2 mm flexible laryngoscope was passed through the nasal cavity without difficulty. Flexible laryngoscopy shows patent anterior nasal cavity with minimal crusting, no discharge or infection.  Normal base of tongue and supraglottis Normal vocal cord mobility with slight bowing of true vocal folds, slight suggestion of bilateral nodule formation at the posterior one third of each cord. The hypopharynx normal without mass, pooling of secretions or aspiration.  Patient tolerated  procedure without complication or difficulty.  Kermit Balo. [place], PA-C  Impression & Plans:   1) hoarseness 2) presbylaryngis"  COGNITION: Overall cognitive status: Within functional limits for tasks assessed Functional deficits: pt and daughter endorse no cognitive deficits   ORAL MOTOR EXAMINATION: Overall status: WFL Comments: Horse, breathy, and weak voice. Patient demonstrated with slight left side deviation observed during jaw opening.   CLINICAL SWALLOW ASSESSMENT:   Current diet: regular Dentition: Dentures top/bottom Patient directly observed with POs: Yes: thin liquids  Feeding: able to feed self Liquids provided by: cup Oral phase signs and symptoms:  No signs Pharyngeal phase signs and symptoms: immediate cough, perceptually appears weak; reddening face; shortness of breath  Patient report: ongoing coughing with thin liquids with usual coughing and sensation of liquid "going the wrong way." Endorses shortness of breath, denies odynophagia. Occasional cough with solids, sensation of "sticking" though not as frequently occurring as coughing with liquid. Recent hx of PNA. Began ~6 months ago, co-occurring with acute challenges with word finding which appears c/w aphasia.   AUDITORY COMPREHENSION: Overall auditory comprehension: Appears intact YES/NO questions: Appears intact Following directions: Appears  intact Conversation: Moderately Complex  READING COMPREHENSION: Intact  EXPRESSION: verbal  VERBAL EXPRESSION: Level of generative/spontaneous verbalization: conversation, though reduced MLU  Automatic speech: counting: intact  Repetition: Impaired: Word Naming: Confrontation:  54% Pragmatics: Appears intact Discourse: reduced MLU, reduced rate, mixed paraphasias (primarily phonemic), agrammatism, anomia; no evidence of apraxia Effective technique: none trialed d/t time constraints  Non-verbal means of communication: none evidenced  STANDARDIZED  ASSESSMENTS: QAB: Moderate (6.04)   PATIENT REPORTED OUTCOME MEASURES (PROM): EAT-10: 36/40    TODAY'S TREATMENT:                                                                                                                                         DATE:   07/01/23: Patient able to to complete ten hard swallows during today's session in min-I, and occasional corrective feedback. Monthly calendar given to help keep track of days HEP completed, as he was forgetting days. Led through [employer], and patient was able frequently mod-A of frequent feedback as he struggled to create labial seal with device. Measurement decreased to 60 and patient was able to complete 3 reps of 5. Targeted dysarthria through functional phrases, provided with frequent max-A of models, having the patient watch the clinician's mouth while speaking, and providing education on what to do when the correct word would not come out (I.e. providing context clues, telling listener what the letter starts with). Patient demonstrates struggle with the /c/ sound and high vowels. HEP to continue hard swallows and EMST, as well as to practice functional phrases. All questions answered to patient satisfaction.   06/28/23: Education on purpose of swallowing therapy and why it is important. Patient cleared throat x3 during today's session. Targeted cough efficiency through EMST. Education provided on SLP conducted assessment of MEP for EMST. Pt's maximum expiratory pressure today was 93 cm H2O. EMST 150 device was started at 75% MEP at 65 cm H2O. SLP provided demonstration and verbal instruction to utilize EMST device. Pt completed 3 repetitions over 3 sets during our session. HEP initiated for 5 sets of 5 reps for 5 days/week. HEP previously completed of hard swallows 6 times twice a day, encouraged to increase to 15 hard swallows. Check list given to help keep track. HEP given to implement EMST 5 sets of 5. All questions answered to patient  satisfaction. Targeted dysarthria through 3-4 syllable words. Patient's intelligibility ~20% and required max-A of immediate feedback, models, and syllable breaks. Transitioned to functional conversational phrases. Created a functional list of conversational topics for those in his life. HEP given of functional phrase list.   06/23/23: Targeted dysarthria through tri-syllabic words patient with ~20% intelligibility on initial attempt. Patient required frequent max-A benefitting from syllabification (oral and written), immediate feedback, choral production. Patient was able to increase his ineligibility to ~40%. Patient struggles when transitioning sounds from back to front. Sounds /k/ presented the most struggle, as well as high  vowels. Led through structured conversational sentences when provided with visual models, and breaking words up into smaller parts. Patient was able to increase intelligibility with multiple practice and models and reminders to slow down and take his time. Education provided on compensations to facilitative improved communication efficacy in conversation: having listening partners pay attention and "listen close" to patient while he is speaking, and having them repeat parts they heard so that patient only has to repeat the unclear words. At the end of the session, patient stated that food keeps getting stuck in his throat, and is getting worse. Encouraged to continue swallow exercises at home. Next session will add EMST.   06/21/23: Patient states that HEP was implemented of 10 effortful swallows. Patient read through conversational phrases with 40% intelligibility, with reminders to take breaths and take time. Daughter states that speech clarity has gotten worse and she struggles to understand him now. SLP led through SOVTE to warm up voice. Patient struggled with higher pitches, with and without the straw and stated that it took more effort. Patient able to increase tone through straw  with max-A of models, specific and immediate feedback. Education on how voice and swallow are related, as well as how looking at someone who is speaking to increase clarity. HEP given of SOVTE daily to warm up voice, reading through tri-syllabic words, as well as reading Bible out loud to increase clarity. All questions answered to patient satisfaction. Next session will focus on tri-syllabic words.   06/03/23: ST targeting dysphagia and spoken expression (word finding and speech intelligibility)  92526: Reviewed how HEP went at home, pt stated that he was able to complete 3 sets of 10 of the hard swallows and states that exercise was easy for him. SLP led through 20 effortful swallows during today's session.  [id number]: SLP targeted articulation and intelligibility on the word level by having patient name items within a category, and rating intelligibility scale 1-3. Patient's rating of intelligibly matched the SLP's 100% of the time. Patient required min-mod-A during today's session consisting of models, and reminders to slow down. Clarity was better at the single word level, and decreased with phrases. Led through functional phrases, patient able to increase intelligibility after prompts to check in on his speech. Patient improved throughout the session on taking time and breaking up his speech sounds. HEP given to continue effortful swallows of 3x for 20 times. Education given on tongue movement in regard to producing the /h/ and /k/ sound.   06/01/23: ST discussed results from MBSS with patient and daughter, all questions answered to patient satisfaction. Education on effortful swallow with modeling, patient unable to swallow with a dry mouth, recommended 5 mL water. Patient required frequent mod-A with models and reminders to breathe. Patient sent home with HEP of 20 effortful swallows 2-3x day. Targeted word finding using [employer], through picture description task. Patient was able to  answer questions related to the topic, and relate it to personal topics with min-A. Speech intelligibility continues to be decreased.    05/27/23: Targeted word finding through SFA. Patient required frequent min-A to find words related to topic. He struggled the most with finding the category what the item reminded him of. Patient stated that he enjoyed this intervention more then VNeST, but that both were the same in difficulty. Clarify of voice remains the same throughout the session due to vocal strain. MBSS scheduled for 6/18, and SLP will review results next session. All questions answered to patient satisfaction.  05/24/23: Targeted word finding, verbal apraxia, sentence generation with Scientist, product/process development (VNeST). For the verbs watch, play, cook, read, the patient generated 3 subject and objects for each verb, a total of three subjects and objects with mod-A verbal, questioning, written cues required. Pt generated complex sentences, 1 for each verb, by answering "wh" questions with mod-A cues, for a total of 12 complex sentences. Daughter was able to aid in some clarifications during times of poor intelligibility due to increase in background knowledge.     05/19/23: Evaluation completed. MBSS explained and all questions answered. Reviewed POC and goals with pt and daughter verbalizing agreement.   PATIENT EDUCATION: Education details: See above Person educated: Patient and Child(ren) Education method: Explanation Education comprehension: verbalized understanding   ASSESSMENT:  CLINICAL IMPRESSION: Patient is a 89 y.o. M who was seen today for dysphagia evaluation. Clinical swallow evaluation significant for s/sx of pharyngeal dysphagia with thin liquid trial. Patient presented with immediate cough after thin liquid trial with subsequent shortness of breath. MBSS recommended, with pt and daughter endorsing agreement. Patient presented with hoarse and strained voice, and stated  that he often runs out of air when eating and speaking. Denied change is voice presentation though reduced intelligibility resulted from vocal strain. QAB indicates moderate aphasia. Pt's receptive language appears grossly intact, expressive language is c/b slow, halting speech with agrammatism, anomia, and paraphasias. Discourse cohesion negatively impacted, communication partner required to A pt in expressing desired message. Pt's and daughter endorse this is new, co-occurring with swallow changes which began 6 months ago without clear etiology. Chart review w/o brain imaging so unable to determine if organic etiology may be impacting these changes. PCP may wish to consider brain imaging to further evaluate the cause of these new challenges. Swallowing challenges could potentially be related to hx of voice changes, endorsed by ENT visit 05/2022, however does not explain new challenges with word finding. Pt would benefit from ST to address dysphagia and aphasia to maximize independence, increase safety during the swallow, and improve communication efficacy.    OBJECTIVE IMPAIRMENTS: include voice disorder and dysphagia. These impairments are limiting patient from safety when swallowing. Factors affecting potential to achieve goals and functional outcome are previous level of function. Patient will benefit from skilled SLP services to address above impairments and improve overall function.  REHAB POTENTIAL: Good   GOALS: Goals reviewed with patient? Yes  SHORT TERM GOALS: Target date: 06/16/2023  Pt will participate in MBSS Baseline: Goal status: MET  2.  Pt will teach back and demonstrate use of swallow strategies and targeted swallow exercises with 100% accuracy, with use of visual aid PRN Baseline:  Goal status: MET  3.  Pt will demonstrate completion of x4 potential aphasia HEP activities with mod-A from SLP  Baseline:  Goal status: MET  4.  Pt will report HEP for dysphagia/aphasia  completion 5/7 days over 1 week period Baseline:  Goal status: NOT MET   LONG TERM GOALS: Target date: 08/11/2023  Pt will report successful completion of HEP 5/7 days over 2 week period with support from daughter Baseline:  Goal status: IN PROGRESS  2.  Pt will complete structured language tasks with 80% accuracy given occasional mod-A Baseline:  Goal status: IN PROGRESS  3.  Pt will report improvement via EAT-10 by dc Baseline:  Goal status: IN PROGRESS  4.  Pt will complete f/u MBSS, if indicated  Baseline:  Goal status: IN PROGRESS  PLAN:  SLP FREQUENCY: 2x/week  SLP  DURATION: 12 weeks  PLANNED INTERVENTIONS: Aspiration precaution training, Pharyngeal strengthening exercises, Diet toleration management , Language facilitation, Trials of upgraded texture/liquids, Cueing hierachy, Functional tasks, Multimodal communication approach, SLP instruction and feedback, Compensatory strategies, Patient/family education, and Re-evaluation  [place], Student-SLP 07/01/2023, 9:42 AM

## 2023-07-04 ENCOUNTER — Other Ambulatory Visit: Payer: Self-pay | Admitting: Cardiovascular Disease

## 2023-07-05 ENCOUNTER — Ambulatory Visit: Payer: Medicare Other | Admitting: Speech Pathology

## 2023-07-08 ENCOUNTER — Ambulatory Visit: Payer: Medicare Other | Admitting: Speech Pathology

## 2023-07-08 DIAGNOSIS — R471 Dysarthria and anarthria: Secondary | ICD-10-CM | POA: Diagnosis not present

## 2023-07-08 DIAGNOSIS — R4701 Aphasia: Secondary | ICD-10-CM | POA: Diagnosis not present

## 2023-07-08 DIAGNOSIS — R1310 Dysphagia, unspecified: Secondary | ICD-10-CM | POA: Diagnosis not present

## 2023-07-08 NOTE — Therapy (Signed)
OUTPATIENT SPEECH LANGUAGE PATHOLOGY TREATMENT   Patient Name: Todd Armstrong MRN: [id number] DOB:Feb 25, 1931, 89 y.o., male Today's Date: 07/08/2023  PCP: Emilio Aspen, MD REFERRING PROVIDER: Georgann Housekeeper, MD  END OF SESSION:  End of Session - 07/08/23 4098     Visit Number 10    Number of Visits 25    Date for SLP Re-Evaluation 08/11/23    Progress Note Due on Visit 10    SLP Start Time 0930    SLP Stop Time  1015    SLP Time Calculation (min) 45 min             Past Medical History:  Diagnosis Date   Acute myocardial infarction    Anemia    Aortic insufficiency    Carotid stenosis    Chronic combined systolic and diastolic CHF (congestive heart failure) (HCC)    Coronary artery disease    PTCA and stenting of his right coronary artery. 3.5 x 16-mm Liberte stent.               Diabetes mellitus without complication (HCC)    Dyslipidemia    Emphysema (subcutaneous) (surgical) resulting from a procedure    Hyperlipemia    Hypertension    Mitral regurgitation    Partial tear of rotator cuff    right shoulder   Peripheral vascular disease (HCC)    Persistent atrial fibrillation (HCC)    Pulmonary hypertension (HCC)    Stroke [facility])    Wears dentures    Past Surgical History:  Procedure Laterality Date   CARDIAC CATHETERIZATION  11/05/2008   CARDIAC CATHETERIZATION  10/22/1993   EF 60%   CARDIOVERSION N/A 06/05/2020   Procedure: CARDIOVERSION;  Surgeon: Vesta Mixer, MD;  Location: [facility] ENDOSCOPY;  Service: Cardiovascular;  Laterality: N/A;   CAROTID ENDARTERECTOMY Right 03/23/2018   CORONARY ANGIOPLASTY WITH STENT PLACEMENT     ENDARTERECTOMY Right 03/23/2018   Procedure: RIGHT CAROTID ARTERY ENDARTERECTOMY;  Surgeon: Maeola Harman, MD;  Location: [facility] OR;  Service: Vascular;  Laterality: Right;   FRACTURE SURGERY     righ fibula and tibia   HERNIA REPAIR  1984   KNEE ARTHROSCOPY Left    MULTIPLE TOOTH EXTRACTIONS     PATCH  ANGIOPLASTY Right 03/23/2018   Procedure: WITH Livia Snellen 1CM X 6CM PATCH ANGIOPLASTY;  Surgeon: Maeola Harman, MD;  Location: [facility] OR;  Service: Vascular;  Laterality: Right;   SHOULDER ARTHROSCOPY Right 06/09/2017   Procedure: ARTHROSCOPY SHOULDER SUBACROMIAL DECOMPRESSION AND ACROMIOPLASTY;  Surgeon: Marcene Corning, MD;  Location: [facility] OR;  Service: Orthopedics;  Laterality: Right;  GENERAL ANESTHESIA WITH BLOCK   Patient Active Problem List   Diagnosis Date Noted   Atrial fibrillation, persistent (HCC) 02/17/2023   CKD (chronic kidney disease) stage 3, GFR 30-59 ml/min (HCC) 02/17/2023   CHF exacerbation (HCC) 09/27/2021   Emphysema/COPD (HCC) 09/27/2021   Type 2 diabetes mellitus with hyperlipidemia (HCC) 09/27/2021   Pulmonary HTN (HCC) 06/03/2021   Persistent atrial fibrillation (HCC)    Carotid stenosis, asymptomatic, right 03/23/2018   Bilateral carotid bruits 10/15/2016   Acute myocardial infarction    Dyslipidemia    Coronary artery disease    Hypertension    Hyperlipemia     ONSET DATE: 04/27/23   REFERRING DIAG:  R13.10 (ICD-10-CM) - Dysphagia, unspecified   THERAPY DIAG:  Dysphagia, unspecified type  Dysarthria and anarthria  Aphasia  Rationale for Evaluation and Treatment: Rehabilitation  SUBJECTIVE:   SUBJECTIVE STATEMENT: "[EMST] easy"  Pt  accompanied by: self and family member  PERTINENT HISTORY:   Patient complains of Dysphagia patient is awaiting the speech evaluation scheduled according to the daughter next week. He had had pneumonia probably aspiration pneumonia he is getting frail and weakness probably having swallowing muscle weakness.  Difficulty in coughing even with liquids and solid as per the daughter.          His other issue is constant nasal drip and drainage specially worse with eating more rhinitis that also is making things worse.   PAIN:  Are you having pain? No  FALLS: Has patient fallen in last 6 months?  Yes  LIVING  ENVIRONMENT: Lives with: lives with their family and lives with their daughter Lives in: House/apartment  PLOF:  Level of assistance: Independent with ADLs, Independent with IADLs Employment: Retired  PATIENT GOALS: "get Engineer, manufacturing systems.   OBJECTIVE:   DIAGNOSTIC FINDINGS:  MBSS 05/31/2023: Clinical Impression: Clinical Impression: Pt demonstrates moderate oropharyngeal dysphagia with primary impairment of delayed swallow initaition. Pt has silent trace aspiration of thin liquids before the swallow with a large straw sip and sensed aspiration of larger quantity with partial ejection before the swallow with a large sip take to transit a pill. There were also trace aspiration events with single straw sips with a chin tuck. Best methods to prevent aspiration are small, controlled single sips. Breath hold/supraglottic swallow also effective with small/medium sized sips, though this would need a great deal of practice. Otherwise, oral movement a little slow but strong and complete and pharyngeal strength good with only a trace quantity of residue. Recommend pt continue a regular diet and thin liquids, small single sips from a cup, pills whole in puree. Oral hygiene important. Nectar thick liquids were tolerated very well if needed, but hopeful that increased awareness and training with SLP can help pt better tolerate thin liquids.  05/28/2022 ENT Flexible laryngoscopy  "After adequate topical anesthetic was applied, 2 mm flexible laryngoscope was passed through the nasal cavity without difficulty. Flexible laryngoscopy shows patent anterior nasal cavity with minimal crusting, no discharge or infection.  Normal base of tongue and supraglottis Normal vocal cord mobility with slight bowing of true vocal folds, slight suggestion of bilateral nodule formation at the posterior one third of each cord. The hypopharynx normal without mass, pooling of secretions or aspiration.  Patient tolerated procedure  without complication or difficulty.  Kermit Balo. [place]hour, PA-C  Impression & Plans:   1) hoarseness 2) presbylaryngis"  COGNITION: Overall cognitive status: Within functional limits for tasks assessed Functional deficits: pt and daughter endorse no cognitive deficits   ORAL MOTOR EXAMINATION: Overall status: WFL Comments: Horse, breathy, and weak voice. Patient demonstrated with slight left side deviation observed during jaw opening.   CLINICAL SWALLOW ASSESSMENT:   Current diet: regular Dentition: Dentures top/bottom Patient directly observed with POs: Yes: thin liquids  Feeding: able to feed self Liquids provided by: cup Oral phase signs and symptoms:  No signs Pharyngeal phase signs and symptoms: immediate cough, perceptually appears weak; reddening face; shortness of breath  Patient report: ongoing coughing with thin liquids with usual coughing and sensation of liquid "going the wrong way." Endorses shortness of breath, denies odynophagia. Occasional cough with solids, sensation of "sticking" though not as frequently occurring as coughing with liquid. Recent hx of PNA. Began ~6 months ago, co-occurring with acute challenges with word finding which appears c/w aphasia.   AUDITORY COMPREHENSION: Overall auditory comprehension: Appears intact YES/NO questions: Appears intact Following directions: Appears intact  Conversation: Moderately Complex  READING COMPREHENSION: Intact  EXPRESSION: verbal  VERBAL EXPRESSION: Level of generative/spontaneous verbalization: conversation, though reduced MLU  Automatic speech: counting: intact  Repetition: Impaired: Word Naming: Confrontation:  54% Pragmatics: Appears intact Discourse: reduced MLU, reduced rate, mixed paraphasias (primarily phonemic), agrammatism, anomia; no evidence of apraxia Effective technique: none trialed d/t time constraints  Non-verbal means of communication: none evidenced  STANDARDIZED ASSESSMENTS: QAB:  Moderate (6.04)   PATIENT REPORTED OUTCOME MEASURES (PROM): EAT-10: 36/40    TODAY'S TREATMENT:                                                                                                                                         DATE:   07/08/23: Patient has been implementing HEP of EMST at home 60% of the time. He was able to complete 3 reps or 5 EMST during the session and stated that it was easy and the device was increased. Patient's intelligibility ~40% on unstructured phrase/sentence, level. Education provided on taking time while speaking, and waiting for the listener to provide feedback/comprehension. SLP targeted dysathria through functional phrases, pt required frequent mod-A of frequent feedback, models, semantic cues, and reminders to take deep breaths before speaking. Patient was able to rate his intelligibility on a scale of 1 (not clear), 2 (can be improved), 3 (clear). Patient's perception of intelligibly matched the SLP's opinion, and was able to increase intelligibility with additional practice, deep breaths, and taking his time. SLP recorded pt's voice in order to provide biofeedback. Pt benefits from breaking words/phrases into smaller syllable/break and reminders to breathe.   PATIENT EDUCATION: Education details: See above Person educated: Patient and Child(ren) Education method: Explanation Education comprehension: verbalized understanding   ASSESSMENT:  CLINICAL IMPRESSION: Ongoing education and training for use of EMST to support dysphagia rehabilitation and dysarthria compensations at phrase level. Pts dysarthria improves when provided with guidance on syllabification and reminders to breathe and take time while speaking. Pt requires usual cues but is achieving intelligible productions more consistantly during structured tasks. Pt continues to benefit from skilled ST.   OBJECTIVE IMPAIRMENTS: include voice disorder and dysphagia. These impairments are limiting  patient from safety when swallowing. Factors affecting potential to achieve goals and functional outcome are previous level of function. Patient will benefit from skilled SLP services to address above impairments and improve overall function.  REHAB POTENTIAL: Good   GOALS: Goals reviewed with patient? Yes  SHORT TERM GOALS: Target date: 06/16/2023  Pt will participate in MBSS Baseline: Goal status: MET  2.  Pt will teach back and demonstrate use of swallow strategies and targeted swallow exercises with 100% accuracy, with use of visual aid PRN Baseline:  Goal status: MET  3.  Pt will demonstrate completion of x4 potential aphasia HEP activities with mod-A from SLP  Baseline:  Goal status: MET  4.  Pt will report HEP for dysphagia/aphasia completion 5/7 days  over 1 week period Baseline:  Goal status: NOT MET   LONG TERM GOALS: Target date: 08/11/2023  Pt will report successful completion of HEP 5/7 days over 2 week period with support from daughter Baseline:  Goal status: IN PROGRESS  2.  Pt will complete structured language tasks with 80% accuracy given occasional mod-A Baseline:  Goal status: IN PROGRESS  3.  Pt will report improvement via EAT-10 by dc Baseline:  Goal status: IN PROGRESS  4.  Pt will complete f/u MBSS, if indicated  Baseline:  Goal status: IN PROGRESS  PLAN:  SLP FREQUENCY: 2x/week  SLP DURATION: 12 weeks  PLANNED INTERVENTIONS: Aspiration precaution training, Pharyngeal strengthening exercises, Diet toleration management , Language facilitation, Trials of upgraded texture/liquids, Cueing hierachy, Functional tasks, Multimodal communication approach, SLP instruction and feedback, Compensatory strategies, Patient/family education, and Re-evaluation  [place], Student-SLP 07/08/2023, 9:32 AM

## 2023-07-12 ENCOUNTER — Ambulatory Visit: Payer: Medicare Other | Admitting: Speech Pathology

## 2023-07-12 DIAGNOSIS — R4701 Aphasia: Secondary | ICD-10-CM | POA: Diagnosis not present

## 2023-07-12 DIAGNOSIS — R1310 Dysphagia, unspecified: Secondary | ICD-10-CM | POA: Diagnosis not present

## 2023-07-12 DIAGNOSIS — R471 Dysarthria and anarthria: Secondary | ICD-10-CM | POA: Diagnosis not present

## 2023-07-12 NOTE — Therapy (Signed)
OUTPATIENT SPEECH LANGUAGE PATHOLOGY TREATMENT   Patient Name: Todd Armstrong MRN: [id number] DOB:Oct 18, 1931, 89 y.o., male Today's Date: 07/12/2023  PCP: Emilio Aspen, MD REFERRING PROVIDER: Georgann Housekeeper, MD  END OF SESSION:  End of Session - 07/12/23 0906     Visit Number 11    Number of Visits 25    Date for SLP Re-Evaluation 08/11/23    Progress Note Due on Visit 10    SLP Start Time 0930    SLP Stop Time  1015    SLP Time Calculation (min) 45 min             Past Medical History:  Diagnosis Date   Acute myocardial infarction    Anemia    Aortic insufficiency    Carotid stenosis    Chronic combined systolic and diastolic CHF (congestive heart failure) (HCC)    Coronary artery disease    PTCA and stenting of his right coronary artery. 3.5 x 16-mm Liberte stent.               Diabetes mellitus without complication (HCC)    Dyslipidemia    Emphysema (subcutaneous) (surgical) resulting from a procedure    Hyperlipemia    Hypertension    Mitral regurgitation    Partial tear of rotator cuff    right shoulder   Peripheral vascular disease (HCC)    Persistent atrial fibrillation (HCC)    Pulmonary hypertension (HCC)    Stroke [facility])    Wears dentures    Past Surgical History:  Procedure Laterality Date   CARDIAC CATHETERIZATION  11/05/2008   CARDIAC CATHETERIZATION  10/22/1993   EF 60%   CARDIOVERSION N/A 06/05/2020   Procedure: CARDIOVERSION;  Surgeon: Vesta Mixer, MD;  Location: [facility] ENDOSCOPY;  Service: Cardiovascular;  Laterality: N/A;   CAROTID ENDARTERECTOMY Right 03/23/2018   CORONARY ANGIOPLASTY WITH STENT PLACEMENT     ENDARTERECTOMY Right 03/23/2018   Procedure: RIGHT CAROTID ARTERY ENDARTERECTOMY;  Surgeon: Maeola Harman, MD;  Location: [facility] OR;  Service: Vascular;  Laterality: Right;   FRACTURE SURGERY     righ fibula and tibia   HERNIA REPAIR  1984   KNEE ARTHROSCOPY Left    MULTIPLE TOOTH EXTRACTIONS     PATCH  ANGIOPLASTY Right 03/23/2018   Procedure: WITH Livia Snellen 1CM X 6CM PATCH ANGIOPLASTY;  Surgeon: Maeola Harman, MD;  Location: [facility] OR;  Service: Vascular;  Laterality: Right;   SHOULDER ARTHROSCOPY Right 06/09/2017   Procedure: ARTHROSCOPY SHOULDER SUBACROMIAL DECOMPRESSION AND ACROMIOPLASTY;  Surgeon: Marcene Corning, MD;  Location: [facility] OR;  Service: Orthopedics;  Laterality: Right;  GENERAL ANESTHESIA WITH BLOCK   Patient Active Problem List   Diagnosis Date Noted   Atrial fibrillation, persistent (HCC) 02/17/2023   CKD (chronic kidney disease) stage 3, GFR 30-59 ml/min (HCC) 02/17/2023   CHF exacerbation (HCC) 09/27/2021   Emphysema/COPD (HCC) 09/27/2021   Type 2 diabetes mellitus with hyperlipidemia (HCC) 09/27/2021   Pulmonary HTN (HCC) 06/03/2021   Persistent atrial fibrillation (HCC)    Carotid stenosis, asymptomatic, right 03/23/2018   Bilateral carotid bruits 10/15/2016   Acute myocardial infarction    Dyslipidemia    Coronary artery disease    Hypertension    Hyperlipemia     ONSET DATE: 04/27/23   REFERRING DIAG:  R13.10 (ICD-10-CM) - Dysphagia, unspecified   THERAPY DIAG:  Dysphagia, unspecified type  Rationale for Evaluation and Treatment: Rehabilitation  SUBJECTIVE:   SUBJECTIVE STATEMENT: "Good"--answer to how things are going at home.  Pt  accompanied by: self  PERTINENT HISTORY:   Patient complains of Dysphagia patient is awaiting the speech evaluation scheduled according to the daughter next week. He had had pneumonia probably aspiration pneumonia he is getting frail and weakness probably having swallowing muscle weakness.  Difficulty in coughing even with liquids and solid as per the daughter.          His other issue is constant nasal drip and drainage specially worse with eating more rhinitis that also is making things worse.   PAIN:  Are you having pain? No  FALLS: Has patient fallen in last 6 months?  Yes  LIVING ENVIRONMENT: Lives with:  lives with their family and lives with their daughter Lives in: House/apartment  PLOF:  Level of assistance: Independent with ADLs, Independent with IADLs Employment: Retired  PATIENT GOALS: "get Engineer, manufacturing systems.   OBJECTIVE:   DIAGNOSTIC FINDINGS:  MBSS 05/31/2023: Clinical Impression: Clinical Impression: Pt demonstrates moderate oropharyngeal dysphagia with primary impairment of delayed swallow initaition. Pt has silent trace aspiration of thin liquids before the swallow with a large straw sip and sensed aspiration of larger quantity with partial ejection before the swallow with a large sip take to transit a pill. There were also trace aspiration events with single straw sips with a chin tuck. Best methods to prevent aspiration are small, controlled single sips. Breath hold/supraglottic swallow also effective with small/medium sized sips, though this would need a great deal of practice. Otherwise, oral movement a little slow but strong and complete and pharyngeal strength good with only a trace quantity of residue. Recommend pt continue a regular diet and thin liquids, small single sips from a cup, pills whole in puree. Oral hygiene important. Nectar thick liquids were tolerated very well if needed, but hopeful that increased awareness and training with SLP can help pt better tolerate thin liquids.  05/28/2022 ENT Flexible laryngoscopy  "After adequate topical anesthetic was applied, 2 mm flexible laryngoscope was passed through the nasal cavity without difficulty. Flexible laryngoscopy shows patent anterior nasal cavity with minimal crusting, no discharge or infection.  Normal base of tongue and supraglottis Normal vocal cord mobility with slight bowing of true vocal folds, slight suggestion of bilateral nodule formation at the posterior one third of each cord. The hypopharynx normal without mass, pooling of secretions or aspiration.  Patient tolerated procedure without complication or  difficulty.  Kermit Balo. [place], PA-C  Impression & Plans:   1) hoarseness 2) presbylaryngis"  COGNITION: Overall cognitive status: Within functional limits for tasks assessed Functional deficits: pt and daughter endorse no cognitive deficits   ORAL MOTOR EXAMINATION: Overall status: WFL Comments: Horse, breathy, and weak voice. Patient demonstrated with slight left side deviation observed during jaw opening.   CLINICAL SWALLOW ASSESSMENT:   Current diet: regular Dentition: Dentures top/bottom Patient directly observed with POs: Yes: thin liquids  Feeding: able to feed self Liquids provided by: cup Oral phase signs and symptoms:  No signs Pharyngeal phase signs and symptoms: immediate cough, perceptually appears weak; reddening face; shortness of breath  Patient report: ongoing coughing with thin liquids with usual coughing and sensation of liquid "going the wrong way." Endorses shortness of breath, denies odynophagia. Occasional cough with solids, sensation of "sticking" though not as frequently occurring as coughing with liquid. Recent hx of PNA. Began ~6 months ago, co-occurring with acute challenges with word finding which appears c/w aphasia.   AUDITORY COMPREHENSION: Overall auditory comprehension: Appears intact YES/NO questions: Appears intact Following directions: Appears intact Conversation: Moderately Complex  READING COMPREHENSION: Intact  EXPRESSION: verbal  VERBAL EXPRESSION: Level of generative/spontaneous verbalization: conversation, though reduced MLU  Automatic speech: counting: intact  Repetition: Impaired: Word Naming: Confrontation:  54% Pragmatics: Appears intact Discourse: reduced MLU, reduced rate, mixed paraphasias (primarily phonemic), agrammatism, anomia; no evidence of apraxia Effective technique: none trialed d/t time constraints  Non-verbal means of communication: none evidenced  STANDARDIZED ASSESSMENTS: QAB: Moderate (6.04)   PATIENT  REPORTED OUTCOME MEASURES (PROM): EAT-10: 36/40    TODAY'S TREATMENT:                                                                                                                                         DATE:   07/12/23: Targeted dysphagia through EMST, patient was able to complete 2 reps 5 during today's session. Patient demonstrated 20 hard swallows during today's session with min-I and two coughs to clear. Targeted dysarthria through reading of functional phrases with ~60% inteilltbility. These phrases have been practiced during HEP. Unstructured conversation decreases intelligibility  to ~30% with frequent mod-A reminders to slow down and give semantic/phonemic cues to the listener. Today's session focused on /s/ words with frequent mod-A of models, immediate feedback, and education on providing gestures to the listener to aid in comprehension. HEP to continue EMST, hard swallows, and practicing words with /s/ sounds focusing on clarity, and taking time.   07/08/23: Patient has been implementing HEP of EMST at home 60% of the time. He was able to complete 3 reps or 5 EMST during the session and stated that it was easy and the device was increased. Patient's intelligibility ~40% on unstructured phrase/sentence, level. Education provided on taking time while speaking, and waiting for the listener to provide feedback/comprehension. SLP targeted dysathria through functional phrases, pt required frequent mod-A of frequent feedback, models, semantic cues, and reminders to take deep breaths before speaking. Patient was able to rate his intelligibility on a scale of 1 (not clear), 2 (can be improved), 3 (clear). Patient's perception of intelligibly matched the SLP's opinion, and was able to increase intelligibility with additional practice, deep breaths, and taking his time. SLP recorded pt's voice in order to provide biofeedback. Pt benefits from breaking words/phrases into smaller syllable/break and  reminders to breathe.   PATIENT EDUCATION: Education details: See above Person educated: Patient and Child(ren) Education method: Explanation Education comprehension: verbalized understanding   ASSESSMENT:  CLINICAL IMPRESSION: Ongoing education and training for use of EMST to support dysphagia rehabilitation and dysarthria compensations at phrase level. Pts dysarthria improves when provided with guidance on syllabification and reminders to breathe and take time while speaking. Pt requires usual cues but is achieving intelligible productions more consistantly during structured tasks. Pt continues to benefit from skilled ST.   OBJECTIVE IMPAIRMENTS: include voice disorder and dysphagia. These impairments are limiting patient from safety when swallowing. Factors affecting potential to achieve goals and functional outcome are previous level of function.  Patient will benefit from skilled SLP services to address above impairments and improve overall function.  REHAB POTENTIAL: Good   GOALS: Goals reviewed with patient? Yes  SHORT TERM GOALS: Target date: 06/16/2023  Pt will participate in MBSS Baseline: Goal status: MET  2.  Pt will teach back and demonstrate use of swallow strategies and targeted swallow exercises with 100% accuracy, with use of visual aid PRN Baseline:  Goal status: MET  3.  Pt will demonstrate completion of x4 potential aphasia HEP activities with mod-A from SLP  Baseline:  Goal status: MET  4.  Pt will report HEP for dysphagia/aphasia completion 5/7 days over 1 week period Baseline:  Goal status: NOT MET   LONG TERM GOALS: Target date: 08/11/2023  Pt will report successful completion of HEP 5/7 days over 2 week period with support from daughter Baseline:  Goal status: MET   2.  Pt will complete structured language tasks with 80% accuracy given occasional mod-A Baseline:  Goal status: IN PROGRESS  3.  Pt will report improvement via EAT-10 by  dc Baseline:  Goal status: IN PROGRESS  4.  Pt will complete f/u MBSS, if indicated  Baseline:  Goal status: MET  PLAN:  SLP FREQUENCY: 2x/week  SLP DURATION: 12 weeks  PLANNED INTERVENTIONS: Aspiration precaution training, Pharyngeal strengthening exercises, Diet toleration management , Language facilitation, Trials of upgraded texture/liquids, Cueing hierachy, Functional tasks, Multimodal communication approach, SLP instruction and feedback, Compensatory strategies, Patient/family education, and Re-evaluation  [place], Student-SLP 07/12/2023, 9:07 AM

## 2023-07-15 ENCOUNTER — Ambulatory Visit: Payer: Medicare Other | Admitting: Speech Pathology

## 2023-07-19 ENCOUNTER — Ambulatory Visit: Payer: Medicare Other | Attending: Internal Medicine | Admitting: Speech Pathology

## 2023-07-19 DIAGNOSIS — R1310 Dysphagia, unspecified: Secondary | ICD-10-CM | POA: Diagnosis present

## 2023-07-19 DIAGNOSIS — R4701 Aphasia: Secondary | ICD-10-CM | POA: Insufficient documentation

## 2023-07-19 DIAGNOSIS — R471 Dysarthria and anarthria: Secondary | ICD-10-CM | POA: Insufficient documentation

## 2023-07-19 NOTE — Therapy (Signed)
OUTPATIENT SPEECH LANGUAGE PATHOLOGY TREATMENT   Patient Name: Todd Armstrong MRN: [id number] DOB:Mar 10, 1931, 89 y.o., male Today's Date: 07/19/2023  PCP: Emilio Aspen, MD REFERRING PROVIDER: Georgann Housekeeper, MD  END OF SESSION:  End of Session - 07/19/23 1015     Visit Number 12    Number of Visits 25    Date for SLP Re-Evaluation 08/11/23    Progress Note Due on Visit 10    SLP Start Time 1015    SLP Stop Time  1100    SLP Time Calculation (min) 45 min    Activity Tolerance Patient tolerated treatment well              Past Medical History:  Diagnosis Date   Acute myocardial infarction    Anemia    Aortic insufficiency    Carotid stenosis    Chronic combined systolic and diastolic CHF (congestive heart failure) (HCC)    Coronary artery disease    PTCA and stenting of his right coronary artery. 3.5 x 16-mm Liberte stent.               Diabetes mellitus without complication (HCC)    Dyslipidemia    Emphysema (subcutaneous) (surgical) resulting from a procedure    Hyperlipemia    Hypertension    Mitral regurgitation    Partial tear of rotator cuff    right shoulder   Peripheral vascular disease (HCC)    Persistent atrial fibrillation (HCC)    Pulmonary hypertension (HCC)    Stroke [facility])    Wears dentures    Past Surgical History:  Procedure Laterality Date   CARDIAC CATHETERIZATION  11/05/2008   CARDIAC CATHETERIZATION  10/22/1993   EF 60%   CARDIOVERSION N/A 06/05/2020   Procedure: CARDIOVERSION;  Surgeon: Vesta Mixer, MD;  Location: [facility] ENDOSCOPY;  Service: Cardiovascular;  Laterality: N/A;   CAROTID ENDARTERECTOMY Right 03/23/2018   CORONARY ANGIOPLASTY WITH STENT PLACEMENT     ENDARTERECTOMY Right 03/23/2018   Procedure: RIGHT CAROTID ARTERY ENDARTERECTOMY;  Surgeon: Maeola Harman, MD;  Location: [facility] OR;  Service: Vascular;  Laterality: Right;   FRACTURE SURGERY     righ fibula and tibia   HERNIA REPAIR  1984   KNEE  ARTHROSCOPY Left    MULTIPLE TOOTH EXTRACTIONS     PATCH ANGIOPLASTY Right 03/23/2018   Procedure: WITH Livia Snellen 1CM X 6CM PATCH ANGIOPLASTY;  Surgeon: Maeola Harman, MD;  Location: [facility] OR;  Service: Vascular;  Laterality: Right;   SHOULDER ARTHROSCOPY Right 06/09/2017   Procedure: ARTHROSCOPY SHOULDER SUBACROMIAL DECOMPRESSION AND ACROMIOPLASTY;  Surgeon: Marcene Corning, MD;  Location: [facility] OR;  Service: Orthopedics;  Laterality: Right;  GENERAL ANESTHESIA WITH BLOCK   Patient Active Problem List   Diagnosis Date Noted   Atrial fibrillation, persistent (HCC) 02/17/2023   CKD (chronic kidney disease) stage 3, GFR 30-59 ml/min (HCC) 02/17/2023   CHF exacerbation (HCC) 09/27/2021   Emphysema/COPD (HCC) 09/27/2021   Type 2 diabetes mellitus with hyperlipidemia (HCC) 09/27/2021   Pulmonary HTN (HCC) 06/03/2021   Persistent atrial fibrillation (HCC)    Carotid stenosis, asymptomatic, right 03/23/2018   Bilateral carotid bruits 10/15/2016   Acute myocardial infarction    Dyslipidemia    Coronary artery disease    Hypertension    Hyperlipemia     ONSET DATE: 04/27/23   REFERRING DIAG:  R13.10 (ICD-10-CM) - Dysphagia, unspecified   THERAPY DIAG:  Dysphagia, unspecified type  Dysarthria and anarthria  Aphasia  Rationale for Evaluation and Treatment: Rehabilitation  SUBJECTIVE:   SUBJECTIVE STATEMENT: Pt reports daily HEP completion. Reports usual globus with eating/drinking, continues to experience SOB.   PERTINENT HISTORY:   Patient complains of Dysphagia patient is awaiting the speech evaluation scheduled according to the daughter next week. He had had pneumonia probably aspiration pneumonia he is getting frail and weakness probably having swallowing muscle weakness.  Difficulty in coughing even with liquids and solid as per the daughter.          His other issue is constant nasal drip and drainage specially worse with eating more rhinitis that also is making things  worse.   PAIN:  Are you having pain? No  FALLS: Has patient fallen in last 6 months?  Yes  LIVING ENVIRONMENT: Lives with: lives with their family and lives with their daughter Lives in: House/apartment  PLOF:  Level of assistance: Independent with ADLs, Independent with IADLs Employment: Retired  PATIENT GOALS: "get Engineer, manufacturing systems.   OBJECTIVE:   PATIENT REPORTED OUTCOME MEASURES (PROM): EAT-10: 36/40    TODAY'S TREATMENT:  07/19/23: Revived HEP for dysaphia and dysarthria. Adjusted to increase intensity.  Swallow tx: Led pt through effortful swallows with thin liquid bolus. Pt completed 15 repetitions with mod-I. Delayed throat clear x2. Pt tells SLP he is clearing throat often when eating or drinking. Reviewed aspiration precautions with pt verbalizing understanding.   Dysarthria tx: Addressed employment of dysarthria strategies in sentence generation task. Pt intelligible on initial attempt in 85% of trials. Subsequent attempts of same sentence with some improved clarity noted. Pt with improving use of over-articulation and pacing resulting in improved productions. Pt endorses improved communication outside fo therapy sessions with use of targeted strategies. Tells SLP he thinks his speech is sounding better.   07/12/23: Targeted dysphagia through EMST, patient was able to complete 2 reps 5 during today's session. Patient demonstrated 20 hard swallows during today's session with min-I and two coughs to clear. Targeted dysarthria through reading of functional phrases with ~60% inteilltbility. These phrases have been practiced during HEP. Unstructured conversation decreases intelligibility  to ~30% with frequent mod-A reminders to slow down and give semantic/phonemic cues to the listener. Today's session focused on /s/ words with frequent mod-A of models, immediate feedback, and education on providing gestures to the listener to aid in comprehension. HEP to continue EMST, hard  swallows, and practicing words with /s/ sounds focusing on clarity, and taking time.   07/08/23: Patient has been implementing HEP of EMST at home 60% of the time. He was able to complete 3 reps or 5 EMST during the session and stated that it was easy and the device was increased. Patient's intelligibility ~40% on unstructured phrase/sentence, level. Education provided on taking time while speaking, and waiting for the listener to provide feedback/comprehension. SLP targeted dysathria through functional phrases, pt required frequent mod-A of frequent feedback, models, semantic cues, and reminders to take deep breaths before speaking. Patient was able to rate his intelligibility on a scale of 1 (not clear), 2 (can be improved), 3 (clear). Patient's perception of intelligibly matched the SLP's opinion, and was able to increase intelligibility with additional practice, deep breaths, and taking his time. SLP recorded pt's voice in order to provide biofeedback. Pt benefits from breaking words/phrases into smaller syllable/break and reminders to breathe.   PATIENT EDUCATION: Education details: See above Person educated: Patient and Child(ren) Education method: Explanation Education comprehension: verbalized understanding   ASSESSMENT:  CLINICAL IMPRESSION: Ongoing education and training for use of EMST to support dysphagia rehabilitation  and dysarthria compensations at phrase level. Pts dysarthria improves when provided with guidance on syllabification and reminders to breathe and take time while speaking. Pt requires usual cues but is achieving intelligible productions more consistantly during structured tasks. Pt continues to benefit from skilled ST.   OBJECTIVE IMPAIRMENTS: include voice disorder and dysphagia. These impairments are limiting patient from safety when swallowing. Factors affecting potential to achieve goals and functional outcome are previous level of function. Patient will benefit from  skilled SLP services to address above impairments and improve overall function.  REHAB POTENTIAL: Good   GOALS: Goals reviewed with patient? Yes  SHORT TERM GOALS: Target date: 06/16/2023  Pt will participate in MBSS Baseline: Goal status: MET  2.  Pt will teach back and demonstrate use of swallow strategies and targeted swallow exercises with 100% accuracy, with use of visual aid PRN Baseline:  Goal status: MET  3.  Pt will demonstrate completion of x4 potential aphasia HEP activities with mod-A from SLP  Baseline:  Goal status: MET  4.  Pt will report HEP for dysphagia/aphasia completion 5/7 days over 1 week period Baseline:  Goal status: NOT MET   LONG TERM GOALS: Target date: 08/11/2023  Pt will report successful completion of HEP 5/7 days over 2 week period with support from daughter Baseline:  Goal status: MET   2.  Pt will complete structured language tasks with 80% accuracy given occasional mod-A Baseline:  Goal status: IN PROGRESS  3.  Pt will report improvement via EAT-10 by dc Baseline:  Goal status: IN PROGRESS  4.  Pt will complete f/u MBSS, if indicated  Baseline:  Goal status: MET  PLAN:  SLP FREQUENCY: 2x/week  SLP DURATION: 12 weeks  PLANNED INTERVENTIONS: Aspiration precaution training, Pharyngeal strengthening exercises, Diet toleration management , Language facilitation, Trials of upgraded texture/liquids, Cueing hierachy, Functional tasks, Multimodal communication approach, SLP instruction and feedback, Compensatory strategies, Patient/family education, and Re-evaluation  Maia Breslow, CCC-SLP 07/19/2023, 10:16 AM

## 2023-07-21 ENCOUNTER — Ambulatory Visit: Payer: Medicare Other | Admitting: Speech Pathology

## 2023-07-21 NOTE — Therapy (Deleted)
OUTPATIENT SPEECH LANGUAGE PATHOLOGY TREATMENT   Patient Name: Todd Armstrong MRN: [id number] DOB:1930-12-22, 89 y.o., male Today's Date: 07/21/2023  PCP: Emilio Aspen, MD REFERRING PROVIDER: Georgann Housekeeper, MD  END OF SESSION:     Past Medical History:  Diagnosis Date   Acute myocardial infarction    Anemia    Aortic insufficiency    Carotid stenosis    Chronic combined systolic and diastolic CHF (congestive heart failure) (HCC)    Coronary artery disease    PTCA and stenting of his right coronary artery. 3.5 x 16-mm Liberte stent.               Diabetes mellitus without complication (HCC)    Dyslipidemia    Emphysema (subcutaneous) (surgical) resulting from a procedure    Hyperlipemia    Hypertension    Mitral regurgitation    Partial tear of rotator cuff    right shoulder   Peripheral vascular disease (HCC)    Persistent atrial fibrillation (HCC)    Pulmonary hypertension (HCC)    Stroke [facility])    Wears dentures    Past Surgical History:  Procedure Laterality Date   CARDIAC CATHETERIZATION  11/05/2008   CARDIAC CATHETERIZATION  10/22/1993   EF 60%   CARDIOVERSION N/A 06/05/2020   Procedure: CARDIOVERSION;  Surgeon: Vesta Mixer, MD;  Location: [facility] ENDOSCOPY;  Service: Cardiovascular;  Laterality: N/A;   CAROTID ENDARTERECTOMY Right 03/23/2018   CORONARY ANGIOPLASTY WITH STENT PLACEMENT     ENDARTERECTOMY Right 03/23/2018   Procedure: RIGHT CAROTID ARTERY ENDARTERECTOMY;  Surgeon: Maeola Harman, MD;  Location: [facility] OR;  Service: Vascular;  Laterality: Right;   FRACTURE SURGERY     righ fibula and tibia   HERNIA REPAIR  1984   KNEE ARTHROSCOPY Left    MULTIPLE TOOTH EXTRACTIONS     PATCH ANGIOPLASTY Right 03/23/2018   Procedure: WITH Livia Snellen 1CM X 6CM PATCH ANGIOPLASTY;  Surgeon: Maeola Harman, MD;  Location: [facility] OR;  Service: Vascular;  Laterality: Right;   SHOULDER ARTHROSCOPY Right 06/09/2017   Procedure: ARTHROSCOPY SHOULDER  SUBACROMIAL DECOMPRESSION AND ACROMIOPLASTY;  Surgeon: Marcene Corning, MD;  Location: [facility] OR;  Service: Orthopedics;  Laterality: Right;  GENERAL ANESTHESIA WITH BLOCK   Patient Active Problem List   Diagnosis Date Noted   Atrial fibrillation, persistent (HCC) 02/17/2023   CKD (chronic kidney disease) stage 3, GFR 30-59 ml/min (HCC) 02/17/2023   CHF exacerbation (HCC) 09/27/2021   Emphysema/COPD (HCC) 09/27/2021   Type 2 diabetes mellitus with hyperlipidemia (HCC) 09/27/2021   Pulmonary HTN (HCC) 06/03/2021   Persistent atrial fibrillation (HCC)    Carotid stenosis, asymptomatic, right 03/23/2018   Bilateral carotid bruits 10/15/2016   Acute myocardial infarction    Dyslipidemia    Coronary artery disease    Hypertension    Hyperlipemia     ONSET DATE: 04/27/23   REFERRING DIAG:  R13.10 (ICD-10-CM) - Dysphagia, unspecified   THERAPY DIAG:  No diagnosis found.  Rationale for Evaluation and Treatment: Rehabilitation  SUBJECTIVE:   SUBJECTIVE STATEMENT: ***  PERTINENT HISTORY:   Patient complains of Dysphagia patient is awaiting the speech evaluation scheduled according to the daughter next week. He had had pneumonia probably aspiration pneumonia he is getting frail and weakness probably having swallowing muscle weakness.  Difficulty in coughing even with liquids and solid as per the daughter.          His other issue is constant nasal drip and drainage specially worse with eating more rhinitis that also  is making things worse.   PAIN:  Are you having pain? No  FALLS: Has patient fallen in last 6 months?  Yes  LIVING ENVIRONMENT: Lives with: lives with their family and lives with their daughter Lives in: House/apartment  PLOF:  Level of assistance: Independent with ADLs, Independent with IADLs Employment: Retired  PATIENT GOALS: "get Engineer, manufacturing systems.   OBJECTIVE:   PATIENT REPORTED OUTCOME MEASURES (PROM): EAT-10: 36/40    TODAY'S TREATMENT:  07/21/23:  ***  07/19/23: Revived HEP for dysaphia and dysarthria. Adjusted to increase intensity.  Swallow tx: Led pt through effortful swallows with thin liquid bolus. Pt completed 15 repetitions with mod-I. Delayed throat clear x2. Pt tells SLP he is clearing throat often when eating or drinking. Reviewed aspiration precautions with pt verbalizing understanding.   Dysarthria tx: Addressed employment of dysarthria strategies in sentence generation task. Pt intelligible on initial attempt in 85% of trials. Subsequent attempts of same sentence with some improved clarity noted. Pt with improving use of over-articulation and pacing resulting in improved productions. Pt endorses improved communication outside fo therapy sessions with use of targeted strategies. Tells SLP he thinks his speech is sounding better.   07/12/23: Targeted dysphagia through EMST, patient was able to complete 2 reps 5 during today's session. Patient demonstrated 20 hard swallows during today's session with min-I and two coughs to clear. Targeted dysarthria through reading of functional phrases with ~60% inteilltbility. These phrases have been practiced during HEP. Unstructured conversation decreases intelligibility  to ~30% with frequent mod-A reminders to slow down and give semantic/phonemic cues to the listener. Today's session focused on /s/ words with frequent mod-A of models, immediate feedback, and education on providing gestures to the listener to aid in comprehension. HEP to continue EMST, hard swallows, and practicing words with /s/ sounds focusing on clarity, and taking time.   07/08/23: Patient has been implementing HEP of EMST at home 60% of the time. He was able to complete 3 reps or 5 EMST during the session and stated that it was easy and the device was increased. Patient's intelligibility ~40% on unstructured phrase/sentence, level. Education provided on taking time while speaking, and waiting for the listener to provide  feedback/comprehension. SLP targeted dysathria through functional phrases, pt required frequent mod-A of frequent feedback, models, semantic cues, and reminders to take deep breaths before speaking. Patient was able to rate his intelligibility on a scale of 1 (not clear), 2 (can be improved), 3 (clear). Patient's perception of intelligibly matched the SLP's opinion, and was able to increase intelligibility with additional practice, deep breaths, and taking his time. SLP recorded pt's voice in order to provide biofeedback. Pt benefits from breaking words/phrases into smaller syllable/break and reminders to breathe.   PATIENT EDUCATION: Education details: See above Person educated: Patient and Child(ren) Education method: Explanation Education comprehension: verbalized understanding   ASSESSMENT:  CLINICAL IMPRESSION: Ongoing education and training for use of EMST to support dysphagia rehabilitation and dysarthria compensations at phrase level. Pts dysarthria improves when provided with guidance on syllabification and reminders to breathe and take time while speaking. Pt requires usual cues but is achieving intelligible productions more consistantly during structured tasks. Pt continues to benefit from skilled ST.   OBJECTIVE IMPAIRMENTS: include voice disorder and dysphagia. These impairments are limiting patient from safety when swallowing. Factors affecting potential to achieve goals and functional outcome are previous level of function. Patient will benefit from skilled SLP services to address above impairments and improve overall function.  REHAB POTENTIAL: Good  GOALS: Goals reviewed with patient? Yes  SHORT TERM GOALS: Target date: 06/16/2023  Pt will participate in MBSS Baseline: Goal status: MET  2.  Pt will teach back and demonstrate use of swallow strategies and targeted swallow exercises with 100% accuracy, with use of visual aid PRN Baseline:  Goal status: MET  3.  Pt will  demonstrate completion of x4 potential aphasia HEP activities with mod-A from SLP  Baseline:  Goal status: MET  4.  Pt will report HEP for dysphagia/aphasia completion 5/7 days over 1 week period Baseline:  Goal status: NOT MET   LONG TERM GOALS: Target date: 08/11/2023  Pt will report successful completion of HEP 5/7 days over 2 week period with support from daughter Baseline:  Goal status: MET   2.  Pt will complete structured language tasks with 80% accuracy given occasional mod-A Baseline:  Goal status: IN PROGRESS  3.  Pt will report improvement via EAT-10 by dc Baseline:  Goal status: IN PROGRESS  4.  Pt will complete f/u MBSS, if indicated  Baseline:  Goal status: MET  PLAN:  SLP FREQUENCY: 2x/week  SLP DURATION: 12 weeks  PLANNED INTERVENTIONS: Aspiration precaution training, Pharyngeal strengthening exercises, Diet toleration management , Language facilitation, Trials of upgraded texture/liquids, Cueing hierachy, Functional tasks, Multimodal communication approach, SLP instruction and feedback, Compensatory strategies, Patient/family education, and Re-evaluation  Maia Breslow, CCC-SLP 07/21/2023, 6:49 AM

## 2023-07-22 ENCOUNTER — Encounter: Payer: Medicare Other | Admitting: Speech Pathology

## 2023-07-25 NOTE — Therapy (Unsigned)
OUTPATIENT SPEECH LANGUAGE PATHOLOGY TREATMENT   Patient Name: Todd Armstrong MRN: [id number] DOB:03/22/1931, 89 y.o., male Today's Date: 07/26/2023  PCP: Emilio Aspen, MD REFERRING PROVIDER: Georgann Housekeeper, MD  END OF SESSION:  End of Session - 07/26/23 0937     Visit Number 13    Number of Visits 25    Date for SLP Re-Evaluation 08/11/23    SLP Start Time 0938    SLP Stop Time  1030    SLP Time Calculation (min) 52 min    Activity Tolerance Patient tolerated treatment well               Past Medical History:  Diagnosis Date   Acute myocardial infarction    Anemia    Aortic insufficiency    Carotid stenosis    Chronic combined systolic and diastolic CHF (congestive heart failure) (HCC)    Coronary artery disease    PTCA and stenting of his right coronary artery. 3.5 x 16-mm Liberte stent.               Diabetes mellitus without complication (HCC)    Dyslipidemia    Emphysema (subcutaneous) (surgical) resulting from a procedure    Hyperlipemia    Hypertension    Mitral regurgitation    Partial tear of rotator cuff    right shoulder   Peripheral vascular disease (HCC)    Persistent atrial fibrillation (HCC)    Pulmonary hypertension (HCC)    Stroke [facility])    Wears dentures    Past Surgical History:  Procedure Laterality Date   CARDIAC CATHETERIZATION  11/05/2008   CARDIAC CATHETERIZATION  10/22/1993   EF 60%   CARDIOVERSION N/A 06/05/2020   Procedure: CARDIOVERSION;  Surgeon: Vesta Mixer, MD;  Location: [facility] ENDOSCOPY;  Service: Cardiovascular;  Laterality: N/A;   CAROTID ENDARTERECTOMY Right 03/23/2018   CORONARY ANGIOPLASTY WITH STENT PLACEMENT     ENDARTERECTOMY Right 03/23/2018   Procedure: RIGHT CAROTID ARTERY ENDARTERECTOMY;  Surgeon: Maeola Harman, MD;  Location: [facility] OR;  Service: Vascular;  Laterality: Right;   FRACTURE SURGERY     righ fibula and tibia   HERNIA REPAIR  1984   KNEE ARTHROSCOPY Left    MULTIPLE TOOTH  EXTRACTIONS     PATCH ANGIOPLASTY Right 03/23/2018   Procedure: WITH Livia Snellen 1CM X 6CM PATCH ANGIOPLASTY;  Surgeon: Maeola Harman, MD;  Location: [facility] OR;  Service: Vascular;  Laterality: Right;   SHOULDER ARTHROSCOPY Right 06/09/2017   Procedure: ARTHROSCOPY SHOULDER SUBACROMIAL DECOMPRESSION AND ACROMIOPLASTY;  Surgeon: Marcene Corning, MD;  Location: [facility] OR;  Service: Orthopedics;  Laterality: Right;  GENERAL ANESTHESIA WITH BLOCK   Patient Active Problem List   Diagnosis Date Noted   Atrial fibrillation, persistent (HCC) 02/17/2023   CKD (chronic kidney disease) stage 3, GFR 30-59 ml/min (HCC) 02/17/2023   CHF exacerbation (HCC) 09/27/2021   Emphysema/COPD (HCC) 09/27/2021   Type 2 diabetes mellitus with hyperlipidemia (HCC) 09/27/2021   Pulmonary HTN (HCC) 06/03/2021   Persistent atrial fibrillation (HCC)    Carotid stenosis, asymptomatic, right 03/23/2018   Bilateral carotid bruits 10/15/2016   Acute myocardial infarction    Dyslipidemia    Coronary artery disease    Hypertension    Hyperlipemia     ONSET DATE: 04/27/23   REFERRING DIAG:  R13.10 (ICD-10-CM) - Dysphagia, unspecified   THERAPY DIAG:  Dysphagia, unspecified type  Dysarthria and anarthria  Rationale for Evaluation and Treatment: Rehabilitation  SUBJECTIVE:   SUBJECTIVE STATEMENT: Showed SLP swallow exercise  chart with reps completed. Required cues to verbalize. Stated speech is "better"  PERTINENT HISTORY:   Patient complains of Dysphagia patient is awaiting the speech evaluation scheduled according to the daughter next week. He had had pneumonia probably aspiration pneumonia he is getting frail and weakness probably having swallowing muscle weakness.  Difficulty in coughing even with liquids and solid as per the daughter.          His other issue is constant nasal drip and drainage specially worse with eating more rhinitis that also is making things worse.   PAIN:  Are you having pain?  No  FALLS: Has patient fallen in last 6 months?  Yes  LIVING ENVIRONMENT: Lives with: lives with their family and lives with their daughter Lives in: House/apartment  PLOF:  Level of assistance: Independent with ADLs, Independent with IADLs Employment: Retired  PATIENT GOALS: "get Engineer, manufacturing systems.   OBJECTIVE:   PATIENT REPORTED OUTCOME MEASURES (PROM): EAT-10: 36/40    TODAY'S TREATMENT:  07/25/23: Continues to complete HEP for dysphagia and dysarthria with exercise chart provided. Daughter briefly entered with questions for primary SLP re: additional ST visits and scheduling repeat swallow study. Denied additional questions for current SLP.   Dysarthria tx: With new listener, pt's speech rated ~20% intelligible. Trialed use of writing to augment verbal expression, which aided carryover of slow rate and syllable segmentation during oral reading. Suggested using scripting as well as writing at home/in community to augment verbal expression and for functional practice of trained dysarthria strategies. SLP observed usual throat clearing in absence of PO, with pt reporting thick secretions with some expectoration at home. Educated pt on throat clear alternatives (outside of PO intake) for vocal hygiene, with occasional min A to carryover throughout remainder of session. Provided handout with recommendations.   Swallow tx: Completed effortful swallows x15 with rare min throat clears x2. Pt endorsed concern for PNA, with pt reporting coughing up to 2 hours after meals. Educated 3 pillars of aspiration PNA, with pt endorsing thorough oral care 2x/day and daily ambulation, which are positive factors. Pt reported dry/sore throat after 20 reps of EMST 150. During pt led demo, pt noted with poor labial seal and prolonged exhalation resulted in device exiting oral cavity each trial. SLP provided accurate demonstration of short, strong exhale with lips sealed, with pt exhibiting difficulty  executing despite model and cues. Additional education warranted next session.   07/19/23: Revived HEP for dysaphia and dysarthria. Adjusted to increase intensity.  Swallow tx: Led pt through effortful swallows with thin liquid bolus. Pt completed 15 repetitions with mod-I. Delayed throat clear x2. Pt tells SLP he is clearing throat often when eating or drinking. Reviewed aspiration precautions with pt verbalizing understanding.   Dysarthria tx: Addressed employment of dysarthria strategies in sentence generation task. Pt intelligible on initial attempt in 85% of trials. Subsequent attempts of same sentence with some improved clarity noted. Pt with improving use of over-articulation and pacing resulting in improved productions. Pt endorses improved communication outside fo therapy sessions with use of targeted strategies. Tells SLP he thinks his speech is sounding better.   07/12/23: Targeted dysphagia through EMST, patient was able to complete 2 reps 5 during today's session. Patient demonstrated 20 hard swallows during today's session with min-I and two coughs to clear. Targeted dysarthria through reading of functional phrases with ~60% inteilltbility. These phrases have been practiced during HEP. Unstructured conversation decreases intelligibility  to ~30% with frequent mod-A reminders to slow down and give semantic/phonemic  cues to the listener. Today's session focused on /s/ words with frequent mod-A of models, immediate feedback, and education on providing gestures to the listener to aid in comprehension. HEP to continue EMST, hard swallows, and practicing words with /s/ sounds focusing on clarity, and taking time.   07/08/23: Patient has been implementing HEP of EMST at home 60% of the time. He was able to complete 3 reps or 5 EMST during the session and stated that it was easy and the device was increased. Patient's intelligibility ~40% on unstructured phrase/sentence, level. Education provided on  taking time while speaking, and waiting for the listener to provide feedback/comprehension. SLP targeted dysathria through functional phrases, pt required frequent mod-A of frequent feedback, models, semantic cues, and reminders to take deep breaths before speaking. Patient was able to rate his intelligibility on a scale of 1 (not clear), 2 (can be improved), 3 (clear). Patient's perception of intelligibly matched the SLP's opinion, and was able to increase intelligibility with additional practice, deep breaths, and taking his time. SLP recorded pt's voice in order to provide biofeedback. Pt benefits from breaking words/phrases into smaller syllable/break and reminders to breathe.   PATIENT EDUCATION: Education details: See above Person educated: Patient and Child(ren) Education method: Explanation Education comprehension: verbalized understanding   ASSESSMENT:  CLINICAL IMPRESSION: Ongoing education and training of targeted swallow exercises and use of EMST to support dysphagia rehabilitation and dysarthria compensations at phrase level. Pts dysarthria improves when provided with guidance on syllabification and reminders to breathe and take time while speaking. Pt requires usual cues but is achieving intelligible productions more consistantly during structured tasks. Pt continues to benefit from skilled ST.   OBJECTIVE IMPAIRMENTS: include voice disorder and dysphagia. These impairments are limiting patient from safety when swallowing. Factors affecting potential to achieve goals and functional outcome are previous level of function. Patient will benefit from skilled SLP services to address above impairments and improve overall function.  REHAB POTENTIAL: Good   GOALS: Goals reviewed with patient? Yes  SHORT TERM GOALS: Target date: 06/16/2023  Pt will participate in MBSS Baseline: Goal status: MET  2.  Pt will teach back and demonstrate use of swallow strategies and targeted swallow  exercises with 100% accuracy, with use of visual aid PRN Baseline:  Goal status: MET  3.  Pt will demonstrate completion of x4 potential aphasia HEP activities with mod-A from SLP  Baseline:  Goal status: MET  4.  Pt will report HEP for dysphagia/aphasia completion 5/7 days over 1 week period Baseline:  Goal status: NOT MET   LONG TERM GOALS: Target date: 08/11/2023  Pt will report successful completion of HEP 5/7 days over 2 week period with support from daughter Baseline:  Goal status: MET   2.  Pt will complete structured language tasks with 80% accuracy given occasional mod-A Baseline:  Goal status: IN PROGRESS  3.  Pt will report improvement via EAT-10 by dc Baseline:  Goal status: IN PROGRESS  4.  Pt will complete f/u MBSS, if indicated  Baseline:  Goal status: MET  PLAN:  SLP FREQUENCY: 2x/week  SLP DURATION: 12 weeks  PLANNED INTERVENTIONS: Aspiration precaution training, Pharyngeal strengthening exercises, Diet toleration management , Language facilitation, Trials of upgraded texture/liquids, Cueing hierachy, Functional tasks, Multimodal communication approach, SLP instruction and feedback, Compensatory strategies, Patient/family education, and Re-evaluation  RONTRELL MIRO, CCC-SLP 07/26/2023, 10:44 AM

## 2023-07-26 ENCOUNTER — Ambulatory Visit: Payer: Medicare Other

## 2023-07-26 DIAGNOSIS — R1310 Dysphagia, unspecified: Secondary | ICD-10-CM

## 2023-07-26 DIAGNOSIS — R471 Dysarthria and anarthria: Secondary | ICD-10-CM

## 2023-07-26 NOTE — Patient Instructions (Addendum)
Keep up your practice - you're doing great!  Speech suggestions:  I suggest a white board/paper & pen to supplement your talking. This does not take the place of talking. Practice reading aloud what you wrote. Say each and every syllable, especially the syllables on the end   Write out what you want to say before events (i.e., doctor's appointment) and practice before/bring with you   Swallow suggestions:  Instead of spitting out your phlegm AND clearing your throat throughout the day (in absence of food/drink), swallow your saliva hard and/or take a effortful sip of water   To reduce risk of pneumonia, make sure you are performing thorough oral care 2-3x/day and staying physically active (as able)   For EMST 150, make sure your lips stayed sealed around the device. Big breath in through mouth. It should be a short, strong breath out (like a bike pump noise) with lips sealed around the device

## 2023-07-28 ENCOUNTER — Ambulatory Visit: Payer: Medicare Other | Admitting: Speech Pathology

## 2023-07-28 DIAGNOSIS — R4701 Aphasia: Secondary | ICD-10-CM

## 2023-07-28 DIAGNOSIS — R1310 Dysphagia, unspecified: Secondary | ICD-10-CM

## 2023-07-28 DIAGNOSIS — R471 Dysarthria and anarthria: Secondary | ICD-10-CM

## 2023-07-28 NOTE — Therapy (Signed)
OUTPATIENT SPEECH LANGUAGE PATHOLOGY TREATMENT   Patient Name: Todd Armstrong MRN: [id number] DOB:March 25, 1931, 89 y.o., male Today's Date: 07/28/2023  PCP: Emilio Aspen, MD REFERRING PROVIDER: Georgann Housekeeper, MD  END OF SESSION:  End of Session - 07/28/23 1146     Visit Number 14    Number of Visits 25    Date for SLP Re-Evaluation 08/11/23    SLP Start Time 1146    SLP Stop Time  1230    SLP Time Calculation (min) 44 min    Activity Tolerance Patient tolerated treatment well               Past Medical History:  Diagnosis Date   Acute myocardial infarction    Anemia    Aortic insufficiency    Carotid stenosis    Chronic combined systolic and diastolic CHF (congestive heart failure) (HCC)    Coronary artery disease    PTCA and stenting of his right coronary artery. 3.5 x 16-mm Liberte stent.               Diabetes mellitus without complication (HCC)    Dyslipidemia    Emphysema (subcutaneous) (surgical) resulting from a procedure    Hyperlipemia    Hypertension    Mitral regurgitation    Partial tear of rotator cuff    right shoulder   Peripheral vascular disease (HCC)    Persistent atrial fibrillation (HCC)    Pulmonary hypertension (HCC)    Stroke  [facility])    Wears dentures    Past Surgical History:  Procedure Laterality Date   CARDIAC CATHETERIZATION  11/05/2008   CARDIAC CATHETERIZATION  10/22/1993   EF 60%   CARDIOVERSION N/A 06/05/2020   Procedure: CARDIOVERSION;  Surgeon: Vesta Mixer, MD;  Location: [facility] ENDOSCOPY;  Service: Cardiovascular;  Laterality: N/A;   CAROTID ENDARTERECTOMY Right 03/23/2018   CORONARY ANGIOPLASTY WITH STENT PLACEMENT     ENDARTERECTOMY Right 03/23/2018   Procedure: RIGHT CAROTID ARTERY ENDARTERECTOMY;  Surgeon: Maeola Harman, MD;  Location: [facility] OR;  Service: Vascular;  Laterality: Right;   FRACTURE SURGERY     righ fibula and tibia   HERNIA REPAIR  1984   KNEE ARTHROSCOPY Left    MULTIPLE TOOTH  EXTRACTIONS     PATCH ANGIOPLASTY Right 03/23/2018   Procedure: WITH Livia Snellen 1CM X 6CM PATCH ANGIOPLASTY;  Surgeon: Maeola Harman, MD;  Location: [facility] OR;  Service: Vascular;  Laterality: Right;   SHOULDER ARTHROSCOPY Right 06/09/2017   Procedure: ARTHROSCOPY SHOULDER SUBACROMIAL DECOMPRESSION AND ACROMIOPLASTY;  Surgeon: Marcene Corning, MD;  Location: [facility] OR;  Service: Orthopedics;  Laterality: Right;  GENERAL ANESTHESIA WITH BLOCK   Patient Active Problem List   Diagnosis Date Noted   Atrial fibrillation, persistent (HCC) 02/17/2023   CKD (chronic kidney disease) stage 3, GFR 30-59 ml/min (HCC) 02/17/2023   CHF exacerbation (HCC) 09/27/2021   Emphysema/COPD (HCC) 09/27/2021   Type 2 diabetes mellitus with hyperlipidemia (HCC) 09/27/2021   Pulmonary HTN (HCC) 06/03/2021   Persistent atrial fibrillation (HCC)    Carotid stenosis, asymptomatic, right 03/23/2018   Bilateral carotid bruits 10/15/2016   Acute myocardial infarction    Dyslipidemia    Coronary artery disease    Hypertension    Hyperlipemia     ONSET DATE: 04/27/23   REFERRING DIAG:  R13.10 (ICD-10-CM) - Dysphagia, unspecified   THERAPY DIAG:  Dysphagia, unspecified type  Dysarthria and anarthria  Aphasia  Rationale for Evaluation and Treatment: Rehabilitation  SUBJECTIVE:   SUBJECTIVE STATEMENT: Pt able  to recall recommendations for throat clear alternatives.   PERTINENT HISTORY:   Patient complains of Dysphagia patient is awaiting the speech evaluation scheduled according to the daughter next week. He had had pneumonia probably aspiration pneumonia he is getting frail and weakness probably having swallowing muscle weakness.  Difficulty in coughing even with liquids and solid as per the daughter.          His other issue is constant nasal drip and drainage specially worse with eating more rhinitis that also is making things worse.   PAIN:  Are you having pain? No  FALLS: Has patient fallen in last 6  months?  Yes  LIVING ENVIRONMENT: Lives with: lives with their family and lives with their daughter Lives in: House/apartment  PLOF:  Level of assistance: Independent with ADLs, Independent with IADLs Employment: Retired  PATIENT GOALS: "get Engineer, manufacturing systems.   OBJECTIVE:   PATIENT REPORTED OUTCOME MEASURES (PROM): EAT-10: 36/40    TODAY'S TREATMENT:  07/28/23:  Swallow tx: reviewed HEP and adjusted to maximize benefit. Pt requests f/u MBSS to assess if improvements have been made with daily adherence to prescribed HEP. Pt demonstrates effortful swallow x5 and EMST x10 with usual verbal cues to enhance quality of repetitions. Continues to evidence challenges with proper execution but is using forceful exhale and demonstrating ability to release pressure of device.   Dysarthria tx: addressed employment of dysarthria strategies in conversational speech. Pt demonstrating slow rate and syllabification with mod-I. Occasional benefit from written aid to enhance production. Practiced using short phrases and verification of understanding from communication partner to ensure intelligibility across expanded utterances.   07/25/23: Continues to complete HEP for dysphagia and dysarthria with exercise chart provided. Daughter briefly entered with questions for primary SLP re: additional ST visits and scheduling repeat swallow study. Denied additional questions for current SLP.   Dysarthria tx: With new listener, pt's speech rated ~20% intelligible. Trialed use of writing to augment verbal expression, which aided carryover of slow rate and syllable segmentation during oral reading. Suggested using scripting as well as writing at home/in community to augment verbal expression and for functional practice of trained dysarthria strategies. SLP observed usual throat clearing in absence of PO, with pt reporting thick secretions with some expectoration at home. Educated pt on throat clear alternatives  (outside of PO intake) for vocal hygiene, with occasional min A to carryover throughout remainder of session. Provided handout with recommendations.   Swallow tx: Completed effortful swallows x15 with rare min throat clears x2. Pt endorsed concern for PNA, with pt reporting coughing up to 2 hours after meals. Educated 3 pillars of aspiration PNA, with pt endorsing thorough oral care 2x/day and daily ambulation, which are positive factors. Pt reported dry/sore throat after 20 reps of EMST 150. During pt led demo, pt noted with poor labial seal and prolonged exhalation resulted in device exiting oral cavity each trial. SLP provided accurate demonstration of short, strong exhale with lips sealed, with pt exhibiting difficulty executing despite model and cues. Additional education warranted next session.   07/19/23: Revived HEP for dysaphia and dysarthria. Adjusted to increase intensity.  Swallow tx: Led pt through effortful swallows with thin liquid bolus. Pt completed 15 repetitions with mod-I. Delayed throat clear x2. Pt tells SLP he is clearing throat often when eating or drinking. Reviewed aspiration precautions with pt verbalizing understanding.   Dysarthria tx: Addressed employment of dysarthria strategies in sentence generation task. Pt intelligible on initial attempt in 85% of trials. Subsequent attempts of  same sentence with some improved clarity noted. Pt with improving use of over-articulation and pacing resulting in improved productions. Pt endorses improved communication outside fo therapy sessions with use of targeted strategies. Tells SLP he thinks his speech is sounding better.   07/12/23: Targeted dysphagia through EMST, patient was able to complete 2 reps 5 during today's session. Patient demonstrated 20 hard swallows during today's session with min-I and two coughs to clear. Targeted dysarthria through reading of functional phrases with ~60% inteilltbility. These phrases have been practiced  during HEP. Unstructured conversation decreases intelligibility  to ~30% with frequent mod-A reminders to slow down and give semantic/phonemic cues to the listener. Today's session focused on /s/ words with frequent mod-A of models, immediate feedback, and education on providing gestures to the listener to aid in comprehension. HEP to continue EMST, hard swallows, and practicing words with /s/ sounds focusing on clarity, and taking time.   07/08/23: Patient has been implementing HEP of EMST at home 60% of the time. He was able to complete 3 reps or 5 EMST during the session and stated that it was easy and the device was increased. Patient's intelligibility ~40% on unstructured phrase/sentence, level. Education provided on taking time while speaking, and waiting for the listener to provide feedback/comprehension. SLP targeted dysathria through functional phrases, pt required frequent mod-A of frequent feedback, models, semantic cues, and reminders to take deep breaths before speaking. Patient was able to rate his intelligibility on a scale of 1 (not clear), 2 (can be improved), 3 (clear). Patient's perception of intelligibly matched the SLP's opinion, and was able to increase intelligibility with additional practice, deep breaths, and taking his time. SLP recorded pt's voice in order to provide biofeedback. Pt benefits from breaking words/phrases into smaller syllable/break and reminders to breathe.   PATIENT EDUCATION: Education details: See above Person educated: Patient and Child(ren) Education method: Explanation Education comprehension: verbalized understanding   ASSESSMENT:  CLINICAL IMPRESSION: Ongoing education and training of targeted swallow exercises and use of EMST to support dysphagia rehabilitation and dysarthria compensations at phrase level. Pts dysarthria improves when provided with guidance on syllabification and reminders to breathe and take time while speaking. Pt requires usual cues  but is achieving intelligible productions more consistantly during structured tasks. Pt continues to benefit from skilled ST.   OBJECTIVE IMPAIRMENTS: include voice disorder and dysphagia. These impairments are limiting patient from safety when swallowing. Factors affecting potential to achieve goals and functional outcome are previous level of function. Patient will benefit from skilled SLP services to address above impairments and improve overall function.  REHAB POTENTIAL: Good   GOALS: Goals reviewed with patient? Yes  SHORT TERM GOALS: Target date: 06/16/2023  Pt will participate in MBSS Baseline: Goal status: MET  2.  Pt will teach back and demonstrate use of swallow strategies and targeted swallow exercises with 100% accuracy, with use of visual aid PRN Baseline:  Goal status: MET  3.  Pt will demonstrate completion of x4 potential aphasia HEP activities with mod-A from SLP  Baseline:  Goal status: MET  4.  Pt will report HEP for dysphagia/aphasia completion 5/7 days over 1 week period Baseline:  Goal status: NOT MET   LONG TERM GOALS: Target date: 08/11/2023  Pt will report successful completion of HEP 5/7 days over 2 week period with support from daughter Baseline:  Goal status: MET   2.  Pt will complete structured language tasks with 80% accuracy given occasional mod-A Baseline:  Goal status: IN PROGRESS  3.  Pt will report improvement via EAT-10 by dc Baseline:  Goal status: IN PROGRESS  4.  Pt will complete f/u MBSS, if indicated  Baseline:  Goal status: MET  PLAN:  SLP FREQUENCY: 2x/week  SLP DURATION: 12 weeks  PLANNED INTERVENTIONS: Aspiration precaution training, Pharyngeal strengthening exercises, Diet toleration management , Language facilitation, Trials of upgraded texture/liquids, Cueing hierachy, Functional tasks, Multimodal communication approach, SLP instruction and feedback, Compensatory strategies, Patient/family education, and  Re-evaluation  Maia Breslow, CCC-SLP 07/28/2023, 11:46 AM

## 2023-07-28 NOTE — Patient Instructions (Signed)
Managing Solid Textured Food  Eat softer foods Moisten foods - use sauces and gravies  If you're going to eat harder or more dry foods (for example pizza) Take your time Take small bites Chew, chew, and then chew some more Take a drink of water to help wash it down and clear it out   Remember when drinking: smaller sips are best

## 2023-07-29 ENCOUNTER — Other Ambulatory Visit (HOSPITAL_COMMUNITY): Payer: Self-pay | Admitting: *Deleted

## 2023-07-29 ENCOUNTER — Encounter: Payer: Medicare Other | Admitting: Speech Pathology

## 2023-07-29 DIAGNOSIS — R1310 Dysphagia, unspecified: Secondary | ICD-10-CM

## 2023-08-02 ENCOUNTER — Ambulatory Visit: Payer: Medicare Other | Admitting: Speech Pathology

## 2023-08-02 DIAGNOSIS — R1310 Dysphagia, unspecified: Secondary | ICD-10-CM | POA: Diagnosis not present

## 2023-08-02 DIAGNOSIS — R471 Dysarthria and anarthria: Secondary | ICD-10-CM

## 2023-08-02 DIAGNOSIS — R4701 Aphasia: Secondary | ICD-10-CM

## 2023-08-02 NOTE — Therapy (Signed)
OUTPATIENT SPEECH LANGUAGE PATHOLOGY TREATMENT   Patient Name: Todd Armstrong MRN: [id number] DOB:07/10/1931, 89 y.o., male Today's Date: 08/02/2023  PCP: Emilio Aspen, MD REFERRING PROVIDER: Georgann Housekeeper, MD  END OF SESSION:  End of Session - 08/02/23 0835     Visit Number 15    Number of Visits 25    Date for SLP Re-Evaluation 08/11/23    SLP Start Time 0845    SLP Stop Time  0930    SLP Time Calculation (min) 45 min    Activity Tolerance Patient tolerated treatment well                Past Medical History:  Diagnosis Date   Acute myocardial infarction    Anemia    Aortic insufficiency    Carotid stenosis    Chronic combined systolic and diastolic CHF (congestive heart failure) (HCC)    Coronary artery disease    PTCA and stenting of his right coronary artery. 3.5 x 16-mm Liberte stent.               Diabetes mellitus without complication (HCC)    Dyslipidemia    Emphysema (subcutaneous) (surgical) resulting from a procedure    Hyperlipemia    Hypertension    Mitral regurgitation    Partial tear of rotator cuff    right shoulder   Peripheral vascular disease (HCC)    Persistent atrial fibrillation (HCC)    Pulmonary hypertension (HCC)    Stroke [facility])    Wears dentures    Past Surgical History:  Procedure Laterality Date   CARDIAC CATHETERIZATION  11/05/2008   CARDIAC CATHETERIZATION  10/22/1993   EF 60%   CARDIOVERSION N/A 06/05/2020   Procedure: CARDIOVERSION;  Surgeon: Vesta Mixer, MD;  Location: [facility] ENDOSCOPY;  Service: Cardiovascular;  Laterality: N/A;   CAROTID ENDARTERECTOMY Right 03/23/2018   CORONARY ANGIOPLASTY WITH STENT PLACEMENT     ENDARTERECTOMY Right 03/23/2018   Procedure: RIGHT CAROTID ARTERY ENDARTERECTOMY;  Surgeon: Maeola Harman, MD;  Location: [facility] OR;  Service: Vascular;  Laterality: Right;   FRACTURE SURGERY     righ fibula and tibia   HERNIA REPAIR  1984   KNEE ARTHROSCOPY Left    MULTIPLE TOOTH  EXTRACTIONS     PATCH ANGIOPLASTY Right 03/23/2018   Procedure: WITH Livia Snellen 1CM X 6CM PATCH ANGIOPLASTY;  Surgeon: Maeola Harman, MD;  Location: [facility] OR;  Service: Vascular;  Laterality: Right;   SHOULDER ARTHROSCOPY Right 06/09/2017   Procedure: ARTHROSCOPY SHOULDER SUBACROMIAL DECOMPRESSION AND ACROMIOPLASTY;  Surgeon: Marcene Corning, MD;  Location: [facility] OR;  Service: Orthopedics;  Laterality: Right;  GENERAL ANESTHESIA WITH BLOCK   Patient Active Problem List   Diagnosis Date Noted   Atrial fibrillation, persistent (HCC) 02/17/2023   CKD (chronic kidney disease) stage 3, GFR 30-59 ml/min (HCC) 02/17/2023   CHF exacerbation (HCC) 09/27/2021   Emphysema/COPD (HCC) 09/27/2021   Type 2 diabetes mellitus with hyperlipidemia (HCC) 09/27/2021   Pulmonary HTN (HCC) 06/03/2021   Persistent atrial fibrillation (HCC)    Carotid stenosis, asymptomatic, right 03/23/2018   Bilateral carotid bruits 10/15/2016   Acute myocardial infarction    Dyslipidemia    Coronary artery disease    Hypertension    Hyperlipemia     ONSET DATE: 04/27/23   REFERRING DIAG:  R13.10 (ICD-10-CM) - Dysphagia, unspecified   THERAPY DIAG:  Aphasia  Dysarthria and anarthria  Dysphagia, unspecified type  Rationale for Evaluation and Treatment: Rehabilitation  SUBJECTIVE:   SUBJECTIVE STATEMENT: Pt  reports ongoing HEP completion  PERTINENT HISTORY:   Patient complains of Dysphagia patient is awaiting the speech evaluation scheduled according to the daughter next week. He had had pneumonia probably aspiration pneumonia he is getting frail and weakness probably having swallowing muscle weakness.  Difficulty in coughing even with liquids and solid as per the daughter.          His other issue is constant nasal drip and drainage specially worse with eating more rhinitis that also is making things worse.   PAIN:  Are you having pain? No  FALLS: Has patient fallen in last 6 months?  Yes  LIVING  ENVIRONMENT: Lives with: lives with their family and lives with their daughter Lives in: House/apartment  PLOF:  Level of assistance: Independent with ADLs, Independent with IADLs Employment: Retired  PATIENT GOALS: "get Engineer, manufacturing systems.   OBJECTIVE:   PATIENT REPORTED OUTCOME MEASURES (PROM): EAT-10: 36/40    TODAY'S TREATMENT:  08/02/23:  DYSPHAGIA TREATMENT:   Patient directly observed with POs: Yes: dysphagia 1 (puree) and thin liquids  Feeding: able to feed self Liquids provided by: cup Oral phase signs and symptoms:  none Pharyngeal phase signs and symptoms: delayed throat clear x2 (puree) and complaints of globus (x1 puree)  Therapeutic exercises: Effortful Swallow and EMST Types of cueing: verbal Amount of cueing: modified independent Treatment comments: ongoing challenges with correct execution of EMST, though evidencing release of pressure. Pt completed x20 repetitions with pressure set at 58 cmH2O. Completed effortful swallow with thin liquids (x20) and puree (x30)  MOTOR SPEECH TREATMENT: Target use of dysarthria strategies at generative sentence level in response to questions. Intelligibility on initial attempt about 60%. Improved accuracy on subsequent attempt. SLP provided min-A to enhance employment of dysarthria strategies and compensations.    07/28/23:  Swallow tx: reviewed HEP and adjusted to maximize benefit. Pt requests f/u MBSS to assess if improvements have been made with daily adherence to prescribed HEP. Pt demonstrates effortful swallow x5 and EMST x10 with usual verbal cues to enhance quality of repetitions. Continues to evidence challenges with proper execution but is using forceful exhale and demonstrating ability to release pressure of device.   Dysarthria tx: addressed employment of dysarthria strategies in conversational speech. Pt demonstrating slow rate and syllabification with mod-I. Occasional benefit from written aid to enhance  production. Practiced using short phrases and verification of understanding from communication partner to ensure intelligibility across expanded utterances.   PATIENT EDUCATION: Education details: See above Person educated: Patient and Child(ren) Education method: Explanation Education comprehension: verbalized understanding   ASSESSMENT:  CLINICAL IMPRESSION: Ongoing education and training of targeted swallow exercises and use of EMST to support dysphagia rehabilitation and dysarthria compensations at phrase level. Pts dysarthria improves when provided with guidance on syllabification and reminders to breathe and take time while speaking. Pt requires usual cues but is achieving intelligible productions more consistantly during structured tasks. Pt continues to benefit from skilled ST.   OBJECTIVE IMPAIRMENTS: include voice disorder and dysphagia. These impairments are limiting patient from safety when swallowing. Factors affecting potential to achieve goals and functional outcome are previous level of function. Patient will benefit from skilled SLP services to address above impairments and improve overall function.  REHAB POTENTIAL: Good   GOALS: Goals reviewed with patient? Yes  SHORT TERM GOALS: Target date: 06/16/2023  Pt will participate in MBSS Baseline: Goal status: MET  2.  Pt will teach back and demonstrate use of swallow strategies and targeted swallow exercises with 100% accuracy,  with use of visual aid PRN Baseline:  Goal status: MET  3.  Pt will demonstrate completion of x4 potential aphasia HEP activities with mod-A from SLP  Baseline:  Goal status: MET  4.  Pt will report HEP for dysphagia/aphasia completion 5/7 days over 1 week period Baseline:  Goal status: NOT MET   LONG TERM GOALS: Target date: 08/11/2023  Pt will report successful completion of HEP 5/7 days over 2 week period with support from daughter Baseline:  Goal status: MET   2.  Pt will complete  structured language tasks with 80% accuracy given occasional mod-A Baseline:  Goal status: IN PROGRESS  3.  Pt will report improvement via EAT-10 by dc Baseline:  Goal status: IN PROGRESS  4.  Pt will complete f/u MBSS, if indicated  Baseline:  Goal status: MET  PLAN:  SLP FREQUENCY: 2x/week  SLP DURATION: 12 weeks  PLANNED INTERVENTIONS: Aspiration precaution training, Pharyngeal strengthening exercises, Diet toleration management , Language facilitation, Trials of upgraded texture/liquids, Cueing hierachy, Functional tasks, Multimodal communication approach, SLP instruction and feedback, Compensatory strategies, Patient/family education, and Re-evaluation  Maia Breslow, CCC-SLP 08/02/2023, 8:36 AM

## 2023-08-05 ENCOUNTER — Ambulatory Visit: Payer: Medicare Other | Admitting: Speech Pathology

## 2023-08-05 DIAGNOSIS — R471 Dysarthria and anarthria: Secondary | ICD-10-CM

## 2023-08-05 DIAGNOSIS — R1310 Dysphagia, unspecified: Secondary | ICD-10-CM

## 2023-08-05 DIAGNOSIS — R4701 Aphasia: Secondary | ICD-10-CM

## 2023-08-05 NOTE — Therapy (Signed)
OUTPATIENT SPEECH LANGUAGE PATHOLOGY TREATMENT   Patient Name: Todd Armstrong MRN: [id number] DOB:June 12, 1931, 89 y.o., male Today's Date: 08/05/2023  PCP: Emilio Aspen, MD REFERRING PROVIDER: Georgann Housekeeper, MD  END OF SESSION:  End of Session - 08/05/23 1004     Visit Number 16    Number of Visits 25    Date for SLP Re-Evaluation 08/11/23    SLP Start Time 1015    SLP Stop Time  1100    SLP Time Calculation (min) 45 min    Activity Tolerance Patient tolerated treatment well                Past Medical History:  Diagnosis Date   Acute myocardial infarction    Anemia    Aortic insufficiency    Carotid stenosis    Chronic combined systolic and diastolic CHF (congestive heart failure) (HCC)    Coronary artery disease    PTCA and stenting of his right coronary artery. 3.5 x 16-mm Liberte stent.               Diabetes mellitus without complication (HCC)    Dyslipidemia    Emphysema (subcutaneous) (surgical) resulting from a procedure    Hyperlipemia    Hypertension    Mitral regurgitation    Partial tear of rotator cuff    right shoulder   Peripheral vascular disease (HCC)    Persistent atrial fibrillation (HCC)    Pulmonary hypertension (HCC)    Stroke [facility])    Wears dentures    Past Surgical History:  Procedure Laterality Date   CARDIAC CATHETERIZATION  11/05/2008   CARDIAC CATHETERIZATION  10/22/1993   EF 60%   CARDIOVERSION N/A 06/05/2020   Procedure: CARDIOVERSION;  Surgeon: Vesta Mixer, MD;  Location: [facility] ENDOSCOPY;  Service: Cardiovascular;  Laterality: N/A;   CAROTID ENDARTERECTOMY Right 03/23/2018   CORONARY ANGIOPLASTY WITH STENT PLACEMENT     ENDARTERECTOMY Right 03/23/2018   Procedure: RIGHT CAROTID ARTERY ENDARTERECTOMY;  Surgeon: Maeola Harman, MD;  Location: [facility] OR;  Service: Vascular;  Laterality: Right;   FRACTURE SURGERY     righ fibula and tibia   HERNIA REPAIR  1984   KNEE ARTHROSCOPY Left    MULTIPLE TOOTH  EXTRACTIONS     PATCH ANGIOPLASTY Right 03/23/2018   Procedure: WITH Livia Snellen 1CM X 6CM PATCH ANGIOPLASTY;  Surgeon: Maeola Harman, MD;  Location: [facility] OR;  Service: Vascular;  Laterality: Right;   SHOULDER ARTHROSCOPY Right 06/09/2017   Procedure: ARTHROSCOPY SHOULDER SUBACROMIAL DECOMPRESSION AND ACROMIOPLASTY;  Surgeon: Marcene Corning, MD;  Location: [facility] OR;  Service: Orthopedics;  Laterality: Right;  GENERAL ANESTHESIA WITH BLOCK   Patient Active Problem List   Diagnosis Date Noted   Atrial fibrillation, persistent (HCC) 02/17/2023   CKD (chronic kidney disease) stage 3, GFR 30-59 ml/min (HCC) 02/17/2023   CHF exacerbation (HCC) 09/27/2021   Emphysema/COPD (HCC) 09/27/2021   Type 2 diabetes mellitus with hyperlipidemia (HCC) 09/27/2021   Pulmonary HTN (HCC) 06/03/2021   Persistent atrial fibrillation (HCC)    Carotid stenosis, asymptomatic, right 03/23/2018   Bilateral carotid bruits 10/15/2016   Acute myocardial infarction    Dyslipidemia    Coronary artery disease    Hypertension    Hyperlipemia     ONSET DATE: 04/27/23   REFERRING DIAG:  R13.10 (ICD-10-CM) - Dysphagia, unspecified   THERAPY DIAG:  Aphasia  Dysphagia, unspecified type  Dysarthria and anarthria  Rationale for Evaluation and Treatment: Rehabilitation  SUBJECTIVE:   SUBJECTIVE STATEMENT: Ongoing  HEP completion endorses.   PERTINENT HISTORY:   Patient complains of Dysphagia patient is awaiting the speech evaluation scheduled according to the daughter next week. He had had pneumonia probably aspiration pneumonia he is getting frail and weakness probably having swallowing muscle weakness.  Difficulty in coughing even with liquids and solid as per the daughter.          His other issue is constant nasal drip and drainage specially worse with eating more rhinitis that also is making things worse.   PAIN:  Are you having pain? No  FALLS: Has patient fallen in last 6 months?  Yes  LIVING  ENVIRONMENT: Lives with: lives with their family and lives with their daughter Lives in: House/apartment  PLOF:  Level of assistance: Independent with ADLs, Independent with IADLs Employment: Retired  PATIENT GOALS: "get Engineer, manufacturing systems.   OBJECTIVE:   PATIENT REPORTED OUTCOME MEASURES (PROM): EAT-10: 36/40    TODAY'S TREATMENT:  08/05/23: Targeted improved speech intelligibility with use of trained dysarthria compensations. At reading (sentence) level, pt with intelligible production on initial attempt 66% of trials. Sentence generation given word, pt with 60% accuracy of all words initial attempt. Pt with mod-I implementation of spelling and writing as support for communication breakdowns. Reviewed dysphagia strategies for solid PO.   08/02/23:  DYSPHAGIA TREATMENT:   Patient directly observed with POs: Yes: dysphagia 1 (puree) and thin liquids  Feeding: able to feed self Liquids provided by: cup Oral phase signs and symptoms:  none Pharyngeal phase signs and symptoms: delayed throat clear x2 (puree) and complaints of globus (x1 puree)  Therapeutic exercises: Effortful Swallow and EMST Types of cueing: verbal Amount of cueing: modified independent Treatment comments: ongoing challenges with correct execution of EMST, though evidencing release of pressure. Pt completed x20 repetitions with pressure set at 58 cmH2O. Completed effortful swallow with thin liquids (x20) and puree (x30)  MOTOR SPEECH TREATMENT: Target use of dysarthria strategies at generative sentence level in response to questions. Intelligibility on initial attempt about 60%. Improved accuracy on subsequent attempt. SLP provided min-A to enhance employment of dysarthria strategies and compensations.    07/28/23:  Swallow tx: reviewed HEP and adjusted to maximize benefit. Pt requests f/u MBSS to assess if improvements have been made with daily adherence to prescribed HEP. Pt demonstrates effortful swallow x5 and  EMST x10 with usual verbal cues to enhance quality of repetitions. Continues to evidence challenges with proper execution but is using forceful exhale and demonstrating ability to release pressure of device.   Dysarthria tx: addressed employment of dysarthria strategies in conversational speech. Pt demonstrating slow rate and syllabification with mod-I. Occasional benefit from written aid to enhance production. Practiced using short phrases and verification of understanding from communication partner to ensure intelligibility across expanded utterances.   PATIENT EDUCATION: Education details: See above Person educated: Patient and Child(ren) Education method: Explanation Education comprehension: verbalized understanding   ASSESSMENT:  CLINICAL IMPRESSION: Ongoing education and training of targeted swallow exercises and use of EMST to support dysphagia rehabilitation and dysarthria compensations at phrase level. Pts dysarthria improves when provided with guidance on syllabification and reminders to breathe and take time while speaking. Pt requires usual cues but is achieving intelligible productions more consistantly during structured tasks. Pt continues to benefit from skilled ST.   OBJECTIVE IMPAIRMENTS: include voice disorder and dysphagia. These impairments are limiting patient from safety when swallowing. Factors affecting potential to achieve goals and functional outcome are previous level of function. Patient will benefit from skilled  SLP services to address above impairments and improve overall function.  REHAB POTENTIAL: Good   GOALS: Goals reviewed with patient? Yes  SHORT TERM GOALS: Target date: 06/16/2023  Pt will participate in MBSS Baseline: Goal status: MET  2.  Pt will teach back and demonstrate use of swallow strategies and targeted swallow exercises with 100% accuracy, with use of visual aid PRN Baseline:  Goal status: MET  3.  Pt will demonstrate completion of x4  potential aphasia HEP activities with mod-A from SLP  Baseline:  Goal status: MET  4.  Pt will report HEP for dysphagia/aphasia completion 5/7 days over 1 week period Baseline:  Goal status: NOT MET   LONG TERM GOALS: Target date: 08/11/2023  Pt will report successful completion of HEP 5/7 days over 2 week period with support from daughter Baseline:  Goal status: MET   2.  Pt will complete structured language tasks with 80% accuracy given occasional mod-A Baseline:  Goal status: IN PROGRESS  3.  Pt will report improvement via EAT-10 by dc Baseline:  Goal status: IN PROGRESS  4.  Pt will complete f/u MBSS, if indicated  Baseline:  Goal status: MET  PLAN:  SLP FREQUENCY: 2x/week  SLP DURATION: 12 weeks  PLANNED INTERVENTIONS: Aspiration precaution training, Pharyngeal strengthening exercises, Diet toleration management , Language facilitation, Trials of upgraded texture/liquids, Cueing hierachy, Functional tasks, Multimodal communication approach, SLP instruction and feedback, Compensatory strategies, Patient/family education, and Re-evaluation  Maia Breslow, CCC-SLP 08/05/2023, 10:05 AM

## 2023-08-08 ENCOUNTER — Ambulatory Visit (HOSPITAL_COMMUNITY)
Admission: RE | Admit: 2023-08-08 | Discharge: 2023-08-08 | Disposition: A | Discharge status: Home / Self Care | Payer: Medicare Other | Source: Ambulatory Visit | Attending: Internal Medicine | Admitting: Internal Medicine

## 2023-08-08 ENCOUNTER — Ambulatory Visit (HOSPITAL_COMMUNITY)
Admission: RE | Admit: 2023-08-08 | Discharge: 2023-08-08 | Disposition: A | Discharge status: Home / Self Care | Payer: Medicare Other | Source: Ambulatory Visit

## 2023-08-08 DIAGNOSIS — R1312 Dysphagia, oropharyngeal phase: Secondary | ICD-10-CM | POA: Insufficient documentation

## 2023-08-08 DIAGNOSIS — I4819 Other persistent atrial fibrillation: Secondary | ICD-10-CM | POA: Diagnosis not present

## 2023-08-08 DIAGNOSIS — R4701 Aphasia: Secondary | ICD-10-CM

## 2023-08-08 DIAGNOSIS — R1310 Dysphagia, unspecified: Secondary | ICD-10-CM | POA: Diagnosis present

## 2023-08-08 DIAGNOSIS — R471 Dysarthria and anarthria: Secondary | ICD-10-CM

## 2023-08-08 NOTE — Progress Notes (Signed)
Modified Barium Swallow Study  Patient Details  Name: Todd Armstrong MRN: [id number] Date of Birth: [birth year]  Today's Date: 08/08/2023  Modified Barium Swallow completed.  Full report located under Chart Review in the Imaging Section.  History of Present Illness Pt is a 89 year old seen for outpatient accompanied by his daughter. MBS 6/24 revealed trace silent aspiration of  thin before the swallow managed with small/controlled sips, breath hold and regular, thin recommended. Pt and daughter state he continues to cough however daughter notes it is typically after the meal. Pt with complaints of coughing up mucous and globus pharyngeal sensation.   Clinical Impression Pt's swallow function has improved from prior MBS and he exhibits minimal oropharyngeal dysphagia and suspect a primary esophageal dysphagia. There was no laryngeal penetration or aspiration present. Orally he had mild lingual residue that he sensed and swallowed a second time to clear with honey and puree textures. There was mild vallecular and pyriform sinus residue with thin that was inconsistently sensed but cleared with cue to swallow. The barium pill administered with applesauce (uses applesauce at home) stopped mid esophagus and required additional sip thin barium to clear. SLP suspects he may have buildup of esophageal residue during meals leading to coughing after meals. Verbally instructed pt and daughter on strategies to faciliate esophageal involvement and daughter voiced understanding. Recommend continue regular texture, thin liquids, pils in applesauce, swallow twice and esophageal precautions. Factors that may increase risk of adverse event in presence of aspiration [employer] & Clearance Coots 2021):    Swallow Evaluation Recommendations Recommendations: PO diet PO Diet Recommendation: Regular;Thin liquids (Level 0) Liquid Administration via: Straw;Cup Medication Administration: Whole meds with puree Supervision: Patient  able to self-feed Swallowing strategies  : Slow rate;Small bites/sips;Multiple dry swallows after each bite/sip Postural changes: Stay upright 30-60 min after meals;Position pt fully upright for meals Oral care recommendations: Oral care BID (2x/day)      Royce Macadamia 08/08/2023,1:13 PM

## 2023-08-09 ENCOUNTER — Ambulatory Visit: Payer: Medicare Other | Admitting: Speech Pathology

## 2023-08-09 DIAGNOSIS — R1310 Dysphagia, unspecified: Secondary | ICD-10-CM

## 2023-08-09 DIAGNOSIS — R471 Dysarthria and anarthria: Secondary | ICD-10-CM

## 2023-08-09 DIAGNOSIS — R4701 Aphasia: Secondary | ICD-10-CM

## 2023-08-09 NOTE — Therapy (Signed)
OUTPATIENT SPEECH LANGUAGE PATHOLOGY TREATMENT (DISCHARGE)   Patient Name: Todd Armstrong MRN: [id number] DOB:[birth year], 89 y.o., male Today's Date: 08/09/2023  PCP: Emilio Aspen, MD REFERRING PROVIDER: Georgann Housekeeper, MD  END OF SESSION:  End of Session - 08/09/23 1019     Visit Number 17    Number of Visits 25    Date for SLP Re-Evaluation 08/11/23    SLP Start Time 1019    SLP Stop Time  1100    SLP Time Calculation (min) 41 min    Activity Tolerance Patient tolerated treatment well                Past Medical History:  Diagnosis Date   Acute myocardial infarction    Anemia    Aortic insufficiency    Carotid stenosis    Chronic combined systolic and diastolic CHF (congestive heart failure) (HCC)    Coronary artery disease    PTCA and stenting of his right coronary artery. 3.5 x 16-mm Liberte stent.               Diabetes mellitus without complication (HCC)    Dyslipidemia    Emphysema (subcutaneous) (surgical) resulting from a procedure    Hyperlipemia    Hypertension    Mitral regurgitation    Partial tear of rotator cuff    right shoulder   Peripheral vascular disease (HCC)    Persistent atrial fibrillation (HCC)    Pulmonary hypertension (HCC)    Stroke [facility])    Wears dentures    Past Surgical History:  Procedure Laterality Date   CARDIAC CATHETERIZATION  11/05/2008   CARDIAC CATHETERIZATION  10/22/1993   EF 60%   CARDIOVERSION N/A 06/05/2020   Procedure: CARDIOVERSION;  Surgeon: Vesta Mixer, MD;  Location: [facility] ENDOSCOPY;  Service: Cardiovascular;  Laterality: N/A;   CAROTID ENDARTERECTOMY Right 03/23/2018   CORONARY ANGIOPLASTY WITH STENT PLACEMENT     ENDARTERECTOMY Right 03/23/2018   Procedure: RIGHT CAROTID ARTERY ENDARTERECTOMY;  Surgeon: Maeola Harman, MD;  Location: [facility] OR;  Service: Vascular;  Laterality: Right;   FRACTURE SURGERY     righ fibula and tibia   HERNIA REPAIR  1984   KNEE ARTHROSCOPY Left     MULTIPLE TOOTH EXTRACTIONS     PATCH ANGIOPLASTY Right 03/23/2018   Procedure: WITH Livia Snellen 1CM X 6CM PATCH ANGIOPLASTY;  Surgeon: Maeola Harman, MD;  Location: [facility] OR;  Service: Vascular;  Laterality: Right;   SHOULDER ARTHROSCOPY Right 06/09/2017   Procedure: ARTHROSCOPY SHOULDER SUBACROMIAL DECOMPRESSION AND ACROMIOPLASTY;  Surgeon: Marcene Corning, MD;  Location: [facility] OR;  Service: Orthopedics;  Laterality: Right;  GENERAL ANESTHESIA WITH BLOCK   Patient Active Problem List   Diagnosis Date Noted   Atrial fibrillation, persistent (HCC) 02/17/2023   CKD (chronic kidney disease) stage 3, GFR 30-59 ml/min (HCC) 02/17/2023   CHF exacerbation (HCC) 09/27/2021   Emphysema/COPD (HCC) 09/27/2021   Type 2 diabetes mellitus with hyperlipidemia (HCC) 09/27/2021   Pulmonary HTN (HCC) 06/03/2021   Persistent atrial fibrillation (HCC)    Carotid stenosis, asymptomatic, right 03/23/2018   Bilateral carotid bruits 10/15/2016   Acute myocardial infarction    Dyslipidemia    Coronary artery disease    Hypertension    Hyperlipemia     ONSET DATE: 04/27/23   REFERRING DIAG:  R13.10 (ICD-10-CM) - Dysphagia, unspecified   THERAPY DIAG:  Dysphagia, unspecified type  Dysarthria and anarthria  Aphasia  Rationale for Evaluation and Treatment: Rehabilitation  SUBJECTIVE:   SUBJECTIVE STATEMENT:  Pt is pleased with results from MBSS.   PERTINENT HISTORY:   Patient complains of Dysphagia patient is awaiting the speech evaluation scheduled according to the daughter next week. He had had pneumonia probably aspiration pneumonia he is getting frail and weakness probably having swallowing muscle weakness.  Difficulty in coughing even with liquids and solid as per the daughter.          His other issue is constant nasal drip and drainage specially worse with eating more rhinitis that also is making things worse.   PAIN:  Are you having pain? No  FALLS: Has patient fallen in last 6 months?   Yes  LIVING ENVIRONMENT: Lives with: lives with their family and lives with their daughter Lives in: House/apartment  PLOF:  Level of assistance: Independent with ADLs, Independent with IADLs Employment: Retired  PATIENT GOALS: "get Engineer, manufacturing systems.   OBJECTIVE:   PATIENT REPORTED OUTCOME MEASURES (PROM): EAT-10: 36/40    TODAY'S TREATMENT:  08/09/23: Reviewed strategies and compensations to support communication with dysarthria. Pt able to demonstrate at sentence level in structured task with 90% accuracy. Reviewed results from MBSS and addressed esophageal motility strategies. Pt agreeable to ST d/c is pleased with progress made.  08/05/23: Targeted improved speech intelligibility with use of trained dysarthria compensations. At reading (sentence) level, pt with intelligible production on initial attempt 66% of trials. Sentence generation given word, pt with 60% accuracy of all words initial attempt. Pt with mod-I implementation of spelling and writing as support for communication breakdowns. Reviewed dysphagia strategies for solid PO.   08/02/23:  DYSPHAGIA TREATMENT:   Patient directly observed with POs: Yes: dysphagia 1 (puree) and thin liquids  Feeding: able to feed self Liquids provided by: cup Oral phase signs and symptoms:  none Pharyngeal phase signs and symptoms: delayed throat clear x2 (puree) and complaints of globus (x1 puree)  Therapeutic exercises: Effortful Swallow and EMST Types of cueing: verbal Amount of cueing: modified independent Treatment comments: ongoing challenges with correct execution of EMST, though evidencing release of pressure. Pt completed x20 repetitions with pressure set at 58 cmH2O. Completed effortful swallow with thin liquids (x20) and puree (x30)  MOTOR SPEECH TREATMENT: Target use of dysarthria strategies at generative sentence level in response to questions. Intelligibility on initial attempt about 60%. Improved accuracy on subsequent  attempt. SLP provided min-A to enhance employment of dysarthria strategies and compensations.    07/28/23:  Swallow tx: reviewed HEP and adjusted to maximize benefit. Pt requests f/u MBSS to assess if improvements have been made with daily adherence to prescribed HEP. Pt demonstrates effortful swallow x5 and EMST x10 with usual verbal cues to enhance quality of repetitions. Continues to evidence challenges with proper execution but is using forceful exhale and demonstrating ability to release pressure of device.   Dysarthria tx: addressed employment of dysarthria strategies in conversational speech. Pt demonstrating slow rate and syllabification with mod-I. Occasional benefit from written aid to enhance production. Practiced using short phrases and verification of understanding from communication partner to ensure intelligibility across expanded utterances.   PATIENT EDUCATION: Education details: See above Person educated: Patient and Child(ren) Education method: Explanation Education comprehension: verbalized understanding   ASSESSMENT:  CLINICAL IMPRESSION: Ongoing education and training of targeted swallow exercises and use of EMST to support dysphagia rehabilitation and dysarthria compensations at phrase level. Pts dysarthria improves when provided with guidance on syllabification and reminders to breathe and take time while speaking. Pt requires usual cues but is achieving intelligible productions more consistantly  during structured tasks. Pt continues to benefit from skilled ST.   OBJECTIVE IMPAIRMENTS: include voice disorder and dysphagia. These impairments are limiting patient from safety when swallowing. Factors affecting potential to achieve goals and functional outcome are previous level of function. Patient will benefit from skilled SLP services to address above impairments and improve overall function.  REHAB POTENTIAL: Good   GOALS: Goals reviewed with patient? Yes  SHORT  TERM GOALS: Target date: 06/16/2023  Pt will participate in MBSS Baseline: Goal status: MET  2.  Pt will teach back and demonstrate use of swallow strategies and targeted swallow exercises with 100% accuracy, with use of visual aid PRN Baseline:  Goal status: MET  3.  Pt will demonstrate completion of x4 potential aphasia HEP activities with mod-A from SLP  Baseline:  Goal status: MET  4.  Pt will report HEP for dysphagia/aphasia completion 5/7 days over 1 week period Baseline:  Goal status: NOT MET   LONG TERM GOALS: Target date: 08/11/2023  Pt will report successful completion of HEP 5/7 days over 2 week period with support from daughter Baseline:  Goal status: MET   2.  Pt will complete structured language tasks with 80% accuracy given occasional mod-A Baseline:  Goal status: MET  3.  Pt will report improvement via EAT-10 by dc Baseline:  Goal status: MET  4.  Pt will complete f/u MBSS, if indicated  Baseline:  Goal status: MET  PLAN:  SLP FREQUENCY: 2x/week  SLP DURATION: 12 weeks  PLANNED INTERVENTIONS: Aspiration precaution training, Pharyngeal strengthening exercises, Diet toleration management , Language facilitation, Trials of upgraded texture/liquids, Cueing hierachy, Functional tasks, Multimodal communication approach, SLP instruction and feedback, Compensatory strategies, Patient/family education, and Re-evaluation  SPEECH THERAPY DISCHARGE SUMMARY  Visits from Start of Care: 17  Current functional level related to goals / functional outcomes: Moderate improvement regarding dysarthria evidenced by increased intelligibility with employment of dysarthria compensations and communication strategies. Improvement in swallow function evidenced by MBSS with pt reducing aspiration occurences.    Remaining deficits: Dysarthria, dysphagia - physiologic impairments but overall function. Potentially esophageal dysphagia present. Recommend GI referral to further  assess.    Education / Equipment: Dysarthria strategies/compensations, dysphagia exercises, swallow strategies/compensations, HEP   Patient agrees to discharge. Patient goals were met. Patient is being discharged due to being pleased with the current functional level.   Maia Breslow, CCC-SLP 08/09/2023, 10:46 AM

## 2023-08-11 ENCOUNTER — Ambulatory Visit: Payer: Medicare Other | Admitting: Speech Pathology

## 2023-08-12 ENCOUNTER — Encounter: Payer: Medicare Other | Admitting: Speech Pathology

## 2023-10-13 ENCOUNTER — Telehealth: Payer: Self-pay | Admitting: Cardiovascular Disease

## 2023-10-13 NOTE — Telephone Encounter (Signed)
Calling to get copy of patient last echo. Fax #[phone]. Please advise

## 2023-10-14 DIAGNOSIS — I5042 Chronic combined systolic (congestive) and diastolic (congestive) heart failure: Secondary | ICD-10-CM | POA: Insufficient documentation

## 2023-11-25 ENCOUNTER — Other Ambulatory Visit: Payer: Self-pay | Admitting: Cardiovascular Disease

## 2023-12-22 DIAGNOSIS — J3489 Other specified disorders of nose and nasal sinuses: Secondary | ICD-10-CM | POA: Diagnosis not present

## 2023-12-22 DIAGNOSIS — Z0000 Encounter for general adult medical examination without abnormal findings: Secondary | ICD-10-CM | POA: Diagnosis not present

## 2023-12-22 DIAGNOSIS — M899 Disorder of bone, unspecified: Secondary | ICD-10-CM | POA: Diagnosis not present

## 2023-12-22 DIAGNOSIS — R1312 Dysphagia, oropharyngeal phase: Secondary | ICD-10-CM | POA: Diagnosis not present

## 2023-12-22 DIAGNOSIS — R4701 Aphasia: Secondary | ICD-10-CM | POA: Diagnosis not present

## 2024-01-10 DIAGNOSIS — N189 Chronic kidney disease, unspecified: Secondary | ICD-10-CM | POA: Diagnosis not present

## 2024-01-10 DIAGNOSIS — M1711 Unilateral primary osteoarthritis, right knee: Secondary | ICD-10-CM | POA: Diagnosis not present

## 2024-01-10 DIAGNOSIS — R600 Localized edema: Secondary | ICD-10-CM | POA: Diagnosis not present

## 2024-01-10 DIAGNOSIS — Z7983 Long term (current) use of bisphosphonates: Secondary | ICD-10-CM | POA: Diagnosis not present

## 2024-01-10 DIAGNOSIS — E785 Hyperlipidemia, unspecified: Secondary | ICD-10-CM | POA: Diagnosis not present

## 2024-01-10 DIAGNOSIS — I130 Hypertensive heart and chronic kidney disease with heart failure and stage 1 through stage 4 chronic kidney disease, or unspecified chronic kidney disease: Secondary | ICD-10-CM | POA: Diagnosis not present

## 2024-01-10 DIAGNOSIS — Z7951 Long term (current) use of inhaled steroids: Secondary | ICD-10-CM | POA: Diagnosis not present

## 2024-01-10 DIAGNOSIS — Z955 Presence of coronary angioplasty implant and graft: Secondary | ICD-10-CM | POA: Diagnosis not present

## 2024-01-10 DIAGNOSIS — I509 Heart failure, unspecified: Secondary | ICD-10-CM | POA: Diagnosis not present

## 2024-01-10 DIAGNOSIS — J449 Chronic obstructive pulmonary disease, unspecified: Secondary | ICD-10-CM | POA: Diagnosis not present

## 2024-01-10 DIAGNOSIS — E1122 Type 2 diabetes mellitus with diabetic chronic kidney disease: Secondary | ICD-10-CM | POA: Diagnosis not present

## 2024-01-10 DIAGNOSIS — Z7901 Long term (current) use of anticoagulants: Secondary | ICD-10-CM | POA: Diagnosis not present

## 2024-01-10 DIAGNOSIS — M79604 Pain in right leg: Secondary | ICD-10-CM | POA: Diagnosis not present

## 2024-01-10 DIAGNOSIS — M25551 Pain in right hip: Secondary | ICD-10-CM | POA: Diagnosis not present

## 2024-01-10 DIAGNOSIS — M7121 Synovial cyst of popliteal space [Baker], right knee: Secondary | ICD-10-CM | POA: Diagnosis not present

## 2024-01-10 DIAGNOSIS — Z7902 Long term (current) use of antithrombotics/antiplatelets: Secondary | ICD-10-CM | POA: Diagnosis not present

## 2024-01-10 DIAGNOSIS — I2510 Atherosclerotic heart disease of native coronary artery without angina pectoris: Secondary | ICD-10-CM | POA: Diagnosis not present

## 2024-01-13 DIAGNOSIS — M1711 Unilateral primary osteoarthritis, right knee: Secondary | ICD-10-CM | POA: Diagnosis not present

## 2024-01-17 DIAGNOSIS — R1310 Dysphagia, unspecified: Secondary | ICD-10-CM | POA: Diagnosis not present

## 2024-01-17 DIAGNOSIS — G5603 Carpal tunnel syndrome, bilateral upper limbs: Secondary | ICD-10-CM | POA: Diagnosis not present

## 2024-01-17 DIAGNOSIS — G5623 Lesion of ulnar nerve, bilateral upper limbs: Secondary | ICD-10-CM | POA: Diagnosis not present

## 2024-01-17 DIAGNOSIS — G629 Polyneuropathy, unspecified: Secondary | ICD-10-CM | POA: Diagnosis not present

## 2024-01-19 DIAGNOSIS — R4701 Aphasia: Secondary | ICD-10-CM | POA: Diagnosis not present

## 2024-01-26 DIAGNOSIS — H04123 Dry eye syndrome of bilateral lacrimal glands: Secondary | ICD-10-CM | POA: Diagnosis not present

## 2024-01-26 DIAGNOSIS — H0102A Squamous blepharitis right eye, upper and lower eyelids: Secondary | ICD-10-CM | POA: Diagnosis not present

## 2024-01-26 DIAGNOSIS — H2513 Age-related nuclear cataract, bilateral: Secondary | ICD-10-CM | POA: Diagnosis not present

## 2024-01-26 DIAGNOSIS — H0102B Squamous blepharitis left eye, upper and lower eyelids: Secondary | ICD-10-CM | POA: Diagnosis not present

## 2024-01-26 DIAGNOSIS — E119 Type 2 diabetes mellitus without complications: Secondary | ICD-10-CM | POA: Diagnosis not present

## 2024-01-31 DIAGNOSIS — M792 Neuralgia and neuritis, unspecified: Secondary | ICD-10-CM | POA: Diagnosis not present

## 2024-01-31 DIAGNOSIS — R29898 Other symptoms and signs involving the musculoskeletal system: Secondary | ICD-10-CM | POA: Diagnosis not present

## 2024-01-31 DIAGNOSIS — E1151 Type 2 diabetes mellitus with diabetic peripheral angiopathy without gangrene: Secondary | ICD-10-CM | POA: Diagnosis not present

## 2024-01-31 DIAGNOSIS — B351 Tinea unguium: Secondary | ICD-10-CM | POA: Diagnosis not present

## 2024-02-28 DIAGNOSIS — G5601 Carpal tunnel syndrome, right upper limb: Secondary | ICD-10-CM | POA: Diagnosis not present

## 2024-02-28 DIAGNOSIS — G629 Polyneuropathy, unspecified: Secondary | ICD-10-CM | POA: Diagnosis not present

## 2024-02-28 DIAGNOSIS — M5412 Radiculopathy, cervical region: Secondary | ICD-10-CM | POA: Diagnosis not present

## 2024-02-29 DIAGNOSIS — D649 Anemia, unspecified: Secondary | ICD-10-CM | POA: Diagnosis not present

## 2024-02-29 DIAGNOSIS — D539 Nutritional anemia, unspecified: Secondary | ICD-10-CM | POA: Diagnosis not present

## 2024-02-29 DIAGNOSIS — D801 Nonfamilial hypogammaglobulinemia: Secondary | ICD-10-CM | POA: Diagnosis not present

## 2024-02-29 DIAGNOSIS — M899 Disorder of bone, unspecified: Secondary | ICD-10-CM | POA: Diagnosis not present

## 2024-03-02 DIAGNOSIS — I429 Cardiomyopathy, unspecified: Secondary | ICD-10-CM | POA: Diagnosis not present

## 2024-03-02 DIAGNOSIS — I482 Chronic atrial fibrillation, unspecified: Secondary | ICD-10-CM | POA: Diagnosis not present

## 2024-03-02 DIAGNOSIS — I10 Essential (primary) hypertension: Secondary | ICD-10-CM | POA: Diagnosis not present

## 2024-03-02 DIAGNOSIS — I5020 Unspecified systolic (congestive) heart failure: Secondary | ICD-10-CM | POA: Diagnosis not present

## 2024-03-04 DIAGNOSIS — Z7951 Long term (current) use of inhaled steroids: Secondary | ICD-10-CM | POA: Diagnosis not present

## 2024-03-04 DIAGNOSIS — I110 Hypertensive heart disease with heart failure: Secondary | ICD-10-CM | POA: Diagnosis not present

## 2024-03-04 DIAGNOSIS — I2510 Atherosclerotic heart disease of native coronary artery without angina pectoris: Secondary | ICD-10-CM | POA: Diagnosis not present

## 2024-03-04 DIAGNOSIS — Z7902 Long term (current) use of antithrombotics/antiplatelets: Secondary | ICD-10-CM | POA: Diagnosis not present

## 2024-03-04 DIAGNOSIS — I10 Essential (primary) hypertension: Secondary | ICD-10-CM | POA: Diagnosis not present

## 2024-03-04 DIAGNOSIS — E785 Hyperlipidemia, unspecified: Secondary | ICD-10-CM | POA: Diagnosis not present

## 2024-03-04 DIAGNOSIS — I4891 Unspecified atrial fibrillation: Secondary | ICD-10-CM | POA: Diagnosis not present

## 2024-03-04 DIAGNOSIS — I509 Heart failure, unspecified: Secondary | ICD-10-CM | POA: Diagnosis not present

## 2024-03-04 DIAGNOSIS — Z87891 Personal history of nicotine dependence: Secondary | ICD-10-CM | POA: Diagnosis not present

## 2024-03-04 DIAGNOSIS — J449 Chronic obstructive pulmonary disease, unspecified: Secondary | ICD-10-CM | POA: Diagnosis not present

## 2024-03-04 DIAGNOSIS — Z8673 Personal history of transient ischemic attack (TIA), and cerebral infarction without residual deficits: Secondary | ICD-10-CM | POA: Diagnosis not present

## 2024-03-04 DIAGNOSIS — R059 Cough, unspecified: Secondary | ICD-10-CM | POA: Diagnosis not present

## 2024-03-04 DIAGNOSIS — Z7901 Long term (current) use of anticoagulants: Secondary | ICD-10-CM | POA: Diagnosis not present

## 2024-03-04 DIAGNOSIS — R1310 Dysphagia, unspecified: Secondary | ICD-10-CM | POA: Diagnosis not present

## 2024-03-06 DIAGNOSIS — I10 Essential (primary) hypertension: Secondary | ICD-10-CM | POA: Diagnosis not present

## 2024-03-06 DIAGNOSIS — I429 Cardiomyopathy, unspecified: Secondary | ICD-10-CM | POA: Diagnosis not present

## 2024-03-06 DIAGNOSIS — I482 Chronic atrial fibrillation, unspecified: Secondary | ICD-10-CM | POA: Diagnosis not present

## 2024-03-08 DIAGNOSIS — D649 Anemia, unspecified: Secondary | ICD-10-CM | POA: Diagnosis not present

## 2024-03-08 DIAGNOSIS — M1711 Unilateral primary osteoarthritis, right knee: Secondary | ICD-10-CM | POA: Diagnosis not present

## 2024-03-08 DIAGNOSIS — M899 Disorder of bone, unspecified: Secondary | ICD-10-CM | POA: Diagnosis not present

## 2024-03-13 DIAGNOSIS — D649 Anemia, unspecified: Secondary | ICD-10-CM | POA: Diagnosis not present

## 2024-03-13 DIAGNOSIS — N1831 Chronic kidney disease, stage 3a: Secondary | ICD-10-CM | POA: Diagnosis not present

## 2024-03-21 DIAGNOSIS — I5020 Unspecified systolic (congestive) heart failure: Secondary | ICD-10-CM | POA: Diagnosis not present

## 2024-03-21 DIAGNOSIS — R4701 Aphasia: Secondary | ICD-10-CM | POA: Diagnosis not present

## 2024-03-21 DIAGNOSIS — I4891 Unspecified atrial fibrillation: Secondary | ICD-10-CM | POA: Diagnosis not present

## 2024-03-21 DIAGNOSIS — J3489 Other specified disorders of nose and nasal sinuses: Secondary | ICD-10-CM | POA: Diagnosis not present

## 2024-03-21 DIAGNOSIS — N1832 Chronic kidney disease, stage 3b: Secondary | ICD-10-CM | POA: Diagnosis not present

## 2024-03-27 DIAGNOSIS — I2510 Atherosclerotic heart disease of native coronary artery without angina pectoris: Secondary | ICD-10-CM | POA: Diagnosis not present

## 2024-03-27 DIAGNOSIS — Z7951 Long term (current) use of inhaled steroids: Secondary | ICD-10-CM | POA: Diagnosis not present

## 2024-03-27 DIAGNOSIS — R1032 Left lower quadrant pain: Secondary | ICD-10-CM | POA: Diagnosis not present

## 2024-03-27 DIAGNOSIS — E1122 Type 2 diabetes mellitus with diabetic chronic kidney disease: Secondary | ICD-10-CM | POA: Diagnosis not present

## 2024-03-27 DIAGNOSIS — R300 Dysuria: Secondary | ICD-10-CM | POA: Diagnosis not present

## 2024-03-27 DIAGNOSIS — E785 Hyperlipidemia, unspecified: Secondary | ICD-10-CM | POA: Diagnosis not present

## 2024-03-27 DIAGNOSIS — N189 Chronic kidney disease, unspecified: Secondary | ICD-10-CM | POA: Diagnosis not present

## 2024-03-27 DIAGNOSIS — R079 Chest pain, unspecified: Secondary | ICD-10-CM | POA: Diagnosis not present

## 2024-03-27 DIAGNOSIS — I130 Hypertensive heart and chronic kidney disease with heart failure and stage 1 through stage 4 chronic kidney disease, or unspecified chronic kidney disease: Secondary | ICD-10-CM | POA: Diagnosis not present

## 2024-03-27 DIAGNOSIS — J449 Chronic obstructive pulmonary disease, unspecified: Secondary | ICD-10-CM | POA: Diagnosis not present

## 2024-03-27 DIAGNOSIS — I10 Essential (primary) hypertension: Secondary | ICD-10-CM | POA: Diagnosis not present

## 2024-03-27 DIAGNOSIS — I4891 Unspecified atrial fibrillation: Secondary | ICD-10-CM | POA: Diagnosis not present

## 2024-03-27 DIAGNOSIS — R109 Unspecified abdominal pain: Secondary | ICD-10-CM | POA: Diagnosis not present

## 2024-03-27 DIAGNOSIS — Z7901 Long term (current) use of anticoagulants: Secondary | ICD-10-CM | POA: Diagnosis not present

## 2024-03-27 DIAGNOSIS — K4090 Unilateral inguinal hernia, without obstruction or gangrene, not specified as recurrent: Secondary | ICD-10-CM | POA: Diagnosis not present

## 2024-03-27 DIAGNOSIS — R1031 Right lower quadrant pain: Secondary | ICD-10-CM | POA: Diagnosis not present

## 2024-03-27 DIAGNOSIS — Z7902 Long term (current) use of antithrombotics/antiplatelets: Secondary | ICD-10-CM | POA: Diagnosis not present

## 2024-03-27 DIAGNOSIS — I509 Heart failure, unspecified: Secondary | ICD-10-CM | POA: Diagnosis not present

## 2024-03-27 DIAGNOSIS — Z87891 Personal history of nicotine dependence: Secondary | ICD-10-CM | POA: Diagnosis not present

## 2024-03-27 DIAGNOSIS — E119 Type 2 diabetes mellitus without complications: Secondary | ICD-10-CM | POA: Diagnosis not present

## 2024-03-27 DIAGNOSIS — Z9861 Coronary angioplasty status: Secondary | ICD-10-CM | POA: Diagnosis not present

## 2024-04-02 DIAGNOSIS — I429 Cardiomyopathy, unspecified: Secondary | ICD-10-CM | POA: Diagnosis not present

## 2024-04-02 DIAGNOSIS — I2510 Atherosclerotic heart disease of native coronary artery without angina pectoris: Secondary | ICD-10-CM | POA: Diagnosis not present

## 2024-04-02 DIAGNOSIS — I10 Essential (primary) hypertension: Secondary | ICD-10-CM | POA: Diagnosis not present

## 2024-04-02 DIAGNOSIS — I482 Chronic atrial fibrillation, unspecified: Secondary | ICD-10-CM | POA: Diagnosis not present

## 2024-06-12 ENCOUNTER — Emergency Department
Admission: EM | Admit: 2024-06-12 | Discharge: 2024-06-12 | Disposition: A | Discharge status: Home / Self Care | Attending: Emergency Medicine | Admitting: Emergency Medicine

## 2024-06-12 ENCOUNTER — Other Ambulatory Visit: Payer: Self-pay

## 2024-06-12 DIAGNOSIS — I2510 Atherosclerotic heart disease of native coronary artery without angina pectoris: Secondary | ICD-10-CM | POA: Insufficient documentation

## 2024-06-12 DIAGNOSIS — R102 Pelvic and perineal pain: Secondary | ICD-10-CM | POA: Diagnosis present

## 2024-06-12 DIAGNOSIS — I10 Essential (primary) hypertension: Secondary | ICD-10-CM | POA: Insufficient documentation

## 2024-06-12 DIAGNOSIS — K5900 Constipation, unspecified: Secondary | ICD-10-CM | POA: Insufficient documentation

## 2024-06-12 LAB — COMPREHENSIVE METABOLIC PANEL WITH GFR
ALT: 23 U/L (ref 0–44)
AST: 27 U/L (ref 15–41)
Albumin: 4 g/dL (ref 3.5–5.0)
Alkaline Phosphatase: 125 U/L (ref 38–126)
Anion gap: 15 (ref 5–15)
BUN: 35 mg/dL — ABNORMAL HIGH (ref 8–23)
CO2: 30 mmol/L (ref 22–32)
Calcium: 9.4 mg/dL (ref 8.9–10.3)
Chloride: 96 mmol/L — ABNORMAL LOW (ref 98–111)
Creatinine, Ser: 1.35 mg/dL — ABNORMAL HIGH (ref 0.61–1.24)
GFR, Estimated: 50 mL/min — ABNORMAL LOW (ref >60)
Glucose, Bld: 199 mg/dL — ABNORMAL HIGH (ref 70–99)
Potassium: 4 mmol/L (ref 3.5–5.1)
Sodium: 141 mmol/L (ref 135–145)
Total Bilirubin: 1.4 mg/dL — ABNORMAL HIGH (ref 0.0–1.2)
Total Protein: 6.8 g/dL (ref 6.5–8.1)

## 2024-06-12 LAB — CBC WITH DIFFERENTIAL/PLATELET
Abs Immature Granulocytes: 0.05 K/uL (ref 0.00–0.07)
Basophils Absolute: 0.1 K/uL (ref 0.0–0.1)
Basophils Relative: 1 %
Eosinophils Absolute: 0.1 K/uL (ref 0.0–0.5)
Eosinophils Relative: 1 %
HCT: 34.5 % — ABNORMAL LOW (ref 39.0–52.0)
Hemoglobin: 11.5 g/dL — ABNORMAL LOW (ref 13.0–17.0)
Immature Granulocytes: 1 %
Lymphocytes Relative: 12 %
Lymphs Abs: 1.2 K/uL (ref 0.7–4.0)
MCH: 33.1 pg (ref 26.0–34.0)
MCHC: 33.3 g/dL (ref 30.0–36.0)
MCV: 99.4 fL (ref 80.0–100.0)
Monocytes Absolute: 0.7 K/uL (ref 0.1–1.0)
Monocytes Relative: 7 %
Neutro Abs: 8.1 K/uL — ABNORMAL HIGH (ref 1.7–7.7)
Neutrophils Relative %: 78 %
Platelets: 269 K/uL (ref 150–400)
RBC: 3.47 MIL/uL — ABNORMAL LOW (ref 4.22–5.81)
RDW: 15.8 % — ABNORMAL HIGH (ref 11.5–15.5)
WBC: 10.2 K/uL (ref 4.0–10.5)
nRBC: 0 % (ref 0.0–0.2)

## 2024-06-12 LAB — PROTIME-INR
INR: 1 (ref 0.8–1.2)
Prothrombin Time: 14.3 s (ref 11.4–15.2)

## 2024-06-12 NOTE — ED Triage Notes (Signed)
 Pt comes in via pov with complaints of not having a bowel movement the past few days. Family gave the patient an enema about an hour ago with no relief. Pt complains of rectal pain 10/10 at this time. Pt is not verbal, pt son in-law is with him and helps to communicate

## 2024-06-12 NOTE — ED Provider Notes (Signed)
 [facility] Provider Note    Event Date/Time   First MD Initiated Contact with Patient 06/12/24 1805     (approximate)  History   Chief Complaint: Constipation  HPI  Todd Armstrong is a 89 y.o. male with a past medical history of CAD, hypertension, hyperlipidemia, prior CVA, presents to the emergency department for constipation and rectal pain.  According to the son patient has not had a bowel movement approximately 3 to 4 days.  Has been complaining of some pain around the rectum as well.  They attempted to do an enema at home without relief so they brought the patient to the emergency department.  Patient denies any abdominal pain.  Reassuring vital signs.  Physical Exam   Triage Vital Signs: ED Triage Vitals  Encounter Vitals Group     BP 06/12/24 1550 (!) 106/55     Girls Systolic BP Percentile --      Girls Diastolic BP Percentile --      Boys Systolic BP Percentile --      Boys Diastolic BP Percentile --      Pulse Rate 06/12/24 1550 83     Resp 06/12/24 1550 19     Temp 06/12/24 1550 98 F (36.7 C)     Temp Source 06/12/24 1550 Oral     SpO2 06/12/24 1550 98 %     Weight 06/12/24 1551 134 lb 0.6 oz (60.8 kg)     Height 06/12/24 1551 5' 9 (1.753 m)     Head Circumference --      Peak Flow --      Pain Score 06/12/24 1550 10     Pain Loc --      Pain Education --      Exclude from Growth Chart --     Most recent vital signs: Vitals:   06/12/24 1550  BP: (!) 106/55  Pulse: 83  Resp: 19  Temp: 98 F (36.7 C)  SpO2: 98%    General: Awake, no distress.  Thin. CV:  Good peripheral perfusion.  Regular rate and rhythm  Resp:  Normal effort.  Equal breath sounds bilaterally.  Abd:  No distention.  Soft, nontender.  No rebound or guarding.   ED Results / Procedures / Treatments   MEDICATIONS ORDERED IN ED: Medications - No data to display   IMPRESSION / MDM / ASSESSMENT AND PLAN / ED COURSE  I reviewed the triage vital signs  and the nursing notes.  Patient's presentation is most consistent with acute presentation with potential threat to life or bodily function.  Patient presents emergency department for constipation x 3 to 4 days with some rectal pain when he attempts to have a bowel movement.  Symptoms sound suggestive of possible fecal impaction.  Attempted to do an enema at home without relief so they came to the emergency department.  Patient denies any abdominal pain.  Nontender abdomen.  CBC is reassuring.  Chemistry shows no significant finding.  Possible mild dehydration.  We will perform a rectal examination to evaluate for possible fecal impaction.  If there is a fecal impaction we will attempt disimpaction, if no impaction patient may require further abdominal imaging to evaluate.  Patient and son agreeable to plan.  Patient appears well, no distress.  Benign abdomen.  Rectal examination showed a large amount of hard stool in the rectum.  I was able to disimpact a very large amount of stool.  Patient only has soft stool remaining.  Discussed with son to increase oral hydration.  Given the patient's otherwise reassuring workup I believe patient is safe for discharge home with outpatient follow-up.  FINAL CLINICAL IMPRESSION(S) / ED DIAGNOSES   Constipation  Note:  This document was prepared using Dragon voice recognition software and may include unintentional dictation errors.   Dorothyann Drivers, MD 06/12/24 [phone]

## 2024-06-12 NOTE — Discharge Instructions (Addendum)
 As we discussed please increase fluids at home.  Return to the emergency department for any abdominal pain or any other symptom concerning to your sounds.

## 2024-06-12 NOTE — ED Notes (Signed)
Assisted MD with rectal exam.   Pt tolerated well.

## 2024-06-27 ENCOUNTER — Telehealth (HOSPITAL_COMMUNITY): Payer: Self-pay

## 2024-06-27 NOTE — Telephone Encounter (Signed)
 Outside/paper referral received by Dr. Clemens from [place].   Called patient regarding referral, patient's daughter Inocente stated they would like the referral sent to [facility]. Faxing referral to [place].

## 2024-07-11 ENCOUNTER — Encounter: Admitting: *Deleted

## 2024-07-11 DIAGNOSIS — R4701 Aphasia: Secondary | ICD-10-CM | POA: Insufficient documentation

## 2024-07-11 DIAGNOSIS — Z9861 Coronary angioplasty status: Secondary | ICD-10-CM

## 2024-07-11 DIAGNOSIS — E1136 Type 2 diabetes mellitus with diabetic cataract: Secondary | ICD-10-CM | POA: Insufficient documentation

## 2024-07-11 DIAGNOSIS — Z8673 Personal history of transient ischemic attack (TIA), and cerebral infarction without residual deficits: Secondary | ICD-10-CM | POA: Insufficient documentation

## 2024-07-11 DIAGNOSIS — E7800 Pure hypercholesterolemia, unspecified: Secondary | ICD-10-CM | POA: Insufficient documentation

## 2024-07-11 NOTE — Progress Notes (Signed)
 Initial phone call completed. Diagnosis can be found in [facility]. EP Orientation scheduled for Wednesday 8/6 at 10am.

## 2024-07-18 ENCOUNTER — Encounter: Admitting: *Deleted

## 2024-07-18 VITALS — Ht 65.5 in | Wt 115.5 lb

## 2024-07-18 DIAGNOSIS — I2510 Atherosclerotic heart disease of native coronary artery without angina pectoris: Secondary | ICD-10-CM | POA: Diagnosis not present

## 2024-07-18 DIAGNOSIS — Z9861 Coronary angioplasty status: Secondary | ICD-10-CM | POA: Insufficient documentation

## 2024-07-18 DIAGNOSIS — Z48812 Encounter for surgical aftercare following surgery on the circulatory system: Secondary | ICD-10-CM | POA: Diagnosis not present

## 2024-07-18 NOTE — Progress Notes (Signed)
 Assessment start time: 10:46 AM  Education r/t nutrition plan Patient presents today with daughter. They report he is on a dysphagia pureed thin liquids diet. He has some supplemental drinks like ensure max protein. Spoke with them about different ensure products and how best to use them as protein drinks or meal replacements. Reviewed a dysphagia pureed diet handout with sample menu and ideas to try. Encouraged them to prioritize protein and calorie rich foods like healthy fats. Did not complete PYP questionnaire due to restricted diet.      Goal 1: Eat 15-30gProtein and 30-60gCarbs at each meal. Goal 2: Try different pureed meals  End time 11:21 AM

## 2024-07-18 NOTE — Patient Instructions (Signed)
 Patient Instructions  Patient Details  Name: Todd Armstrong MRN: [id number] Date of Birth: 1931-05-10 Referring Provider:  Charlott Dorn LABOR, *  Below are your personal goals for exercise, nutrition, and risk factors. Our goal is to help you stay on track towards obtaining and maintaining these goals. We will be discussing your progress on these goals with you throughout the program.  Initial Exercise Prescription:  Initial Exercise Prescription - 07/18/24 1100       Date of Initial Exercise RX and Referring Provider   Date 07/18/24    Referring Provider Dr. Shanna Sauce      Oxygen   Maintain Oxygen Saturation 88% or higher      Recumbant Bike   Level 1    RPM 50    Watts 15    Minutes 15    METs 1.35      NuStep   Level 1    SPM 80    Minutes 15    METs 1.35      Biostep-RELP   Level 1    SPM 50    Minutes 15    METs 1.35      Track   Laps 10    Minutes 15    METs 1.54      Prescription Details   Frequency (times per week) 3    Duration Progress to 30 minutes of continuous aerobic without signs/symptoms of physical distress      Intensity   THRR 40-80% of Max Heartrate 99-119    Ratings of Perceived Exertion 11-13    Perceived Dyspnea 0-4      Progression   Progression Continue to progress workloads to maintain intensity without signs/symptoms of physical distress.      Resistance Training   Training Prescription Yes    Weight 2    Reps 10-15          Exercise Goals: Frequency: Be able to perform aerobic exercise two to three times per week in program working toward 2-5 days per week of home exercise.  Intensity: Work with a perceived exertion of 11 (fairly light) - 15 (hard) while following your exercise prescription.  We will make changes to your prescription with you as you progress through the program.   Duration: Be able to do 30 to 45 minutes of continuous aerobic exercise in addition to a 5 minute warm-up and a 5 minute cool-down  routine.   Nutrition Goals: Your personal nutrition goals will be established when you do your nutrition analysis with the dietician.  The following are general nutrition guidelines to follow: Cholesterol < 200mg /day Sodium < 1500mg /day Fiber: Men over 50 yrs - 30 grams per day  Personal Goals:  Personal Goals and Risk Factors at Admission - 07/18/24 1156       Core Components/Risk Factors/Patient Goals on Admission    Weight Management Yes;Weight Gain    Intervention Weight Management: Develop a combined nutrition and exercise program designed to reach desired caloric intake, while maintaining appropriate intake of nutrient and fiber, sodium and fats, and appropriate energy expenditure required for the weight goal.;Weight Management: Provide education and appropriate resources to help participant work on and attain dietary goals.;Weight Management/Obesity: Establish reasonable short term and long term weight goals.    Admit Weight 115 lb 8 oz (52.4 kg)    Goal Weight: Short Term 120 lb (54.4 kg)    Goal Weight: Long Term 130 lb (59 kg)    Expected Outcomes Short Term: Continue to  assess and modify interventions until short term weight is achieved;Long Term: Adherence to nutrition and physical activity/exercise program aimed toward attainment of established weight goal;Understanding recommendations for meals to include 15-35% energy as protein, 25-35% energy from fat, 35-60% energy from carbohydrates, less than 200mg  of dietary cholesterol, 20-35 gm of total fiber daily;Weight Gain: Understanding of general recommendations for a high calorie, high protein meal plan that promotes weight gain by distributing calorie intake throughout the day with the consumption for 4-5 meals, snacks, and/or supplements;Understanding of distribution of calorie intake throughout the day with the consumption of 4-5 meals/snacks    Diabetes Yes    Intervention Provide education about signs/symptoms and action to  take for hypo/hyperglycemia.;Provide education about proper nutrition, including hydration, and aerobic/resistive exercise prescription along with prescribed medications to achieve blood glucose in normal ranges: Fasting glucose 65-99 mg/dL    Expected Outcomes Short Term: Participant verbalizes understanding of the signs/symptoms and immediate care of hyper/hypoglycemia, proper foot care and importance of medication, aerobic/resistive exercise and nutrition plan for blood glucose control.;Long Term: Attainment of HbA1C < 7%.    Heart Failure Yes    Intervention Provide a combined exercise and nutrition program that is supplemented with education, support and counseling about heart failure. Directed toward relieving symptoms such as shortness of breath, decreased exercise tolerance, and extremity edema.    Expected Outcomes Improve functional capacity of life;Short term: Attendance in program 2-3 days a week with increased exercise capacity. Reported lower sodium intake. Reported increased fruit and vegetable intake. Reports medication compliance.;Short term: Daily weights obtained and reported for increase. Utilizing diuretic protocols set by physician.;Long term: Adoption of self-care skills and reduction of barriers for early signs and symptoms recognition and intervention leading to self-care maintenance.    Hypertension Yes    Intervention Provide education on lifestyle modifcations including regular physical activity/exercise, weight management, moderate sodium restriction and increased consumption of fresh fruit, vegetables, and low fat dairy, alcohol moderation, and smoking cessation.;Monitor prescription use compliance.    Expected Outcomes Short Term: Continued assessment and intervention until BP is < 140/32mm HG in hypertensive participants. < 130/65mm HG in hypertensive participants with diabetes, heart failure or chronic kidney disease.;Long Term: Maintenance of blood pressure at goal levels.     Lipids Yes    Intervention Provide education and support for participant on nutrition & aerobic/resistive exercise along with prescribed medications to achieve LDL 70mg , HDL >40mg .    Expected Outcomes Short Term: Participant states understanding of desired cholesterol values and is compliant with medications prescribed. Participant is following exercise prescription and nutrition guidelines.;Long Term: Cholesterol controlled with medications as prescribed, with individualized exercise RX and with personalized nutrition plan. Value goals: LDL < 70mg , HDL > 40 mg.          Tobacco Use Initial Evaluation: Social History   Tobacco Use  Smoking Status Former   Current packs/day: 0.00   Average packs/day: 1 pack/day for 8.0 years (8.0 ttl pk-yrs)   Types: Cigarettes   Start date: 09/12/1960   Quit date: 09/12/1968   Years since quitting: 55.8  Smokeless Tobacco Never  Tobacco Comments   quit smoking cigarettes > 40 years ago    Exercise Goals and Review:  Exercise Goals     Row Name 07/18/24 1155             Exercise Goals   Increase Physical Activity Yes       Intervention Provide advice, education, support and counseling about physical activity/exercise needs.;Develop an individualized  exercise prescription for aerobic and resistive training based on initial evaluation findings, risk stratification, comorbidities and participant's personal goals.       Expected Outcomes Short Term: Attend rehab on a regular basis to increase amount of physical activity.;Long Term: Add in home exercise to make exercise part of routine and to increase amount of physical activity.;Long Term: Exercising regularly at least 3-5 days a week.       Increase Strength and Stamina Yes       Intervention Provide advice, education, support and counseling about physical activity/exercise needs.;Develop an individualized exercise prescription for aerobic and resistive training based on initial evaluation  findings, risk stratification, comorbidities and participant's personal goals.       Expected Outcomes Short Term: Increase workloads from initial exercise prescription for resistance, speed, and METs.;Short Term: Perform resistance training exercises routinely during rehab and add in resistance training at home;Long Term: Improve cardiorespiratory fitness, muscular endurance and strength as measured by increased METs and functional capacity ( )       Able to understand and use rate of perceived exertion (RPE) scale Yes       Intervention Provide education and explanation on how to use RPE scale       Expected Outcomes Short Term: Able to use RPE daily in rehab to express subjective intensity level;Long Term:  Able to use RPE to guide intensity level when exercising independently       Able to understand and use Dyspnea scale Yes       Intervention Provide education and explanation on how to use Dyspnea scale       Expected Outcomes Short Term: Able to use Dyspnea scale daily in rehab to express subjective sense of shortness of breath during exertion;Long Term: Able to use Dyspnea scale to guide intensity level when exercising independently       Knowledge and understanding of Target Heart Rate Range (THRR) Yes       Intervention Provide education and explanation of THRR including how the numbers were predicted and where they are located for reference       Expected Outcomes Short Term: Able to state/look up THRR;Long Term: Able to use THRR to govern intensity when exercising independently;Short Term: Able to use daily as guideline for intensity in rehab       Able to check pulse independently Yes       Intervention Provide education and demonstration on how to check pulse in carotid and radial arteries.;Review the importance of being able to check your own pulse for safety during independent exercise       Expected Outcomes Short Term: Able to explain why pulse checking is important during  independent exercise;Long Term: Able to check pulse independently and accurately       Understanding of Exercise Prescription Yes       Intervention Provide education, explanation, and written materials on patient's individual exercise prescription       Expected Outcomes Short Term: Able to explain program exercise prescription;Long Term: Able to explain home exercise prescription to exercise independently          Copy of goals given to participant.

## 2024-07-18 NOTE — Progress Notes (Signed)
 Cardiac Individual Treatment Plan  Patient Details  Name: Todd Armstrong MRN: [id number] Date of Birth: April 09, 1931 Referring Provider:   Flowsheet Row Cardiac Rehab from 07/18/2024 in [facility] Cardiac and Pulmonary Rehab  Referring Provider Dr. Shanna Sauce    Initial Encounter Date:  Flowsheet Row Cardiac Rehab from 07/18/2024 in [facility] Cardiac and Pulmonary Rehab  Date 07/18/24    Visit Diagnosis: History of percutaneous coronary intervention  Patient's Home Medications on Admission:  Current Outpatient Medications:    Accu-Chek FastClix Lancets MISC, For use when checking blood sugars, Disp: , Rfl:    albuterol  (VENTOLIN  HFA) 108 (90 Base) MCG/ACT inhaler, Inhale 2 puffs into the lungs every 6 (six) hours as needed for wheezing or shortness of breath., Disp: 8 g, Rfl: 6   apixaban  (ELIQUIS ) 2.5 MG TABS tablet, Take 2.5 mg by mouth 2 (two) times daily. Per patient's daughter Dr. Charlott switched Apixaban  (Eliquis ) from 5 mg twice a day. Patient is now taking Apixaban  (Eliquis )  2.5 mg twice a day., Disp: , Rfl:    clopidogrel  (PLAVIX ) 75 MG tablet, TAKE 1 TABLET BY MOUTH DAILY, Disp: 100 tablet, Rfl: 3   Dextromethorphan  HBr 15 MG/5ML LIQD, Take 5 mLs (15 mg total) by mouth 3 (three) times daily as needed., Disp: 118 mL, Rfl: 0   metFORMIN  (GLUCOPHAGE ) 1000 MG tablet, Take 1,000 mg by mouth 2 (two) times daily with a meal. (Patient not taking: Reported on 07/11/2024), Disp: , Rfl:    metoprolol  succinate (TOPROL -XL) 25 MG 24 hr tablet, Take 25 mg by mouth daily., Disp: , Rfl:    metoprolol  tartrate (LOPRESSOR ) 25 MG tablet, Take 0.5 tablets (12.5 mg total) by mouth 2 (two) times daily. (Patient not taking: Reported on 07/11/2024), Disp: 30 tablet, Rfl: 0   nitroGLYCERIN  (NITROSTAT ) 0.4 MG SL tablet, Place 1 tablet (0.4 mg total) under the tongue every 5 (five) minutes as needed for chest pain., Disp: 25 tablet, Rfl: 6   PARoxetine (PAXIL) 20 MG tablet, Take by mouth., Disp: , Rfl:     potassium chloride  (KLOR-CON ) 10 MEQ tablet, Take 1 tablet (10 mEq total) by mouth 2 (two) times daily., Disp: 60 tablet, Rfl: 0   rosuvastatin  (CRESTOR ) 10 MG tablet, TAKE 1 TABLET BY MOUTH DAILY, Disp: 100 tablet, Rfl: 1   tamsulosin  (FLOMAX ) 0.4 MG CAPS capsule, Take 0.4 mg by mouth at bedtime. , Disp: , Rfl:    torsemide  (DEMADEX ) 20 MG tablet, Take 1/2 tablet by mouth only on Mondays, Wednesdays, and Fridays, Disp: 12 tablet, Rfl: 6   triamcinolone cream (KENALOG) 0.1 %, Apply 1 application topically as needed (for itching)., Disp: , Rfl:   Past Medical History: Past Medical History:  Diagnosis Date   Acute myocardial infarction    Anemia    Aortic insufficiency    Carotid stenosis    Chronic combined systolic and diastolic CHF (congestive heart failure) (HCC)    Coronary artery disease    PTCA and stenting of his right coronary artery. 3.5 x 16-mm Liberte stent.               Diabetes mellitus without complication (HCC)    Dyslipidemia    Emphysema (subcutaneous) (surgical) resulting from a procedure    Hyperlipemia    Hypertension    Mitral regurgitation    Partial tear of rotator cuff    right shoulder   Peripheral vascular disease (HCC)    Persistent atrial fibrillation (HCC)    Pulmonary hypertension (HCC)    Stroke (  HCC)    Wears dentures     Tobacco Use: Social History   Tobacco Use  Smoking Status Former   Current packs/day: 0.00   Average packs/day: 1 pack/day for 8.0 years (8.0 ttl pk-yrs)   Types: Cigarettes   Start date: 09/12/1960   Quit date: 09/12/1968   Years since quitting: 55.8  Smokeless Tobacco Never  Tobacco Comments   quit smoking cigarettes > 40 years ago    Labs: Review Flowsheet  More data exists      Latest Ref Rng & Units 02/19/2019 04/22/2020 06/03/2021 09/28/2021 09/09/2022  Labs for ITP Cardiac and Pulmonary Rehab  Cholestrol 100 - 199 mg/dL 867  851  868  - 862   LDL (calc) 0 - 99 mg/dL 37  52  38  - 33   HDL-C >39 mg/dL 76  75  79   - 88   Trlycerides 0 - 149 mg/dL 96  880  72  - 83   Hemoglobin A1c 4.8 - 5.6 % - - - 6.3  -     Exercise Target Goals: Exercise Program Goal: Individual exercise prescription set using results from initial 6 min walk test and THRR while considering  patient's activity barriers and safety.   Exercise Prescription Goal: Initial exercise prescription builds to 30-45 minutes a day of aerobic activity, 2-3 days per week.  Home exercise guidelines will be given to patient during program as part of exercise prescription that the participant will acknowledge.   Education: Aerobic Exercise: - Group verbal and visual presentation on the components of exercise prescription. Introduces F.I.T.T principle from ACSM for exercise prescriptions.  Reviews F.I.T.T. principles of aerobic exercise including progression. Written material given at graduation. Flowsheet Row Cardiac Rehab from 07/18/2024 in [facility] Cardiac and Pulmonary Rehab  Education need identified 07/18/24    Education: Resistance Exercise: - Group verbal and visual presentation on the components of exercise prescription. Introduces F.I.T.T principle from ACSM for exercise prescriptions  Reviews F.I.T.T. principles of resistance exercise including progression. Written material given at graduation.    Education: Exercise & Equipment Safety: - Individual verbal instruction and demonstration of equipment use and safety with use of the equipment. Flowsheet Row Cardiac Rehab from 07/18/2024 in [facility] Cardiac and Pulmonary Rehab  Date 07/18/24  Educator [facility]  Instruction Review Code 1- Verbalizes Understanding    Education: Exercise Physiology & General Exercise Guidelines: - Group verbal and written instruction with models to review the exercise physiology of the cardiovascular system and associated critical values. Provides general exercise guidelines with specific guidelines to those with heart or lung disease.  Flowsheet Row Cardiac Rehab from  07/18/2024 in [facility] Cardiac and Pulmonary Rehab  Education need identified 07/18/24    Education: Flexibility, Balance, Mind/Body Relaxation: - Group verbal and visual presentation with interactive activity on the components of exercise prescription. Introduces F.I.T.T principle from ACSM for exercise prescriptions. Reviews F.I.T.T. principles of flexibility and balance exercise training including progression. Also discusses the mind body connection.  Reviews various relaxation techniques to help reduce and manage stress (i.e. Deep breathing, progressive muscle relaxation, and visualization). Balance handout provided to take home. Written material given at graduation. Flowsheet Row Pulmonary Rehab from 01/21/2022 in [facility] Cardiac and Pulmonary Rehab  Date 12/23/21  Educator AS  Instruction Review Code 1- Verbalizes Understanding    Activity Barriers & Risk Stratification:  Activity Barriers & Cardiac Risk Stratification - 07/18/24 1145       Activity Barriers & Cardiac Risk Stratification   Activity  Barriers Back Problems;Neck/Spine Problems;Balance Concerns;History of Falls    Cardiac Risk Stratification High          6 Minute Walk:  6 Minute Walk     Row Name 07/18/24 1143         6 Minute Walk   Phase Initial     Distance 700 feet     Walk Time 6 minutes     # of Rest Breaks 0     MPH 1.33     METS 1.35     RPE 11     Perceived Dyspnea  0     VO2 Peak 4.73     Symptoms No     Resting HR 79 bpm     Resting BP 124/80     Resting Oxygen Saturation  98 %     Exercise Oxygen Saturation  during 6 min walk 92 %     Max Ex. HR 105 bpm     Max Ex. BP 150/80     2 Minute Post BP 124/82        Oxygen Initial Assessment:   Oxygen Re-Evaluation:   Oxygen Discharge (Final Oxygen Re-Evaluation):   Initial Exercise Prescription:  Initial Exercise Prescription - 07/18/24 1100       Date of Initial Exercise RX and Referring Provider   Date 07/18/24    Referring Provider  Dr. Shanna Sauce      Oxygen   Maintain Oxygen Saturation 88% or higher      Recumbant Bike   Level 1    RPM 50    Watts 15    Minutes 15    METs 1.35      NuStep   Level 1    SPM 80    Minutes 15    METs 1.35      Biostep-RELP   Level 1    SPM 50    Minutes 15    METs 1.35      Track   Laps 10    Minutes 15    METs 1.54      Prescription Details   Frequency (times per week) 3    Duration Progress to 30 minutes of continuous aerobic without signs/symptoms of physical distress      Intensity   THRR 40-80% of Max Heartrate 99-119    Ratings of Perceived Exertion 11-13    Perceived Dyspnea 0-4      Progression   Progression Continue to progress workloads to maintain intensity without signs/symptoms of physical distress.      Resistance Training   Training Prescription Yes    Weight 2    Reps 10-15          Perform Capillary Blood Glucose checks as needed.  Exercise Prescription Changes:   Exercise Prescription Changes     Row Name 07/18/24 1100             Response to Exercise   Blood Pressure (Admit) 124/80       Blood Pressure (Exercise) 150/80       Blood Pressure (Exit) 124/82       Heart Rate (Admit) 79 bpm       Heart Rate (Exercise) 105 bpm       Heart Rate (Exit) 88 bpm       Oxygen Saturation (Admit) 98 %       Oxygen Saturation (Exercise) 92 %       Oxygen Saturation (Exit) 100 %  Rating of Perceived Exertion (Exercise) 11       Perceived Dyspnea (Exercise) 0       Symptoms none       Comments 6 MWT          Exercise Comments:   Exercise Goals and Review:   Exercise Goals     Row Name 07/18/24 1155             Exercise Goals   Increase Physical Activity Yes       Intervention Provide advice, education, support and counseling about physical activity/exercise needs.;Develop an individualized exercise prescription for aerobic and resistive training based on initial evaluation findings, risk stratification,  comorbidities and participant's personal goals.       Expected Outcomes Short Term: Attend rehab on a regular basis to increase amount of physical activity.;Long Term: Add in home exercise to make exercise part of routine and to increase amount of physical activity.;Long Term: Exercising regularly at least 3-5 days a week.       Increase Strength and Stamina Yes       Intervention Provide advice, education, support and counseling about physical activity/exercise needs.;Develop an individualized exercise prescription for aerobic and resistive training based on initial evaluation findings, risk stratification, comorbidities and participant's personal goals.       Expected Outcomes Short Term: Increase workloads from initial exercise prescription for resistance, speed, and METs.;Short Term: Perform resistance training exercises routinely during rehab and add in resistance training at home;Long Term: Improve cardiorespiratory fitness, muscular endurance and strength as measured by increased METs and functional capacity ( )       Able to understand and use rate of perceived exertion (RPE) scale Yes       Intervention Provide education and explanation on how to use RPE scale       Expected Outcomes Short Term: Able to use RPE daily in rehab to express subjective intensity level;Long Term:  Able to use RPE to guide intensity level when exercising independently       Able to understand and use Dyspnea scale Yes       Intervention Provide education and explanation on how to use Dyspnea scale       Expected Outcomes Short Term: Able to use Dyspnea scale daily in rehab to express subjective sense of shortness of breath during exertion;Long Term: Able to use Dyspnea scale to guide intensity level when exercising independently       Knowledge and understanding of Target Heart Rate Range (THRR) Yes       Intervention Provide education and explanation of THRR including how the numbers were predicted and where they  are located for reference       Expected Outcomes Short Term: Able to state/look up THRR;Long Term: Able to use THRR to govern intensity when exercising independently;Short Term: Able to use daily as guideline for intensity in rehab       Able to check pulse independently Yes       Intervention Provide education and demonstration on how to check pulse in carotid and radial arteries.;Review the importance of being able to check your own pulse for safety during independent exercise       Expected Outcomes Short Term: Able to explain why pulse checking is important during independent exercise;Long Term: Able to check pulse independently and accurately       Understanding of Exercise Prescription Yes       Intervention Provide education, explanation, and written materials on patient's individual exercise prescription  Expected Outcomes Short Term: Able to explain program exercise prescription;Long Term: Able to explain home exercise prescription to exercise independently          Exercise Goals Re-Evaluation :   Discharge Exercise Prescription (Final Exercise Prescription Changes):  Exercise Prescription Changes - 07/18/24 1100       Response to Exercise   Blood Pressure (Admit) 124/80    Blood Pressure (Exercise) 150/80    Blood Pressure (Exit) 124/82    Heart Rate (Admit) 79 bpm    Heart Rate (Exercise) 105 bpm    Heart Rate (Exit) 88 bpm    Oxygen Saturation (Admit) 98 %    Oxygen Saturation (Exercise) 92 %    Oxygen Saturation (Exit) 100 %    Rating of Perceived Exertion (Exercise) 11    Perceived Dyspnea (Exercise) 0    Symptoms none    Comments 6 MWT          Nutrition:  Target Goals: Understanding of nutrition guidelines, daily intake of sodium 1500mg , cholesterol 200mg , calories 30% from fat and 7% or less from saturated fats, daily to have 5 or more servings of fruits and vegetables.  Education: All About Nutrition: -Group instruction provided by verbal, written  material, interactive activities, discussions, models, and posters to present general guidelines for heart healthy nutrition including fat, fiber, MyPlate, the role of sodium in heart healthy nutrition, utilization of the nutrition label, and utilization of this knowledge for meal planning. Follow up email sent as well. Written material given at graduation. Flowsheet Row Cardiac Rehab from 07/18/2024 in [facility] Cardiac and Pulmonary Rehab  Education need identified 07/18/24    Biometrics:  Pre Biometrics - 07/18/24 1156       Pre Biometrics   Height 5' 5.5 (1.664 m)    Weight 115 lb 8 oz (52.4 kg)    Waist Circumference 33 inches    Hip Circumference 36 inches    Waist to Hip Ratio 0.92 %    BMI (Calculated) 18.92    Single Leg Stand 5.07 seconds           Nutrition Therapy Plan and Nutrition Goals:   Nutrition Assessments:  MEDIFICTS Score Key: >=70 Need to make dietary changes  40-70 Heart Healthy Diet <= 40 Therapeutic Level Cholesterol Diet  Flowsheet Row Pulmonary Rehab from 01/27/2022 in [facility] Cardiac and Pulmonary Rehab  Picture Your Plate Total Score on Admission 63  Picture Your Plate Total Score on Discharge 56   Picture Your Plate Scores: <59 Unhealthy dietary pattern with much room for improvement. 41-50 Dietary pattern unlikely to meet recommendations for good health and room for improvement. 51-60 More healthful dietary pattern, with some room for improvement.  >60 Healthy dietary pattern, although there may be some specific behaviors that could be improved.    Nutrition Goals Re-Evaluation:   Nutrition Goals Discharge (Final Nutrition Goals Re-Evaluation):   Psychosocial: Target Goals: Acknowledge presence or absence of significant depression and/or stress, maximize coping skills, provide positive support system. Participant is able to verbalize types and ability to use techniques and skills needed for reducing stress and depression.   Education: Stress,  Anxiety, and Depression - Group verbal and visual presentation to define topics covered.  Reviews how body is impacted by stress, anxiety, and depression.  Also discusses healthy ways to reduce stress and to treat/manage anxiety and depression.  Written material given at graduation.   Education: Sleep Hygiene -Provides group verbal and written instruction about how sleep can affect your health.  Define sleep hygiene, discuss sleep cycles and impact of sleep habits. Review good sleep hygiene tips.    Initial Review & Psychosocial Screening:  Initial Psych Review & Screening - 07/11/24 1043       Initial Review   Current issues with Current Stress Concerns;Current Psychotropic Meds    Source of Stress Concerns Chronic Illness      Family Dynamics   Good Support System? Yes      Barriers   Psychosocial barriers to participate in program There are no identifiable barriers or psychosocial needs.;The patient should benefit from training in stress management and relaxation.      Screening Interventions   Interventions To provide support and resources with identified psychosocial needs;Encouraged to exercise;Provide feedback about the scores to participant    Expected Outcomes Short Term goal: Utilizing psychosocial counselor, staff and physician to assist with identification of specific Stressors or current issues interfering with healing process. Setting desired goal for each stressor or current issue identified.;Long Term Goal: Stressors or current issues are controlled or eliminated.;Short Term goal: Identification and review with participant of any Quality of Life or Depression concerns found by scoring the questionnaire.;Long Term goal: The participant improves quality of Life and PHQ9 Scores as seen by post scores and/or verbalization of changes          Quality of Life Scores:   Quality of Life - 07/18/24 1201       Quality of Life   Select Quality of Life      Quality of Life  Scores   Health/Function Pre 8.25 %    Socioeconomic Pre 30 %    Psych/Spiritual Pre 22.5 %    Family Pre 30 %    GLOBAL Pre 16.4 %         Scores of 19 and below usually indicate a poorer quality of life in these areas.  A difference of  2-3 points is a clinically meaningful difference.  A difference of 2-3 points in the total score of the Quality of Life Index has been associated with significant improvement in overall quality of life, self-image, physical symptoms, and general health in studies assessing change in quality of life.  PHQ-9: Review Flowsheet       07/18/2024 01/27/2022 10/27/2021  Depression screen PHQ 2/9  Decreased Interest 1 0 0  Down, Depressed, Hopeless 2 0 0  PHQ - 2 Score 3 0 0  Altered sleeping 3 2 3   Tired, decreased energy 2 1 1   Change in appetite 3 2 3   Feeling bad or failure about yourself  2 0 0  Trouble concentrating 3 0 0  Moving slowly or fidgety/restless 3 2 0  Suicidal thoughts 0 0 0  PHQ-9 Score 19 7 7   Difficult doing work/chores Very difficult Not difficult at all Not difficult at all   Interpretation of Total Score  Total Score Depression Severity:  1-4 = Minimal depression, 5-9 = Mild depression, 10-14 = Moderate depression, 15-19 = Moderately severe depression, 20-27 = Severe depression   Psychosocial Evaluation and Intervention:  Psychosocial Evaluation - 07/11/24 1054       Psychosocial Evaluation & Interventions   Interventions Encouraged to exercise with the program and follow exercise prescription;Stress management education;Relaxation education    Comments Mr. Sollenberger is coming to cardiac rehab. 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 He recently got switched to pureed foods which he is not enjoying very much. His doctor has started him on Paxil to  help with his mental health symptoms that have increased since his health decline. His daughter was on the phone orientation and states that they are  hopeful this will help with his depression symptoms and stress. He reports no sleep concerns at this time. They are hoping he can gain some stamina during the program    Expected Outcomes Short: attend cardiac rehab for education and exercise Long: develop and maintain positive self care habits    Continue Psychosocial Services  Follow up required by staff          Psychosocial Re-Evaluation:   Psychosocial Discharge (Final Psychosocial Re-Evaluation):   Vocational Rehabilitation: Provide vocational rehab assistance to qualifying candidates.   Vocational Rehab Evaluation & Intervention:  Vocational Rehab - 07/11/24 1043       Initial Vocational Rehab Evaluation & Intervention   Assessment shows need for Vocational Rehabilitation No          Education: Education Goals: Education classes will be provided on a variety of topics geared toward better understanding of heart health and risk factor modification. Participant will state understanding/return demonstration of topics presented as noted by education test scores.  Learning Barriers/Preferences:  Learning Barriers/Preferences - 07/11/24 1149       Learning Barriers/Preferences   Learning Barriers --   nonverbal   Learning Preferences Individual Instruction          General Cardiac Education Topics:  AED/CPR: - Group verbal and written instruction with the use of models to demonstrate the basic use of the AED with the basic ABC's of resuscitation.   Anatomy and Cardiac Procedures: - Group verbal and visual presentation and models provide information about basic cardiac anatomy and function. Reviews the testing methods done to diagnose heart disease and the outcomes of the test results. Describes the treatment choices: Medical Management, Angioplasty, or Coronary Bypass Surgery for treating various heart conditions including Myocardial Infarction, Angina, Valve Disease, and Cardiac Arrhythmias.  Written material given  at graduation.   Medication Safety: - Group verbal and visual instruction to review commonly prescribed medications for heart and lung disease. Reviews the medication, class of the drug, and side effects. Includes the steps to properly store meds and maintain the prescription regimen.  Written material given at graduation. Flowsheet Row Pulmonary Rehab from 01/21/2022 in [facility] Cardiac and Pulmonary Rehab  Date 12/30/21  Educator SB  Instruction Review Code 1- Verbalizes Understanding    Intimacy: - Group verbal instruction through game format to discuss how heart and lung disease can affect sexual intimacy. Written material given at graduation.. Flowsheet Row Cardiac Rehab from 07/18/2024 in [facility] Cardiac and Pulmonary Rehab  Education need identified 07/18/24    Know Your Numbers and Heart Failure: - Group verbal and visual instruction to discuss disease risk factors for cardiac and pulmonary disease and treatment options.  Reviews associated critical values for Overweight/Obesity, Hypertension, Cholesterol, and Diabetes.  Discusses basics of heart failure: signs/symptoms and treatments.  Introduces Heart Failure Zone chart for action plan for heart failure.  Written material given at graduation. Flowsheet Row Cardiac Rehab from 07/18/2024 in [facility] Cardiac and Pulmonary Rehab  Education need identified 07/18/24    Infection Prevention: - Provides verbal and written material to individual with discussion of infection control including proper hand washing and proper equipment cleaning during exercise session. Flowsheet Row Cardiac Rehab from 07/18/2024 in [facility] Cardiac and Pulmonary Rehab  Date 07/18/24  Educator [facility]  Instruction Review Code 1- Verbalizes Understanding    Falls Prevention: - Provides  verbal and written material to individual with discussion of falls prevention and safety. Flowsheet Row Cardiac Rehab from 07/18/2024 in [facility] Cardiac and Pulmonary Rehab  Date 07/18/24  Educator [facility]   Instruction Review Code 1- Verbalizes Understanding    Other: -Provides group and verbal instruction on various topics (see comments)   Knowledge Questionnaire Score:  Knowledge Questionnaire Score - 07/18/24 1202       Knowledge Questionnaire Score   Pre Score 20/26          Core Components/Risk Factors/Patient Goals at Admission:  Personal Goals and Risk Factors at Admission - 07/18/24 1156       Core Components/Risk Factors/Patient Goals on Admission    Weight Management Yes;Weight Gain    Intervention Weight Management: Develop a combined nutrition and exercise program designed to reach desired caloric intake, while maintaining appropriate intake of nutrient and fiber, sodium and fats, and appropriate energy expenditure required for the weight goal.;Weight Management: Provide education and appropriate resources to help participant work on and attain dietary goals.;Weight Management/Obesity: Establish reasonable short term and long term weight goals.    Admit Weight 115 lb 8 oz (52.4 kg)    Goal Weight: Short Term 120 lb (54.4 kg)    Goal Weight: Long Term 130 lb (59 kg)    Expected Outcomes Short Term: Continue to assess and modify interventions until short term weight is achieved;Long Term: Adherence to nutrition and physical activity/exercise program aimed toward attainment of established weight goal;Understanding recommendations for meals to include 15-35% energy as protein, 25-35% energy from fat, 35-60% energy from carbohydrates, less than 200mg  of dietary cholesterol, 20-35 gm of total fiber daily;Weight Gain: Understanding of general recommendations for a high calorie, high protein meal plan that promotes weight gain by distributing calorie intake throughout the day with the consumption for 4-5 meals, snacks, and/or supplements;Understanding of distribution of calorie intake throughout the day with the consumption of 4-5 meals/snacks    Diabetes Yes    Intervention Provide  education about signs/symptoms and action to take for hypo/hyperglycemia.;Provide education about proper nutrition, including hydration, and aerobic/resistive exercise prescription along with prescribed medications to achieve blood glucose in normal ranges: Fasting glucose 65-99 mg/dL    Expected Outcomes Short Term: Participant verbalizes understanding of the signs/symptoms and immediate care of hyper/hypoglycemia, proper foot care and importance of medication, aerobic/resistive exercise and nutrition plan for blood glucose control.;Long Term: Attainment of HbA1C < 7%.    Heart Failure Yes    Intervention Provide a combined exercise and nutrition program that is supplemented with education, support and counseling about heart failure. Directed toward relieving symptoms such as shortness of breath, decreased exercise tolerance, and extremity edema.    Expected Outcomes Improve functional capacity of life;Short term: Attendance in program 2-3 days a week with increased exercise capacity. Reported lower sodium intake. Reported increased fruit and vegetable intake. Reports medication compliance.;Short term: Daily weights obtained and reported for increase. Utilizing diuretic protocols set by physician.;Long term: Adoption of self-care skills and reduction of barriers for early signs and symptoms recognition and intervention leading to self-care maintenance.    Hypertension Yes    Intervention Provide education on lifestyle modifcations including regular physical activity/exercise, weight management, moderate sodium restriction and increased consumption of fresh fruit, vegetables, and low fat dairy, alcohol moderation, and smoking cessation.;Monitor prescription use compliance.    Expected Outcomes Short Term: Continued assessment and intervention until BP is < 140/65mm HG in hypertensive participants. < 130/45mm HG in hypertensive participants with diabetes, heart  failure or chronic kidney disease.;Long Term:  Maintenance of blood pressure at goal levels.    Lipids Yes    Intervention Provide education and support for participant on nutrition & aerobic/resistive exercise along with prescribed medications to achieve LDL 70mg , HDL >40mg .    Expected Outcomes Short Term: Participant states understanding of desired cholesterol values and is compliant with medications prescribed. Participant is following exercise prescription and nutrition guidelines.;Long Term: Cholesterol controlled with medications as prescribed, with individualized exercise RX and with personalized nutrition plan. Value goals: LDL < 70mg , HDL > 40 mg.          Education:Diabetes - Individual verbal and written instruction to review signs/symptoms of diabetes, desired ranges of glucose level fasting, after meals and with exercise. Acknowledge that pre and post exercise glucose checks will be done for 3 sessions at entry of program. Flowsheet Row Cardiac Rehab from 07/18/2024 in [facility] Cardiac and Pulmonary Rehab  Date 07/18/24  Educator [facility]  Instruction Review Code 1- Verbalizes Understanding    Core Components/Risk Factors/Patient Goals Review:    Core Components/Risk Factors/Patient Goals at Discharge (Final Review):    ITP Comments:  ITP Comments     Row Name 07/11/24 1106 07/18/24 1136         ITP Comments Initial phone call completed. Diagnosis can be found in [facility]. EP Orientation scheduled for Wednesday 8/6 at 10am. Completed and gym orientation for cardiac rehab. Initial ITP created and sent for review to Dr. Oneil Pinal, Medical Director.         Comments: initial ITP

## 2024-07-23 ENCOUNTER — Encounter

## 2024-07-23 DIAGNOSIS — Z9861 Coronary angioplasty status: Secondary | ICD-10-CM

## 2024-07-23 NOTE — Progress Notes (Signed)
 Daily Session Note  Patient Details  Name: Todd Armstrong MRN: [id number] Date of Birth: [birth year] Referring Provider:   Flowsheet Row Cardiac Rehab from 07/18/2024 in [facility] Cardiac and Pulmonary Rehab  Referring Provider Dr. Shanna Sauce    Encounter Date: 07/23/2024  Check In:  Session Check In - 07/23/24 0933       Check-In   Supervising physician immediately available to respond to emergencies See telemetry face sheet for immediately available ER MD    Location [facility]-Cardiac & Pulmonary Rehab    Staff Present Burnard Davenport [facility], Exercise Physiologist;Joseph [facility] BS, ACSM CEP, Exercise Physiologist    Virtual Visit No    Medication changes reported     No    Fall or balance concerns reported    Yes    Comments fell Saturday 07/21/24    Warm-up and Cool-down Performed on first and last piece of equipment    Resistance Training Performed Yes    VAD Patient? No    PAD/SET Patient? No      Pain Assessment   Currently in Pain? No/denies             Social History   Tobacco Use  Smoking Status Former   Current packs/day: 0.00   Average packs/day: 1 pack/day for 8.0 years (8.0 ttl pk-yrs)   Types: Cigarettes   Start date: 09/12/1960   Quit date: 09/12/1968   Years since quitting: 55.8  Smokeless Tobacco Never  Tobacco Comments   quit smoking cigarettes > 40 years ago    Goals Met:  Independence with exercise equipment Exercise tolerated well No report of concerns or symptoms today Strength training completed today  Goals Unmet:  Not Applicable  Comments: First full day of exercise!  Patient was oriented to gym and equipment including functions, settings, policies, and procedures.  Patient's individual exercise prescription and treatment plan were reviewed.  All starting workloads were established based on the results of the 6 minute walk test done at initial orientation visit.  The plan for exercise progression was  also introduced and progression will be customized based on patient's performance and goals.    Dr. Oneil Pinal is Medical Director for [facility] Cardiac Rehabilitation.  Dr. Fuad Aleskerov is Medical Director for [facility] Pulmonary Rehabilitation.

## 2024-07-25 ENCOUNTER — Encounter

## 2024-07-25 DIAGNOSIS — Z9861 Coronary angioplasty status: Secondary | ICD-10-CM

## 2024-07-25 NOTE — Progress Notes (Signed)
 Daily Session Note  Patient Details  Name: Todd Armstrong MRN: [id number] Date of Birth: 1931-07-31 Referring Provider:   Flowsheet Row Cardiac Rehab from 07/18/2024 in [facility] Cardiac and Pulmonary Rehab  Referring Provider Dr. Shanna Sauce    Encounter Date: 07/25/2024  Check In:  Session Check In - 07/25/24 0910       Check-In   Supervising physician immediately available to respond to emergencies See telemetry face sheet for immediately available ER MD    Location [facility]-Cardiac & Pulmonary Rehab    Staff Present Fairy Plater RCP,RRT,BSRT;Margaret Best, MS, Exercise Physiologist;Noah Tickle, BS, Exercise Physiologist;Kelly [facility]    Virtual Visit No    Medication changes reported     No    Fall or balance concerns reported    No    Tobacco Cessation No Change    Warm-up and Cool-down Performed on first and last piece of equipment    Resistance Training Performed Yes    VAD Patient? No    PAD/SET Patient? No      Pain Assessment   Currently in Pain? No/denies             Social History   Tobacco Use  Smoking Status Former   Current packs/day: 0.00   Average packs/day: 1 pack/day for 8.0 years (8.0 ttl pk-yrs)   Types: Cigarettes   Start date: 09/12/1960   Quit date: 09/12/1968   Years since quitting: 55.9  Smokeless Tobacco Never  Tobacco Comments   quit smoking cigarettes > 40 years ago    Goals Met:  Independence with exercise equipment Exercise tolerated well No report of concerns or symptoms today Strength training completed today  Goals Unmet:  Not Applicable  Comments: Pt able to follow exercise prescription today without complaint.  Will continue to monitor for progression.    Dr. Oneil Pinal is Medical Director for [facility] Cardiac Rehabilitation.  Dr. Fuad Aleskerov is Medical Director for [facility] Pulmonary Rehabilitation.

## 2024-07-27 ENCOUNTER — Encounter

## 2024-07-27 DIAGNOSIS — Z9861 Coronary angioplasty status: Secondary | ICD-10-CM

## 2024-07-27 NOTE — Progress Notes (Signed)
 Daily Session Note  Patient Details  Name: REVEL STELLMACH MRN: [id number] Date of Birth: October 14, 1931 Referring Provider:   Flowsheet Row Cardiac Rehab from 07/18/2024 in [facility] Cardiac and Pulmonary Rehab  Referring Provider Dr. Shanna Sauce    Encounter Date: 07/27/2024  Check In:  Session Check In - 07/27/24 0858       Check-In   Supervising physician immediately available to respond to emergencies See telemetry face sheet for immediately available ER MD    Location [facility]-Cardiac & Pulmonary Rehab    Staff Present Burnard Davenport RN,BSN,MPA;Joseph Hood RCP,RRT,BSRT;Noah Tickle, [place], Exercise Physiologist;Maxon Conetta BS, Exercise Physiologist    Virtual Visit No    Medication changes reported     No    Fall or balance concerns reported    No    Tobacco Cessation No Change    Warm-up and Cool-down Performed on first and last piece of equipment    Resistance Training Performed Yes    VAD Patient? No    PAD/SET Patient? No      Pain Assessment   Currently in Pain? No/denies             Social History   Tobacco Use  Smoking Status Former   Current packs/day: 0.00   Average packs/day: 1 pack/day for 8.0 years (8.0 ttl pk-yrs)   Types: Cigarettes   Start date: 09/12/1960   Quit date: 09/12/1968   Years since quitting: 55.9  Smokeless Tobacco Never  Tobacco Comments   quit smoking cigarettes > 40 years ago    Goals Met:  Independence with exercise equipment Exercise tolerated well No report of concerns or symptoms today Strength training completed today  Goals Unmet:  Not Applicable  Comments: Pt able to follow exercise prescription today without complaint.  Will continue to monitor for progression.    Dr. Oneil Pinal is Medical Director for [facility] Cardiac Rehabilitation.  Dr. Fuad Aleskerov is Medical Director for [facility] Pulmonary Rehabilitation.

## 2024-07-30 ENCOUNTER — Encounter

## 2024-07-30 DIAGNOSIS — Z9861 Coronary angioplasty status: Secondary | ICD-10-CM

## 2024-07-30 NOTE — Progress Notes (Signed)
 Daily Session Note  Patient Details  Name: Todd Armstrong MRN: [id number] Date of Birth: [birth year] Referring Provider:   Flowsheet Row Cardiac Rehab from 07/18/2024 in [facility] Cardiac and Pulmonary Rehab  Referring Provider Dr. Shanna Sauce    Encounter Date: 07/30/2024  Check In:  Session Check In - 07/30/24 0912       Check-In   Supervising physician immediately available to respond to emergencies See telemetry face sheet for immediately available ER MD    Location [facility]-Cardiac & Pulmonary Rehab    Staff Present Burnard Davenport RN,BSN,MPA;Joseph [facility] BS, ACSM CEP, Exercise Physiologist;Lauren Cates RN,BSN    Virtual Visit No    Medication changes reported     No    Fall or balance concerns reported    No    Tobacco Cessation No Change    Warm-up and Cool-down Performed on first and last piece of equipment    Resistance Training Performed Yes    VAD Patient? No    PAD/SET Patient? No      Pain Assessment   Currently in Pain? No/denies             Social History   Tobacco Use  Smoking Status Former   Current packs/day: 0.00   Average packs/day: 1 pack/day for 8.0 years (8.0 ttl pk-yrs)   Types: Cigarettes   Start date: 09/12/1960   Quit date: 09/12/1968   Years since quitting: 55.9  Smokeless Tobacco Never  Tobacco Comments   quit smoking cigarettes > 40 years ago    Goals Met:  Independence with exercise equipment Exercise tolerated well No report of concerns or symptoms today Strength training completed today  Goals Unmet:  Not Applicable  Comments: Pt able to follow exercise prescription today without complaint.  Will continue to monitor for progression.    Dr. Oneil Pinal is Medical Director for [facility] Cardiac Rehabilitation.  Dr. Fuad Aleskerov is Medical Director for [facility] Pulmonary Rehabilitation.

## 2024-08-01 ENCOUNTER — Encounter

## 2024-08-01 DIAGNOSIS — Z955 Presence of coronary angioplasty implant and graft: Secondary | ICD-10-CM

## 2024-08-01 DIAGNOSIS — Z9861 Coronary angioplasty status: Secondary | ICD-10-CM | POA: Diagnosis not present

## 2024-08-01 NOTE — Progress Notes (Signed)
 Daily Session Note  Patient Details  Name: Todd Armstrong MRN: [id number] Date of Birth: 18-Mar-1931 Referring Provider:   Flowsheet Row Cardiac Rehab from 07/18/2024 in [facility] Cardiac and Pulmonary Rehab  Referring Provider Dr. Shanna Sauce    Encounter Date: 08/01/2024  Check In:  Session Check In - 08/01/24 0907       Check-In   Supervising physician immediately available to respond to emergencies See telemetry face sheet for immediately available ER MD    Location [facility]-Cardiac & Pulmonary Rehab    Staff Present Burnard Davenport RN,BSN,MPA;Maxon Conetta BS, Exercise Physiologist;Joseph Rolinda RCP,RRT,BSRT;Jason Elnor RDN,LDN    Virtual Visit No    Medication changes reported     No    Fall or balance concerns reported    No    Tobacco Cessation No Change    Warm-up and Cool-down Performed on first and last piece of equipment    Resistance Training Performed Yes    VAD Patient? No    PAD/SET Patient? No      Pain Assessment   Currently in Pain? No/denies             Social History   Tobacco Use  Smoking Status Former   Current packs/day: 0.00   Average packs/day: 1 pack/day for 8.0 years (8.0 ttl pk-yrs)   Types: Cigarettes   Start date: 09/12/1960   Quit date: 09/12/1968   Years since quitting: 55.9  Smokeless Tobacco Never  Tobacco Comments   quit smoking cigarettes > 40 years ago    Goals Met:  Independence with exercise equipment Exercise tolerated well No report of concerns or symptoms today Strength training completed today  Goals Unmet:  Not Applicable  Comments: Pt able to follow exercise prescription today without complaint.  Will continue to monitor for progression.    Dr. Oneil Pinal is Medical Director for [facility] Cardiac Rehabilitation.  Dr. Fuad Aleskerov is Medical Director for [facility] Pulmonary Rehabilitation.

## 2024-08-03 ENCOUNTER — Encounter

## 2024-08-03 DIAGNOSIS — Z9861 Coronary angioplasty status: Secondary | ICD-10-CM | POA: Diagnosis not present

## 2024-08-03 DIAGNOSIS — Z955 Presence of coronary angioplasty implant and graft: Secondary | ICD-10-CM

## 2024-08-03 NOTE — Progress Notes (Signed)
 Daily Session Note  Patient Details  Name: Todd Armstrong MRN: [id number] Date of Birth: 1931-03-19 Referring Provider:   Flowsheet Row Cardiac Rehab from 07/18/2024 in [facility] Cardiac and Pulmonary Rehab  Referring Provider Dr. Shanna Sauce    Encounter Date: 08/03/2024  Check In:  Session Check In - 08/03/24 0858       Check-In   Supervising physician immediately available to respond to emergencies See telemetry face sheet for immediately available ER MD    Location [facility]-Cardiac & Pulmonary Rehab    Staff Present Burnard Davenport RN,BSN,MPA;Joseph Hood RCP,RRT,BSRT;Maxon Conetta BS, Exercise Physiologist;Noah Tickle, BS, Exercise Physiologist    Virtual Visit No    Medication changes reported     No    Fall or balance concerns reported    No    Tobacco Cessation No Change    Warm-up and Cool-down Performed on first and last piece of equipment    Resistance Training Performed Yes    VAD Patient? No    PAD/SET Patient? No      Pain Assessment   Currently in Pain? No/denies             Social History   Tobacco Use  Smoking Status Former   Current packs/day: 0.00   Average packs/day: 1 pack/day for 8.0 years (8.0 ttl pk-yrs)   Types: Cigarettes   Start date: 09/12/1960   Quit date: 09/12/1968   Years since quitting: 55.9  Smokeless Tobacco Never  Tobacco Comments   quit smoking cigarettes > 40 years ago    Goals Met:  Independence with exercise equipment Exercise tolerated well No report of concerns or symptoms today Strength training completed today  Goals Unmet:  Not Applicable  Comments: Pt able to follow exercise prescription today without complaint.  Will continue to monitor for progression.    Dr. Oneil Pinal is Medical Director for [facility] Cardiac Rehabilitation.  Dr. Fuad Aleskerov is Medical Director for [facility] Pulmonary Rehabilitation.

## 2024-08-06 ENCOUNTER — Encounter

## 2024-08-06 DIAGNOSIS — Z955 Presence of coronary angioplasty implant and graft: Secondary | ICD-10-CM

## 2024-08-06 DIAGNOSIS — Z9861 Coronary angioplasty status: Secondary | ICD-10-CM | POA: Diagnosis not present

## 2024-08-06 NOTE — Progress Notes (Signed)
 Daily Session Note  Patient Details  Name: Todd Armstrong MRN: [id number] Date of Birth: [birth year] Referring Provider:   Flowsheet Row Cardiac Rehab from 07/18/2024 in [facility] Cardiac and Pulmonary Rehab  Referring Provider Dr. Shanna Sauce    Encounter Date: 08/06/2024  Check In:  Session Check In - 08/06/24 0904       Check-In   Supervising physician immediately available to respond to emergencies See telemetry face sheet for immediately available ER MD    Location [facility]-Cardiac & Pulmonary Rehab    Staff Present Burnard Davenport RN,BSN,MPA;Joseph Rolinda RCP,RRT,BSRT;Laura Cates RN,BSN;Keajah Killough Dyane BS, ACSM CEP, Exercise Physiologist;Jason Elnor RDN,LDN    Virtual Visit No    Medication changes reported     No    Fall or balance concerns reported    No    Tobacco Cessation No Change    Warm-up and Cool-down Performed on first and last piece of equipment    Resistance Training Performed Yes    VAD Patient? No    PAD/SET Patient? No      Pain Assessment   Currently in Pain? No/denies             Social History   Tobacco Use  Smoking Status Former   Current packs/day: 0.00   Average packs/day: 1 pack/day for 8.0 years (8.0 ttl pk-yrs)   Types: Cigarettes   Start date: 09/12/1960   Quit date: 09/12/1968   Years since quitting: 55.9  Smokeless Tobacco Never  Tobacco Comments   quit smoking cigarettes > 40 years ago    Goals Met:  Independence with exercise equipment Exercise tolerated well No report of concerns or symptoms today Strength training completed today  Goals Unmet:  Not Applicable  Comments: Pt able to follow exercise prescription today without complaint.  Will continue to monitor for progression.    Dr. Oneil Pinal is Medical Director for [facility] Cardiac Rehabilitation.  Dr. Fuad Aleskerov is Medical Director for [facility] Pulmonary Rehabilitation.

## 2024-08-07 ENCOUNTER — Encounter: Admitting: *Deleted

## 2024-08-07 DIAGNOSIS — Z955 Presence of coronary angioplasty implant and graft: Secondary | ICD-10-CM

## 2024-08-07 DIAGNOSIS — Z9861 Coronary angioplasty status: Secondary | ICD-10-CM | POA: Diagnosis not present

## 2024-08-07 NOTE — Progress Notes (Signed)
 Daily Session Note  Patient Details  Name: Todd Armstrong MRN: [id number] Date of Birth: 02-25-1931 Referring Provider:   Flowsheet Row Cardiac Rehab from 07/18/2024 in [facility] Cardiac and Pulmonary Rehab  Referring Provider Dr. Shanna Sauce    Encounter Date: 08/07/2024  Check In:  Session Check In - 08/07/24 0940       Check-In   Supervising physician immediately available to respond to emergencies See telemetry face sheet for immediately available ER MD    Location [facility]-Cardiac & Pulmonary Rehab    Staff Present Bruno Mirza RN,BSN;Jason Elnor RDN,LDN;Maxon Conetta BS, Exercise Physiologist;Margaret Best, MS, Exercise Physiologist    Virtual Visit No    Medication changes reported     No    Fall or balance concerns reported    No    Tobacco Cessation No Change    Warm-up and Cool-down Performed on first and last piece of equipment    Resistance Training Performed Yes    VAD Patient? No    PAD/SET Patient? No      Pain Assessment   Currently in Pain? No/denies             Social History   Tobacco Use  Smoking Status Former   Current packs/day: 0.00   Average packs/day: 1 pack/day for 8.0 years (8.0 ttl pk-yrs)   Types: Cigarettes   Start date: 09/12/1960   Quit date: 09/12/1968   Years since quitting: 55.9  Smokeless Tobacco Never  Tobacco Comments   quit smoking cigarettes > 40 years ago    Goals Met:  Independence with exercise equipment Exercise tolerated well No report of concerns or symptoms today Strength training completed today  Goals Unmet:  Not Applicable  Comments: Pt able to follow exercise prescription today without complaint.  Will continue to monitor for progression.    Dr. Oneil Pinal is Medical Director for [facility] Cardiac Rehabilitation.  Dr. Fuad Aleskerov is Medical Director for [facility] Pulmonary Rehabilitation.

## 2024-08-08 ENCOUNTER — Encounter: Admitting: Emergency Medicine

## 2024-08-08 DIAGNOSIS — Z9861 Coronary angioplasty status: Secondary | ICD-10-CM

## 2024-08-08 DIAGNOSIS — Z955 Presence of coronary angioplasty implant and graft: Secondary | ICD-10-CM

## 2024-08-08 NOTE — Progress Notes (Signed)
 Cardiac Individual Treatment Plan  Patient Details  Name: Todd Armstrong MRN: [id number] Date of Birth: 1931-08-04 Referring Provider:   Flowsheet Row Cardiac Rehab from 07/18/2024 in [facility] Cardiac and Pulmonary Rehab  Referring Provider Dr. Shanna Sauce    Initial Encounter Date:  Flowsheet Row Cardiac Rehab from 07/18/2024 in [facility] Cardiac and Pulmonary Rehab  Date 07/18/24    Visit Diagnosis: Status post coronary artery stent placement  History of percutaneous coronary intervention  Patient's Home Medications on Admission:  Current Outpatient Medications:    Accu-Chek FastClix Lancets MISC, For use when checking blood sugars, Disp: , Rfl:    albuterol  (VENTOLIN  HFA) 108 (90 Base) MCG/ACT inhaler, Inhale 2 puffs into the lungs every 6 (six) hours as needed for wheezing or shortness of breath., Disp: 8 g, Rfl: 6   apixaban  (ELIQUIS ) 2.5 MG TABS tablet, Take 2.5 mg by mouth 2 (two) times daily. Per patient's daughter Dr. Charlott switched Apixaban  (Eliquis ) from 5 mg twice a day. Patient is now taking Apixaban  (Eliquis )  2.5 mg twice a day., Disp: , Rfl:    clopidogrel  (PLAVIX ) 75 MG tablet, TAKE 1 TABLET BY MOUTH DAILY, Disp: 100 tablet, Rfl: 3   Dextromethorphan  HBr 15 MG/5ML LIQD, Take 5 mLs (15 mg total) by mouth 3 (three) times daily as needed., Disp: 118 mL, Rfl: 0   metFORMIN  (GLUCOPHAGE ) 1000 MG tablet, Take 1,000 mg by mouth 2 (two) times daily with a meal. (Patient not taking: Reported on 07/11/2024), Disp: , Rfl:    metoprolol  succinate (TOPROL -XL) 25 MG 24 hr tablet, Take 25 mg by mouth daily., Disp: , Rfl:    metoprolol  tartrate (LOPRESSOR ) 25 MG tablet, Take 0.5 tablets (12.5 mg total) by mouth 2 (two) times daily. (Patient not taking: Reported on 07/11/2024), Disp: 30 tablet, Rfl: 0   nitroGLYCERIN  (NITROSTAT ) 0.4 MG SL tablet, Place 1 tablet (0.4 mg total) under the tongue every 5 (five) minutes as needed for chest pain., Disp: 25 tablet, Rfl: 6   PARoxetine (PAXIL) 20 MG  tablet, Take by mouth., Disp: , Rfl:    potassium chloride  (KLOR-CON ) 10 MEQ tablet, Take 1 tablet (10 mEq total) by mouth 2 (two) times daily., Disp: 60 tablet, Rfl: 0   rosuvastatin  (CRESTOR ) 10 MG tablet, TAKE 1 TABLET BY MOUTH DAILY, Disp: 100 tablet, Rfl: 1   tamsulosin  (FLOMAX ) 0.4 MG CAPS capsule, Take 0.4 mg by mouth at bedtime. , Disp: , Rfl:    torsemide  (DEMADEX ) 20 MG tablet, Take 1/2 tablet by mouth only on Mondays, Wednesdays, and Fridays, Disp: 12 tablet, Rfl: 6   triamcinolone cream (KENALOG) 0.1 %, Apply 1 application topically as needed (for itching)., Disp: , Rfl:   Past Medical History: Past Medical History:  Diagnosis Date   Acute myocardial infarction    Anemia    Aortic insufficiency    Carotid stenosis    Chronic combined systolic and diastolic CHF (congestive heart failure) (HCC)    Coronary artery disease    PTCA and stenting of his right coronary artery. 3.5 x 16-mm Liberte stent.               Diabetes mellitus without complication (HCC)    Dyslipidemia    Emphysema (subcutaneous) (surgical) resulting from a procedure    Hyperlipemia    Hypertension    Mitral regurgitation    Partial tear of rotator cuff    right shoulder   Peripheral vascular disease (HCC)    Persistent atrial fibrillation (HCC)  Pulmonary hypertension (HCC)    Stroke (HCC)    Wears dentures     Tobacco Use: Social History   Tobacco Use  Smoking Status Former   Current packs/day: 0.00   Average packs/day: 1 pack/day for 8.0 years (8.0 ttl pk-yrs)   Types: Cigarettes   Start date: 09/12/1960   Quit date: 09/12/1968   Years since quitting: 55.9  Smokeless Tobacco Never  Tobacco Comments   quit smoking cigarettes > 40 years ago    Labs: Review Flowsheet  More data exists      Latest Ref Rng & Units 02/19/2019 04/22/2020 06/03/2021 09/28/2021 09/09/2022  Labs for ITP Cardiac and Pulmonary Rehab  Cholestrol 100 - 199 mg/dL 867  851  868  - 862   LDL (calc) 0 - 99 mg/dL 37  52   38  - 33   HDL-C >39 mg/dL 76  75  79  - 88   Trlycerides 0 - 149 mg/dL 96  880  72  - 83   Hemoglobin A1c 4.8 - 5.6 % - - - 6.3  -     Exercise Target Goals: Exercise Program Goal: Individual exercise prescription set using results from initial 6 min walk test and THRR while considering  patient's activity barriers and safety.   Exercise Prescription Goal: Initial exercise prescription builds to 30-45 minutes a day of aerobic activity, 2-3 days per week.  Home exercise guidelines will be given to patient during program as part of exercise prescription that the participant will acknowledge.   Education: Aerobic Exercise: - Group verbal and visual presentation on the components of exercise prescription. Introduces F.I.T.T principle from ACSM for exercise prescriptions.  Reviews F.I.T.T. principles of aerobic exercise including progression. Written material provided at class time. Flowsheet Row Cardiac Rehab from 07/18/2024 in [facility] Cardiac and Pulmonary Rehab  Education need identified 07/18/24    Education: Resistance Exercise: - Group verbal and visual presentation on the components of exercise prescription. Introduces F.I.T.T principle from ACSM for exercise prescriptions  Reviews F.I.T.T. principles of resistance exercise including progression. Written material provided at class time.    Education: Exercise & Equipment Safety: - Individual verbal instruction and demonstration of equipment use and safety with use of the equipment. Flowsheet Row Cardiac Rehab from 07/18/2024 in [facility] Cardiac and Pulmonary Rehab  Date 07/18/24  Educator [facility]  Instruction Review Code 1- Verbalizes Understanding    Education: Exercise Physiology & General Exercise Guidelines: - Group verbal and written instruction with models to review the exercise physiology of the cardiovascular system and associated critical values. Provides general exercise guidelines with specific guidelines to those with heart or lung  disease. Written material provided at class time. Flowsheet Row Cardiac Rehab from 07/18/2024 in [facility] Cardiac and Pulmonary Rehab  Education need identified 07/18/24    Education: Flexibility, Balance, Mind/Body Relaxation: - Group verbal and visual presentation with interactive activity on the components of exercise prescription. Introduces F.I.T.T principle from ACSM for exercise prescriptions. Reviews F.I.T.T. principles of flexibility and balance exercise training including progression. Also discusses the mind body connection.  Reviews various relaxation techniques to help reduce and manage stress (i.e. Deep breathing, progressive muscle relaxation, and visualization). Balance handout provided to take home. Written material provided at class time. Flowsheet Row Pulmonary Rehab from 01/21/2022 in [facility] Cardiac and Pulmonary Rehab  Date 12/23/21  Educator AS  Instruction Review Code 1- Verbalizes Understanding    Activity Barriers & Risk Stratification:  Activity Barriers & Cardiac Risk Stratification - 07/18/24 1145  Activity Barriers & Cardiac Risk Stratification   Activity Barriers Back Problems;Neck/Spine Problems;Balance Concerns;History of Falls    Cardiac Risk Stratification High          6 Minute Walk:  6 Minute Walk     Row Name 07/18/24 1143         6 Minute Walk   Phase Initial     Distance 700 feet     Walk Time 6 minutes     # of Rest Breaks 0     MPH 1.33     METS 1.35     RPE 11     Perceived Dyspnea  0     VO2 Peak 4.73     Symptoms No     Resting HR 79 bpm     Resting BP 124/80     Resting Oxygen Saturation  98 %     Exercise Oxygen Saturation  during 6 min walk 92 %     Max Ex. HR 105 bpm     Max Ex. BP 150/80     2 Minute Post BP 124/82        Oxygen Initial Assessment:   Oxygen Re-Evaluation:   Oxygen Discharge (Final Oxygen Re-Evaluation):   Initial Exercise Prescription:  Initial Exercise Prescription - 07/18/24 1100       Date  of Initial Exercise RX and Referring Provider   Date 07/18/24    Referring Provider Dr. Shanna Sauce      Oxygen   Maintain Oxygen Saturation 88% or higher      Recumbant Bike   Level 1    RPM 50    Watts 15    Minutes 15    METs 1.35      NuStep   Level 1    SPM 80    Minutes 15    METs 1.35      Biostep-RELP   Level 1    SPM 50    Minutes 15    METs 1.35      Track   Laps 10    Minutes 15    METs 1.54      Prescription Details   Frequency (times per week) 3    Duration Progress to 30 minutes of continuous aerobic without signs/symptoms of physical distress      Intensity   THRR 40-80% of Max Heartrate 99-119    Ratings of Perceived Exertion 11-13    Perceived Dyspnea 0-4      Progression   Progression Continue to progress workloads to maintain intensity without signs/symptoms of physical distress.      Resistance Training   Training Prescription Yes    Weight 2    Reps 10-15          Perform Capillary Blood Glucose checks as needed.  Exercise Prescription Changes:   Exercise Prescription Changes     Row Name 07/18/24 1100             Response to Exercise   Blood Pressure (Admit) 124/80       Blood Pressure (Exercise) 150/80       Blood Pressure (Exit) 124/82       Heart Rate (Admit) 79 bpm       Heart Rate (Exercise) 105 bpm       Heart Rate (Exit) 88 bpm       Oxygen Saturation (Admit) 98 %       Oxygen Saturation (Exercise) 92 %  Oxygen Saturation (Exit) 100 %       Rating of Perceived Exertion (Exercise) 11       Perceived Dyspnea (Exercise) 0       Symptoms none       Comments 6 MWT          Exercise Comments:   Exercise Comments     Row Name 07/23/24 0935           Exercise Comments First full day of exercise!  Patient was oriented to gym and equipment including functions, settings, policies, and procedures.  Patient's individual exercise prescription and treatment plan were reviewed.  All starting workloads were  established based on the results of the 6 minute walk test done at initial orientation visit.  The plan for exercise progression was also introduced and progression will be customized based on patient's performance and goals.          Exercise Goals and Review:   Exercise Goals     Row Name 07/18/24 1155             Exercise Goals   Increase Physical Activity Yes       Intervention Provide advice, education, support and counseling about physical activity/exercise needs.;Develop an individualized exercise prescription for aerobic and resistive training based on initial evaluation findings, risk stratification, comorbidities and participant's personal goals.       Expected Outcomes Short Term: Attend rehab on a regular basis to increase amount of physical activity.;Long Term: Add in home exercise to make exercise part of routine and to increase amount of physical activity.;Long Term: Exercising regularly at least 3-5 days a week.       Increase Strength and Stamina Yes       Intervention Provide advice, education, support and counseling about physical activity/exercise needs.;Develop an individualized exercise prescription for aerobic and resistive training based on initial evaluation findings, risk stratification, comorbidities and participant's personal goals.       Expected Outcomes Short Term: Increase workloads from initial exercise prescription for resistance, speed, and METs.;Short Term: Perform resistance training exercises routinely during rehab and add in resistance training at home;Long Term: Improve cardiorespiratory fitness, muscular endurance and strength as measured by increased METs and functional capacity ( )       Able to understand and use rate of perceived exertion (RPE) scale Yes       Intervention Provide education and explanation on how to use RPE scale       Expected Outcomes Short Term: Able to use RPE daily in rehab to express subjective intensity level;Long Term:   Able to use RPE to guide intensity level when exercising independently       Able to understand and use Dyspnea scale Yes       Intervention Provide education and explanation on how to use Dyspnea scale       Expected Outcomes Short Term: Able to use Dyspnea scale daily in rehab to express subjective sense of shortness of breath during exertion;Long Term: Able to use Dyspnea scale to guide intensity level when exercising independently       Knowledge and understanding of Target Heart Rate Range (THRR) Yes       Intervention Provide education and explanation of THRR including how the numbers were predicted and where they are located for reference       Expected Outcomes Short Term: Able to state/look up THRR;Long Term: Able to use THRR to govern intensity when exercising independently;Short Term: Able to use daily  as guideline for intensity in rehab       Able to check pulse independently Yes       Intervention Provide education and demonstration on how to check pulse in carotid and radial arteries.;Review the importance of being able to check your own pulse for safety during independent exercise       Expected Outcomes Short Term: Able to explain why pulse checking is important during independent exercise;Long Term: Able to check pulse independently and accurately       Understanding of Exercise Prescription Yes       Intervention Provide education, explanation, and written materials on patient's individual exercise prescription       Expected Outcomes Short Term: Able to explain program exercise prescription;Long Term: Able to explain home exercise prescription to exercise independently          Exercise Goals Re-Evaluation :  Exercise Goals Re-Evaluation     Row Name 07/23/24 0936             Exercise Goal Re-Evaluation   Exercise Goals Review Increase Physical Activity;Able to understand and use rate of perceived exertion (RPE) scale;Knowledge and understanding of Target Heart Rate Range  (THRR);Understanding of Exercise Prescription;Increase Strength and Stamina;Able to understand and use Dyspnea scale;Able to check pulse independently       Comments Reviewed RPE and dyspnea scale, THR and program prescription with pt today.  Pt voiced understanding and was given a copy of goals to take home.       Expected Outcomes Short: Use RPE daily to regulate intensity. Long: Follow program prescription in THR.          Discharge Exercise Prescription (Final Exercise Prescription Changes):  Exercise Prescription Changes - 07/18/24 1100       Response to Exercise   Blood Pressure (Admit) 124/80    Blood Pressure (Exercise) 150/80    Blood Pressure (Exit) 124/82    Heart Rate (Admit) 79 bpm    Heart Rate (Exercise) 105 bpm    Heart Rate (Exit) 88 bpm    Oxygen Saturation (Admit) 98 %    Oxygen Saturation (Exercise) 92 %    Oxygen Saturation (Exit) 100 %    Rating of Perceived Exertion (Exercise) 11    Perceived Dyspnea (Exercise) 0    Symptoms none    Comments 6 MWT          Nutrition:  Target Goals: Understanding of nutrition guidelines, daily intake of sodium 1500mg , cholesterol 200mg , calories 30% from fat and 7% or less from saturated fats, daily to have 5 or more servings of fruits and vegetables.  Education: Nutrition 1 -Group instruction provided by verbal, written material, interactive activities, discussions, models, and posters to present general guidelines for heart healthy nutrition including macronutrients, label reading, and promoting whole foods over processed counterparts. Education serves as Pensions consultant of discussion of heart healthy eating for all. Written material provided at class time.    Education: Nutrition 2 -Group instruction provided by verbal, written material, interactive activities, discussions, models, and posters to present general guidelines for heart healthy nutrition including sodium, cholesterol, and saturated fat. Providing guidance of  habit forming to improve blood pressure, cholesterol, and body weight. Written material provided at class time.     Biometrics:  Pre Biometrics - 07/18/24 1156       Pre Biometrics   Height 5' 5.5 (1.664 m)    Weight 115 lb 8 oz (52.4 kg)    Waist Circumference 33 inches  Hip Circumference 36 inches    Waist to Hip Ratio 0.92 %    BMI (Calculated) 18.92    Single Leg Stand 5.07 seconds           Nutrition Therapy Plan and Nutrition Goals:   Nutrition Assessments:  MEDIFICTS Score Key: >=70 Need to make dietary changes  40-70 Heart Healthy Diet <= 40 Therapeutic Level Cholesterol Diet  Flowsheet Row Pulmonary Rehab from 01/27/2022 in [facility] Cardiac and Pulmonary Rehab  Picture Your Plate Total Score on Admission 63  Picture Your Plate Total Score on Discharge 56   Picture Your Plate Scores: <59 Unhealthy dietary pattern with much room for improvement. 41-50 Dietary pattern unlikely to meet recommendations for good health and room for improvement. 51-60 More healthful dietary pattern, with some room for improvement.  >60 Healthy dietary pattern, although there may be some specific behaviors that could be improved.    Nutrition Goals Re-Evaluation:   Nutrition Goals Discharge (Final Nutrition Goals Re-Evaluation):   Psychosocial: Target Goals: Acknowledge presence or absence of significant depression and/or stress, maximize coping skills, provide positive support system. Participant is able to verbalize types and ability to use techniques and skills needed for reducing stress and depression.   Education: Stress, Anxiety, and Depression - Group verbal and visual presentation to define topics covered.  Reviews how body is impacted by stress, anxiety, and depression.  Also discusses healthy ways to reduce stress and to treat/manage anxiety and depression. Written material provided at class time.   Education: Sleep Hygiene -Provides group verbal and written  instruction about how sleep can affect your health.  Define sleep hygiene, discuss sleep cycles and impact of sleep habits. Review good sleep hygiene tips.   Initial Review & Psychosocial Screening:  Initial Psych Review & Screening - 07/11/24 1043       Initial Review   Current issues with Current Stress Concerns;Current Psychotropic Meds    Source of Stress Concerns Chronic Illness      Family Dynamics   Good Support System? Yes      Barriers   Psychosocial barriers to participate in program There are no identifiable barriers or psychosocial needs.;The patient should benefit from training in stress management and relaxation.      Screening Interventions   Interventions To provide support and resources with identified psychosocial needs;Encouraged to exercise;Provide feedback about the scores to participant    Expected Outcomes Short Term goal: Utilizing psychosocial counselor, staff and physician to assist with identification of specific Stressors or current issues interfering with healing process. Setting desired goal for each stressor or current issue identified.;Long Term Goal: Stressors or current issues are controlled or eliminated.;Short Term goal: Identification and review with participant of any Quality of Life or Depression concerns found by scoring the questionnaire.;Long Term goal: The participant improves quality of Life and PHQ9 Scores as seen by post scores and/or verbalization of changes          Quality of Life Scores:   Quality of Life - 07/18/24 1201       Quality of Life   Select Quality of Life      Quality of Life Scores   Health/Function Pre 8.25 %    Socioeconomic Pre 30 %    Psych/Spiritual Pre 22.5 %    Family Pre 30 %    GLOBAL Pre 16.4 %         Scores of 19 and below usually indicate a poorer quality of life in these areas.  A  difference of  2-3 points is a clinically meaningful difference.  A difference of 2-3 points in the total score of the  Quality of Life Index has been associated with significant improvement in overall quality of life, self-image, physical symptoms, and general health in studies assessing change in quality of life.  PHQ-9: Review Flowsheet       07/18/2024 01/27/2022 10/27/2021  Depression screen PHQ 2/9  Decreased Interest 1 0 0  Down, Depressed, Hopeless 2 0 0  PHQ - 2 Score 3 0 0  Altered sleeping 3 2 3   Tired, decreased energy 2 1 1   Change in appetite 3 2 3   Feeling bad or failure about yourself  2 0 0  Trouble concentrating 3 0 0  Moving slowly or fidgety/restless 3 2 0  Suicidal thoughts 0 0 0  PHQ-9 Score 19 7 7   Difficult doing work/chores Very difficult Not difficult at all Not difficult at all   Interpretation of Total Score  Total Score Depression Severity:  1-4 = Minimal depression, 5-9 = Mild depression, 10-14 = Moderate depression, 15-19 = Moderately severe depression, 20-27 = Severe depression   Psychosocial Evaluation and Intervention:  Psychosocial Evaluation - 07/11/24 1054       Psychosocial Evaluation & Interventions   Interventions Encouraged to exercise with the program and follow exercise prescription;Stress management education;Relaxation education    Comments Mr. Sculley is coming to cardiac rehab. 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 He recently got switched to pureed foods which he is not enjoying very much. His doctor has started him on Paxil to help with his mental health symptoms that have increased since his health decline. His daughter was on the phone orientation and states that they are hopeful this will help with his depression symptoms and stress. He reports no sleep concerns at this time. They are hoping he can gain some stamina during the program    Expected Outcomes Short: attend cardiac rehab for education and exercise Long: develop and maintain positive self care habits    Continue Psychosocial Services  Follow up required by  staff          Psychosocial Re-Evaluation:   Psychosocial Discharge (Final Psychosocial Re-Evaluation):   Vocational Rehabilitation: Provide vocational rehab assistance to qualifying candidates.   Vocational Rehab Evaluation & Intervention:  Vocational Rehab - 07/11/24 1043       Initial Vocational Rehab Evaluation & Intervention   Assessment shows need for Vocational Rehabilitation No          Education: Education Goals: Education classes will be provided on a variety of topics geared toward better understanding of heart health and risk factor modification. Participant will state understanding/return demonstration of topics presented as noted by education test scores.  Learning Barriers/Preferences:  Learning Barriers/Preferences - 07/11/24 1149       Learning Barriers/Preferences   Learning Barriers --   nonverbal   Learning Preferences Individual Instruction          General Cardiac Education Topics:  AED/CPR: - Group verbal and written instruction with the use of models to demonstrate the basic use of the AED with the basic ABC's of resuscitation.   Test and Procedures: - Group verbal and visual presentation and models provide information about basic cardiac anatomy and function. Reviews the testing methods done to diagnose heart disease and the outcomes of the test results. Describes the treatment choices: Medical Management, Angioplasty, or Coronary Bypass Surgery for treating various heart conditions including Myocardial  Infarction, Angina, Valve Disease, and Cardiac Arrhythmias. Written material provided at class time.   Medication Safety: - Group verbal and visual instruction to review commonly prescribed medications for heart and lung disease. Reviews the medication, class of the drug, and side effects. Includes the steps to properly store meds and maintain the prescription regimen. Written material provided at class time. Flowsheet Row Pulmonary Rehab  from 01/21/2022 in [facility] Cardiac and Pulmonary Rehab  Date 12/30/21  Educator SB  Instruction Review Code 1- Verbalizes Understanding    Intimacy: - Group verbal instruction through game format to discuss how heart and lung disease can affect sexual intimacy. Written material provided at class time. Flowsheet Row Cardiac Rehab from 07/18/2024 in [facility] Cardiac and Pulmonary Rehab  Education need identified 07/18/24    Know Your Numbers and Heart Failure: - Group verbal and visual instruction to discuss disease risk factors for cardiac and pulmonary disease and treatment options.  Reviews associated critical values for Overweight/Obesity, Hypertension, Cholesterol, and Diabetes.  Discusses basics of heart failure: signs/symptoms and treatments.  Introduces Heart Failure Zone chart for action plan for heart failure. Written material provided at class time. Flowsheet Row Cardiac Rehab from 07/18/2024 in [facility] Cardiac and Pulmonary Rehab  Education need identified 07/18/24    Infection Prevention: - Provides verbal and written material to individual with discussion of infection control including proper hand washing and proper equipment cleaning during exercise session. Flowsheet Row Cardiac Rehab from 07/18/2024 in [facility] Cardiac and Pulmonary Rehab  Date 07/18/24  Educator [facility]  Instruction Review Code 1- Verbalizes Understanding    Falls Prevention: - Provides verbal and written material to individual with discussion of falls prevention and safety. Flowsheet Row Cardiac Rehab from 07/18/2024 in [facility] Cardiac and Pulmonary Rehab  Date 07/18/24  Educator [facility]  Instruction Review Code 1- Verbalizes Understanding    Other: -Provides group and verbal instruction on various topics (see comments)   Knowledge Questionnaire Score:  Knowledge Questionnaire Score - 07/18/24 1202       Knowledge Questionnaire Score   Pre Score 20/26          Core Components/Risk Factors/Patient Goals at Admission:   Personal Goals and Risk Factors at Admission - 07/18/24 1156       Core Components/Risk Factors/Patient Goals on Admission    Weight Management Yes;Weight Gain    Intervention Weight Management: Develop a combined nutrition and exercise program designed to reach desired caloric intake, while maintaining appropriate intake of nutrient and fiber, sodium and fats, and appropriate energy expenditure required for the weight goal.;Weight Management: Provide education and appropriate resources to help participant work on and attain dietary goals.;Weight Management/Obesity: Establish reasonable short term and long term weight goals.    Admit Weight 115 lb 8 oz (52.4 kg)    Goal Weight: Short Term 120 lb (54.4 kg)    Goal Weight: Long Term 130 lb (59 kg)    Expected Outcomes Short Term: Continue to assess and modify interventions until short term weight is achieved;Long Term: Adherence to nutrition and physical activity/exercise program aimed toward attainment of established weight goal;Understanding recommendations for meals to include 15-35% energy as protein, 25-35% energy from fat, 35-60% energy from carbohydrates, less than 200mg  of dietary cholesterol, 20-35 gm of total fiber daily;Weight Gain: Understanding of general recommendations for a high calorie, high protein meal plan that promotes weight gain by distributing calorie intake throughout the day with the consumption for 4-5 meals, snacks, and/or supplements;Understanding of distribution of calorie intake throughout the  day with the consumption of 4-5 meals/snacks    Diabetes Yes    Intervention Provide education about signs/symptoms and action to take for hypo/hyperglycemia.;Provide education about proper nutrition, including hydration, and aerobic/resistive exercise prescription along with prescribed medications to achieve blood glucose in normal ranges: Fasting glucose 65-99 mg/dL    Expected Outcomes Short Term: Participant verbalizes understanding  of the signs/symptoms and immediate care of hyper/hypoglycemia, proper foot care and importance of medication, aerobic/resistive exercise and nutrition plan for blood glucose control.;Long Term: Attainment of HbA1C < 7%.    Heart Failure Yes    Intervention Provide a combined exercise and nutrition program that is supplemented with education, support and counseling about heart failure. Directed toward relieving symptoms such as shortness of breath, decreased exercise tolerance, and extremity edema.    Expected Outcomes Improve functional capacity of life;Short term: Attendance in program 2-3 days a week with increased exercise capacity. Reported lower sodium intake. Reported increased fruit and vegetable intake. Reports medication compliance.;Short term: Daily weights obtained and reported for increase. Utilizing diuretic protocols set by physician.;Long term: Adoption of self-care skills and reduction of barriers for early signs and symptoms recognition and intervention leading to self-care maintenance.    Hypertension Yes    Intervention Provide education on lifestyle modifcations including regular physical activity/exercise, weight management, moderate sodium restriction and increased consumption of fresh fruit, vegetables, and low fat dairy, alcohol moderation, and smoking cessation.;Monitor prescription use compliance.    Expected Outcomes Short Term: Continued assessment and intervention until BP is < 140/32mm HG in hypertensive participants. < 130/23mm HG in hypertensive participants with diabetes, heart failure or chronic kidney disease.;Long Term: Maintenance of blood pressure at goal levels.    Lipids Yes    Intervention Provide education and support for participant on nutrition & aerobic/resistive exercise along with prescribed medications to achieve LDL 70mg , HDL >40mg .    Expected Outcomes Short Term: Participant states understanding of desired cholesterol values and is compliant with  medications prescribed. Participant is following exercise prescription and nutrition guidelines.;Long Term: Cholesterol controlled with medications as prescribed, with individualized exercise RX and with personalized nutrition plan. Value goals: LDL < 70mg , HDL > 40 mg.          Education:Diabetes - Individual verbal and written instruction to review signs/symptoms of diabetes, desired ranges of glucose level fasting, after meals and with exercise. Acknowledge that pre and post exercise glucose checks will be done for 3 sessions at entry of program. Flowsheet Row Cardiac Rehab from 07/18/2024 in [facility] Cardiac and Pulmonary Rehab  Date 07/18/24  Educator [facility]  Instruction Review Code 1- Verbalizes Understanding    Core Components/Risk Factors/Patient Goals Review:    Core Components/Risk Factors/Patient Goals at Discharge (Final Review):    ITP Comments:  ITP Comments     Row Name 07/11/24 1106 07/18/24 1136 07/23/24 0935 08/08/24 0920     ITP Comments Initial phone call completed. Diagnosis can be found in [facility]. EP Orientation scheduled for Wednesday 8/6 at 10am. Completed and gym orientation for cardiac rehab. Initial ITP created and sent for review to Dr. Oneil Pinal, Medical Director. First full day of exercise!  Patient was oriented to gym and equipment including functions, settings, policies, and procedures.  Patient's individual exercise prescription and treatment plan were reviewed.  All starting workloads were established based on the results of the 6 minute walk test done at initial orientation visit.  The plan for exercise progression was also introduced and progression will be  customized based on patient's performance and goals. 30 Day review completed. Medical Director ITP review done; changes made as directed and signed approval by Medical Director. New to program.       Comments: 30 day review

## 2024-08-08 NOTE — Progress Notes (Signed)
 Daily Session Note  Patient Details  Name: Todd Armstrong MRN: [id number] Date of Birth: 1931-02-27 Referring Provider:   Flowsheet Row Cardiac Rehab from 07/18/2024 in [facility] Cardiac and Pulmonary Rehab  Referring Provider Dr. Shanna Sauce    Encounter Date: 08/08/2024  Check In:  Session Check In - 08/08/24 0917       Check-In   Supervising physician immediately available to respond to emergencies See telemetry face sheet for immediately available ER MD    Location [facility]-Cardiac & Pulmonary Rehab    Staff Present Leita Franks RN,BSN;Maxon Burnell BS, Exercise Physiologist;Margaret Best, MS, Exercise Physiologist;Laureen Delores, BS, RRT, CPFT    Virtual Visit No    Medication changes reported     No    Fall or balance concerns reported    No    Tobacco Cessation No Change    Warm-up and Cool-down Performed on first and last piece of equipment    Resistance Training Performed Yes    VAD Patient? No    PAD/SET Patient? No      Pain Assessment   Currently in Pain? No/denies             Social History   Tobacco Use  Smoking Status Former   Current packs/day: 0.00   Average packs/day: 1 pack/day for 8.0 years (8.0 ttl pk-yrs)   Types: Cigarettes   Start date: 09/12/1960   Quit date: 09/12/1968   Years since quitting: 55.9  Smokeless Tobacco Never  Tobacco Comments   quit smoking cigarettes > 40 years ago    Goals Met:  Independence with exercise equipment Exercise tolerated well No report of concerns or symptoms today Strength training completed today  Goals Unmet:  Not Applicable  Comments: Pt able to follow exercise prescription today without complaint.  Will continue to monitor for progression.    Dr. Oneil Pinal is Medical Director for [facility] Cardiac Rehabilitation.  Dr. Fuad Aleskerov is Medical Director for [facility] Pulmonary Rehabilitation.

## 2024-08-10 ENCOUNTER — Encounter

## 2024-08-15 ENCOUNTER — Encounter

## 2024-08-17 ENCOUNTER — Encounter

## 2024-08-20 ENCOUNTER — Encounter

## 2024-08-22 ENCOUNTER — Encounter

## 2024-08-24 ENCOUNTER — Encounter

## 2024-08-27 ENCOUNTER — Encounter

## 2024-08-29 ENCOUNTER — Encounter

## 2024-08-31 ENCOUNTER — Encounter

## 2024-09-03 ENCOUNTER — Encounter

## 2024-09-05 ENCOUNTER — Encounter

## 2024-09-05 DIAGNOSIS — Z9861 Coronary angioplasty status: Secondary | ICD-10-CM

## 2024-09-05 DIAGNOSIS — Z955 Presence of coronary angioplasty implant and graft: Secondary | ICD-10-CM

## 2024-09-05 NOTE — Progress Notes (Signed)
 Cardiac Individual Treatment Plan  Patient Details  Name: Todd Armstrong MRN: [id number] Date of Birth: 02/01/1931 Referring Provider:   Flowsheet Row Cardiac Rehab from 07/18/2024 in [facility] Cardiac and Pulmonary Rehab  Referring Provider Dr. Shanna Sauce    Initial Encounter Date:  Flowsheet Row Cardiac Rehab from 07/18/2024 in [facility] Cardiac and Pulmonary Rehab  Date 07/18/24    Visit Diagnosis: Status post coronary artery stent placement  History of percutaneous coronary intervention  Patient's Home Medications on Admission:  Current Outpatient Medications:    Accu-Chek FastClix Lancets MISC, For use when checking blood sugars, Disp: , Rfl:    albuterol  (VENTOLIN  HFA) 108 (90 Base) MCG/ACT inhaler, Inhale 2 puffs into the lungs every 6 (six) hours as needed for wheezing or shortness of breath., Disp: 8 g, Rfl: 6   apixaban  (ELIQUIS ) 2.5 MG TABS tablet, Take 2.5 mg by mouth 2 (two) times daily. Per patient's daughter Dr. Charlott switched Apixaban  (Eliquis ) from 5 mg twice a day. Patient is now taking Apixaban  (Eliquis )  2.5 mg twice a day., Disp: , Rfl:    clopidogrel  (PLAVIX ) 75 MG tablet, TAKE 1 TABLET BY MOUTH DAILY, Disp: 100 tablet, Rfl: 3   Dextromethorphan  HBr 15 MG/5ML LIQD, Take 5 mLs (15 mg total) by mouth 3 (three) times daily as needed., Disp: 118 mL, Rfl: 0   metFORMIN  (GLUCOPHAGE ) 1000 MG tablet, Take 1,000 mg by mouth 2 (two) times daily with a meal. (Patient not taking: Reported on 07/11/2024), Disp: , Rfl:    metoprolol  succinate (TOPROL -XL) 25 MG 24 hr tablet, Take 25 mg by mouth daily., Disp: , Rfl:    metoprolol  tartrate (LOPRESSOR ) 25 MG tablet, Take 0.5 tablets (12.5 mg total) by mouth 2 (two) times daily. (Patient not taking: Reported on 07/11/2024), Disp: 30 tablet, Rfl: 0   nitroGLYCERIN  (NITROSTAT ) 0.4 MG SL tablet, Place 1 tablet (0.4 mg total) under the tongue every 5 (five) minutes as needed for chest pain., Disp: 25 tablet, Rfl: 6   PARoxetine (PAXIL) 20 MG  tablet, Take by mouth., Disp: , Rfl:    potassium chloride  (KLOR-CON ) 10 MEQ tablet, Take 1 tablet (10 mEq total) by mouth 2 (two) times daily., Disp: 60 tablet, Rfl: 0   rosuvastatin  (CRESTOR ) 10 MG tablet, TAKE 1 TABLET BY MOUTH DAILY, Disp: 100 tablet, Rfl: 1   tamsulosin  (FLOMAX ) 0.4 MG CAPS capsule, Take 0.4 mg by mouth at bedtime. , Disp: , Rfl:    torsemide  (DEMADEX ) 20 MG tablet, Take 1/2 tablet by mouth only on Mondays, Wednesdays, and Fridays, Disp: 12 tablet, Rfl: 6   triamcinolone cream (KENALOG) 0.1 %, Apply 1 application topically as needed (for itching)., Disp: , Rfl:   Past Medical History: Past Medical History:  Diagnosis Date   Acute myocardial infarction    Anemia    Aortic insufficiency    Carotid stenosis    Chronic combined systolic and diastolic CHF (congestive heart failure) (HCC)    Coronary artery disease    PTCA and stenting of his right coronary artery. 3.5 x 16-mm Liberte stent.               Diabetes mellitus without complication (HCC)    Dyslipidemia    Emphysema (subcutaneous) (surgical) resulting from a procedure    Hyperlipemia    Hypertension    Mitral regurgitation    Partial tear of rotator cuff    right shoulder   Peripheral vascular disease    Persistent atrial fibrillation [facility])    Pulmonary  hypertension (HCC)    Stroke [facility])    Wears dentures     Tobacco Use: Social History   Tobacco Use  Smoking Status Former   Current packs/day: 0.00   Average packs/day: 1 pack/day for 8.0 years (8.0 ttl pk-yrs)   Types: Cigarettes   Start date: 09/12/1960   Quit date: 09/12/1968   Years since quitting: 56.0  Smokeless Tobacco Never  Tobacco Comments   quit smoking cigarettes > 40 years ago    Labs: Review Flowsheet  More data exists      Latest Ref Rng & Units 02/19/2019 04/22/2020 06/03/2021 09/28/2021 09/09/2022  Labs for ITP Cardiac and Pulmonary Rehab  Cholestrol 100 - 199 mg/dL 867  851  868  - 862   LDL (calc) 0 - 99 mg/dL 37  52  38  -  33   HDL-C >39 mg/dL 76  75  79  - 88   Trlycerides 0 - 149 mg/dL 96  880  72  - 83   Hemoglobin A1c 4.8 - 5.6 % - - - 6.3  -     Exercise Target Goals: Exercise Program Goal: Individual exercise prescription set using results from initial 6 min walk test and THRR while considering  patient's activity barriers and safety.   Exercise Prescription Goal: Initial exercise prescription builds to 30-45 minutes a day of aerobic activity, 2-3 days per week.  Home exercise guidelines will be given to patient during program as part of exercise prescription that the participant will acknowledge.   Education: Aerobic Exercise: - Group verbal and visual presentation on the components of exercise prescription. Introduces F.I.T.T principle from ACSM for exercise prescriptions.  Reviews F.I.T.T. principles of aerobic exercise including progression. Written material provided at class time. Flowsheet Row Cardiac Rehab from 07/18/2024 in [facility] Cardiac and Pulmonary Rehab  Education need identified 07/18/24    Education: Resistance Exercise: - Group verbal and visual presentation on the components of exercise prescription. Introduces F.I.T.T principle from ACSM for exercise prescriptions  Reviews F.I.T.T. principles of resistance exercise including progression. Written material provided at class time.    Education: Exercise & Equipment Safety: - Individual verbal instruction and demonstration of equipment use and safety with use of the equipment. Flowsheet Row Cardiac Rehab from 07/18/2024 in [facility] Cardiac and Pulmonary Rehab  Date 07/18/24  Educator [facility]  Instruction Review Code 1- Verbalizes Understanding    Education: Exercise Physiology & General Exercise Guidelines: - Group verbal and written instruction with models to review the exercise physiology of the cardiovascular system and associated critical values. Provides general exercise guidelines with specific guidelines to those with heart or lung  disease. Written material provided at class time. Flowsheet Row Cardiac Rehab from 07/18/2024 in [facility] Cardiac and Pulmonary Rehab  Education need identified 07/18/24    Education: Flexibility, Balance, Mind/Body Relaxation: - Group verbal and visual presentation with interactive activity on the components of exercise prescription. Introduces F.I.T.T principle from ACSM for exercise prescriptions. Reviews F.I.T.T. principles of flexibility and balance exercise training including progression. Also discusses the mind body connection.  Reviews various relaxation techniques to help reduce and manage stress (i.e. Deep breathing, progressive muscle relaxation, and visualization). Balance handout provided to take home. Written material provided at class time. Flowsheet Row Pulmonary Rehab from 01/21/2022 in [facility] Cardiac and Pulmonary Rehab  Date 12/23/21  Educator AS  Instruction Review Code 1- Verbalizes Understanding    Activity Barriers & Risk Stratification:  Activity Barriers & Cardiac Risk Stratification - 07/18/24 1145  Activity Barriers & Cardiac Risk Stratification   Activity Barriers Back Problems;Neck/Spine Problems;Balance Concerns;History of Falls    Cardiac Risk Stratification High          6 Minute Walk:  6 Minute Walk     Row Name 07/18/24 1143         6 Minute Walk   Phase Initial     Distance 700 feet     Walk Time 6 minutes     # of Rest Breaks 0     MPH 1.33     METS 1.35     RPE 11     Perceived Dyspnea  0     VO2 Peak 4.73     Symptoms No     Resting HR 79 bpm     Resting BP 124/80     Resting Oxygen Saturation  98 %     Exercise Oxygen Saturation  during 6 min walk 92 %     Max Ex. HR 105 bpm     Max Ex. BP 150/80     2 Minute Post BP 124/82        Oxygen Initial Assessment:   Oxygen Re-Evaluation:   Oxygen Discharge (Final Oxygen Re-Evaluation):   Initial Exercise Prescription:  Initial Exercise Prescription - 07/18/24 1100       Date  of Initial Exercise RX and Referring Provider   Date 07/18/24    Referring Provider Dr. Shanna Sauce      Oxygen   Maintain Oxygen Saturation 88% or higher      Recumbant Bike   Level 1    RPM 50    Watts 15    Minutes 15    METs 1.35      NuStep   Level 1    SPM 80    Minutes 15    METs 1.35      Biostep-RELP   Level 1    SPM 50    Minutes 15    METs 1.35      Track   Laps 10    Minutes 15    METs 1.54      Prescription Details   Frequency (times per week) 3    Duration Progress to 30 minutes of continuous aerobic without signs/symptoms of physical distress      Intensity   THRR 40-80% of Max Heartrate 99-119    Ratings of Perceived Exertion 11-13    Perceived Dyspnea 0-4      Progression   Progression Continue to progress workloads to maintain intensity without signs/symptoms of physical distress.      Resistance Training   Training Prescription Yes    Weight 2    Reps 10-15          Perform Capillary Blood Glucose checks as needed.  Exercise Prescription Changes:   Exercise Prescription Changes     Row Name 07/18/24 1100 08/08/24 1400 08/23/24 1800         Response to Exercise   Blood Pressure (Admit) 124/80 116/64 130/64     Blood Pressure (Exercise) 150/80 132/54 146/60     Blood Pressure (Exit) 124/82 104/64 110/60     Heart Rate (Admit) 79 bpm 92 bpm 6 bpm     Heart Rate (Exercise) 105 bpm 123 bpm 125 bpm     Heart Rate (Exit) 88 bpm 95 bpm 88 bpm     Oxygen Saturation (Admit) 98 % -- --     Oxygen Saturation (Exercise) 92 % -- --  Oxygen Saturation (Exit) 100 % -- --     Rating of Perceived Exertion (Exercise) 11 13 13      Perceived Dyspnea (Exercise) 0 0 0     Symptoms none none none     Comments 6 MWT first 2 weeks of exercise --     Duration -- Progress to 30 minutes of  aerobic without signs/symptoms of physical distress Progress to 30 minutes of  aerobic without signs/symptoms of physical distress     Intensity -- THRR  unchanged THRR unchanged       Progression   Progression -- Continue to progress workloads to maintain intensity without signs/symptoms of physical distress. Continue to progress workloads to maintain intensity without signs/symptoms of physical distress.     Average METs -- 2.06 2.26       Resistance Training   Training Prescription -- Yes Yes     Weight -- 2 2     Reps -- 10-15 10-15       Interval Training   Interval Training -- No No       Recumbant Bike   Level -- 1 --     Watts -- 12 --     Minutes -- 15 --     METs -- 2.87 --       NuStep   Level -- 3 3     Minutes -- 15 15     METs -- 2.5 2.14       Biostep-RELP   Level -- 1 3     Minutes -- 15 15     METs -- 2 2       Track   Laps -- 17 23     Minutes -- 15 15     METs -- 1.92 2.25       Oxygen   Maintain Oxygen Saturation -- 88% or higher 88% or higher        Exercise Comments:   Exercise Comments     Row Name 07/23/24 0935           Exercise Comments First full day of exercise!  Patient was oriented to gym and equipment including functions, settings, policies, and procedures.  Patient's individual exercise prescription and treatment plan were reviewed.  All starting workloads were established based on the results of the 6 minute walk test done at initial orientation visit.  The plan for exercise progression was also introduced and progression will be customized based on patient's performance and goals.          Exercise Goals and Review:   Exercise Goals     Row Name 07/18/24 1155             Exercise Goals   Increase Physical Activity Yes       Intervention Provide advice, education, support and counseling about physical activity/exercise needs.;Develop an individualized exercise prescription for aerobic and resistive training based on initial evaluation findings, risk stratification, comorbidities and participant's personal goals.       Expected Outcomes Short Term: Attend rehab on a  regular basis to increase amount of physical activity.;Long Term: Add in home exercise to make exercise part of routine and to increase amount of physical activity.;Long Term: Exercising regularly at least 3-5 days a week.       Increase Strength and Stamina Yes       Intervention Provide advice, education, support and counseling about physical activity/exercise needs.;Develop an individualized exercise prescription for aerobic and resistive training based on  initial evaluation findings, risk stratification, comorbidities and participant's personal goals.       Expected Outcomes Short Term: Increase workloads from initial exercise prescription for resistance, speed, and METs.;Short Term: Perform resistance training exercises routinely during rehab and add in resistance training at home;Long Term: Improve cardiorespiratory fitness, muscular endurance and strength as measured by increased METs and functional capacity ( )       Able to understand and use rate of perceived exertion (RPE) scale Yes       Intervention Provide education and explanation on how to use RPE scale       Expected Outcomes Short Term: Able to use RPE daily in rehab to express subjective intensity level;Long Term:  Able to use RPE to guide intensity level when exercising independently       Able to understand and use Dyspnea scale Yes       Intervention Provide education and explanation on how to use Dyspnea scale       Expected Outcomes Short Term: Able to use Dyspnea scale daily in rehab to express subjective sense of shortness of breath during exertion;Long Term: Able to use Dyspnea scale to guide intensity level when exercising independently       Knowledge and understanding of Target Heart Rate Range (THRR) Yes       Intervention Provide education and explanation of THRR including how the numbers were predicted and where they are located for reference       Expected Outcomes Short Term: Able to state/look up THRR;Long Term:  Able to use THRR to govern intensity when exercising independently;Short Term: Able to use daily as guideline for intensity in rehab       Able to check pulse independently Yes       Intervention Provide education and demonstration on how to check pulse in carotid and radial arteries.;Review the importance of being able to check your own pulse for safety during independent exercise       Expected Outcomes Short Term: Able to explain why pulse checking is important during independent exercise;Long Term: Able to check pulse independently and accurately       Understanding of Exercise Prescription Yes       Intervention Provide education, explanation, and written materials on patient's individual exercise prescription       Expected Outcomes Short Term: Able to explain program exercise prescription;Long Term: Able to explain home exercise prescription to exercise independently          Exercise Goals Re-Evaluation :  Exercise Goals Re-Evaluation     Row Name 07/23/24 0936 08/08/24 1420 08/23/24 1829 09/04/24 1505       Exercise Goal Re-Evaluation   Exercise Goals Review Increase Physical Activity;Able to understand and use rate of perceived exertion (RPE) scale;Knowledge and understanding of Target Heart Rate Range (THRR);Understanding of Exercise Prescription;Increase Strength and Stamina;Able to understand and use Dyspnea scale;Able to check pulse independently Increase Physical Activity;Increase Strength and Stamina;Understanding of Exercise Prescription Increase Physical Activity;Increase Strength and Stamina;Understanding of Exercise Prescription Increase Physical Activity;Increase Strength and Stamina;Understanding of Exercise Prescription    Comments Reviewed RPE and dyspnea scale, THR and program prescription with pt today.  Pt voiced understanding and was given a copy of goals to take home. Kate is off to a great start in the program, and was able to attend his first few sessions during  this review period. During his first sessions he was able to walk 17 laps on the track, and use the T4 nustep at  level 3. We will continue to monitor his progress in the program. Nyshawn is off to a great start in the program. He was able to increase to 23 laps on the track. He also increased to level 3 on the biostep. He maintained level 3 on the T4 nustep. We will continue to monitor his progress in the program. Jamison has not attended rehab since the last review. He is on vacation for the month of september, and plans to return to rehab when he gets back. We will continue to monitor his progress in the program when he returns.    Expected Outcomes Short: Use RPE daily to regulate intensity. Long: Follow program prescription in THR. Short: Continue to follow exercise prescription. Long: Continue exercise to improve strength and stamina. Short: Continue to push for more laps on the track. Long: Continue exercise to improve strength and stamina. Short: Return to rehab after vacation. Long: Graduate.       Discharge Exercise Prescription (Final Exercise Prescription Changes):  Exercise Prescription Changes - 08/23/24 1800       Response to Exercise   Blood Pressure (Admit) 130/64    Blood Pressure (Exercise) 146/60    Blood Pressure (Exit) 110/60    Heart Rate (Admit) 6 bpm    Heart Rate (Exercise) 125 bpm    Heart Rate (Exit) 88 bpm    Rating of Perceived Exertion (Exercise) 13    Perceived Dyspnea (Exercise) 0    Symptoms none    Duration Progress to 30 minutes of  aerobic without signs/symptoms of physical distress    Intensity THRR unchanged      Progression   Progression Continue to progress workloads to maintain intensity without signs/symptoms of physical distress.    Average METs 2.26      Resistance Training   Training Prescription Yes    Weight 2    Reps 10-15      Interval Training   Interval Training No      NuStep   Level 3    Minutes 15    METs 2.14       Biostep-RELP   Level 3    Minutes 15    METs 2      Track   Laps 23    Minutes 15    METs 2.25      Oxygen   Maintain Oxygen Saturation 88% or higher          Nutrition:  Target Goals: Understanding of nutrition guidelines, daily intake of sodium 1500mg , cholesterol 200mg , calories 30% from fat and 7% or less from saturated fats, daily to have 5 or more servings of fruits and vegetables.  Education: Nutrition 1 -Group instruction provided by verbal, written material, interactive activities, discussions, models, and posters to present general guidelines for heart healthy nutrition including macronutrients, label reading, and promoting whole foods over processed counterparts. Education serves as Pensions consultant of discussion of heart healthy eating for all. Written material provided at class time.    Education: Nutrition 2 -Group instruction provided by verbal, written material, interactive activities, discussions, models, and posters to present general guidelines for heart healthy nutrition including sodium, cholesterol, and saturated fat. Providing guidance of habit forming to improve blood pressure, cholesterol, and body weight. Written material provided at class time.     Biometrics:  Pre Biometrics - 07/18/24 1156       Pre Biometrics   Height 5' 5.5 (1.664 m)    Weight 115 lb 8 oz (52.4 kg)  Waist Circumference 33 inches    Hip Circumference 36 inches    Waist to Hip Ratio 0.92 %    BMI (Calculated) 18.92    Single Leg Stand 5.07 seconds           Nutrition Therapy Plan and Nutrition Goals:   Nutrition Assessments:  MEDIFICTS Score Key: >=70 Need to make dietary changes  40-70 Heart Healthy Diet <= 40 Therapeutic Level Cholesterol Diet  Flowsheet Row Pulmonary Rehab from 01/27/2022 in [facility] Cardiac and Pulmonary Rehab  Picture Your Plate Total Score on Admission 63  Picture Your Plate Total Score on Discharge 56   Picture Your Plate Scores: <59  Unhealthy dietary pattern with much room for improvement. 41-50 Dietary pattern unlikely to meet recommendations for good health and room for improvement. 51-60 More healthful dietary pattern, with some room for improvement.  >60 Healthy dietary pattern, although there may be some specific behaviors that could be improved.    Nutrition Goals Re-Evaluation:   Nutrition Goals Discharge (Final Nutrition Goals Re-Evaluation):   Psychosocial: Target Goals: Acknowledge presence or absence of significant depression and/or stress, maximize coping skills, provide positive support system. Participant is able to verbalize types and ability to use techniques and skills needed for reducing stress and depression.   Education: Stress, Anxiety, and Depression - Group verbal and visual presentation to define topics covered.  Reviews how body is impacted by stress, anxiety, and depression.  Also discusses healthy ways to reduce stress and to treat/manage anxiety and depression. Written material provided at class time.   Education: Sleep Hygiene -Provides group verbal and written instruction about how sleep can affect your health.  Define sleep hygiene, discuss sleep cycles and impact of sleep habits. Review good sleep hygiene tips.   Initial Review & Psychosocial Screening:  Initial Psych Review & Screening - 07/11/24 1043       Initial Review   Current issues with Current Stress Concerns;Current Psychotropic Meds    Source of Stress Concerns Chronic Illness      Family Dynamics   Good Support System? Yes      Barriers   Psychosocial barriers to participate in program There are no identifiable barriers or psychosocial needs.;The patient should benefit from training in stress management and relaxation.      Screening Interventions   Interventions To provide support and resources with identified psychosocial needs;Encouraged to exercise;Provide feedback about the scores to participant    Expected  Outcomes Short Term goal: Utilizing psychosocial counselor, staff and physician to assist with identification of specific Stressors or current issues interfering with healing process. Setting desired goal for each stressor or current issue identified.;Long Term Goal: Stressors or current issues are controlled or eliminated.;Short Term goal: Identification and review with participant of any Quality of Life or Depression concerns found by scoring the questionnaire.;Long Term goal: The participant improves quality of Life and PHQ9 Scores as seen by post scores and/or verbalization of changes          Quality of Life Scores:   Quality of Life - 07/18/24 1201       Quality of Life   Select Quality of Life      Quality of Life Scores   Health/Function Pre 8.25 %    Socioeconomic Pre 30 %    Psych/Spiritual Pre 22.5 %    Family Pre 30 %    GLOBAL Pre 16.4 %         Scores of 19 and below usually indicate a poorer  quality of life in these areas.  A difference of  2-3 points is a clinically meaningful difference.  A difference of 2-3 points in the total score of the Quality of Life Index has been associated with significant improvement in overall quality of life, self-image, physical symptoms, and general health in studies assessing change in quality of life.  PHQ-9: Review Flowsheet       07/18/2024 01/27/2022 10/27/2021  Depression screen PHQ 2/9  Decreased Interest 1 0 0  Down, Depressed, Hopeless 2 0 0  PHQ - 2 Score 3 0 0  Altered sleeping 3 2 3   Tired, decreased energy 2 1 1   Change in appetite 3 2 3   Feeling bad or failure about yourself  2 0 0  Trouble concentrating 3 0 0  Moving slowly or fidgety/restless 3 2 0  Suicidal thoughts 0 0 0  PHQ-9 Score 19 7 7   Difficult doing work/chores Very difficult Not difficult at all Not difficult at all   Interpretation of Total Score  Total Score Depression Severity:  1-4 = Minimal depression, 5-9 = Mild depression, 10-14 = Moderate  depression, 15-19 = Moderately severe depression, 20-27 = Severe depression   Psychosocial Evaluation and Intervention:  Psychosocial Evaluation - 07/11/24 1054       Psychosocial Evaluation & Interventions   Interventions Encouraged to exercise with the program and follow exercise prescription;Stress management education;Relaxation education    Comments Todd Armstrong is coming to cardiac rehab. 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 He recently got switched to pureed foods which he is not enjoying very much. His doctor has started him on Paxil to help with his mental health symptoms that have increased since his health decline. His daughter was on the phone orientation and states that they are hopeful this will help with his depression symptoms and stress. He reports no sleep concerns at this time. They are hoping he can gain some stamina during the program    Expected Outcomes Short: attend cardiac rehab for education and exercise Long: develop and maintain positive self care habits    Continue Psychosocial Services  Follow up required by staff          Psychosocial Re-Evaluation:   Psychosocial Discharge (Final Psychosocial Re-Evaluation):   Vocational Rehabilitation: Provide vocational rehab assistance to qualifying candidates.   Vocational Rehab Evaluation & Intervention:  Vocational Rehab - 07/11/24 1043       Initial Vocational Rehab Evaluation & Intervention   Assessment shows need for Vocational Rehabilitation No          Education: Education Goals: Education classes will be provided on a variety of topics geared toward better understanding of heart health and risk factor modification. Participant will state understanding/return demonstration of topics presented as noted by education test scores.  Learning Barriers/Preferences:  Learning Barriers/Preferences - 07/11/24 1149       Learning Barriers/Preferences   Learning Barriers --    nonverbal   Learning Preferences Individual Instruction          General Cardiac Education Topics:  AED/CPR: - Group verbal and written instruction with the use of models to demonstrate the basic use of the AED with the basic ABC's of resuscitation.   Test and Procedures: - Group verbal and visual presentation and models provide information about basic cardiac anatomy and function. Reviews the testing methods done to diagnose heart disease and the outcomes of the test results. Describes the treatment choices: Medical Management, Angioplasty, or Coronary Bypass  Surgery for treating various heart conditions including Myocardial Infarction, Angina, Valve Disease, and Cardiac Arrhythmias. Written material provided at class time.   Medication Safety: - Group verbal and visual instruction to review commonly prescribed medications for heart and lung disease. Reviews the medication, class of the drug, and side effects. Includes the steps to properly store meds and maintain the prescription regimen. Written material provided at class time. Flowsheet Row Pulmonary Rehab from 01/21/2022 in [facility] Cardiac and Pulmonary Rehab  Date 12/30/21  Educator SB  Instruction Review Code 1- Verbalizes Understanding    Intimacy: - Group verbal instruction through game format to discuss how heart and lung disease can affect sexual intimacy. Written material provided at class time. Flowsheet Row Cardiac Rehab from 07/18/2024 in [facility] Cardiac and Pulmonary Rehab  Education need identified 07/18/24    Know Your Numbers and Heart Failure: - Group verbal and visual instruction to discuss disease risk factors for cardiac and pulmonary disease and treatment options.  Reviews associated critical values for Overweight/Obesity, Hypertension, Cholesterol, and Diabetes.  Discusses basics of heart failure: signs/symptoms and treatments.  Introduces Heart Failure Zone chart for action plan for heart failure. Written material  provided at class time. Flowsheet Row Cardiac Rehab from 07/18/2024 in [facility] Cardiac and Pulmonary Rehab  Education need identified 07/18/24    Infection Prevention: - Provides verbal and written material to individual with discussion of infection control including proper hand washing and proper equipment cleaning during exercise session. Flowsheet Row Cardiac Rehab from 07/18/2024 in [facility] Cardiac and Pulmonary Rehab  Date 07/18/24  Educator [facility]  Instruction Review Code 1- Verbalizes Understanding    Falls Prevention: - Provides verbal and written material to individual with discussion of falls prevention and safety. Flowsheet Row Cardiac Rehab from 07/18/2024 in [facility] Cardiac and Pulmonary Rehab  Date 07/18/24  Educator [facility]  Instruction Review Code 1- Verbalizes Understanding    Other: -Provides group and verbal instruction on various topics (see comments)   Knowledge Questionnaire Score:  Knowledge Questionnaire Score - 07/18/24 1202       Knowledge Questionnaire Score   Pre Score 20/26          Core Components/Risk Factors/Patient Goals at Admission:  Personal Goals and Risk Factors at Admission - 07/18/24 1156       Core Components/Risk Factors/Patient Goals on Admission    Weight Management Yes;Weight Gain    Intervention Weight Management: Develop a combined nutrition and exercise program designed to reach desired caloric intake, while maintaining appropriate intake of nutrient and fiber, sodium and fats, and appropriate energy expenditure required for the weight goal.;Weight Management: Provide education and appropriate resources to help participant work on and attain dietary goals.;Weight Management/Obesity: Establish reasonable short term and long term weight goals.    Admit Weight 115 lb 8 oz (52.4 kg)    Goal Weight: Short Term 120 lb (54.4 kg)    Goal Weight: Long Term 130 lb (59 kg)    Expected Outcomes Short Term: Continue to assess and modify interventions until  short term weight is achieved;Long Term: Adherence to nutrition and physical activity/exercise program aimed toward attainment of established weight goal;Understanding recommendations for meals to include 15-35% energy as protein, 25-35% energy from fat, 35-60% energy from carbohydrates, less than 200mg  of dietary cholesterol, 20-35 gm of total fiber daily;Weight Gain: Understanding of general recommendations for a high calorie, high protein meal plan that promotes weight gain by distributing calorie intake throughout the day with the consumption for 4-5 meals, snacks, and/or  supplements;Understanding of distribution of calorie intake throughout the day with the consumption of 4-5 meals/snacks    Diabetes Yes    Intervention Provide education about signs/symptoms and action to take for hypo/hyperglycemia.;Provide education about proper nutrition, including hydration, and aerobic/resistive exercise prescription along with prescribed medications to achieve blood glucose in normal ranges: Fasting glucose 65-99 mg/dL    Expected Outcomes Short Term: Participant verbalizes understanding of the signs/symptoms and immediate care of hyper/hypoglycemia, proper foot care and importance of medication, aerobic/resistive exercise and nutrition plan for blood glucose control.;Long Term: Attainment of HbA1C < 7%.    Heart Failure Yes    Intervention Provide a combined exercise and nutrition program that is supplemented with education, support and counseling about heart failure. Directed toward relieving symptoms such as shortness of breath, decreased exercise tolerance, and extremity edema.    Expected Outcomes Improve functional capacity of life;Short term: Attendance in program 2-3 days a week with increased exercise capacity. Reported lower sodium intake. Reported increased fruit and vegetable intake. Reports medication compliance.;Short term: Daily weights obtained and reported for increase. Utilizing diuretic protocols  set by physician.;Long term: Adoption of self-care skills and reduction of barriers for early signs and symptoms recognition and intervention leading to self-care maintenance.    Hypertension Yes    Intervention Provide education on lifestyle modifcations including regular physical activity/exercise, weight management, moderate sodium restriction and increased consumption of fresh fruit, vegetables, and low fat dairy, alcohol moderation, and smoking cessation.;Monitor prescription use compliance.    Expected Outcomes Short Term: Continued assessment and intervention until BP is < 140/65mm HG in hypertensive participants. < 130/50mm HG in hypertensive participants with diabetes, heart failure or chronic kidney disease.;Long Term: Maintenance of blood pressure at goal levels.    Lipids Yes    Intervention Provide education and support for participant on nutrition & aerobic/resistive exercise along with prescribed medications to achieve LDL 70mg , HDL >40mg .    Expected Outcomes Short Term: Participant states understanding of desired cholesterol values and is compliant with medications prescribed. Participant is following exercise prescription and nutrition guidelines.;Long Term: Cholesterol controlled with medications as prescribed, with individualized exercise RX and with personalized nutrition plan. Value goals: LDL < 70mg , HDL > 40 mg.          Education:Diabetes - Individual verbal and written instruction to review signs/symptoms of diabetes, desired ranges of glucose level fasting, after meals and with exercise. Acknowledge that pre and post exercise glucose checks will be done for 3 sessions at entry of program. Flowsheet Row Cardiac Rehab from 07/18/2024 in [facility] Cardiac and Pulmonary Rehab  Date 07/18/24  Educator [facility]  Instruction Review Code 1- Verbalizes Understanding    Core Components/Risk Factors/Patient Goals Review:    Core Components/Risk Factors/Patient Goals at Discharge (Final  Review):    ITP Comments:  ITP Comments     Row Name 07/11/24 1106 07/18/24 1136 07/23/24 0935 08/08/24 0920 09/05/24 1355   ITP Comments Initial phone call completed. Diagnosis can be found in [facility]. EP Orientation scheduled for Wednesday 8/6 at 10am. Completed and gym orientation for cardiac rehab. Initial ITP created and sent for review to Dr. Oneil Pinal, Medical Director. First full day of exercise!  Patient was oriented to gym and equipment including functions, settings, policies, and procedures.  Patient's individual exercise prescription and treatment plan were reviewed.  All starting workloads were established based on the results of the 6 minute walk test done at initial orientation visit.  The plan for exercise  progression was also introduced and progression will be customized based on patient's performance and goals. 30 Day review completed. Medical Director ITP review done; changes made as directed and signed approval by Medical Director. New to program. 30 Day review completed. Medical Director ITP review done; changes made as directed and signed approval by Medical Director.      Comments: 30 day review

## 2024-09-07 ENCOUNTER — Encounter

## 2024-09-10 ENCOUNTER — Encounter

## 2024-09-11 ENCOUNTER — Encounter

## 2024-09-11 DIAGNOSIS — Z955 Presence of coronary angioplasty implant and graft: Secondary | ICD-10-CM | POA: Diagnosis present

## 2024-09-11 NOTE — Progress Notes (Signed)
 Daily Session Note  Patient Details  Name: KAEDYN POLIVKA MRN: [id number] Date of Birth: 09/04/1931 Referring Provider:   Flowsheet Row Cardiac Rehab from 07/18/2024 in [facility] Cardiac and Pulmonary Rehab  Referring Provider Dr. Shanna Sauce    Encounter Date: 09/11/2024  Check In:  Session Check In - 09/11/24 0909       Check-In   Supervising physician immediately available to respond to emergencies See telemetry face sheet for immediately available ER MD    Location [facility]-Cardiac & Pulmonary Rehab    Staff Present Burnard Davenport RN,BSN,MPA;Maxon Conetta BS, Exercise Physiologist;Margaret Best, MS, Exercise Physiologist;Jason Elnor RDN,LDN;Noah Tickle, BS, Exercise Physiologist    Virtual Visit No    Medication changes reported     No    Fall or balance concerns reported    No    Tobacco Cessation No Change    Warm-up and Cool-down Performed on first and last piece of equipment    Resistance Training Performed Yes    VAD Patient? No    PAD/SET Patient? No      Pain Assessment   Currently in Pain? No/denies             Social History   Tobacco Use  Smoking Status Former   Current packs/day: 0.00   Average packs/day: 1 pack/day for 8.0 years (8.0 ttl pk-yrs)   Types: Cigarettes   Start date: 09/12/1960   Quit date: 09/12/1968   Years since quitting: 56.0  Smokeless Tobacco Never  Tobacco Comments   quit smoking cigarettes > 40 years ago    Goals Met:  Independence with exercise equipment Exercise tolerated well No report of concerns or symptoms today Strength training completed today  Goals Unmet:  Not Applicable  Comments: Pt able to follow exercise prescription today without complaint.  Will continue to monitor for progression.    Dr. Oneil Pinal is Medical Director for [facility] Cardiac Rehabilitation.  Dr. Fuad Aleskerov is Medical Director for [facility] Pulmonary Rehabilitation.

## 2024-09-12 ENCOUNTER — Encounter

## 2024-09-12 DIAGNOSIS — Z9861 Coronary angioplasty status: Secondary | ICD-10-CM | POA: Diagnosis present

## 2024-09-12 DIAGNOSIS — Z955 Presence of coronary angioplasty implant and graft: Secondary | ICD-10-CM | POA: Diagnosis present

## 2024-09-12 NOTE — Progress Notes (Signed)
 Daily Session Note  Patient Details  Name: Todd Armstrong MRN: [id number] Date of Birth: 07-11-1931 Referring Provider:   Flowsheet Row Cardiac Rehab from 07/18/2024 in [facility] Cardiac and Pulmonary Rehab  Referring Provider Dr. Shanna Sauce    Encounter Date: 09/12/2024  Check In:  Session Check In - 09/12/24 0911       Check-In   Supervising physician immediately available to respond to emergencies See telemetry face sheet for immediately available ER MD    Location [facility]-Cardiac & Pulmonary Rehab    Staff Present Burnard Davenport [facility] Peggi, RN, DNP, NE-BC;Joseph [facility] Best, MS, Exercise Physiologist;Laura Cates RN,BSN    Virtual Visit No    Medication changes reported     No    Fall or balance concerns reported    No    Tobacco Cessation No Change    Warm-up and Cool-down Performed on first and last piece of equipment    Resistance Training Performed Yes    VAD Patient? No    PAD/SET Patient? No      Pain Assessment   Currently in Pain? No/denies             Social History   Tobacco Use  Smoking Status Former   Current packs/day: 0.00   Average packs/day: 1 pack/day for 8.0 years (8.0 ttl pk-yrs)   Types: Cigarettes   Start date: 09/12/1960   Quit date: 09/12/1968   Years since quitting: 56.0  Smokeless Tobacco Never  Tobacco Comments   quit smoking cigarettes > 40 years ago    Goals Met:  Independence with exercise equipment Exercise tolerated well No report of concerns or symptoms today Strength training completed today  Goals Unmet:  Not Applicable  Comments: Pt able to follow exercise prescription today without complaint.  Will continue to monitor for progression.    Dr. Oneil Pinal is Medical Director for [facility] Cardiac Rehabilitation.  Dr. Fuad Aleskerov is Medical Director for [facility] Pulmonary Rehabilitation.

## 2024-09-14 ENCOUNTER — Encounter

## 2024-09-14 DIAGNOSIS — Z9861 Coronary angioplasty status: Secondary | ICD-10-CM

## 2024-09-14 NOTE — Progress Notes (Signed)
 Daily Session Note  Patient Details  Name: BRITTON BERA MRN: [id number] Date of Birth: 15-May-1931 Referring Provider:   Flowsheet Row Cardiac Rehab from 07/18/2024 in [facility] Cardiac and Pulmonary Rehab  Referring Provider Dr. Shanna Sauce    Encounter Date: 09/14/2024  Check In:  Session Check In - 09/14/24 0904       Check-In   Supervising physician immediately available to respond to emergencies See telemetry face sheet for immediately available ER MD    Location [facility]-Cardiac & Pulmonary Rehab    Staff Present Burnard Davenport RN,BSN,MPA;Laura Cates RN,BSN;Joseph Rolinda RCP,RRT,BSRT;Maxon Conetta BS, Exercise Physiologist    Virtual Visit No    Medication changes reported     No    Fall or balance concerns reported    No    Tobacco Cessation No Change    Warm-up and Cool-down Performed on first and last piece of equipment    Resistance Training Performed Yes    VAD Patient? No    PAD/SET Patient? No      Pain Assessment   Currently in Pain? No/denies             Social History   Tobacco Use  Smoking Status Former   Current packs/day: 0.00   Average packs/day: 1 pack/day for 8.0 years (8.0 ttl pk-yrs)   Types: Cigarettes   Start date: 09/12/1960   Quit date: 09/12/1968   Years since quitting: 56.0  Smokeless Tobacco Never  Tobacco Comments   quit smoking cigarettes > 40 years ago    Goals Met:  Independence with exercise equipment Exercise tolerated well No report of concerns or symptoms today Strength training completed today  Goals Unmet:  Not Applicable  Comments: Pt able to follow exercise prescription today without complaint.  Will continue to monitor for progression.    Dr. Oneil Pinal is Medical Director for [facility] Cardiac Rehabilitation.  Dr. Fuad Aleskerov is Medical Director for [facility] Pulmonary Rehabilitation.

## 2024-09-17 ENCOUNTER — Encounter

## 2024-09-17 DIAGNOSIS — Z955 Presence of coronary angioplasty implant and graft: Secondary | ICD-10-CM

## 2024-09-17 DIAGNOSIS — Z9861 Coronary angioplasty status: Secondary | ICD-10-CM

## 2024-09-17 NOTE — Progress Notes (Signed)
 Daily Session Note  Patient Details  Name: GAETANO ROMBERGER MRN: [id number] Date of Birth: 1931-03-22 Referring Provider:   Flowsheet Row Cardiac Rehab from 07/18/2024 in [facility] Cardiac and Pulmonary Rehab  Referring Provider Dr. Shanna Sauce    Encounter Date: 09/17/2024  Check In:  Session Check In - 09/17/24 0917       Check-In   Supervising physician immediately available to respond to emergencies See telemetry face sheet for immediately available ER MD    Location [facility]-Cardiac & Pulmonary Rehab    Staff Present Burnard Davenport RN,BSN,MPA;Joseph [facility] RCP,RRT,BSRT;Maxon Burnell BS, Exercise Physiologist;Ottilia Pippenger Dyane BS, ACSM CEP, Exercise Physiologist    Virtual Visit No    Medication changes reported     No    Fall or balance concerns reported    No    Tobacco Cessation No Change    Warm-up and Cool-down Performed on first and last piece of equipment    Resistance Training Performed Yes    VAD Patient? No    PAD/SET Patient? No      Pain Assessment   Currently in Pain? No/denies             Social History   Tobacco Use  Smoking Status Former   Current packs/day: 0.00   Average packs/day: 1 pack/day for 8.0 years (8.0 ttl pk-yrs)   Types: Cigarettes   Start date: 09/12/1960   Quit date: 09/12/1968   Years since quitting: 56.0  Smokeless Tobacco Never  Tobacco Comments   quit smoking cigarettes > 40 years ago    Goals Met:  Independence with exercise equipment Exercise tolerated well No report of concerns or symptoms today Strength training completed today  Goals Unmet:  Not Applicable  Comments: Pt able to follow exercise prescription today without complaint.  Will continue to monitor for progression.    Dr. Oneil Pinal is Medical Director for [facility] Cardiac Rehabilitation.  Dr. Fuad Aleskerov is Medical Director for [facility] Pulmonary Rehabilitation.

## 2024-09-19 ENCOUNTER — Encounter

## 2024-09-19 DIAGNOSIS — Z9861 Coronary angioplasty status: Secondary | ICD-10-CM | POA: Diagnosis not present

## 2024-09-19 DIAGNOSIS — Z955 Presence of coronary angioplasty implant and graft: Secondary | ICD-10-CM

## 2024-09-19 NOTE — Progress Notes (Signed)
 Daily Session Note  Patient Details  Name: Todd Armstrong MRN: [id number] Date of Birth: [birth year] Referring Provider:   Flowsheet Row Cardiac Rehab from 07/18/2024 in [facility] Cardiac and Pulmonary Rehab  Referring Provider Dr. Shanna Sauce    Encounter Date: 09/19/2024  Check In:  Session Check In - 09/19/24 0929       Check-In   Supervising physician immediately available to respond to emergencies See telemetry face sheet for immediately available ER MD    Location [facility]-Cardiac & Pulmonary Rehab    Staff Present Burnard Davenport [facility] Peggi, RN, DNP, NE-BC;Joseph [facility] RN,BSN;Margaret Best, MS, Exercise Physiologist;Sewell Pitner Dyane BS, ACSM CEP, Exercise Physiologist    Virtual Visit No    Medication changes reported     No    Fall or balance concerns reported    No    Tobacco Cessation No Change    Warm-up and Cool-down Performed on first and last piece of equipment    Resistance Training Performed Yes    VAD Patient? No    PAD/SET Patient? No      Pain Assessment   Currently in Pain? No/denies             Social History   Tobacco Use  Smoking Status Former   Current packs/day: 0.00   Average packs/day: 1 pack/day for 8.0 years (8.0 ttl pk-yrs)   Types: Cigarettes   Start date: 09/12/1960   Quit date: 09/12/1968   Years since quitting: 56.0  Smokeless Tobacco Never  Tobacco Comments   quit smoking cigarettes > 40 years ago    Goals Met:  Independence with exercise equipment Exercise tolerated well No report of concerns or symptoms today Strength training completed today  Goals Unmet:  Not Applicable  Comments: Pt able to follow exercise prescription today without complaint.  Will continue to monitor for progression.    Dr. Oneil Pinal is Medical Director for [facility] Cardiac Rehabilitation.  Dr. Fuad Aleskerov is Medical Director for [facility] Pulmonary Rehabilitation.

## 2024-09-21 ENCOUNTER — Encounter: Admitting: *Deleted

## 2024-09-21 DIAGNOSIS — Z955 Presence of coronary angioplasty implant and graft: Secondary | ICD-10-CM

## 2024-09-21 DIAGNOSIS — Z9861 Coronary angioplasty status: Secondary | ICD-10-CM | POA: Diagnosis not present

## 2024-09-21 NOTE — Progress Notes (Signed)
 Daily Session Note  Patient Details  Name: Todd Armstrong MRN: [id number] Date of Birth: June 26, 1931 Referring Provider:   Flowsheet Row Cardiac Rehab from 07/18/2024 in [facility] Cardiac and Pulmonary Rehab  Referring Provider Dr. Shanna Sauce    Encounter Date: 09/21/2024  Check In:  Session Check In - 09/21/24 0925       Check-In   Supervising physician immediately available to respond to emergencies See telemetry face sheet for immediately available ER MD    Location [facility]-Cardiac & Pulmonary Rehab    Staff Present Bruno Mirza RN,BSN;Maxon Franklin BS, Exercise Physiologist;Mary Godley, RN, DNP, NE-BC;Noah Tickle, BS, Exercise Physiologist    Virtual Visit No    Medication changes reported     No    Fall or balance concerns reported    No    Warm-up and Cool-down Performed on first and last piece of equipment    Resistance Training Performed Yes    VAD Patient? No    PAD/SET Patient? No      Pain Assessment   Currently in Pain? No/denies             Social History   Tobacco Use  Smoking Status Former   Current packs/day: 0.00   Average packs/day: 1 pack/day for 8.0 years (8.0 ttl pk-yrs)   Types: Cigarettes   Start date: 09/12/1960   Quit date: 09/12/1968   Years since quitting: 56.0  Smokeless Tobacco Never  Tobacco Comments   quit smoking cigarettes > 40 years ago    Goals Met:  Independence with exercise equipment Exercise tolerated well No report of concerns or symptoms today Strength training completed today  Goals Unmet:  Not Applicable  Comments: Pt able to follow exercise prescription today without complaint.  Will continue to monitor for progression.    Dr. Oneil Pinal is Medical Director for [facility] Cardiac Rehabilitation.  Dr. Fuad Aleskerov is Medical Director for [facility] Pulmonary Rehabilitation.

## 2024-09-24 ENCOUNTER — Encounter

## 2024-09-24 DIAGNOSIS — Z9861 Coronary angioplasty status: Secondary | ICD-10-CM | POA: Diagnosis not present

## 2024-09-24 NOTE — Progress Notes (Signed)
 Daily Session Note  Patient Details  Name: Todd Armstrong MRN: [id number] Date of Birth: [birth year] Referring Provider:   Flowsheet Row Cardiac Rehab from 07/18/2024 in [facility] Cardiac and Pulmonary Rehab  Referring Provider Dr. Shanna Sauce    Encounter Date: 09/24/2024  Check In:  Session Check In - 09/24/24 0904       Check-In   Supervising physician immediately available to respond to emergencies See telemetry face sheet for immediately available ER MD    Location [facility]-Cardiac & Pulmonary Rehab    Staff Present Burnard Davenport RN,BSN,MPA;Joseph [facility] RCP,RRT,BSRT;Bethany Hirt Dyane BS, ACSM CEP, Exercise Physiologist;Maxon Conetta BS, Exercise Physiologist    Virtual Visit No    Medication changes reported     No    Fall or balance concerns reported    No    Tobacco Cessation No Change    Warm-up and Cool-down Performed on first and last piece of equipment    Resistance Training Performed Yes    VAD Patient? No    PAD/SET Patient? No      Pain Assessment   Currently in Pain? No/denies             Social History   Tobacco Use  Smoking Status Former   Current packs/day: 0.00   Average packs/day: 1 pack/day for 8.0 years (8.0 ttl pk-yrs)   Types: Cigarettes   Start date: 09/12/1960   Quit date: 09/12/1968   Years since quitting: 56.0  Smokeless Tobacco Never  Tobacco Comments   quit smoking cigarettes > 40 years ago    Goals Met:  Independence with exercise equipment Exercise tolerated well No report of concerns or symptoms today Strength training completed today  Goals Unmet:  Not Applicable  Comments: Pt able to follow exercise prescription today without complaint.  Will continue to monitor for progression.    Dr. Oneil Pinal is Medical Director for [facility] Cardiac Rehabilitation.  Dr. Fuad Aleskerov is Medical Director for [facility] Pulmonary Rehabilitation.

## 2024-09-26 ENCOUNTER — Encounter

## 2024-09-26 DIAGNOSIS — Z9861 Coronary angioplasty status: Secondary | ICD-10-CM | POA: Diagnosis not present

## 2024-09-26 NOTE — Progress Notes (Signed)
 Daily Session Note  Patient Details  Name: JAHLEN BOLLMAN MRN: [id number] Date of Birth: 12-22-1930 Referring Provider:   Flowsheet Row Cardiac Rehab from 07/18/2024 in [facility] Cardiac and Pulmonary Rehab  Referring Provider Dr. Shanna Sauce    Encounter Date: 09/26/2024  Check In:  Session Check In - 09/26/24 0918       Check-In   Supervising physician immediately available to respond to emergencies See telemetry face sheet for immediately available ER MD    Location [facility]-Cardiac & Pulmonary Rehab    Staff Present Burnard Davenport RN,BSN,MPA;[facility] Dyane BS, ACSM CEP, Exercise Physiologist;Noah Tickle, BS, Exercise Physiologist    Virtual Visit No    Medication changes reported     No    Fall or balance concerns reported    No    Tobacco Cessation No Change    Warm-up and Cool-down Performed on first and last piece of equipment    Resistance Training Performed Yes    VAD Patient? No    PAD/SET Patient? No      Pain Assessment   Currently in Pain? No/denies             Social History   Tobacco Use  Smoking Status Former   Current packs/day: 0.00   Average packs/day: 1 pack/day for 8.0 years (8.0 ttl pk-yrs)   Types: Cigarettes   Start date: 09/12/1960   Quit date: 09/12/1968   Years since quitting: 56.0  Smokeless Tobacco Never  Tobacco Comments   quit smoking cigarettes > 40 years ago    Goals Met:  Independence with exercise equipment Exercise tolerated well No report of concerns or symptoms today Strength training completed today  Goals Unmet:  Not Applicable  Comments: Pt able to follow exercise prescription today without complaint.  Will continue to monitor for progression.    Dr. Oneil Pinal is Medical Director for [facility] Cardiac Rehabilitation.  Dr. Fuad Aleskerov is Medical Director for [facility] Pulmonary Rehabilitation.

## 2024-09-28 ENCOUNTER — Encounter

## 2024-09-28 DIAGNOSIS — Z9861 Coronary angioplasty status: Secondary | ICD-10-CM

## 2024-09-28 NOTE — Progress Notes (Signed)
 Daily Session Note  Patient Details  Name: Todd Armstrong MRN: [id number] Date of Birth: 1931/08/16 Referring Provider:   Flowsheet Row Cardiac Rehab from 07/18/2024 in [facility] Cardiac and Pulmonary Rehab  Referring Provider Dr. Shanna Sauce    Encounter Date: 09/28/2024  Check In:  Session Check In - 09/28/24 0916       Check-In   Supervising physician immediately available to respond to emergencies See telemetry face sheet for immediately available ER MD    Location [facility]-Cardiac & Pulmonary Rehab    Staff Present Burnard Davenport RN,BSN,MPA;Maxon Conetta BS, Exercise Physiologist;Noah Tickle, BS, Exercise Physiologist;Laureen Delores, BS, RRT, CPFT    Virtual Visit No    Medication changes reported     No    Fall or balance concerns reported    No    Tobacco Cessation No Change    Warm-up and Cool-down Performed on first and last piece of equipment    Resistance Training Performed Yes    VAD Patient? No    PAD/SET Patient? No      Pain Assessment   Currently in Pain? No/denies             Social History   Tobacco Use  Smoking Status Former   Current packs/day: 0.00   Average packs/day: 1 pack/day for 8.0 years (8.0 ttl pk-yrs)   Types: Cigarettes   Start date: 09/12/1960   Quit date: 09/12/1968   Years since quitting: 56.0  Smokeless Tobacco Never  Tobacco Comments   quit smoking cigarettes > 40 years ago    Goals Met:  Independence with exercise equipment Exercise tolerated well No report of concerns or symptoms today Strength training completed today  Goals Unmet:  Not Applicable  Comments: Pt able to follow exercise prescription today without complaint.  Will continue to monitor for progression.    Dr. Oneil Pinal is Medical Director for [facility] Cardiac Rehabilitation.  Dr. Fuad Aleskerov is Medical Director for [facility] Pulmonary Rehabilitation.

## 2024-10-01 ENCOUNTER — Encounter

## 2024-10-03 ENCOUNTER — Encounter

## 2024-10-03 ENCOUNTER — Encounter: Payer: Self-pay | Admitting: *Deleted

## 2024-10-03 DIAGNOSIS — Z9861 Coronary angioplasty status: Secondary | ICD-10-CM

## 2024-10-03 NOTE — Progress Notes (Signed)
 Cardiac Individual Treatment Plan  Patient Details  Name: Todd Armstrong MRN: [id number] Date of Birth: 09/06/1931 Referring Provider:   Flowsheet Row Cardiac Rehab from 07/18/2024 in [facility] Cardiac and Pulmonary Rehab  Referring Provider Dr. Shanna Sauce    Initial Encounter Date:  Flowsheet Row Cardiac Rehab from 07/18/2024 in [facility] Cardiac and Pulmonary Rehab  Date 07/18/24    Visit Diagnosis: History of percutaneous coronary intervention  Patient's Home Medications on Admission:  Current Outpatient Medications:    Accu-Chek FastClix Lancets MISC, For use when checking blood sugars, Disp: , Rfl:    albuterol  (VENTOLIN  HFA) 108 (90 Base) MCG/ACT inhaler, Inhale 2 puffs into the lungs every 6 (six) hours as needed for wheezing or shortness of breath., Disp: 8 g, Rfl: 6   apixaban  (ELIQUIS ) 2.5 MG TABS tablet, Take 2.5 mg by mouth 2 (two) times daily. Per patient's daughter Dr. Charlott switched Apixaban  (Eliquis ) from 5 mg twice a day. Patient is now taking Apixaban  (Eliquis )  2.5 mg twice a day., Disp: , Rfl:    clopidogrel  (PLAVIX ) 75 MG tablet, TAKE 1 TABLET BY MOUTH DAILY, Disp: 100 tablet, Rfl: 3   Dextromethorphan  HBr 15 MG/5ML LIQD, Take 5 mLs (15 mg total) by mouth 3 (three) times daily as needed., Disp: 118 mL, Rfl: 0   metFORMIN  (GLUCOPHAGE ) 1000 MG tablet, Take 1,000 mg by mouth 2 (two) times daily with a meal. (Patient not taking: Reported on 07/11/2024), Disp: , Rfl:    metoprolol  succinate (TOPROL -XL) 25 MG 24 hr tablet, Take 25 mg by mouth daily., Disp: , Rfl:    metoprolol  tartrate (LOPRESSOR ) 25 MG tablet, Take 0.5 tablets (12.5 mg total) by mouth 2 (two) times daily. (Patient not taking: Reported on 07/11/2024), Disp: 30 tablet, Rfl: 0   nitroGLYCERIN  (NITROSTAT ) 0.4 MG SL tablet, Place 1 tablet (0.4 mg total) under the tongue every 5 (five) minutes as needed for chest pain., Disp: 25 tablet, Rfl: 6   PARoxetine (PAXIL) 20 MG tablet, Take by mouth., Disp: , Rfl:     potassium chloride  (KLOR-CON ) 10 MEQ tablet, Take 1 tablet (10 mEq total) by mouth 2 (two) times daily., Disp: 60 tablet, Rfl: 0   rosuvastatin  (CRESTOR ) 10 MG tablet, TAKE 1 TABLET BY MOUTH DAILY, Disp: 100 tablet, Rfl: 1   tamsulosin  (FLOMAX ) 0.4 MG CAPS capsule, Take 0.4 mg by mouth at bedtime. , Disp: , Rfl:    torsemide  (DEMADEX ) 20 MG tablet, Take 1/2 tablet by mouth only on Mondays, Wednesdays, and Fridays, Disp: 12 tablet, Rfl: 6   triamcinolone cream (KENALOG) 0.1 %, Apply 1 application topically as needed (for itching)., Disp: , Rfl:   Past Medical History: Past Medical History:  Diagnosis Date   Acute myocardial infarction    Anemia    Aortic insufficiency    Carotid stenosis    Chronic combined systolic and diastolic CHF (congestive heart failure) (HCC)    Coronary artery disease    PTCA and stenting of his right coronary artery. 3.5 x 16-mm Liberte stent.               Diabetes mellitus without complication (HCC)    Dyslipidemia    Emphysema (subcutaneous) (surgical) resulting from a procedure    Hyperlipemia    Hypertension    Mitral regurgitation    Partial tear of rotator cuff    right shoulder   Peripheral vascular disease    Persistent atrial fibrillation (HCC)    Pulmonary hypertension (HCC)    Stroke (HCC)  Wears dentures     Tobacco Use: Social History   Tobacco Use  Smoking Status Former   Current packs/day: 0.00   Average packs/day: 1 pack/day for 8.0 years (8.0 ttl pk-yrs)   Types: Cigarettes   Start date: 09/12/1960   Quit date: 09/12/1968   Years since quitting: 56.0  Smokeless Tobacco Never  Tobacco Comments   quit smoking cigarettes > 40 years ago    Labs: Review Flowsheet  More data exists      Latest Ref Rng & Units 02/19/2019 04/22/2020 06/03/2021 09/28/2021 09/09/2022  Labs for ITP Cardiac and Pulmonary Rehab  Cholestrol 100 - 199 mg/dL 867  851  868  - 862   LDL (calc) 0 - 99 mg/dL 37  52  38  - 33   HDL-C >39 mg/dL 76  75  79  - 88    Trlycerides 0 - 149 mg/dL 96  880  72  - 83   Hemoglobin A1c 4.8 - 5.6 % - - - 6.3  -     Exercise Target Goals: Exercise Program Goal: Individual exercise prescription set using results from initial 6 min walk test and THRR while considering  patient's activity barriers and safety.   Exercise Prescription Goal: Initial exercise prescription builds to 30-45 minutes a day of aerobic activity, 2-3 days per week.  Home exercise guidelines will be given to patient during program as part of exercise prescription that the participant will acknowledge.   Education: Aerobic Exercise: - Group verbal and visual presentation on the components of exercise prescription. Introduces F.I.T.T principle from ACSM for exercise prescriptions.  Reviews F.I.T.T. principles of aerobic exercise including progression. Written material provided at class time. Flowsheet Row Cardiac Rehab from 07/18/2024 in [facility] Cardiac and Pulmonary Rehab  Education need identified 07/18/24    Education: Resistance Exercise: - Group verbal and visual presentation on the components of exercise prescription. Introduces F.I.T.T principle from ACSM for exercise prescriptions  Reviews F.I.T.T. principles of resistance exercise including progression. Written material provided at class time.    Education: Exercise & Equipment Safety: - Individual verbal instruction and demonstration of equipment use and safety with use of the equipment. Flowsheet Row Cardiac Rehab from 07/18/2024 in [facility] Cardiac and Pulmonary Rehab  Date 07/18/24  Educator [facility]  Instruction Review Code 1- Verbalizes Understanding    Education: Exercise Physiology & General Exercise Guidelines: - Group verbal and written instruction with models to review the exercise physiology of the cardiovascular system and associated critical values. Provides general exercise guidelines with specific guidelines to those with heart or lung disease. Written material provided at class  time. Flowsheet Row Cardiac Rehab from 07/18/2024 in [facility] Cardiac and Pulmonary Rehab  Education need identified 07/18/24    Education: Flexibility, Balance, Mind/Body Relaxation: - Group verbal and visual presentation with interactive activity on the components of exercise prescription. Introduces F.I.T.T principle from ACSM for exercise prescriptions. Reviews F.I.T.T. principles of flexibility and balance exercise training including progression. Also discusses the mind body connection.  Reviews various relaxation techniques to help reduce and manage stress (i.e. Deep breathing, progressive muscle relaxation, and visualization). Balance handout provided to take home. Written material provided at class time. Flowsheet Row Pulmonary Rehab from 01/21/2022 in [facility] Cardiac and Pulmonary Rehab  Date 12/23/21  Educator AS  Instruction Review Code 1- Verbalizes Understanding    Activity Barriers & Risk Stratification:  Activity Barriers & Cardiac Risk Stratification - 07/18/24 1145       Activity Barriers & Cardiac Risk  Stratification   Activity Barriers Back Problems;Neck/Spine Problems;Balance Concerns;History of Falls    Cardiac Risk Stratification High          6 Minute Walk:  6 Minute Walk     Row Name 07/18/24 1143         6 Minute Walk   Phase Initial     Distance 700 feet     Walk Time 6 minutes     # of Rest Breaks 0     MPH 1.33     METS 1.35     RPE 11     Perceived Dyspnea  0     VO2 Peak 4.73     Symptoms No     Resting HR 79 bpm     Resting BP 124/80     Resting Oxygen Saturation  98 %     Exercise Oxygen Saturation  during 6 min walk 92 %     Max Ex. HR 105 bpm     Max Ex. BP 150/80     2 Minute Post BP 124/82        Oxygen Initial Assessment:   Oxygen Re-Evaluation:   Oxygen Discharge (Final Oxygen Re-Evaluation):   Initial Exercise Prescription:  Initial Exercise Prescription - 07/18/24 1100       Date of Initial Exercise RX and Referring  Provider   Date 07/18/24    Referring Provider Dr. Shanna Sauce      Oxygen   Maintain Oxygen Saturation 88% or higher      Recumbant Bike   Level 1    RPM 50    Watts 15    Minutes 15    METs 1.35      NuStep   Level 1    SPM 80    Minutes 15    METs 1.35      Biostep-RELP   Level 1    SPM 50    Minutes 15    METs 1.35      Track   Laps 10    Minutes 15    METs 1.54      Prescription Details   Frequency (times per week) 3    Duration Progress to 30 minutes of continuous aerobic without signs/symptoms of physical distress      Intensity   THRR 40-80% of Max Heartrate 99-119    Ratings of Perceived Exertion 11-13    Perceived Dyspnea 0-4      Progression   Progression Continue to progress workloads to maintain intensity without signs/symptoms of physical distress.      Resistance Training   Training Prescription Yes    Weight 2    Reps 10-15          Perform Capillary Blood Glucose checks as needed.  Exercise Prescription Changes:   Exercise Prescription Changes     Row Name 07/18/24 1100 08/08/24 1400 08/23/24 1800 09/19/24 0800       Response to Exercise   Blood Pressure (Admit) 124/80 116/64 130/64 112/68    Blood Pressure (Exercise) 150/80 132/54 146/60 --    Blood Pressure (Exit) 124/82 104/64 110/60 104/60    Heart Rate (Admit) 79 bpm 92 bpm 6 bpm 86 bpm    Heart Rate (Exercise) 105 bpm 123 bpm 125 bpm 115 bpm    Heart Rate (Exit) 88 bpm 95 bpm 88 bpm 84 bpm    Oxygen Saturation (Admit) 98 % -- -- --    Oxygen Saturation (Exercise) 92 % -- -- --  Oxygen Saturation (Exit) 100 % -- -- --    Rating of Perceived Exertion (Exercise) 11 13 13 13     Perceived Dyspnea (Exercise) 0 0 0 --    Symptoms none none none none    Comments 6 MWT first 2 weeks of exercise -- --    Duration -- Progress to 30 minutes of  aerobic without signs/symptoms of physical distress Progress to 30 minutes of  aerobic without signs/symptoms of physical distress  Progress to 30 minutes of  aerobic without signs/symptoms of physical distress    Intensity -- THRR unchanged THRR unchanged THRR unchanged      Progression   Progression -- Continue to progress workloads to maintain intensity without signs/symptoms of physical distress. Continue to progress workloads to maintain intensity without signs/symptoms of physical distress. Continue to progress workloads to maintain intensity without signs/symptoms of physical distress.    Average METs -- 2.06 2.26 2.13      Resistance Training   Training Prescription -- Yes Yes Yes    Weight -- 2 2 2  lb    Reps -- 10-15 10-15 10-15      Interval Training   Interval Training -- No No No      Recumbant Bike   Level -- 1 -- --    Watts -- 12 -- --    Minutes -- 15 -- --    METs -- 2.87 -- --      NuStep   Level -- 3 3 5     Minutes -- 15 15 15     METs -- 2.5 2.14 2      Biostep-RELP   Level -- 1 3 1     Minutes -- 15 15 15     METs -- 2 2 2       Track   Laps -- 17 23 23     Minutes -- 15 15 15     METs -- 1.92 2.25 2.25      Oxygen   Maintain Oxygen Saturation -- 88% or higher 88% or higher 88% or higher       Exercise Comments:   Exercise Comments     Row Name 07/23/24 0935           Exercise Comments First full day of exercise!  Patient was oriented to gym and equipment including functions, settings, policies, and procedures.  Patient's individual exercise prescription and treatment plan were reviewed.  All starting workloads were established based on the results of the 6 minute walk test done at initial orientation visit.  The plan for exercise progression was also introduced and progression will be customized based on patient's performance and goals.          Exercise Goals and Review:   Exercise Goals     Row Name 07/18/24 1155             Exercise Goals   Increase Physical Activity Yes       Intervention Provide advice, education, support and counseling about physical  activity/exercise needs.;Develop an individualized exercise prescription for aerobic and resistive training based on initial evaluation findings, risk stratification, comorbidities and participant's personal goals.       Expected Outcomes Short Term: Attend rehab on a regular basis to increase amount of physical activity.;Long Term: Add in home exercise to make exercise part of routine and to increase amount of physical activity.;Long Term: Exercising regularly at least 3-5 days a week.       Increase Strength and Stamina Yes  Intervention Provide advice, education, support and counseling about physical activity/exercise needs.;Develop an individualized exercise prescription for aerobic and resistive training based on initial evaluation findings, risk stratification, comorbidities and participant's personal goals.       Expected Outcomes Short Term: Increase workloads from initial exercise prescription for resistance, speed, and METs.;Short Term: Perform resistance training exercises routinely during rehab and add in resistance training at home;Long Term: Improve cardiorespiratory fitness, muscular endurance and strength as measured by increased METs and functional capacity ( )       Able to understand and use rate of perceived exertion (RPE) scale Yes       Intervention Provide education and explanation on how to use RPE scale       Expected Outcomes Short Term: Able to use RPE daily in rehab to express subjective intensity level;Long Term:  Able to use RPE to guide intensity level when exercising independently       Able to understand and use Dyspnea scale Yes       Intervention Provide education and explanation on how to use Dyspnea scale       Expected Outcomes Short Term: Able to use Dyspnea scale daily in rehab to express subjective sense of shortness of breath during exertion;Long Term: Able to use Dyspnea scale to guide intensity level when exercising independently       Knowledge and  understanding of Target Heart Rate Range (THRR) Yes       Intervention Provide education and explanation of THRR including how the numbers were predicted and where they are located for reference       Expected Outcomes Short Term: Able to state/look up THRR;Long Term: Able to use THRR to govern intensity when exercising independently;Short Term: Able to use daily as guideline for intensity in rehab       Able to check pulse independently Yes       Intervention Provide education and demonstration on how to check pulse in carotid and radial arteries.;Review the importance of being able to check your own pulse for safety during independent exercise       Expected Outcomes Short Term: Able to explain why pulse checking is important during independent exercise;Long Term: Able to check pulse independently and accurately       Understanding of Exercise Prescription Yes       Intervention Provide education, explanation, and written materials on patient's individual exercise prescription       Expected Outcomes Short Term: Able to explain program exercise prescription;Long Term: Able to explain home exercise prescription to exercise independently          Exercise Goals Re-Evaluation :  Exercise Goals Re-Evaluation     Row Name 07/23/24 0936 08/08/24 1420 08/23/24 1829 09/04/24 1505 09/19/24 0814     Exercise Goal Re-Evaluation   Exercise Goals Review Increase Physical Activity;Able to understand and use rate of perceived exertion (RPE) scale;Knowledge and understanding of Target Heart Rate Range (THRR);Understanding of Exercise Prescription;Increase Strength and Stamina;Able to understand and use Dyspnea scale;Able to check pulse independently Increase Physical Activity;Increase Strength and Stamina;Understanding of Exercise Prescription Increase Physical Activity;Increase Strength and Stamina;Understanding of Exercise Prescription Increase Physical Activity;Increase Strength and Stamina;Understanding of  Exercise Prescription Increase Physical Activity;Increase Strength and Stamina;Understanding of Exercise Prescription   Comments Reviewed RPE and dyspnea scale, THR and program prescription with pt today.  Pt voiced understanding and was given a copy of goals to take home. Shailen is off to a great start in the program, and was able  to attend his first few sessions during this review period. During his first sessions he was able to walk 17 laps on the track, and use the T4 nustep at level 3. We will continue to monitor his progress in the program. Audley is off to a great start in the program. He was able to increase to 23 laps on the track. He also increased to level 3 on the biostep. He maintained level 3 on the T4 nustep. We will continue to monitor his progress in the program. Koden has not attended rehab since the last review. He is on vacation for the month of september, and plans to return to rehab when he gets back. We will continue to monitor his progress in the program when he returns. Shankar returned to rehab after taking a month off for vacation. He did well walking the track as he continues to reach up to 23 laps. He also improved to level 5 on the T4 nustep. We will continue to monitor his progress in the program.   Expected Outcomes Short: Use RPE daily to regulate intensity. Long: Follow program prescription in THR. Short: Continue to follow exercise prescription. Long: Continue exercise to improve strength and stamina. Short: Continue to push for more laps on the track. Long: Continue exercise to improve strength and stamina. Short: Return to rehab after vacation. Long: Graduate. Short: Continue to progressively increase workloads. Long: Continue exercise to improve strength and stamina.    Row Name 09/26/24 1028             Exercise Goal Re-Evaluation   Exercise Goals Review Increase Physical Activity;Increase Strength and Stamina       Comments Talked with Carolton's daugter as he  is not able to orally communicate. She states that he is very active at home and walks on days he does not come to class.       Expected Outcomes Short: continue to be active at home. Go over home exercise guidelines with patient. Long: maintain independent exercise routine upon graduation from cardiac rehab.          Discharge Exercise Prescription (Final Exercise Prescription Changes):  Exercise Prescription Changes - 09/19/24 0800       Response to Exercise   Blood Pressure (Admit) 112/68    Blood Pressure (Exit) 104/60    Heart Rate (Admit) 86 bpm    Heart Rate (Exercise) 115 bpm    Heart Rate (Exit) 84 bpm    Rating of Perceived Exertion (Exercise) 13    Symptoms none    Duration Progress to 30 minutes of  aerobic without signs/symptoms of physical distress    Intensity THRR unchanged      Progression   Progression Continue to progress workloads to maintain intensity without signs/symptoms of physical distress.    Average METs 2.13      Resistance Training   Training Prescription Yes    Weight 2 lb    Reps 10-15      Interval Training   Interval Training No      NuStep   Level 5    Minutes 15    METs 2      Biostep-RELP   Level 1    Minutes 15    METs 2      Track   Laps 23    Minutes 15    METs 2.25      Oxygen   Maintain Oxygen Saturation 88% or higher  Nutrition:  Target Goals: Understanding of nutrition guidelines, daily intake of sodium 1500mg , cholesterol 200mg , calories 30% from fat and 7% or less from saturated fats, daily to have 5 or more servings of fruits and vegetables.  Education: Nutrition 1 -Group instruction provided by verbal, written material, interactive activities, discussions, models, and posters to present general guidelines for heart healthy nutrition including macronutrients, label reading, and promoting whole foods over processed counterparts. Education serves as Pensions consultant of discussion of heart healthy eating for  all. Written material provided at class time.    Education: Nutrition 2 -Group instruction provided by verbal, written material, interactive activities, discussions, models, and posters to present general guidelines for heart healthy nutrition including sodium, cholesterol, and saturated fat. Providing guidance of habit forming to improve blood pressure, cholesterol, and body weight. Written material provided at class time.     Biometrics:  Pre Biometrics - 07/18/24 1156       Pre Biometrics   Height 5' 5.5 (1.664 m)    Weight 115 lb 8 oz (52.4 kg)    Waist Circumference 33 inches    Hip Circumference 36 inches    Waist to Hip Ratio 0.92 %    BMI (Calculated) 18.92    Single Leg Stand 5.07 seconds           Nutrition Therapy Plan and Nutrition Goals:   Nutrition Assessments:  MEDIFICTS Score Key: >=70 Need to make dietary changes  40-70 Heart Healthy Diet <= 40 Therapeutic Level Cholesterol Diet  Flowsheet Row Pulmonary Rehab from 01/27/2022 in [facility] Cardiac and Pulmonary Rehab  Picture Your Plate Total Score on Admission 63  Picture Your Plate Total Score on Discharge 56   Picture Your Plate Scores: <59 Unhealthy dietary pattern with much room for improvement. 41-50 Dietary pattern unlikely to meet recommendations for good health and room for improvement. 51-60 More healthful dietary pattern, with some room for improvement.  >60 Healthy dietary pattern, although there may be some specific behaviors that could be improved.    Nutrition Goals Re-Evaluation:  Nutrition Goals Re-Evaluation     Row Name 09/26/24 1019             Goals   Comment Talked with patient's daughter as patient is unable to orally communicate. She reports that he is eating pureed foods and supplemental drinks.       Expected Outcome Short: continue to eatpureed foods to try to meat nutrition needs. Long: maintain healthy weight.          Nutrition Goals Discharge (Final Nutrition  Goals Re-Evaluation):  Nutrition Goals Re-Evaluation - 09/26/24 1019       Goals   Comment Talked with patient's daughter as patient is unable to orally communicate. She reports that he is eating pureed foods and supplemental drinks.    Expected Outcome Short: continue to eatpureed foods to try to meat nutrition needs. Long: maintain healthy weight.          Psychosocial: Target Goals: Acknowledge presence or absence of significant depression and/or stress, maximize coping skills, provide positive support system. Participant is able to verbalize types and ability to use techniques and skills needed for reducing stress and depression.   Education: Stress, Anxiety, and Depression - Group verbal and visual presentation to define topics covered.  Reviews how body is impacted by stress, anxiety, and depression.  Also discusses healthy ways to reduce stress and to treat/manage anxiety and depression. Written material provided at class time.   Education: Sleep Hygiene -Provides  group verbal and written instruction about how sleep can affect your health.  Define sleep hygiene, discuss sleep cycles and impact of sleep habits. Review good sleep hygiene tips.   Initial Review & Psychosocial Screening:  Initial Psych Review & Screening - 07/11/24 1043       Initial Review   Current issues with Current Stress Concerns;Current Psychotropic Meds    Source of Stress Concerns Chronic Illness      Family Dynamics   Good Support System? Yes      Barriers   Psychosocial barriers to participate in program There are no identifiable barriers or psychosocial needs.;The patient should benefit from training in stress management and relaxation.      Screening Interventions   Interventions To provide support and resources with identified psychosocial needs;Encouraged to exercise;Provide feedback about the scores to participant    Expected Outcomes Short Term goal: Utilizing psychosocial counselor, staff  and physician to assist with identification of specific Stressors or current issues interfering with healing process. Setting desired goal for each stressor or current issue identified.;Long Term Goal: Stressors or current issues are controlled or eliminated.;Short Term goal: Identification and review with participant of any Quality of Life or Depression concerns found by scoring the questionnaire.;Long Term goal: The participant improves quality of Life and PHQ9 Scores as seen by post scores and/or verbalization of changes          Quality of Life Scores:   Quality of Life - 07/18/24 1201       Quality of Life   Select Quality of Life      Quality of Life Scores   Health/Function Pre 8.25 %    Socioeconomic Pre 30 %    Psych/Spiritual Pre 22.5 %    Family Pre 30 %    GLOBAL Pre 16.4 %         Scores of 19 and below usually indicate a poorer quality of life in these areas.  A difference of  2-3 points is a clinically meaningful difference.  A difference of 2-3 points in the total score of the Quality of Life Index has been associated with significant improvement in overall quality of life, self-image, physical symptoms, and general health in studies assessing change in quality of life.  PHQ-9: Review Flowsheet       09/26/2024 07/18/2024 01/27/2022 10/27/2021  Depression screen PHQ 2/9  Decreased Interest 3 1 0 0  Down, Depressed, Hopeless 2 2 0 0  PHQ - 2 Score 5 3 0 0  Altered sleeping 3 3 2 3   Tired, decreased energy 3 2 1 1   Change in appetite 3 3 2 3   Feeling bad or failure about yourself  0 2 0 0  Trouble concentrating 3 3 0 0  Moving slowly or fidgety/restless 2 3 2  0  Suicidal thoughts 0 0 0 0  PHQ-9 Score 19 19 7 7   Difficult doing work/chores Extremely dIfficult Very difficult Not difficult at all Not difficult at all   Interpretation of Total Score  Total Score Depression Severity:  1-4 = Minimal depression, 5-9 = Mild depression, 10-14 = Moderate depression,  15-19 = Moderately severe depression, 20-27 = Severe depression   Psychosocial Evaluation and Intervention:  Psychosocial Evaluation - 07/11/24 1054       Psychosocial Evaluation & Interventions   Interventions Encouraged to exercise with the program and follow exercise prescription;Stress management education;Relaxation education    Comments Mr. Antuna is coming to cardiac rehab. 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 He recently got switched to pureed foods which he is not enjoying very much. His doctor has started him on Paxil to help with his mental health symptoms that have increased since his health decline. His daughter was on the phone orientation and states that they are hopeful this will help with his depression symptoms and stress. He reports no sleep concerns at this time. They are hoping he can gain some stamina during the program    Expected Outcomes Short: attend cardiac rehab for education and exercise Long: develop and maintain positive self care habits    Continue Psychosocial Services  Follow up required by staff          Psychosocial Re-Evaluation:  Psychosocial Re-Evaluation     Row Name 09/26/24 1025             Psychosocial Re-Evaluation   Current issues with None Identified       Comments Re-evaluated PHQ 9 today. Patient's score remained the same at 19. He continues take his medications.       Expected Outcomes Short: contine to attend cardiac rehab for mental health benefits of exercise. Long: maintain good mental health routine.       Interventions Encouraged to attend Pulmonary Rehabilitation for the exercise       Continue Psychosocial Services  Follow up required by staff          Psychosocial Discharge (Final Psychosocial Re-Evaluation):  Psychosocial Re-Evaluation - 09/26/24 1025       Psychosocial Re-Evaluation   Current issues with None Identified    Comments Re-evaluated PHQ 9 today. Patient's score remained  the same at 19. He continues take his medications.    Expected Outcomes Short: contine to attend cardiac rehab for mental health benefits of exercise. Long: maintain good mental health routine.    Interventions Encouraged to attend Pulmonary Rehabilitation for the exercise    Continue Psychosocial Services  Follow up required by staff          Vocational Rehabilitation: Provide vocational rehab assistance to qualifying candidates.   Vocational Rehab Evaluation & Intervention:  Vocational Rehab - 07/11/24 1043       Initial Vocational Rehab Evaluation & Intervention   Assessment shows need for Vocational Rehabilitation No          Education: Education Goals: Education classes will be provided on a variety of topics geared toward better understanding of heart health and risk factor modification. Participant will state understanding/return demonstration of topics presented as noted by education test scores.  Learning Barriers/Preferences:  Learning Barriers/Preferences - 07/11/24 1149       Learning Barriers/Preferences   Learning Barriers --   nonverbal   Learning Preferences Individual Instruction          General Cardiac Education Topics:  AED/CPR: - Group verbal and written instruction with the use of models to demonstrate the basic use of the AED with the basic ABC's of resuscitation.   Test and Procedures: - Group verbal and visual presentation and models provide information about basic cardiac anatomy and function. Reviews the testing methods done to diagnose heart disease and the outcomes of the test results. Describes the treatment choices: Medical Management, Angioplasty, or Coronary Bypass Surgery for treating various heart conditions including Myocardial Infarction, Angina, Valve Disease, and Cardiac Arrhythmias. Written material provided at class time.   Medication Safety: - Group verbal and visual instruction to review commonly prescribed medications for  heart and lung disease. Reviews the  medication, class of the drug, and side effects. Includes the steps to properly store meds and maintain the prescription regimen. Written material provided at class time. Flowsheet Row Pulmonary Rehab from 01/21/2022 in [facility] Cardiac and Pulmonary Rehab  Date 12/30/21  Educator SB  Instruction Review Code 1- Verbalizes Understanding    Intimacy: - Group verbal instruction through game format to discuss how heart and lung disease can affect sexual intimacy. Written material provided at class time. Flowsheet Row Cardiac Rehab from 07/18/2024 in [facility] Cardiac and Pulmonary Rehab  Education need identified 07/18/24    Know Your Numbers and Heart Failure: - Group verbal and visual instruction to discuss disease risk factors for cardiac and pulmonary disease and treatment options.  Reviews associated critical values for Overweight/Obesity, Hypertension, Cholesterol, and Diabetes.  Discusses basics of heart failure: signs/symptoms and treatments.  Introduces Heart Failure Zone chart for action plan for heart failure. Written material provided at class time. Flowsheet Row Cardiac Rehab from 07/18/2024 in [facility] Cardiac and Pulmonary Rehab  Education need identified 07/18/24    Infection Prevention: - Provides verbal and written material to individual with discussion of infection control including proper hand washing and proper equipment cleaning during exercise session. Flowsheet Row Cardiac Rehab from 07/18/2024 in [facility] Cardiac and Pulmonary Rehab  Date 07/18/24  Educator [facility]  Instruction Review Code 1- Verbalizes Understanding    Falls Prevention: - Provides verbal and written material to individual with discussion of falls prevention and safety. Flowsheet Row Cardiac Rehab from 07/18/2024 in [facility] Cardiac and Pulmonary Rehab  Date 07/18/24  Educator [facility]  Instruction Review Code 1- Verbalizes Understanding    Other: -Provides group and verbal instruction on various  topics (see comments)   Knowledge Questionnaire Score:  Knowledge Questionnaire Score - 07/18/24 1202       Knowledge Questionnaire Score   Pre Score 20/26          Core Components/Risk Factors/Patient Goals at Admission:  Personal Goals and Risk Factors at Admission - 07/18/24 1156       Core Components/Risk Factors/Patient Goals on Admission    Weight Management Yes;Weight Gain    Intervention Weight Management: Develop a combined nutrition and exercise program designed to reach desired caloric intake, while maintaining appropriate intake of nutrient and fiber, sodium and fats, and appropriate energy expenditure required for the weight goal.;Weight Management: Provide education and appropriate resources to help participant work on and attain dietary goals.;Weight Management/Obesity: Establish reasonable short term and long term weight goals.    Admit Weight 115 lb 8 oz (52.4 kg)    Goal Weight: Short Term 120 lb (54.4 kg)    Goal Weight: Long Term 130 lb (59 kg)    Expected Outcomes Short Term: Continue to assess and modify interventions until short term weight is achieved;Long Term: Adherence to nutrition and physical activity/exercise program aimed toward attainment of established weight goal;Understanding recommendations for meals to include 15-35% energy as protein, 25-35% energy from fat, 35-60% energy from carbohydrates, less than 200mg  of dietary cholesterol, 20-35 gm of total fiber daily;Weight Gain: Understanding of general recommendations for a high calorie, high protein meal plan that promotes weight gain by distributing calorie intake throughout the day with the consumption for 4-5 meals, snacks, and/or supplements;Understanding of distribution of calorie intake throughout the day with the consumption of 4-5 meals/snacks    Diabetes Yes    Intervention Provide education about signs/symptoms and action to take for hypo/hyperglycemia.;Provide education about proper nutrition,  including hydration, and aerobic/resistive exercise  prescription along with prescribed medications to achieve blood glucose in normal ranges: Fasting glucose 65-99 mg/dL    Expected Outcomes Short Term: Participant verbalizes understanding of the signs/symptoms and immediate care of hyper/hypoglycemia, proper foot care and importance of medication, aerobic/resistive exercise and nutrition plan for blood glucose control.;Long Term: Attainment of HbA1C < 7%.    Heart Failure Yes    Intervention Provide a combined exercise and nutrition program that is supplemented with education, support and counseling about heart failure. Directed toward relieving symptoms such as shortness of breath, decreased exercise tolerance, and extremity edema.    Expected Outcomes Improve functional capacity of life;Short term: Attendance in program 2-3 days a week with increased exercise capacity. Reported lower sodium intake. Reported increased fruit and vegetable intake. Reports medication compliance.;Short term: Daily weights obtained and reported for increase. Utilizing diuretic protocols set by physician.;Long term: Adoption of self-care skills and reduction of barriers for early signs and symptoms recognition and intervention leading to self-care maintenance.    Hypertension Yes    Intervention Provide education on lifestyle modifcations including regular physical activity/exercise, weight management, moderate sodium restriction and increased consumption of fresh fruit, vegetables, and low fat dairy, alcohol moderation, and smoking cessation.;Monitor prescription use compliance.    Expected Outcomes Short Term: Continued assessment and intervention until BP is < 140/37mm HG in hypertensive participants. < 130/44mm HG in hypertensive participants with diabetes, heart failure or chronic kidney disease.;Long Term: Maintenance of blood pressure at goal levels.    Lipids Yes    Intervention Provide education and support for  participant on nutrition & aerobic/resistive exercise along with prescribed medications to achieve LDL 70mg , HDL >40mg .    Expected Outcomes Short Term: Participant states understanding of desired cholesterol values and is compliant with medications prescribed. Participant is following exercise prescription and nutrition guidelines.;Long Term: Cholesterol controlled with medications as prescribed, with individualized exercise RX and with personalized nutrition plan. Value goals: LDL < 70mg , HDL > 40 mg.          Education:Diabetes - Individual verbal and written instruction to review signs/symptoms of diabetes, desired ranges of glucose level fasting, after meals and with exercise. Acknowledge that pre and post exercise glucose checks will be done for 3 sessions at entry of program. Flowsheet Row Cardiac Rehab from 07/18/2024 in [facility] Cardiac and Pulmonary Rehab  Date 07/18/24  Educator [facility]  Instruction Review Code 1- Verbalizes Understanding    Core Components/Risk Factors/Patient Goals Review:   Goals and Risk Factor Review     Row Name 09/26/24 1022             Core Components/Risk Factors/Patient Goals Review   Personal Goals Review Heart Failure;Hypertension;Lipids;Diabetes       Review Talked with patient's daughter as patient is unable to orally communicate. She reported that he does take all medications to manage diabetes, blood pressure, and cholesterol. He also has a scale at home he can use to monitor weight and look for signs of heart failure.       Expected Outcomes Short: check BP at home. Long: control cardiac risk factors.          Core Components/Risk Factors/Patient Goals at Discharge (Final Review):   Goals and Risk Factor Review - 09/26/24 1022       Core Components/Risk Factors/Patient Goals Review   Personal Goals Review Heart Failure;Hypertension;Lipids;Diabetes    Review Talked with patient's daughter as patient is unable to orally communicate. She reported  that he does take all medications to  manage diabetes, blood pressure, and cholesterol. He also has a scale at home he can use to monitor weight and look for signs of heart failure.    Expected Outcomes Short: check BP at home. Long: control cardiac risk factors.          ITP Comments:  ITP Comments     Row Name 07/11/24 1106 07/18/24 1136 07/23/24 0935 08/08/24 0920 09/05/24 1355   ITP Comments Initial phone call completed. Diagnosis can be found in [facility]. EP Orientation scheduled for Wednesday 8/6 at 10am. Completed and gym orientation for cardiac rehab. Initial ITP created and sent for review to Dr. Oneil Pinal, Medical Director. First full day of exercise!  Patient was oriented to gym and equipment including functions, settings, policies, and procedures.  Patient's individual exercise prescription and treatment plan were reviewed.  All starting workloads were established based on the results of the 6 minute walk test done at initial orientation visit.  The plan for exercise progression was also introduced and progression will be customized based on patient's performance and goals. 30 Day review completed. Medical Director ITP review done; changes made as directed and signed approval by Medical Director. New to program. 30 Day review completed. Medical Director ITP review done; changes made as directed and signed approval by Medical Director.    Row Name 10/03/24 1006           ITP Comments 30 Day review completed. Medical Director ITP review done; changes made as directed and signed approval by Medical Director.          Comments: 30 day review

## 2024-10-05 ENCOUNTER — Encounter

## 2024-10-08 ENCOUNTER — Encounter

## 2024-10-10 ENCOUNTER — Encounter

## 2024-10-12 ENCOUNTER — Encounter

## 2024-10-15 ENCOUNTER — Encounter

## 2024-10-17 ENCOUNTER — Encounter

## 2024-10-31 DIAGNOSIS — Z9861 Coronary angioplasty status: Secondary | ICD-10-CM

## 2024-10-31 DIAGNOSIS — Z955 Presence of coronary angioplasty implant and graft: Secondary | ICD-10-CM

## 2024-10-31 NOTE — Progress Notes (Signed)
 Cardiac Individual Treatment Plan  Patient Details  Name: Todd Armstrong MRN: [id number] Date of Birth: 07/26/1931 Referring Provider:   Flowsheet Row Cardiac Rehab from 07/18/2024 in [facility] Cardiac and Pulmonary Rehab  Referring Provider Dr. Shanna Sauce    Initial Encounter Date:  Flowsheet Row Cardiac Rehab from 07/18/2024 in [facility] Cardiac and Pulmonary Rehab  Date 07/18/24    Visit Diagnosis: History of percutaneous coronary intervention  Status post coronary artery stent placement  Patient's Home Medications on Admission:  Current Outpatient Medications:    Accu-Chek FastClix Lancets MISC, For use when checking blood sugars, Disp: , Rfl:    albuterol  (VENTOLIN  HFA) 108 (90 Base) MCG/ACT inhaler, Inhale 2 puffs into the lungs every 6 (six) hours as needed for wheezing or shortness of breath., Disp: 8 g, Rfl: 6   apixaban  (ELIQUIS ) 2.5 MG TABS tablet, Take 2.5 mg by mouth 2 (two) times daily. Per patient's daughter Dr. Charlott switched Apixaban  (Eliquis ) from 5 mg twice a day. Patient is now taking Apixaban  (Eliquis )  2.5 mg twice a day., Disp: , Rfl:    clopidogrel  (PLAVIX ) 75 MG tablet, TAKE 1 TABLET BY MOUTH DAILY, Disp: 100 tablet, Rfl: 3   Dextromethorphan  HBr 15 MG/5ML LIQD, Take 5 mLs (15 mg total) by mouth 3 (three) times daily as needed., Disp: 118 mL, Rfl: 0   metFORMIN  (GLUCOPHAGE ) 1000 MG tablet, Take 1,000 mg by mouth 2 (two) times daily with a meal. (Patient not taking: Reported on 07/11/2024), Disp: , Rfl:    metoprolol  succinate (TOPROL -XL) 25 MG 24 hr tablet, Take 25 mg by mouth daily., Disp: , Rfl:    metoprolol  tartrate (LOPRESSOR ) 25 MG tablet, Take 0.5 tablets (12.5 mg total) by mouth 2 (two) times daily. (Patient not taking: Reported on 07/11/2024), Disp: 30 tablet, Rfl: 0   nitroGLYCERIN  (NITROSTAT ) 0.4 MG SL tablet, Place 1 tablet (0.4 mg total) under the tongue every 5 (five) minutes as needed for chest pain., Disp: 25 tablet, Rfl: 6   PARoxetine (PAXIL) 20 MG  tablet, Take by mouth., Disp: , Rfl:    potassium chloride  (KLOR-CON ) 10 MEQ tablet, Take 1 tablet (10 mEq total) by mouth 2 (two) times daily., Disp: 60 tablet, Rfl: 0   rosuvastatin  (CRESTOR ) 10 MG tablet, TAKE 1 TABLET BY MOUTH DAILY, Disp: 100 tablet, Rfl: 1   tamsulosin  (FLOMAX ) 0.4 MG CAPS capsule, Take 0.4 mg by mouth at bedtime. , Disp: , Rfl:    torsemide  (DEMADEX ) 20 MG tablet, Take 1/2 tablet by mouth only on Mondays, Wednesdays, and Fridays, Disp: 12 tablet, Rfl: 6   triamcinolone cream (KENALOG) 0.1 %, Apply 1 application topically as needed (for itching)., Disp: , Rfl:   Past Medical History: Past Medical History:  Diagnosis Date   Acute myocardial infarction    Anemia    Aortic insufficiency    Carotid stenosis    Chronic combined systolic and diastolic CHF (congestive heart failure) (HCC)    Coronary artery disease    PTCA and stenting of his right coronary artery. 3.5 x 16-mm Liberte stent.               Diabetes mellitus without complication (HCC)    Dyslipidemia    Emphysema (subcutaneous) (surgical) resulting from a procedure    Hyperlipemia    Hypertension    Mitral regurgitation    Partial tear of rotator cuff    right shoulder   Peripheral vascular disease    Persistent atrial fibrillation [facility])    Pulmonary  hypertension (HCC)    Stroke [facility])    Wears dentures     Tobacco Use: Social History   Tobacco Use  Smoking Status Former   Current packs/day: 0.00   Average packs/day: 1 pack/day for 8.0 years (8.0 ttl pk-yrs)   Types: Cigarettes   Start date: 09/12/1960   Quit date: 09/12/1968   Years since quitting: 56.1  Smokeless Tobacco Never  Tobacco Comments   quit smoking cigarettes > 40 years ago    Labs: Review Flowsheet  More data exists      Latest Ref Rng & Units 02/19/2019 04/22/2020 06/03/2021 09/28/2021 09/09/2022  Labs for ITP Cardiac and Pulmonary Rehab  Cholestrol 100 - 199 mg/dL 867  851  868  - 862   LDL (calc) 0 - 99 mg/dL 37  52  38  -  33   HDL-C >39 mg/dL 76  75  79  - 88   Trlycerides 0 - 149 mg/dL 96  880  72  - 83   Hemoglobin A1c 4.8 - 5.6 % - - - 6.3  -     Exercise Target Goals: Exercise Program Goal: Individual exercise prescription set using results from initial 6 min walk test and THRR while considering  patient's activity barriers and safety.   Exercise Prescription Goal: Initial exercise prescription builds to 30-45 minutes a day of aerobic activity, 2-3 days per week.  Home exercise guidelines will be given to patient during program as part of exercise prescription that the participant will acknowledge.   Education: Aerobic Exercise: - Group verbal and visual presentation on the components of exercise prescription. Introduces F.I.T.T principle from ACSM for exercise prescriptions.  Reviews F.I.T.T. principles of aerobic exercise including progression. Written material provided at class time. Flowsheet Row Cardiac Rehab from 07/18/2024 in [facility] Cardiac and Pulmonary Rehab  Education need identified 07/18/24    Education: Resistance Exercise: - Group verbal and visual presentation on the components of exercise prescription. Introduces F.I.T.T principle from ACSM for exercise prescriptions  Reviews F.I.T.T. principles of resistance exercise including progression. Written material provided at class time.    Education: Exercise & Equipment Safety: - Individual verbal instruction and demonstration of equipment use and safety with use of the equipment. Flowsheet Row Cardiac Rehab from 07/18/2024 in [facility] Cardiac and Pulmonary Rehab  Date 07/18/24  Educator [facility]  Instruction Review Code 1- Verbalizes Understanding    Education: Exercise Physiology & General Exercise Guidelines: - Group verbal and written instruction with models to review the exercise physiology of the cardiovascular system and associated critical values. Provides general exercise guidelines with specific guidelines to those with heart or lung  disease. Written material provided at class time. Flowsheet Row Cardiac Rehab from 07/18/2024 in [facility] Cardiac and Pulmonary Rehab  Education need identified 07/18/24    Education: Flexibility, Balance, Mind/Body Relaxation: - Group verbal and visual presentation with interactive activity on the components of exercise prescription. Introduces F.I.T.T principle from ACSM for exercise prescriptions. Reviews F.I.T.T. principles of flexibility and balance exercise training including progression. Also discusses the mind body connection.  Reviews various relaxation techniques to help reduce and manage stress (i.e. Deep breathing, progressive muscle relaxation, and visualization). Balance handout provided to take home. Written material provided at class time. Flowsheet Row Pulmonary Rehab from 01/21/2022 in [facility] Cardiac and Pulmonary Rehab  Date 12/23/21  Educator AS  Instruction Review Code 1- Verbalizes Understanding    Activity Barriers & Risk Stratification:  Activity Barriers & Cardiac Risk Stratification - 07/18/24 1145  Activity Barriers & Cardiac Risk Stratification   Activity Barriers Back Problems;Neck/Spine Problems;Balance Concerns;History of Falls    Cardiac Risk Stratification High          6 Minute Walk:  6 Minute Walk     Row Name 07/18/24 1143         6 Minute Walk   Phase Initial     Distance 700 feet     Walk Time 6 minutes     # of Rest Breaks 0     MPH 1.33     METS 1.35     RPE 11     Perceived Dyspnea  0     VO2 Peak 4.73     Symptoms No     Resting HR 79 bpm     Resting BP 124/80     Resting Oxygen Saturation  98 %     Exercise Oxygen Saturation  during 6 min walk 92 %     Max Ex. HR 105 bpm     Max Ex. BP 150/80     2 Minute Post BP 124/82        Oxygen Initial Assessment:   Oxygen Re-Evaluation:   Oxygen Discharge (Final Oxygen Re-Evaluation):   Initial Exercise Prescription:  Initial Exercise Prescription - 07/18/24 1100       Date  of Initial Exercise RX and Referring Provider   Date 07/18/24    Referring Provider Dr. Shanna Sauce      Oxygen   Maintain Oxygen Saturation 88% or higher      Recumbant Bike   Level 1    RPM 50    Watts 15    Minutes 15    METs 1.35      NuStep   Level 1    SPM 80    Minutes 15    METs 1.35      Biostep-RELP   Level 1    SPM 50    Minutes 15    METs 1.35      Track   Laps 10    Minutes 15    METs 1.54      Prescription Details   Frequency (times per week) 3    Duration Progress to 30 minutes of continuous aerobic without signs/symptoms of physical distress      Intensity   THRR 40-80% of Max Heartrate 99-119    Ratings of Perceived Exertion 11-13    Perceived Dyspnea 0-4      Progression   Progression Continue to progress workloads to maintain intensity without signs/symptoms of physical distress.      Resistance Training   Training Prescription Yes    Weight 2    Reps 10-15          Perform Capillary Blood Glucose checks as needed.  Exercise Prescription Changes:   Exercise Prescription Changes     Row Name 07/18/24 1100 08/08/24 1400 08/23/24 1800 09/19/24 0800 10/04/24 1000     Response to Exercise   Blood Pressure (Admit) 124/80 116/64 130/64 112/68 110/62   Blood Pressure (Exercise) 150/80 132/54 146/60 -- --   Blood Pressure (Exit) 124/82 104/64 110/60 104/60 102/60   Heart Rate (Admit) 79 bpm 92 bpm 6 bpm 86 bpm 76 bpm   Heart Rate (Exercise) 105 bpm 123 bpm 125 bpm 115 bpm 117 bpm   Heart Rate (Exit) 88 bpm 95 bpm 88 bpm 84 bpm 86 bpm   Oxygen Saturation (Admit) 98 % -- -- -- --  Oxygen Saturation (Exercise) 92 % -- -- -- --   Oxygen Saturation (Exit) 100 % -- -- -- --   Rating of Perceived Exertion (Exercise) 11 13 13 13 13    Perceived Dyspnea (Exercise) 0 0 0 -- --   Symptoms none none none none none   Comments 6 MWT first 2 weeks of exercise -- -- --   Duration -- Progress to 30 minutes of  aerobic without signs/symptoms of  physical distress Progress to 30 minutes of  aerobic without signs/symptoms of physical distress Progress to 30 minutes of  aerobic without signs/symptoms of physical distress Progress to 30 minutes of  aerobic without signs/symptoms of physical distress   Intensity -- THRR unchanged THRR unchanged THRR unchanged THRR unchanged     Progression   Progression -- Continue to progress workloads to maintain intensity without signs/symptoms of physical distress. Continue to progress workloads to maintain intensity without signs/symptoms of physical distress. Continue to progress workloads to maintain intensity without signs/symptoms of physical distress. Continue to progress workloads to maintain intensity without signs/symptoms of physical distress.   Average METs -- 2.06 2.26 2.13 2.24     Resistance Training   Training Prescription -- Yes Yes Yes Yes   Weight -- 2 2 2  lb 2 lb   Reps -- 10-15 10-15 10-15 10-15     Interval Training   Interval Training -- No No No No     Recumbant Bike   Level -- 1 -- -- --   Watts -- 12 -- -- --   Minutes -- 15 -- -- --   METs -- 2.87 -- -- --     NuStep   Level -- 3 3 5 3    Minutes -- 15 15 15 15    METs -- 2.5 2.14 2 2.6     Biostep-RELP   Level -- 1 3 1 2    Minutes -- 15 15 15 15    METs -- 2 2 2 2      Track   Laps -- 17 23 23 27    Minutes -- 15 15 15 15    METs -- 1.92 2.25 2.25 2.47     Oxygen   Maintain Oxygen Saturation -- 88% or higher 88% or higher 88% or higher 88% or higher      Exercise Comments:   Exercise Comments     Row Name 07/23/24 0935           Exercise Comments First full day of exercise!  Patient was oriented to gym and equipment including functions, settings, policies, and procedures.  Patient's individual exercise prescription and treatment plan were reviewed.  All starting workloads were established based on the results of the 6 minute walk test done at initial orientation visit.  The plan for exercise progression  was also introduced and progression will be customized based on patient's performance and goals.          Exercise Goals and Review:   Exercise Goals     Row Name 07/18/24 1155             Exercise Goals   Increase Physical Activity Yes       Intervention Provide advice, education, support and counseling about physical activity/exercise needs.;Develop an individualized exercise prescription for aerobic and resistive training based on initial evaluation findings, risk stratification, comorbidities and participant's personal goals.       Expected Outcomes Short Term: Attend rehab on a regular basis to increase amount of physical activity.;Long Term: Add in  home exercise to make exercise part of routine and to increase amount of physical activity.;Long Term: Exercising regularly at least 3-5 days a week.       Increase Strength and Stamina Yes       Intervention Provide advice, education, support and counseling about physical activity/exercise needs.;Develop an individualized exercise prescription for aerobic and resistive training based on initial evaluation findings, risk stratification, comorbidities and participant's personal goals.       Expected Outcomes Short Term: Increase workloads from initial exercise prescription for resistance, speed, and METs.;Short Term: Perform resistance training exercises routinely during rehab and add in resistance training at home;Long Term: Improve cardiorespiratory fitness, muscular endurance and strength as measured by increased METs and functional capacity ( )       Able to understand and use rate of perceived exertion (RPE) scale Yes       Intervention Provide education and explanation on how to use RPE scale       Expected Outcomes Short Term: Able to use RPE daily in rehab to express subjective intensity level;Long Term:  Able to use RPE to guide intensity level when exercising independently       Able to understand and use Dyspnea scale Yes        Intervention Provide education and explanation on how to use Dyspnea scale       Expected Outcomes Short Term: Able to use Dyspnea scale daily in rehab to express subjective sense of shortness of breath during exertion;Long Term: Able to use Dyspnea scale to guide intensity level when exercising independently       Knowledge and understanding of Target Heart Rate Range (THRR) Yes       Intervention Provide education and explanation of THRR including how the numbers were predicted and where they are located for reference       Expected Outcomes Short Term: Able to state/look up THRR;Long Term: Able to use THRR to govern intensity when exercising independently;Short Term: Able to use daily as guideline for intensity in rehab       Able to check pulse independently Yes       Intervention Provide education and demonstration on how to check pulse in carotid and radial arteries.;Review the importance of being able to check your own pulse for safety during independent exercise       Expected Outcomes Short Term: Able to explain why pulse checking is important during independent exercise;Long Term: Able to check pulse independently and accurately       Understanding of Exercise Prescription Yes       Intervention Provide education, explanation, and written materials on patient's individual exercise prescription       Expected Outcomes Short Term: Able to explain program exercise prescription;Long Term: Able to explain home exercise prescription to exercise independently          Exercise Goals Re-Evaluation :  Exercise Goals Re-Evaluation     Row Name 07/23/24 0936 08/08/24 1420 08/23/24 1829 09/04/24 1505 09/19/24 0814     Exercise Goal Re-Evaluation   Exercise Goals Review Increase Physical Activity;Able to understand and use rate of perceived exertion (RPE) scale;Knowledge and understanding of Target Heart Rate Range (THRR);Understanding of Exercise Prescription;Increase Strength and Stamina;Able to  understand and use Dyspnea scale;Able to check pulse independently Increase Physical Activity;Increase Strength and Stamina;Understanding of Exercise Prescription Increase Physical Activity;Increase Strength and Stamina;Understanding of Exercise Prescription Increase Physical Activity;Increase Strength and Stamina;Understanding of Exercise Prescription Increase Physical Activity;Increase Strength and Stamina;Understanding of Exercise Prescription  Comments Reviewed RPE and dyspnea scale, THR and program prescription with pt today.  Pt voiced understanding and was given a copy of goals to take home. Mohab is off to a great start in the program, and was able to attend his first few sessions during this review period. During his first sessions he was able to walk 17 laps on the track, and use the T4 nustep at level 3. We will continue to monitor his progress in the program. Marquail is off to a great start in the program. He was able to increase to 23 laps on the track. He also increased to level 3 on the biostep. He maintained level 3 on the T4 nustep. We will continue to monitor his progress in the program. Vane has not attended rehab since the last review. He is on vacation for the month of september, and plans to return to rehab when he gets back. We will continue to monitor his progress in the program when he returns. Fadi returned to rehab after taking a month off for vacation. He did well walking the track as he continues to reach up to 23 laps. He also improved to level 5 on the T4 nustep. We will continue to monitor his progress in the program.   Expected Outcomes Short: Use RPE daily to regulate intensity. Long: Follow program prescription in THR. Short: Continue to follow exercise prescription. Long: Continue exercise to improve strength and stamina. Short: Continue to push for more laps on the track. Long: Continue exercise to improve strength and stamina. Short: Return to rehab after vacation.  Long: Graduate. Short: Continue to progressively increase workloads. Long: Continue exercise to improve strength and stamina.    Row Name 09/26/24 1028 10/04/24 1044 10/16/24 1518         Exercise Goal Re-Evaluation   Exercise Goals Review Increase Physical Activity;Increase Strength and Stamina Increase Physical Activity;Increase Strength and Stamina;Understanding of Exercise Prescription Increase Physical Activity;Increase Strength and Stamina;Understanding of Exercise Prescription     Comments Talked with Carolton's daugter as he is not able to orally communicate. She states that he is very active at home and walks on days he does not come to class. Rishaan continues to do well in rehab. He increased his laps on the track to 27. He also increased to level 2 on the biostep. We will continue to monitor his progress in the program. Rahshawn did not attend rehab during the last review period. He informed staff that he would be out of town and would not attend rehab during this time. We will continue to monitor his progress when he returns to the program.     Expected Outcomes Short: continue to be active at home. Go over home exercise guidelines with patient. Long: maintain independent exercise routine upon graduation from cardiac rehab. Short: Continue to progressively increase laps on the track. Long: Continue exercise to improve strength and stamina. Short: Return to rehab after vacation. Long: Graduate.        Discharge Exercise Prescription (Final Exercise Prescription Changes):  Exercise Prescription Changes - 10/04/24 1000       Response to Exercise   Blood Pressure (Admit) 110/62    Blood Pressure (Exit) 102/60    Heart Rate (Admit) 76 bpm    Heart Rate (Exercise) 117 bpm    Heart Rate (Exit) 86 bpm    Rating of Perceived Exertion (Exercise) 13    Symptoms none    Duration Progress to 30 minutes of  aerobic without  signs/symptoms of physical distress    Intensity THRR unchanged       Progression   Progression Continue to progress workloads to maintain intensity without signs/symptoms of physical distress.    Average METs 2.24      Resistance Training   Training Prescription Yes    Weight 2 lb    Reps 10-15      Interval Training   Interval Training No      NuStep   Level 3    Minutes 15    METs 2.6      Biostep-RELP   Level 2    Minutes 15    METs 2      Track   Laps 27    Minutes 15    METs 2.47      Oxygen   Maintain Oxygen Saturation 88% or higher          Nutrition:  Target Goals: Understanding of nutrition guidelines, daily intake of sodium 1500mg , cholesterol 200mg , calories 30% from fat and 7% or less from saturated fats, daily to have 5 or more servings of fruits and vegetables.  Education: Nutrition 1 -Group instruction provided by verbal, written material, interactive activities, discussions, models, and posters to present general guidelines for heart healthy nutrition including macronutrients, label reading, and promoting whole foods over processed counterparts. Education serves as pensions consultant of discussion of heart healthy eating for all. Written material provided at class time.    Education: Nutrition 2 -Group instruction provided by verbal, written material, interactive activities, discussions, models, and posters to present general guidelines for heart healthy nutrition including sodium, cholesterol, and saturated fat. Providing guidance of habit forming to improve blood pressure, cholesterol, and body weight. Written material provided at class time.     Biometrics:  Pre Biometrics - 07/18/24 1156       Pre Biometrics   Height 5' 5.5 (1.664 m)    Weight 115 lb 8 oz (52.4 kg)    Waist Circumference 33 inches    Hip Circumference 36 inches    Waist to Hip Ratio 0.92 %    BMI (Calculated) 18.92    Single Leg Stand 5.07 seconds           Nutrition Therapy Plan and Nutrition Goals:   Nutrition  Assessments:  MEDIFICTS Score Key: >=70 Need to make dietary changes  40-70 Heart Healthy Diet <= 40 Therapeutic Level Cholesterol Diet  Flowsheet Row Pulmonary Rehab from 01/27/2022 in [facility] Cardiac and Pulmonary Rehab  Picture Your Plate Total Score on Admission 63  Picture Your Plate Total Score on Discharge 56   Picture Your Plate Scores: <59 Unhealthy dietary pattern with much room for improvement. 41-50 Dietary pattern unlikely to meet recommendations for good health and room for improvement. 51-60 More healthful dietary pattern, with some room for improvement.  >60 Healthy dietary pattern, although there may be some specific behaviors that could be improved.    Nutrition Goals Re-Evaluation:  Nutrition Goals Re-Evaluation     Row Name 09/26/24 1019             Goals   Comment Talked with patient's daughter as patient is unable to orally communicate. She reports that he is eating pureed foods and supplemental drinks.       Expected Outcome Short: continue to eatpureed foods to try to meat nutrition needs. Long: maintain healthy weight.          Nutrition Goals Discharge (Final Nutrition Goals Re-Evaluation):  Nutrition Goals Re-Evaluation -  09/26/24 1019       Goals   Comment Talked with patient's daughter as patient is unable to orally communicate. She reports that he is eating pureed foods and supplemental drinks.    Expected Outcome Short: continue to eatpureed foods to try to meat nutrition needs. Long: maintain healthy weight.          Psychosocial: Target Goals: Acknowledge presence or absence of significant depression and/or stress, maximize coping skills, provide positive support system. Participant is able to verbalize types and ability to use techniques and skills needed for reducing stress and depression.   Education: Stress, Anxiety, and Depression - Group verbal and visual presentation to define topics covered.  Reviews how body is impacted by stress,  anxiety, and depression.  Also discusses healthy ways to reduce stress and to treat/manage anxiety and depression. Written material provided at class time.   Education: Sleep Hygiene -Provides group verbal and written instruction about how sleep can affect your health.  Define sleep hygiene, discuss sleep cycles and impact of sleep habits. Review good sleep hygiene tips.   Initial Review & Psychosocial Screening:  Initial Psych Review & Screening - 07/11/24 1043       Initial Review   Current issues with Current Stress Concerns;Current Psychotropic Meds    Source of Stress Concerns Chronic Illness      Family Dynamics   Good Support System? Yes      Barriers   Psychosocial barriers to participate in program There are no identifiable barriers or psychosocial needs.;The patient should benefit from training in stress management and relaxation.      Screening Interventions   Interventions To provide support and resources with identified psychosocial needs;Encouraged to exercise;Provide feedback about the scores to participant    Expected Outcomes Short Term goal: Utilizing psychosocial counselor, staff and physician to assist with identification of specific Stressors or current issues interfering with healing process. Setting desired goal for each stressor or current issue identified.;Long Term Goal: Stressors or current issues are controlled or eliminated.;Short Term goal: Identification and review with participant of any Quality of Life or Depression concerns found by scoring the questionnaire.;Long Term goal: The participant improves quality of Life and PHQ9 Scores as seen by post scores and/or verbalization of changes          Quality of Life Scores:   Quality of Life - 07/18/24 1201       Quality of Life   Select Quality of Life      Quality of Life Scores   Health/Function Pre 8.25 %    Socioeconomic Pre 30 %    Psych/Spiritual Pre 22.5 %    Family Pre 30 %    GLOBAL Pre  16.4 %         Scores of 19 and below usually indicate a poorer quality of life in these areas.  A difference of  2-3 points is a clinically meaningful difference.  A difference of 2-3 points in the total score of the Quality of Life Index has been associated with significant improvement in overall quality of life, self-image, physical symptoms, and general health in studies assessing change in quality of life.  PHQ-9: Review Flowsheet       09/26/2024 07/18/2024 01/27/2022 10/27/2021  Depression screen PHQ 2/9  Decreased Interest 3 1 0 0  Down, Depressed, Hopeless 2 2 0 0  PHQ - 2 Score 5 3 0 0  Altered sleeping 3 3 2 3   Tired, decreased energy 3 2  1 1  Change in appetite 3 3 2 3   Feeling bad or failure about yourself  0 2 0 0  Trouble concentrating 3 3 0 0  Moving slowly or fidgety/restless 2 3 2  0  Suicidal thoughts 0 0 0 0  PHQ-9 Score 19  19  7  7    Difficult doing work/chores Extremely dIfficult Very difficult Not difficult at all Not difficult at all    Details       Data saved with a previous flowsheet row definition        Interpretation of Total Score  Total Score Depression Severity:  1-4 = Minimal depression, 5-9 = Mild depression, 10-14 = Moderate depression, 15-19 = Moderately severe depression, 20-27 = Severe depression   Psychosocial Evaluation and Intervention:  Psychosocial Evaluation - 07/11/24 1054       Psychosocial Evaluation & Interventions   Interventions Encouraged to exercise with the program and follow exercise prescription;Stress management education;Relaxation education    Comments Mr. Schreck is coming to cardiac rehab. 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 He recently got switched to pureed foods which he is not enjoying very much. His doctor has started him on Paxil to help with his mental health symptoms that have increased since his health decline. His daughter was on the phone orientation and states that  they are hopeful this will help with his depression symptoms and stress. He reports no sleep concerns at this time. They are hoping he can gain some stamina during the program    Expected Outcomes Short: attend cardiac rehab for education and exercise Long: develop and maintain positive self care habits    Continue Psychosocial Services  Follow up required by staff          Psychosocial Re-Evaluation:  Psychosocial Re-Evaluation     Row Name 09/26/24 1025             Psychosocial Re-Evaluation   Current issues with None Identified       Comments Re-evaluated PHQ 9 today. Patient's score remained the same at 19. He continues take his medications.       Expected Outcomes Short: contine to attend cardiac rehab for mental health benefits of exercise. Long: maintain good mental health routine.       Interventions Encouraged to attend Pulmonary Rehabilitation for the exercise       Continue Psychosocial Services  Follow up required by staff          Psychosocial Discharge (Final Psychosocial Re-Evaluation):  Psychosocial Re-Evaluation - 09/26/24 1025       Psychosocial Re-Evaluation   Current issues with None Identified    Comments Re-evaluated PHQ 9 today. Patient's score remained the same at 19. He continues take his medications.    Expected Outcomes Short: contine to attend cardiac rehab for mental health benefits of exercise. Long: maintain good mental health routine.    Interventions Encouraged to attend Pulmonary Rehabilitation for the exercise    Continue Psychosocial Services  Follow up required by staff          Vocational Rehabilitation: Provide vocational rehab assistance to qualifying candidates.   Vocational Rehab Evaluation & Intervention:  Vocational Rehab - 07/11/24 1043       Initial Vocational Rehab Evaluation & Intervention   Assessment shows need for Vocational Rehabilitation No          Education: Education Goals: Education classes will be  provided on a variety of topics geared toward better understanding  of heart health and risk factor modification. Participant will state understanding/return demonstration of topics presented as noted by education test scores.  Learning Barriers/Preferences:  Learning Barriers/Preferences - 07/11/24 1149       Learning Barriers/Preferences   Learning Barriers --   nonverbal   Learning Preferences Individual Instruction          General Cardiac Education Topics:  AED/CPR: - Group verbal and written instruction with the use of models to demonstrate the basic use of the AED with the basic ABC's of resuscitation.   Test and Procedures: - Group verbal and visual presentation and models provide information about basic cardiac anatomy and function. Reviews the testing methods done to diagnose heart disease and the outcomes of the test results. Describes the treatment choices: Medical Management, Angioplasty, or Coronary Bypass Surgery for treating various heart conditions including Myocardial Infarction, Angina, Valve Disease, and Cardiac Arrhythmias. Written material provided at class time.   Medication Safety: - Group verbal and visual instruction to review commonly prescribed medications for heart and lung disease. Reviews the medication, class of the drug, and side effects. Includes the steps to properly store meds and maintain the prescription regimen. Written material provided at class time. Flowsheet Row Pulmonary Rehab from 01/21/2022 in [facility] Cardiac and Pulmonary Rehab  Date 12/30/21  Educator SB  Instruction Review Code 1- Verbalizes Understanding    Intimacy: - Group verbal instruction through game format to discuss how heart and lung disease can affect sexual intimacy. Written material provided at class time. Flowsheet Row Cardiac Rehab from 07/18/2024 in [facility] Cardiac and Pulmonary Rehab  Education need identified 07/18/24    Know Your Numbers and Heart Failure: - Group verbal  and visual instruction to discuss disease risk factors for cardiac and pulmonary disease and treatment options.  Reviews associated critical values for Overweight/Obesity, Hypertension, Cholesterol, and Diabetes.  Discusses basics of heart failure: signs/symptoms and treatments.  Introduces Heart Failure Zone chart for action plan for heart failure. Written material provided at class time. Flowsheet Row Cardiac Rehab from 07/18/2024 in [facility] Cardiac and Pulmonary Rehab  Education need identified 07/18/24    Infection Prevention: - Provides verbal and written material to individual with discussion of infection control including proper hand washing and proper equipment cleaning during exercise session. Flowsheet Row Cardiac Rehab from 07/18/2024 in [facility] Cardiac and Pulmonary Rehab  Date 07/18/24  Educator [facility]  Instruction Review Code 1- Verbalizes Understanding    Falls Prevention: - Provides verbal and written material to individual with discussion of falls prevention and safety. Flowsheet Row Cardiac Rehab from 07/18/2024 in [facility] Cardiac and Pulmonary Rehab  Date 07/18/24  Educator [facility]  Instruction Review Code 1- Verbalizes Understanding    Other: -Provides group and verbal instruction on various topics (see comments)   Knowledge Questionnaire Score:  Knowledge Questionnaire Score - 07/18/24 1202       Knowledge Questionnaire Score   Pre Score 20/26          Core Components/Risk Factors/Patient Goals at Admission:  Personal Goals and Risk Factors at Admission - 07/18/24 1156       Core Components/Risk Factors/Patient Goals on Admission    Weight Management Yes;Weight Gain    Intervention Weight Management: Develop a combined nutrition and exercise program designed to reach desired caloric intake, while maintaining appropriate intake of nutrient and fiber, sodium and fats, and appropriate energy expenditure required for the weight goal.;Weight Management: Provide education and  appropriate resources to help participant work on and attain dietary  goals.;Weight Management/Obesity: Establish reasonable short term and long term weight goals.    Admit Weight 115 lb 8 oz (52.4 kg)    Goal Weight: Short Term 120 lb (54.4 kg)    Goal Weight: Long Term 130 lb (59 kg)    Expected Outcomes Short Term: Continue to assess and modify interventions until short term weight is achieved;Long Term: Adherence to nutrition and physical activity/exercise program aimed toward attainment of established weight goal;Understanding recommendations for meals to include 15-35% energy as protein, 25-35% energy from fat, 35-60% energy from carbohydrates, less than 200mg  of dietary cholesterol, 20-35 gm of total fiber daily;Weight Gain: Understanding of general recommendations for a high calorie, high protein meal plan that promotes weight gain by distributing calorie intake throughout the day with the consumption for 4-5 meals, snacks, and/or supplements;Understanding of distribution of calorie intake throughout the day with the consumption of 4-5 meals/snacks    Diabetes Yes    Intervention Provide education about signs/symptoms and action to take for hypo/hyperglycemia.;Provide education about proper nutrition, including hydration, and aerobic/resistive exercise prescription along with prescribed medications to achieve blood glucose in normal ranges: Fasting glucose 65-99 mg/dL    Expected Outcomes Short Term: Participant verbalizes understanding of the signs/symptoms and immediate care of hyper/hypoglycemia, proper foot care and importance of medication, aerobic/resistive exercise and nutrition plan for blood glucose control.;Long Term: Attainment of HbA1C < 7%.    Heart Failure Yes    Intervention Provide a combined exercise and nutrition program that is supplemented with education, support and counseling about heart failure. Directed toward relieving symptoms such as shortness of breath, decreased exercise  tolerance, and extremity edema.    Expected Outcomes Improve functional capacity of life;Short term: Attendance in program 2-3 days a week with increased exercise capacity. Reported lower sodium intake. Reported increased fruit and vegetable intake. Reports medication compliance.;Short term: Daily weights obtained and reported for increase. Utilizing diuretic protocols set by physician.;Long term: Adoption of self-care skills and reduction of barriers for early signs and symptoms recognition and intervention leading to self-care maintenance.    Hypertension Yes    Intervention Provide education on lifestyle modifcations including regular physical activity/exercise, weight management, moderate sodium restriction and increased consumption of fresh fruit, vegetables, and low fat dairy, alcohol moderation, and smoking cessation.;Monitor prescription use compliance.    Expected Outcomes Short Term: Continued assessment and intervention until BP is < 140/29mm HG in hypertensive participants. < 130/50mm HG in hypertensive participants with diabetes, heart failure or chronic kidney disease.;Long Term: Maintenance of blood pressure at goal levels.    Lipids Yes    Intervention Provide education and support for participant on nutrition & aerobic/resistive exercise along with prescribed medications to achieve LDL 70mg , HDL >40mg .    Expected Outcomes Short Term: Participant states understanding of desired cholesterol values and is compliant with medications prescribed. Participant is following exercise prescription and nutrition guidelines.;Long Term: Cholesterol controlled with medications as prescribed, with individualized exercise RX and with personalized nutrition plan. Value goals: LDL < 70mg , HDL > 40 mg.          Education:Diabetes - Individual verbal and written instruction to review signs/symptoms of diabetes, desired ranges of glucose level fasting, after meals and with exercise. Acknowledge that pre  and post exercise glucose checks will be done for 3 sessions at entry of program. Flowsheet Row Cardiac Rehab from 07/18/2024 in [facility] Cardiac and Pulmonary Rehab  Date 07/18/24  Educator [facility]  Instruction Review Code 1- Verbalizes Understanding    Core Components/Risk  Factors/Patient Goals Review:   Goals and Risk Factor Review     Row Name 09/26/24 1022             Core Components/Risk Factors/Patient Goals Review   Personal Goals Review Heart Failure;Hypertension;Lipids;Diabetes       Review Talked with patient's daughter as patient is unable to orally communicate. She reported that he does take all medications to manage diabetes, blood pressure, and cholesterol. He also has a scale at home he can use to monitor weight and look for signs of heart failure.       Expected Outcomes Short: check BP at home. Long: control cardiac risk factors.          Core Components/Risk Factors/Patient Goals at Discharge (Final Review):   Goals and Risk Factor Review - 09/26/24 1022       Core Components/Risk Factors/Patient Goals Review   Personal Goals Review Heart Failure;Hypertension;Lipids;Diabetes    Review Talked with patient's daughter as patient is unable to orally communicate. She reported that he does take all medications to manage diabetes, blood pressure, and cholesterol. He also has a scale at home he can use to monitor weight and look for signs of heart failure.    Expected Outcomes Short: check BP at home. Long: control cardiac risk factors.          ITP Comments:  ITP Comments     Row Name 07/11/24 1106 07/18/24 1136 07/23/24 0935 08/08/24 0920 09/05/24 1355   ITP Comments Initial phone call completed. Diagnosis can be found in [facility]. EP Orientation scheduled for Wednesday 8/6 at 10am. Completed and gym orientation for cardiac rehab. Initial ITP created and sent for review to Dr. Oneil Pinal, Medical Director. First full day of exercise!  Patient was  oriented to gym and equipment including functions, settings, policies, and procedures.  Patient's individual exercise prescription and treatment plan were reviewed.  All starting workloads were established based on the results of the 6 minute walk test done at initial orientation visit.  The plan for exercise progression was also introduced and progression will be customized based on patient's performance and goals. 30 Day review completed. Medical Director ITP review done; changes made as directed and signed approval by Medical Director. New to program. 30 Day review completed. Medical Director ITP review done; changes made as directed and signed approval by Medical Director.    Row Name 10/03/24 1006 10/31/24 0949         ITP Comments 30 Day review completed. Medical Director ITP review done; changes made as directed and signed approval by Medical Director. 30 Day review completed. Medical Director ITP review done, changes made as directed, and signed approval by Medical Director.         Comments: 30 day review ITP

## 2024-11-04 ENCOUNTER — Emergency Department
Admission: EM | Admit: 2024-11-04 | Discharge: 2024-11-04 | Disposition: A | Discharge status: Home / Self Care | Attending: Emergency Medicine | Admitting: Emergency Medicine

## 2024-11-04 ENCOUNTER — Emergency Department

## 2024-11-04 ENCOUNTER — Other Ambulatory Visit: Payer: Self-pay

## 2024-11-04 DIAGNOSIS — I482 Chronic atrial fibrillation, unspecified: Secondary | ICD-10-CM | POA: Diagnosis not present

## 2024-11-04 DIAGNOSIS — E119 Type 2 diabetes mellitus without complications: Secondary | ICD-10-CM | POA: Diagnosis not present

## 2024-11-04 DIAGNOSIS — I509 Heart failure, unspecified: Secondary | ICD-10-CM | POA: Diagnosis not present

## 2024-11-04 DIAGNOSIS — Z7901 Long term (current) use of anticoagulants: Secondary | ICD-10-CM | POA: Diagnosis not present

## 2024-11-04 DIAGNOSIS — R1031 Right lower quadrant pain: Secondary | ICD-10-CM | POA: Diagnosis present

## 2024-11-04 DIAGNOSIS — K4090 Unilateral inguinal hernia, without obstruction or gangrene, not specified as recurrent: Secondary | ICD-10-CM | POA: Insufficient documentation

## 2024-11-04 LAB — URINALYSIS, ROUTINE W REFLEX MICROSCOPIC
Bilirubin Urine: NEGATIVE
Glucose, UA: NEGATIVE mg/dL
Hgb urine dipstick: NEGATIVE
Ketones, ur: NEGATIVE mg/dL
Leukocytes,Ua: NEGATIVE
Nitrite: NEGATIVE
Protein, ur: NEGATIVE mg/dL
Specific Gravity, Urine: 1.009 (ref 1.005–1.030)
pH: 5 (ref 5.0–8.0)

## 2024-11-04 LAB — COMPREHENSIVE METABOLIC PANEL WITH GFR
ALT: 34 U/L (ref 0–44)
AST: 25 U/L (ref 15–41)
Albumin: 4.4 g/dL (ref 3.5–5.0)
Alkaline Phosphatase: 88 U/L (ref 38–126)
Anion gap: 11 (ref 5–15)
BUN: 34 mg/dL — ABNORMAL HIGH (ref 8–23)
CO2: 31 mmol/L (ref 22–32)
Calcium: 9.5 mg/dL (ref 8.9–10.3)
Chloride: 99 mmol/L (ref 98–111)
Creatinine, Ser: 1.25 mg/dL — ABNORMAL HIGH (ref 0.61–1.24)
GFR, Estimated: 54 mL/min — ABNORMAL LOW (ref >60)
Glucose, Bld: 171 mg/dL — ABNORMAL HIGH (ref 70–99)
Potassium: 4.6 mmol/L (ref 3.5–5.1)
Sodium: 141 mmol/L (ref 135–145)
Total Bilirubin: 0.6 mg/dL (ref 0.0–1.2)
Total Protein: 7 g/dL (ref 6.5–8.1)

## 2024-11-04 LAB — CBC
HCT: 37.3 % — ABNORMAL LOW (ref 39.0–52.0)
Hemoglobin: 12.2 g/dL — ABNORMAL LOW (ref 13.0–17.0)
MCH: 32.3 pg (ref 26.0–34.0)
MCHC: 32.7 g/dL (ref 30.0–36.0)
MCV: 98.7 fL (ref 80.0–100.0)
Platelets: 180 K/uL (ref 150–400)
RBC: 3.78 MIL/uL — ABNORMAL LOW (ref 4.22–5.81)
RDW: 15.3 % (ref 11.5–15.5)
WBC: 8.3 K/uL (ref 4.0–10.5)
nRBC: 0 % (ref 0.0–0.2)

## 2024-11-04 LAB — LIPASE, BLOOD: Lipase: 39 U/L (ref 11–51)

## 2024-11-04 MED ORDER — IOHEXOL 300 MG/ML  SOLN
100.0000 mL | Freq: Once | INTRAMUSCULAR | Status: AC | PRN
Start: 2024-11-04 — End: 2024-11-04
  Administered 2024-11-04: 100 mL via INTRAVENOUS

## 2024-11-04 MED ORDER — FENTANYL CITRATE (PF) 50 MCG/ML IJ SOSY
50.0000 ug | PREFILLED_SYRINGE | Freq: Once | INTRAMUSCULAR | Status: AC
  Administered 2024-11-04: 50 ug via INTRAVENOUS
  Filled 2024-11-04: qty 1

## 2024-11-04 NOTE — ED Provider Notes (Signed)
 [facility] Provider Note    Event Date/Time   First MD Initiated Contact with Patient 11/04/24 1700     (approximate)   History   Abdominal Pain   HPI  Todd Armstrong is a 89 y.o. male who presents to the ED for evaluation of Abdominal Pain   Patient with history of chronic A-fib on Eliquis , DM, HLD and CHF  Patient nonverbal at baseline and lives with his daughter.  Daughter brings him to the ED for evaluation of RLQ abdominal pain.  No associated symptoms such as emesis, stool changes, fevers or other concerns at home.   Physical Exam   Triage Vital Signs: ED Triage Vitals [11/04/24 1632]  Encounter Vitals Group     BP (!) 132/90     Girls Systolic BP Percentile      Girls Diastolic BP Percentile      Boys Systolic BP Percentile      Boys Diastolic BP Percentile      Pulse Rate 91     Resp 15     Temp 97.9 F (36.6 C)     Temp Source Oral     SpO2 100 %     Weight      Height      Head Circumference      Peak Flow      Pain Score      Pain Loc      Pain Education      Exclude from Growth Chart     Most recent vital signs: Vitals:   11/04/24 1800 11/04/24 1852  BP: 134/71 (!) 143/62  Pulse:  79  Resp:  16  Temp:    SpO2:  100%    General: Awake, no distress.  CV:  Good peripheral perfusion.  Resp:  Normal effort.  Abd:  No distention. Soft tissue mass to the deep RLQ near right inguinal canal concerning for hernia, mild localized tenderness MSK:  No deformity noted.  Neuro:  No focal deficits appreciated. Other:     ED Results / Procedures / Treatments   Labs (all labs ordered are listed, but only abnormal results are displayed) Labs Reviewed  COMPREHENSIVE METABOLIC PANEL WITH GFR - Abnormal; Notable for the following components:      Result Value   Glucose, Bld 171 (*)    BUN 34 (*)    Creatinine, Ser 1.25 (*)    GFR, Estimated 54 (*)    All other components within normal limits  CBC - Abnormal; Notable  for the following components:   RBC 3.78 (*)    Hemoglobin 12.2 (*)    HCT 37.3 (*)    All other components within normal limits  URINALYSIS, ROUTINE W REFLEX MICROSCOPIC - Abnormal; Notable for the following components:   Color, Urine YELLOW (*)    APPearance CLEAR (*)    All other components within normal limits  LIPASE, BLOOD    EKG   RADIOLOGY CT evaluated by me with right renal hernia containing bowel  Official radiology report(s): CT ABDOMEN PELVIS W CONTRAST Result Date: 11/04/2024 CLINICAL DATA:  Right lower quadrant abdominal pain. Palpable mass on exam. EXAM: CT ABDOMEN AND PELVIS WITH CONTRAST TECHNIQUE: Multidetector CT imaging of the abdomen and pelvis was performed using the standard protocol following bolus administration of intravenous contrast. RADIATION DOSE REDUCTION: This exam was performed according to the departmental dose-optimization program which includes automated exposure control, adjustment of the mA and/or kV according to patient size  and/or use of iterative reconstruction technique. CONTRAST:  OMNIPAQUE  IOHEXOL  300 MG/ML  SOLN COMPARISON:  None Available. FINDINGS: Lower chest: The visualized lung bases are clear. There is coronary vascular calcification. No intra-abdominal free air or free fluid. Hepatobiliary: The liver is unremarkable.  No biliary dilatation. Pancreas: Unremarkable. No pancreatic ductal dilatation or surrounding inflammatory changes. Spleen: Normal in size without focal abnormality. Adrenals/Urinary Tract: The right adrenal glands unremarkable. Left adrenal thickening. Mild bilateral renal atrophy. There is no hydronephrosis on either side. There is symmetric enhancement and excretion contrast by both kidneys. The visualized ureters and urinary bladder unremarkable. Stomach/Bowel: Moderate stool throughout the colon. There is no bowel obstruction or active inflammation. There is herniation of a loop of small bowel into the right groin. The  appendix is normal. Vascular/Lymphatic: Mild aortoiliac atherosclerotic disease. The IVC is unremarkable. No portal venous gas. There is no adenopathy. Reproductive: Enlarged prostate gland measuring 5 cm in transverse axial diameter. Other: None Musculoskeletal: Osteopenia with scoliosis and degenerative changes of the spine. Expansile appearance of posterior right lower thoracic ribs as seen on the prior CT of 03/11/2021. No acute osseous pathology. IMPRESSION: 1. No acute intra-abdominal or pelvic pathology. 2. Herniation of a loop of small bowel into the right groin. No bowel obstruction. Normal appendix. 3. Enlarged prostate gland. 4.  Aortic Atherosclerosis (ICD10-I70.0). Electronically Signed   By: Vanetta Chou M.D.   On: 11/04/2024 18:27    PROCEDURES and INTERVENTIONS:  Procedures  Medications  iohexol  (OMNIPAQUE ) 300 MG/ML solution 100 mL (100 mLs Intravenous Contrast Given 11/04/24 1803)  fentaNYL  (SUBLIMAZE ) injection 50 mcg (50 mcg Intravenous Given 11/04/24 1852)     IMPRESSION / MDM / ASSESSMENT AND PLAN / ED COURSE  I reviewed the triage vital signs and the nursing notes.  Differential diagnosis includes, but is not limited to, renal hernia, incarcerated hernia, SBO, appendicitis  {Patient presents with symptoms of an acute illness or injury that is potentially life-threatening.  Patient presents for evaluation of RLQ pain with signs of inguinal hernia able to be reduced at bedside and suitable for outpatient management.  No global tenderness, only localized tenderness at obvious soft tissue mass/hernia.  Normal WBC, lipase.  Creatinine at baseline, CT obtained with right side inguinal hernia that was able to be reduced at the bedside.  Follow-up with surgery and discussed ED return precautions  Clinical Course as of 11/04/24 1949  Sun Nov 04, 2024  8144 Reassessed and discussed hernia on CT.  He does not tolerate me trying to reduce it very well.  Provide fentanyl  and  will try again in a few minutes. [DS]  1916 Another attempt, cannot seem to reduce it, seems improved but not sure it is resolved.  Page general surgery [DS]  1918 Consult with Dr. Tye, he will come see [DS]  1930 Surgery is evaluated the patient and he believes hernia has been reduced and patient can go home. [DS]    Clinical Course User Index [DS] Claudene Rover, MD     FINAL CLINICAL IMPRESSION(S) / ED DIAGNOSES   Final diagnoses:  Non-recurrent unilateral inguinal hernia without obstruction or gangrene     Rx / DC Orders   ED Discharge Orders     None        Note:  This document was prepared using Dragon voice recognition software and may include unintentional dictation errors.   Claudene Rover, MD 11/04/24 [phone]

## 2024-11-04 NOTE — Consult Note (Signed)
 Subjective:   CC: Right inguinal hernia  HPI:  Todd Armstrong is a 89 y.o. male who is consulted by Claudene for evaluation of above cc.  Symptoms were first noted 1 day ago. Pain is sharp, localized to right lower quadrant associated with nothing specific, exacerbated by touch     Past Medical History:  has a past medical history of Acute myocardial infarction, Anemia, Aortic insufficiency, Carotid stenosis, Chronic combined systolic and diastolic CHF (congestive heart failure) (HCC), Coronary artery disease, Diabetes mellitus without complication (HCC), Dyslipidemia, Emphysema (subcutaneous) (surgical) resulting from a procedure, Hyperlipemia, Hypertension, Mitral regurgitation, Partial tear of rotator cuff, Peripheral vascular disease, Persistent atrial fibrillation (HCC), Pulmonary hypertension (HCC), Stroke (HCC), and Wears dentures.  Past Surgical History:  has a past surgical history that includes Hernia repair (1984); Cardiac catheterization (11/05/2008); Cardiac catheterization (10/22/1993); Fracture surgery; Multiple tooth extractions; Shoulder arthroscopy (Right, 06/09/2017); Coronary angioplasty with stent; Knee arthroscopy (Left); Carotid endarterectomy (Right, 03/23/2018); Endarterectomy (Right, 03/23/2018); Patch angioplasty (Right, 03/23/2018); and Cardioversion (N/A, 06/05/2020).  Family History: family history includes Heart attack in his father.  Social History:  reports that he quit smoking about 56 years ago. His smoking use included cigarettes. He started smoking about 64 years ago. He has a 8 pack-year smoking history. He has never used smokeless tobacco. He reports that he does not drink alcohol and does not use drugs.  Current Medications:  Prior to Admission medications   Medication Sig Start Date End Date Taking? Authorizing Provider  Accu-Chek FastClix Lancets MISC For use when checking blood sugars 10/06/21   [provider]  albuterol  (VENTOLIN  HFA) 108 (90 Base)  MCG/ACT inhaler Inhale 2 puffs into the lungs every 6 (six) hours as needed for wheezing or shortness of breath. 03/25/21   Geronimo Amel, MD  apixaban  (ELIQUIS ) 2.5 MG TABS tablet Take 2.5 mg by mouth 2 (two) times daily. Per patient's daughter Dr. Charlott switched Apixaban  (Eliquis ) from 5 mg twice a day. Patient is now taking Apixaban  (Eliquis )  2.5 mg twice a day.    [provider]  clopidogrel  (PLAVIX ) 75 MG tablet TAKE 1 TABLET BY MOUTH DAILY 07/05/23   Nahser, Aleene PARAS, MD  Dextromethorphan  HBr 15 MG/5ML LIQD Take 5 mLs (15 mg total) by mouth 3 (three) times daily as needed. 02/14/22   Billy Asberry FALCON, PA-C  metFORMIN  (GLUCOPHAGE ) 1000 MG tablet Take 1,000 mg by mouth 2 (two) times daily with a meal. Patient not taking: Reported on 07/11/2024    [provider]  metoprolol  succinate (TOPROL -XL) 25 MG 24 hr tablet Take 25 mg by mouth daily.    [provider]  metoprolol  tartrate (LOPRESSOR ) 25 MG tablet Take 0.5 tablets (12.5 mg total) by mouth 2 (two) times daily. Patient not taking: Reported on 07/11/2024 10/01/21   Cindy Garnette POUR, MD  nitroGLYCERIN  (NITROSTAT ) 0.4 MG SL tablet Place 1 tablet (0.4 mg total) under the tongue every 5 (five) minutes as needed for chest pain. 02/23/23   Nahser, Aleene PARAS, MD  PARoxetine (PAXIL) 20 MG tablet Take by mouth. 03/02/24   [provider]  potassium chloride  (KLOR-CON ) 10 MEQ tablet Take 1 tablet (10 mEq total) by mouth 2 (two) times daily. 10/01/21   Cindy Garnette POUR, MD  rosuvastatin  (CRESTOR ) 10 MG tablet TAKE 1 TABLET BY MOUTH DAILY 11/28/23   Nahser, Aleene PARAS, MD  tamsulosin  (FLOMAX ) 0.4 MG CAPS capsule Take 0.4 mg by mouth at bedtime.  04/07/20   [provider]  torsemide  (DEMADEX )  20 MG tablet Take 1/2 tablet by mouth only on Mondays, Wednesdays, and Fridays 02/23/23   Nahser, Aleene PARAS, MD  triamcinolone cream (KENALOG) 0.1 % Apply 1 application topically as needed (for itching). 07/22/20   [provider]    Allergies:  Allergies as of 11/04/2024   (No Known Allergies)    ROS:  Pertinent positives and negatives noted in HPI   Objective:     BP (!) 143/62   Pulse 79   Temp 97.9 F (36.6 C) (Oral)   Resp 16   SpO2 100%    Constitutional :  alert, cooperative, appears stated age, and no distress  Respiratory:  Clear to auscultation bilaterally  Cardiovascular:  Regular rate and rhythm  Gastrointestinal: {soft, non-tender; bowel sounds normal; no masses,  no organomegaly.  Right inguinal region with no evidence of hernia at time of exam  Skin: Cool and moist,   Psychiatric: Normal affect, non-agitated, not confused       LABS:     Latest Ref Rng & Units 11/04/2024    4:35 PM 06/12/2024    3:54 PM 03/16/2023   11:26 AM  CMP  Glucose 70 - 99 mg/dL 828  800  886   BUN 8 - 23 mg/dL 34  35  23   Creatinine 0.61 - 1.24 mg/dL 8.74  8.64  8.83   Sodium 135 - 145 mmol/L 141  141  142   Potassium 3.5 - 5.1 mmol/L 4.6  4.0  4.7   Chloride 98 - 111 mmol/L 99  96  104   CO2 22 - 32 mmol/L 31  30  23    Calcium  8.9 - 10.3 mg/dL 9.5  9.4  9.8   Total Protein 6.5 - 8.1 g/dL 7.0  6.8    Total Bilirubin 0.0 - 1.2 mg/dL 0.6  1.4    Alkaline Phos 38 - 126 U/L 88  125    AST 15 - 41 U/L 25  27    ALT 0 - 44 U/L 34  23        Latest Ref Rng & Units 11/04/2024    4:35 PM 06/12/2024    3:54 PM 02/17/2023    9:13 AM  CBC  WBC 4.0 - 10.5 K/uL 8.3  10.2  5.3   Hemoglobin 13.0 - 17.0 g/dL 87.7  88.4  89.1   Hematocrit 39.0 - 52.0 % 37.3  34.5  34.3   Platelets 150 - 400 K/uL 180  269  155      RADS: CLINICAL DATA:  Right lower quadrant abdominal pain. Palpable mass on exam.   EXAM: CT ABDOMEN AND PELVIS WITH CONTRAST   TECHNIQUE: Multidetector CT imaging of the abdomen and pelvis was performed using the standard protocol following bolus administration of intravenous contrast.   RADIATION DOSE REDUCTION: This exam was performed according to the departmental  dose-optimization program which includes automated exposure control, adjustment of the mA and/or kV according to patient size and/or use of iterative reconstruction technique.   CONTRAST:  OMNIPAQUE  IOHEXOL  300 MG/ML  SOLN   COMPARISON:  None Available.   FINDINGS: Lower chest: The visualized lung bases are clear. There is coronary vascular calcification.   No intra-abdominal free air or free fluid.   Hepatobiliary: The liver is unremarkable.  No biliary dilatation.   Pancreas: Unremarkable. No pancreatic ductal dilatation or surrounding inflammatory changes.   Spleen: Normal in size without focal abnormality.   Adrenals/Urinary Tract: The right adrenal glands unremarkable.  Left adrenal thickening. Mild bilateral renal atrophy. There is no hydronephrosis on either side. There is symmetric enhancement and excretion contrast by both kidneys. The visualized ureters and urinary bladder unremarkable.   Stomach/Bowel: Moderate stool throughout the colon. There is no bowel obstruction or active inflammation. There is herniation of a loop of small bowel into the right groin. The appendix is normal.   Vascular/Lymphatic: Mild aortoiliac atherosclerotic disease. The IVC is unremarkable. No portal venous gas. There is no adenopathy.   Reproductive: Enlarged prostate gland measuring 5 cm in transverse axial diameter.   Other: None   Musculoskeletal: Osteopenia with scoliosis and degenerative changes of the spine. Expansile appearance of posterior right lower thoracic ribs as seen on the prior CT of 03/11/2021. No acute osseous pathology.   IMPRESSION: 1. No acute intra-abdominal or pelvic pathology. 2. Herniation of a loop of small bowel into the right groin. No bowel obstruction. Normal appendix. 3. Enlarged prostate gland. 4.  Aortic Atherosclerosis (ICD10-I70.0).     Electronically Signed   By: Vanetta Chou M.D.   On: 11/04/2024 18:27CLINICAL DATA:  Right lower  quadrant abdominal pain. Palpable mass on exam.   EXAM: CT ABDOMEN AND PELVIS WITH CONTRAST   TECHNIQUE: Multidetector CT imaging of the abdomen and pelvis was performed using the standard protocol following bolus administration of intravenous contrast.   RADIATION DOSE REDUCTION: This exam was performed according to the departmental dose-optimization program which includes automated exposure control, adjustment of the mA and/or kV according to patient size and/or use of iterative reconstruction technique.   CONTRAST:  OMNIPAQUE  IOHEXOL  300 MG/ML  SOLN   COMPARISON:  None Available.   FINDINGS: Lower chest: The visualized lung bases are clear. There is coronary vascular calcification.   No intra-abdominal free air or free fluid.   Hepatobiliary: The liver is unremarkable.  No biliary dilatation.   Pancreas: Unremarkable. No pancreatic ductal dilatation or surrounding inflammatory changes.   Spleen: Normal in size without focal abnormality.   Adrenals/Urinary Tract: The right adrenal glands unremarkable. Left adrenal thickening. Mild bilateral renal atrophy. There is no hydronephrosis on either side. There is symmetric enhancement and excretion contrast by both kidneys. The visualized ureters and urinary bladder unremarkable.   Stomach/Bowel: Moderate stool throughout the colon. There is no bowel obstruction or active inflammation. There is herniation of a loop of small bowel into the right groin. The appendix is normal.   Vascular/Lymphatic: Mild aortoiliac atherosclerotic disease. The IVC is unremarkable. No portal venous gas. There is no adenopathy.   Reproductive: Enlarged prostate gland measuring 5 cm in transverse axial diameter.   Other: None   Musculoskeletal: Osteopenia with scoliosis and degenerative changes of the spine. Expansile appearance of posterior right lower thoracic ribs as seen on the prior CT of 03/11/2021. No acute osseous pathology.    IMPRESSION: 1. No acute intra-abdominal or pelvic pathology. 2. Herniation of a loop of small bowel into the right groin. No bowel obstruction. Normal appendix. 3. Enlarged prostate gland. 4.  Aortic Atherosclerosis (ICD10-I70.0).     Electronically Signed   By: Vanetta Chou M.D.   On: 11/04/2024 18:27  Assessment:     Right lower quadrant pain at site of former right inguinal hernia, reduced at bedside by ED provider  Plan:   Patient currently taking Eliquis  so pursuing urgent repair during this admission will be greater risk than benefit especially now that the hernia has been reduced.  He was not reporting any signs of obstruction and CT imaging  consistent with no injury to hernia contents.  Pain has resolved as well.  Recommended discussion of possible elective repair as an outpatient basis.  Patient and daughter verbalized understanding is agreeable to plan  The patient verbalized understanding and all questions were answered to the patient's satisfaction.  labs/images/medications/previous chart entries reviewed personally and relevant changes/updates noted above.

## 2024-11-04 NOTE — ED Triage Notes (Signed)
 Pt is non-verbal, accompanied by daughter. She states he let her know an hour ago the right side of his abdomen is hurting. No n/v/d. Pt's last meal was 1300. Pt abdominal surgical hx includes hernia repair. Pt points to RLQ when asked where he hurts.

## 2024-11-05 ENCOUNTER — Ambulatory Visit

## 2024-11-05 ENCOUNTER — Ambulatory Visit: Payer: Self-pay | Admitting: Surgery

## 2024-11-05 NOTE — H&P (View-Only) (Signed)
 Subjective:   CC: Non-recurrent unilateral inguinal hernia without obstruction or gangrene [K40.90]  HPI: Returns for evaluation of above. Hernia noted be out again this am.   History of Present Illness    Past Medical History: includes afib, COPD, HF, CKD, T2DM  Past Surgical History: umblical hernia repair  Family History: family history is not on file.  Social History:  has no history on file for tobacco use, alcohol use, and drug use.  Current Medications: has a current medication list which includes the following prescription(s): apixaban , clopidogrel , metformin , nitroglycerin , paroxetine, potassium chloride , tamsulosin , torsemide , and triamcinolone.  Allergies:  Allergies as of 11/05/2024  . (No Known Allergies)    ROS:  A 15 point review of systems was performed and pertinent positives and negatives noted in HPI   Objective:     Ht 170.2 cm (5' 7)   Wt 53.5 kg (118 lb)   BMI 18.48 kg/m   Constitutional :  Alert, cooperative, no distress  Lymphatics/Throat:  Supple, no lymphadenopathy  Respiratory:  clear to auscultation bilaterally  Cardiovascular:  regular rate and rhythm  Gastrointestinal: soft, non-tender; bowel sounds normal; no masses,  no organomegaly. inguinal hernia noted.  small, reducible, no overlying skin changes, and RIGHT  Musculoskeletal: Steady gait and movement  Skin: Cool and moist  Psychiatric: Normal affect, non-agitated, not confused       LABS:  N/a   RADS: CLINICAL DATA:  Right lower quadrant abdominal pain. Palpable mass  on exam.   EXAM:  CT ABDOMEN AND PELVIS WITH CONTRAST   TECHNIQUE:  Multidetector CT imaging of the abdomen and pelvis was performed  using the standard protocol following bolus administration of  intravenous contrast.   RADIATION DOSE REDUCTION: This exam was performed according to the  departmental dose-optimization program which includes automated  exposure control, adjustment of the mA and/or kV  according to  patient size and/or use of iterative reconstruction technique.   CONTRAST:  OMNIPAQUE  IOHEXOL  300 MG/ML  SOLN   COMPARISON:  None Available.   FINDINGS:  Lower chest: The visualized lung bases are clear. There is coronary  vascular calcification.   No intra-abdominal free air or free fluid.   Hepatobiliary: The liver is unremarkable.  No biliary dilatation.   Pancreas: Unremarkable. No pancreatic ductal dilatation or  surrounding inflammatory changes.   Spleen: Normal in size without focal abnormality.   Adrenals/Urinary Tract: The right adrenal glands unremarkable. Left  adrenal thickening. Mild bilateral renal atrophy. There is no  hydronephrosis on either side. There is symmetric enhancement and  excretion contrast by both kidneys. The visualized ureters and  urinary bladder unremarkable.   Stomach/Bowel: Moderate stool throughout the colon. There is no  bowel obstruction or active inflammation. There is herniation of a  loop of small bowel into the right groin. The appendix is normal.   Vascular/Lymphatic: Mild aortoiliac atherosclerotic disease. The IVC  is unremarkable. No portal venous gas. There is no adenopathy.   Reproductive: Enlarged prostate gland measuring 5 cm in transverse  axial diameter.   Other: None   Musculoskeletal: Osteopenia with scoliosis and degenerative changes  of the spine. Expansile appearance of posterior right lower thoracic  ribs as seen on the prior CT of 03/11/2021. No acute osseous  pathology.   IMPRESSION:  1. No acute intra-abdominal or pelvic pathology.  2. Herniation of a loop of small bowel into the right groin. No  bowel obstruction. Normal appendix.  3. Enlarged prostate gland.  4.  Aortic Atherosclerosis (  ICD10-I70.0).    Electronically Signed    By: Vanetta Chou M.D.    On: 11/04/2024 18:27  Assessment:       Non-recurrent unilateral inguinal hernia without obstruction or gangrene [K40.90],  right.  Will proceed with repair due to recurrent nature involving loops of bowel causing anxiety.  Plan:     1. Non-recurrent unilateral inguinal hernia without obstruction or gangrene [K40.90]   Discussed the risk of surgery including recurrence, which can be up to 50% in the case of incisional or complex hernias, possible use of prosthetic materials (mesh) and the increased risk of mesh infxn if used, bleeding, chronic pain, post-op infxn, post-op SBO or ileus, and possible re-operation to address said risks. The risks of general anesthetic, if used, includes MI, CVA, sudden death or even reaction to anesthetic medications also discussed. Alternatives include continued observation.  Benefits include possible symptom relief, prevention of incarceration, strangulation, enlargement in size over time, and the risk of emergency surgery in the face of strangulation.   Typical post-op recovery time of 3-5 days with 2 weeks of activity restrictions were also discussed.  ED return precautions given for sudden increase in pain, size of hernia with accompanying fever, nausea, and/or vomiting.  The patient verbalized understanding and all questions were answered to the patient's satisfaction.   2. Patient has elected to proceed with surgical treatment. Procedure will be scheduled.   RIGHT, Non-recurrent unilateral inguinal hernia without obstruction or gangrene [K40.90], open to minimize anesthesia stress, also reports hx of umbilical hernia repair  labs/images/medications/previous chart entries reviewed personally and relevant changes/updates noted above.

## 2024-11-05 NOTE — Progress Notes (Signed)
 Subjective:   CC: Non-recurrent unilateral inguinal hernia without obstruction or gangrene [K40.90]  HPI: Returns for evaluation of above. Hernia noted be out again this am.   History of Present Illness    Past Medical History: includes afib, COPD, HF, CKD, T2DM  Past Surgical History: umblical hernia repair  Family History: family history is not on file.  Social History:  has no history on file for tobacco use, alcohol use, and drug use.  Current Medications: has a current medication list which includes the following prescription(s): apixaban , clopidogrel , metformin , nitroglycerin , paroxetine, potassium chloride , tamsulosin , torsemide , and triamcinolone.  Allergies:  Allergies as of 11/05/2024  . (No Known Allergies)    ROS:  A 15 point review of systems was performed and pertinent positives and negatives noted in HPI   Objective:     Ht 170.2 cm (5' 7)   Wt 53.5 kg (118 lb)   BMI 18.48 kg/m   Constitutional :  Alert, cooperative, no distress  Lymphatics/Throat:  Supple, no lymphadenopathy  Respiratory:  clear to auscultation bilaterally  Cardiovascular:  regular rate and rhythm  Gastrointestinal: soft, non-tender; bowel sounds normal; no masses,  no organomegaly. inguinal hernia noted.  small, reducible, no overlying skin changes, and RIGHT  Musculoskeletal: Steady gait and movement  Skin: Cool and moist  Psychiatric: Normal affect, non-agitated, not confused       LABS:  N/a   RADS: CLINICAL DATA:  Right lower quadrant abdominal pain. Palpable mass  on exam.   EXAM:  CT ABDOMEN AND PELVIS WITH CONTRAST   TECHNIQUE:  Multidetector CT imaging of the abdomen and pelvis was performed  using the standard protocol following bolus administration of  intravenous contrast.   RADIATION DOSE REDUCTION: This exam was performed according to the  departmental dose-optimization program which includes automated  exposure control, adjustment of the mA and/or kV  according to  patient size and/or use of iterative reconstruction technique.   CONTRAST:  OMNIPAQUE  IOHEXOL  300 MG/ML  SOLN   COMPARISON:  None Available.   FINDINGS:  Lower chest: The visualized lung bases are clear. There is coronary  vascular calcification.   No intra-abdominal free air or free fluid.   Hepatobiliary: The liver is unremarkable.  No biliary dilatation.   Pancreas: Unremarkable. No pancreatic ductal dilatation or  surrounding inflammatory changes.   Spleen: Normal in size without focal abnormality.   Adrenals/Urinary Tract: The right adrenal glands unremarkable. Left  adrenal thickening. Mild bilateral renal atrophy. There is no  hydronephrosis on either side. There is symmetric enhancement and  excretion contrast by both kidneys. The visualized ureters and  urinary bladder unremarkable.   Stomach/Bowel: Moderate stool throughout the colon. There is no  bowel obstruction or active inflammation. There is herniation of a  loop of small bowel into the right groin. The appendix is normal.   Vascular/Lymphatic: Mild aortoiliac atherosclerotic disease. The IVC  is unremarkable. No portal venous gas. There is no adenopathy.   Reproductive: Enlarged prostate gland measuring 5 cm in transverse  axial diameter.   Other: None   Musculoskeletal: Osteopenia with scoliosis and degenerative changes  of the spine. Expansile appearance of posterior right lower thoracic  ribs as seen on the prior CT of 03/11/2021. No acute osseous  pathology.   IMPRESSION:  1. No acute intra-abdominal or pelvic pathology.  2. Herniation of a loop of small bowel into the right groin. No  bowel obstruction. Normal appendix.  3. Enlarged prostate gland.  4.  Aortic Atherosclerosis (  ICD10-I70.0).    Electronically Signed    By: Vanetta Chou M.D.    On: 11/04/2024 18:27  Assessment:       Non-recurrent unilateral inguinal hernia without obstruction or gangrene [K40.90],  right.  Will proceed with repair due to recurrent nature involving loops of bowel causing anxiety.  Plan:     1. Non-recurrent unilateral inguinal hernia without obstruction or gangrene [K40.90]   Discussed the risk of surgery including recurrence, which can be up to 50% in the case of incisional or complex hernias, possible use of prosthetic materials (mesh) and the increased risk of mesh infxn if used, bleeding, chronic pain, post-op infxn, post-op SBO or ileus, and possible re-operation to address said risks. The risks of general anesthetic, if used, includes MI, CVA, sudden death or even reaction to anesthetic medications also discussed. Alternatives include continued observation.  Benefits include possible symptom relief, prevention of incarceration, strangulation, enlargement in size over time, and the risk of emergency surgery in the face of strangulation.   Typical post-op recovery time of 3-5 days with 2 weeks of activity restrictions were also discussed.  ED return precautions given for sudden increase in pain, size of hernia with accompanying fever, nausea, and/or vomiting.  The patient verbalized understanding and all questions were answered to the patient's satisfaction.   2. Patient has elected to proceed with surgical treatment. Procedure will be scheduled.   RIGHT, Non-recurrent unilateral inguinal hernia without obstruction or gangrene [K40.90], open to minimize anesthesia stress, also reports hx of umbilical hernia repair  labs/images/medications/previous chart entries reviewed personally and relevant changes/updates noted above.

## 2024-11-05 NOTE — H&P (Signed)
 Subjective:   CC: Non-recurrent unilateral inguinal hernia without obstruction or gangrene [K40.90]  HPI: Returns for evaluation of above. Hernia noted be out again this am.   History of Present Illness    Past Medical History: includes afib, COPD, HF, CKD, T2DM  Past Surgical History: umblical hernia repair  Family History: family history is not on file.  Social History:  has no history on file for tobacco use, alcohol use, and drug use.  Current Medications: has a current medication list which includes the following prescription(s): apixaban , clopidogrel , metformin , nitroglycerin , paroxetine, potassium chloride , tamsulosin , torsemide , and triamcinolone.  Allergies:  Allergies as of 11/05/2024  . (No Known Allergies)    ROS:  A 15 point review of systems was performed and pertinent positives and negatives noted in HPI   Objective:     Ht 170.2 cm (5' 7)   Wt 53.5 kg (118 lb)   BMI 18.48 kg/m   Constitutional :  Alert, cooperative, no distress  Lymphatics/Throat:  Supple, no lymphadenopathy  Respiratory:  clear to auscultation bilaterally  Cardiovascular:  regular rate and rhythm  Gastrointestinal: soft, non-tender; bowel sounds normal; no masses,  no organomegaly. inguinal hernia noted.  small, reducible, no overlying skin changes, and RIGHT  Musculoskeletal: Steady gait and movement  Skin: Cool and moist  Psychiatric: Normal affect, non-agitated, not confused       LABS:  N/a   RADS: CLINICAL DATA:  Right lower quadrant abdominal pain. Palpable mass  on exam.   EXAM:  CT ABDOMEN AND PELVIS WITH CONTRAST   TECHNIQUE:  Multidetector CT imaging of the abdomen and pelvis was performed  using the standard protocol following bolus administration of  intravenous contrast.   RADIATION DOSE REDUCTION: This exam was performed according to the  departmental dose-optimization program which includes automated  exposure control, adjustment of the mA and/or kV  according to  patient size and/or use of iterative reconstruction technique.   CONTRAST:  OMNIPAQUE  IOHEXOL  300 MG/ML  SOLN   COMPARISON:  None Available.   FINDINGS:  Lower chest: The visualized lung bases are clear. There is coronary  vascular calcification.   No intra-abdominal free air or free fluid.   Hepatobiliary: The liver is unremarkable.  No biliary dilatation.   Pancreas: Unremarkable. No pancreatic ductal dilatation or  surrounding inflammatory changes.   Spleen: Normal in size without focal abnormality.   Adrenals/Urinary Tract: The right adrenal glands unremarkable. Left  adrenal thickening. Mild bilateral renal atrophy. There is no  hydronephrosis on either side. There is symmetric enhancement and  excretion contrast by both kidneys. The visualized ureters and  urinary bladder unremarkable.   Stomach/Bowel: Moderate stool throughout the colon. There is no  bowel obstruction or active inflammation. There is herniation of a  loop of small bowel into the right groin. The appendix is normal.   Vascular/Lymphatic: Mild aortoiliac atherosclerotic disease. The IVC  is unremarkable. No portal venous gas. There is no adenopathy.   Reproductive: Enlarged prostate gland measuring 5 cm in transverse  axial diameter.   Other: None   Musculoskeletal: Osteopenia with scoliosis and degenerative changes  of the spine. Expansile appearance of posterior right lower thoracic  ribs as seen on the prior CT of 03/11/2021. No acute osseous  pathology.   IMPRESSION:  1. No acute intra-abdominal or pelvic pathology.  2. Herniation of a loop of small bowel into the right groin. No  bowel obstruction. Normal appendix.  3. Enlarged prostate gland.  4.  Aortic Atherosclerosis (  ICD10-I70.0).    Electronically Signed    By: Vanetta Chou M.D.    On: 11/04/2024 18:27  Assessment:       Non-recurrent unilateral inguinal hernia without obstruction or gangrene [K40.90],  right.  Will proceed with repair due to recurrent nature involving loops of bowel causing anxiety.  Plan:     1. Non-recurrent unilateral inguinal hernia without obstruction or gangrene [K40.90]   Discussed the risk of surgery including recurrence, which can be up to 50% in the case of incisional or complex hernias, possible use of prosthetic materials (mesh) and the increased risk of mesh infxn if used, bleeding, chronic pain, post-op infxn, post-op SBO or ileus, and possible re-operation to address said risks. The risks of general anesthetic, if used, includes MI, CVA, sudden death or even reaction to anesthetic medications also discussed. Alternatives include continued observation.  Benefits include possible symptom relief, prevention of incarceration, strangulation, enlargement in size over time, and the risk of emergency surgery in the face of strangulation.   Typical post-op recovery time of 3-5 days with 2 weeks of activity restrictions were also discussed.  ED return precautions given for sudden increase in pain, size of hernia with accompanying fever, nausea, and/or vomiting.  The patient verbalized understanding and all questions were answered to the patient's satisfaction.   2. Patient has elected to proceed with surgical treatment. Procedure will be scheduled.   RIGHT, Non-recurrent unilateral inguinal hernia without obstruction or gangrene [K40.90], open to minimize anesthesia stress, also reports hx of umbilical hernia repair  labs/images/medications/previous chart entries reviewed personally and relevant changes/updates noted above.

## 2024-11-06 ENCOUNTER — Encounter: Payer: Self-pay | Admitting: Urgent Care

## 2024-11-07 ENCOUNTER — Ambulatory Visit

## 2024-11-12 ENCOUNTER — Ambulatory Visit

## 2024-11-12 ENCOUNTER — Inpatient Hospital Stay: Admission: RE | Admit: 2024-11-12 | Source: Ambulatory Visit

## 2024-11-12 MED ORDER — ORAL CARE MOUTH RINSE: 15.0000 mL | Freq: Once | OROMUCOSAL | Status: AC

## 2024-11-12 MED ORDER — CEFAZOLIN SODIUM-DEXTROSE 2-4 GM/100ML-% IV SOLN
2.0000 g | INTRAVENOUS | Status: AC
  Administered 2024-11-13: 2 g via INTRAVENOUS

## 2024-11-12 MED ORDER — ACETAMINOPHEN 500 MG PO TABS
1000.0000 mg | ORAL_TABLET | ORAL | Status: AC
  Administered 2024-11-13: 1000 mg via ORAL

## 2024-11-12 MED ORDER — CHLORHEXIDINE GLUCONATE 0.12 % MT SOLN
15.0000 mL | Freq: Once | OROMUCOSAL | Status: AC
  Administered 2024-11-13: 15 mL via OROMUCOSAL

## 2024-11-12 MED ORDER — CHLORHEXIDINE GLUCONATE CLOTH 2 % EX PADS: 6.0000 | MEDICATED_PAD | Freq: Once | CUTANEOUS | Status: DC

## 2024-11-12 MED ORDER — CELECOXIB 200 MG PO CAPS
200.0000 mg | ORAL_CAPSULE | ORAL | Status: AC
  Administered 2024-11-13: 200 mg via ORAL

## 2024-11-12 MED ORDER — SODIUM CHLORIDE 0.9 % IV SOLN: INTRAVENOUS | Status: DC

## 2024-11-13 ENCOUNTER — Encounter: Admission: RE | Payer: Self-pay | Source: Home / Self Care

## 2024-11-13 ENCOUNTER — Ambulatory Visit: Admitting: General Practice

## 2024-11-13 ENCOUNTER — Ambulatory Visit: Admission: RE | Admit: 2024-11-13 | Source: Home / Self Care | Admitting: Surgery

## 2024-11-13 ENCOUNTER — Other Ambulatory Visit: Payer: Self-pay

## 2024-11-13 ENCOUNTER — Encounter
Admission: RE | Disposition: A | Discharge status: Home / Self Care | Payer: Self-pay | Source: Home / Self Care | Attending: Surgery

## 2024-11-13 ENCOUNTER — Encounter: Payer: Self-pay | Admitting: Surgery

## 2024-11-13 ENCOUNTER — Ambulatory Visit: Admission: RE | Admit: 2024-11-13 | Discharge: 2024-11-13 | Disposition: A | Attending: Surgery | Admitting: Surgery

## 2024-11-13 DIAGNOSIS — E1169 Type 2 diabetes mellitus with other specified complication: Secondary | ICD-10-CM

## 2024-11-13 DIAGNOSIS — K4090 Unilateral inguinal hernia, without obstruction or gangrene, not specified as recurrent: Secondary | ICD-10-CM

## 2024-11-13 HISTORY — PX: INGUINAL HERNIA REPAIR: SHX194

## 2024-11-13 LAB — GLUCOSE, CAPILLARY
Glucose-Capillary: 133 mg/dL — ABNORMAL HIGH (ref 70–99)
Glucose-Capillary: 160 mg/dL — ABNORMAL HIGH (ref 70–99)

## 2024-11-13 SURGERY — REPAIR, HERNIA, INGUINAL, ADULT
Anesthesia: General | Site: Groin | Laterality: Right

## 2024-11-13 MED ORDER — LIDOCAINE HCL (CARDIAC) PF 100 MG/5ML IV SOSY
PREFILLED_SYRINGE | INTRAVENOUS | Status: DC | PRN
  Administered 2024-11-13: 60 mg via INTRAVENOUS

## 2024-11-13 MED ORDER — SUGAMMADEX SODIUM 200 MG/2ML IV SOLN
INTRAVENOUS | Status: DC | PRN
  Administered 2024-11-13: 100 mg via INTRAVENOUS

## 2024-11-13 MED ORDER — ROCURONIUM BROMIDE 100 MG/10ML IV SOLN
INTRAVENOUS | Status: DC | PRN
  Administered 2024-11-13: 30 mg via INTRAVENOUS
  Administered 2024-11-13: 10 mg via INTRAVENOUS

## 2024-11-13 MED ORDER — BUPIVACAINE-EPINEPHRINE (PF) 0.5% -1:200000 IJ SOLN
INTRAMUSCULAR | Status: AC
  Filled 2024-11-13: qty 30

## 2024-11-13 MED ORDER — 0.9 % SODIUM CHLORIDE (POUR BTL) OPTIME
TOPICAL | Status: DC | PRN
  Administered 2024-11-13: 500 mL

## 2024-11-13 MED ORDER — OXYCODONE HCL 5 MG PO TABS
5.0000 mg | ORAL_TABLET | Freq: Three times a day (TID) | ORAL | 0 refills | Status: AC | PRN
  Filled 2024-11-13: qty 6, 2d supply, fill #0

## 2024-11-13 MED ORDER — ONDANSETRON HCL 4 MG/2ML IJ SOLN
INTRAMUSCULAR | Status: DC | PRN
  Administered 2024-11-13: 4 mg via INTRAVENOUS

## 2024-11-13 MED ORDER — BUPIVACAINE-EPINEPHRINE (PF) 0.5% -1:200000 IJ SOLN
INTRAMUSCULAR | Status: DC | PRN
  Administered 2024-11-13: 5 mL

## 2024-11-13 MED ORDER — DOCUSATE SODIUM 100 MG PO CAPS
100.0000 mg | ORAL_CAPSULE | Freq: Two times a day (BID) | ORAL | 0 refills | Status: AC | PRN
  Filled 2024-11-13: qty 20, 10d supply, fill #0

## 2024-11-13 MED ORDER — OXYCODONE HCL 5 MG PO TABS: 5.0000 mg | ORAL_TABLET | Freq: Once | ORAL | Status: DC | PRN

## 2024-11-13 MED ORDER — FENTANYL CITRATE (PF) 100 MCG/2ML IJ SOLN: 25.0000 ug | INTRAMUSCULAR | Status: DC | PRN

## 2024-11-13 MED ORDER — ACETAMINOPHEN 500 MG PO TABS
ORAL_TABLET | ORAL | Status: AC
  Filled 2024-11-13: qty 2

## 2024-11-13 MED ORDER — CHLORHEXIDINE GLUCONATE 0.12 % MT SOLN
OROMUCOSAL | Status: AC
  Filled 2024-11-13: qty 15

## 2024-11-13 MED ORDER — OXYCODONE HCL 5 MG/5ML PO SOLN: 5.0000 mg | Freq: Once | ORAL | Status: DC | PRN

## 2024-11-13 MED ORDER — FENTANYL CITRATE (PF) 100 MCG/2ML IJ SOLN
INTRAMUSCULAR | Status: AC
  Filled 2024-11-13: qty 2

## 2024-11-13 MED ORDER — BUPIVACAINE LIPOSOME 1.3 % IJ SUSP
INTRAMUSCULAR | Status: DC | PRN
  Administered 2024-11-13: 20 mL

## 2024-11-13 MED ORDER — ACETAMINOPHEN 325 MG PO TABS
650.0000 mg | ORAL_TABLET | Freq: Three times a day (TID) | ORAL | 0 refills | Status: AC | PRN
  Filled 2024-11-13: qty 40, 7d supply, fill #0

## 2024-11-13 MED ORDER — EPHEDRINE SULFATE-NACL 50-0.9 MG/10ML-% IV SOSY
PREFILLED_SYRINGE | INTRAVENOUS | Status: DC | PRN
  Administered 2024-11-13: 10 mg via INTRAVENOUS
  Administered 2024-11-13: 5 mg via INTRAVENOUS

## 2024-11-13 MED ORDER — PROPOFOL 10 MG/ML IV BOLUS
INTRAVENOUS | Status: AC
  Filled 2024-11-13: qty 20

## 2024-11-13 MED ORDER — PROPOFOL 10 MG/ML IV BOLUS
INTRAVENOUS | Status: DC | PRN
  Administered 2024-11-13: 60 mg via INTRAVENOUS

## 2024-11-13 MED ORDER — CELECOXIB 200 MG PO CAPS
ORAL_CAPSULE | ORAL | Status: AC
  Filled 2024-11-13: qty 1

## 2024-11-13 MED ORDER — FENTANYL CITRATE (PF) 100 MCG/2ML IJ SOLN
INTRAMUSCULAR | Status: DC | PRN
  Administered 2024-11-13: 50 ug via INTRAVENOUS

## 2024-11-13 MED ORDER — CEFAZOLIN SODIUM-DEXTROSE 2-4 GM/100ML-% IV SOLN
INTRAVENOUS | Status: AC
  Filled 2024-11-13: qty 100

## 2024-11-13 MED ORDER — BUPIVACAINE LIPOSOME 1.3 % IJ SUSP
INTRAMUSCULAR | Status: AC
  Filled 2024-11-13: qty 20

## 2024-11-13 MED ORDER — PHENYLEPHRINE 80 MCG/ML (10ML) SYRINGE FOR IV PUSH (FOR BLOOD PRESSURE SUPPORT)
PREFILLED_SYRINGE | INTRAVENOUS | Status: DC | PRN
  Administered 2024-11-13 (×3): 80 ug via INTRAVENOUS

## 2024-11-13 SURGICAL SUPPLY — 31 items
BLADE CLIPPER SURG (BLADE) IMPLANT
BLADE SURG 15 STRL LF DISP TIS (BLADE) ×2 IMPLANT
CHLORAPREP W/TINT 26 (MISCELLANEOUS) IMPLANT
DERMABOND ADVANCED .7 DNX12 (GAUZE/BANDAGES/DRESSINGS) ×2 IMPLANT
DRAIN PENROSE 12X.25 LTX STRL (MISCELLANEOUS) ×2 IMPLANT
DRAPE LAPAROTOMY 100X77 ABD (DRAPES) ×2 IMPLANT
ELECT CAUTERY BLADE 6.4 (BLADE) IMPLANT
ELECTRODE REM PT RTRN 9FT ADLT (ELECTROSURGICAL) ×2 IMPLANT
GLOVE BIOGEL PI IND STRL 7.0 (GLOVE) ×2 IMPLANT
GLOVE SURG SYN 6.5 PF PI (GLOVE) ×6 IMPLANT
GOWN STRL REUS W/ TWL LRG LVL3 (GOWN DISPOSABLE) ×6 IMPLANT
LABEL OR SOLS (LABEL) ×2 IMPLANT
MANIFOLD NEPTUNE II (INSTRUMENTS) ×2 IMPLANT
MESH PARIETEX PROGRIP RIGHT (Mesh General) IMPLANT
NDL HYPO 22X1.5 SAFETY MO (MISCELLANEOUS) ×2 IMPLANT
NS IRRIG 500ML POUR BTL (IV SOLUTION) ×2 IMPLANT
PACK BASIN MINOR ARMC (MISCELLANEOUS) ×2 IMPLANT
RETRACTOR WOUND ALXS 18CM SML (MISCELLANEOUS) IMPLANT
SOLN STERILE WATER 500 ML (IV SOLUTION) ×2 IMPLANT
SOLN STERILE WATER BTL 1000 ML (IV SOLUTION) ×2 IMPLANT
SUT ETHIBOND NAB MO 7 #0 18IN (SUTURE) ×2 IMPLANT
SUT SILK 3 0 12 30 (SUTURE) IMPLANT
SUT SILK 3 0 SH 30 (SUTURE) IMPLANT
SUT VIC AB 2-0 CT2 27 (SUTURE) ×2 IMPLANT
SUT VIC AB 3-0 SH 27X BRD (SUTURE) ×2 IMPLANT
SUTURE MNCRL 4-0 27XMF (SUTURE) ×2 IMPLANT
SYR 10ML LL (SYRINGE) ×2 IMPLANT
SYR 20ML LL LF (SYRINGE) ×2 IMPLANT
SYR BULB IRRIG 60ML STRL (SYRINGE) ×2 IMPLANT
TOWEL OR 17X26 4PK STRL BLUE (TOWEL DISPOSABLE) IMPLANT
TRAP FLUID SMOKE EVACUATOR (MISCELLANEOUS) ×2 IMPLANT

## 2024-11-13 NOTE — Anesthesia Procedure Notes (Signed)
 Procedure Name: Intubation Date/Time: 11/13/2024 7:42 AM  Performed by: Veronica Alm BROCKS, CRNAPre-anesthesia Checklist: Patient identified, Patient being monitored, Timeout performed, Emergency Drugs available and Suction available Patient Re-evaluated:Patient Re-evaluated prior to induction Oxygen Delivery Method: Circle system utilized Preoxygenation: Pre-oxygenation with 100% oxygen Induction Type: IV induction Ventilation: Mask ventilation without difficulty Laryngoscope Size: 3 and McGrath Grade View: Grade I Tube type: Oral Tube size: 7.0 mm Number of attempts: 1 Airway Equipment and Method: Stylet Placement Confirmation: ETT inserted through vocal cords under direct vision, positive ETCO2 and breath sounds checked- equal and bilateral Secured at: 21 cm Tube secured with: Tape Dental Injury: Teeth and Oropharynx as per pre-operative assessment

## 2024-11-13 NOTE — Anesthesia Preprocedure Evaluation (Signed)
 Anesthesia Evaluation  Patient identified by MRN, date of birth, ID band Patient awake    Reviewed: Allergy & Precautions, NPO status , Patient's Chart, lab work & pertinent test results, reviewed documented beta blocker date and time   Airway Mallampati: II  TM Distance: >3 FB Neck ROM: Limited    Dental  (+) Partial Lower, Partial Upper   Pulmonary neg pulmonary ROS, former smoker   Pulmonary exam normal breath sounds clear to auscultation       Cardiovascular hypertension, Pt. on medications and Pt. on home beta blockers + CAD, + Past MI, + Cardiac Stents, + Peripheral Vascular Disease and +CHF  Normal cardiovascular exam Rhythm:Regular Rate:Normal  Right carotid stenosis S/P endarterectomy with patch graft Acute MI Stent RCA Mild pulmonary HTN  Echo 05/13/20 1. Left ventricular ejection fraction, by estimation, is 60 to 65%. The left ventricle has normal function. The left ventricle has no regional wall motion abnormalities. Left ventricular diastolic parameters are indeterminate.  2. Right ventricular systolic function is normal. The right ventricular size is normal. There is moderately elevated pulmonary artery systolic pressure.  3. Left atrial size was mildly dilated.  4. The mitral valve is normal in structure. Moderate mitral valve regurgitation. No evidence of mitral stenosis.  5. The aortic valve is tricuspid. Aortic valve regurgitation is mild. Mild to moderate aortic valve sclerosis/calcification is present, without any evidence of aortic stenosis.  6. The inferior vena cava is normal in size with greater than 50% respiratory variability, suggesting right atrial pressure of 3 mmHg.    Neuro/Psych CVA, No Residual Symptoms  negative psych ROS   GI/Hepatic negative GI ROS, Neg liver ROS,,,  Endo/Other  diabetes, Well Controlled, Type 2, Oral Hypoglycemic Agents  Hyperlipidemia  Renal/GU Renal disease      Musculoskeletal   Abdominal   Peds  Hematology negative hematology ROS (+) Eliquis  and Plavix  therapy- last dose this am   Anesthesia Other Findings Patient is non verbal. Consent obtained with his daughter.   Past Medical History: No date: Acute myocardial infarction No date: Anemia No date: Aortic insufficiency No date: Carotid stenosis No date: Chronic combined systolic and diastolic CHF (congestive  heart failure) (HCC) No date: Coronary artery disease     Comment:  PTCA and stenting of his right coronary artery. 3.5 x               16-mm Liberte stent.             No date: Diabetes mellitus without complication (HCC) No date: Dyslipidemia No date: Emphysema (subcutaneous) (surgical) resulting from a  procedure No date: Hyperlipemia No date: Hypertension No date: Mitral regurgitation No date: Partial tear of rotator cuff     Comment:  right shoulder No date: Peripheral vascular disease No date: Persistent atrial fibrillation (HCC) No date: Pulmonary hypertension (HCC) No date: Stroke [facility]) No date: Wears dentures  Past Surgical History: 11/05/2008: CARDIAC CATHETERIZATION 10/22/1993: CARDIAC CATHETERIZATION     Comment:  EF 60% 06/05/2020: CARDIOVERSION; N/A     Comment:  Procedure: CARDIOVERSION;  Surgeon: Alveta Aleene PARAS,               MD;  Location: [facility];  Service: Cardiovascular;                Laterality: N/A; 03/23/2018: CAROTID ENDARTERECTOMY; Right No date: CORONARY ANGIOPLASTY WITH STENT PLACEMENT 03/23/2018: ENDARTERECTOMY; Right     Comment:  Procedure: RIGHT CAROTID ARTERY ENDARTERECTOMY;  Surgeon: Sheree Penne Bruckner, MD;  Location: [facility] OR;              Service: Vascular;  Laterality: Right; No date: FRACTURE SURGERY     Comment:  righ fibula and tibia 1984: HERNIA REPAIR No date: KNEE ARTHROSCOPY; Left No date: MULTIPLE TOOTH EXTRACTIONS 03/23/2018: PATCH ANGIOPLASTY; Right     Comment:  Procedure: WITH XENOSURE 1CM  X 6CM PATCH ANGIOPLASTY;                Surgeon: Sheree Penne Bruckner, MD;  Location: [facility] OR;              Service: Vascular;  Laterality: Right; 06/09/2017: SHOULDER ARTHROSCOPY; Right     Comment:  Procedure: ARTHROSCOPY SHOULDER SUBACROMIAL               DECOMPRESSION AND ACROMIOPLASTY;  Surgeon: Sheril Coy, MD;  Location: [facility] OR;  Service: Orthopedics;                Laterality: Right;  GENERAL ANESTHESIA WITH BLOCK  BMI    Body Mass Index: 18.79 kg/m      Reproductive/Obstetrics negative OB ROS                              Anesthesia Physical Anesthesia Plan  ASA: 3  Anesthesia Plan: General ETT   Post-op Pain Management:    Induction: Intravenous  PONV Risk Score and Plan: 2 and Ondansetron  and Dexamethasone  Airway Management Planned: Oral ETT  Additional Equipment:   Intra-op Plan:   Post-operative Plan: Extubation in OR  Informed Consent: I have reviewed the patients History and Physical, chart, labs and discussed the procedure including the risks, benefits and alternatives for the proposed anesthesia with the patient or authorized representative who has indicated his/her understanding and acceptance.     Dental Advisory Given  Plan Discussed with: Anesthesiologist, CRNA and Surgeon  Anesthesia Plan Comments: (Patient and his daughter consented for risks of anesthesia including but not limited to:  - adverse reactions to medications - damage to eyes, teeth, lips or other oral mucosa - nerve damage due to positioning  - sore throat or hoarseness - Damage to heart, brain, nerves, lungs, other parts of body or loss of life  Patient's daughter voiced understanding and assent.)        Anesthesia Quick Evaluation

## 2024-11-13 NOTE — Transfer of Care (Signed)
 Immediate Anesthesia Transfer of Care Note  Patient: Todd Armstrong  Procedure(s) Performed: REPAIR, HERNIA, INGUINAL, ADULT (Right: Groin)  Patient Location: PACU  Anesthesia Type:General  Level of Consciousness: sedated  Airway & Oxygen Therapy: Patient Spontanous Breathing and Patient connected to face mask oxygen  Post-op Assessment: Report given to RN  Post vital signs: Reviewed and stable  Last Vitals:  Vitals Value Taken Time  BP 151/77 11/13/24 09:05  Temp    Pulse 55 11/13/24 09:09  Resp 15 11/13/24 09:09  SpO2 100 % 11/13/24 09:09  Vitals shown include unfiled device data.  Last Pain:  Vitals:   11/13/24 0624  TempSrc: Tympanic  PainSc: 0-No pain         Complications: No notable events documented.

## 2024-11-13 NOTE — Op Note (Signed)
 Preoprative diagnosis: Right, initial, reducible inguinal Hernia.  Postoperative diagnosis: Same  Procedure:  Open right inguinal hernia repair with mesh  Anesthesia: General, LMA  Surgeon: Dr. Tye  Wound Classification: Clean  Specimen: none  Complications: None  Estimated Blood Loss: 10mL   Indications:  Patient is a 89 y.o. male developed a symptomatic right inguinal hernia. Repair was indicated to avoid complications of incarceration, obstruction and pain, and a prosthetic mesh repair was elected.  Findings: 1. Vas Deferens and cord structures identified and preserved 2. Progrip mesh used for repair 3. Adequate hemostasis achieved  Description of procedure: The patient was taken to the operating room. A time-out was completed verifying correct patient, procedure, site, positioning, and implant(s) and/or special equipment prior to beginning this procedure. The right groin was prepped and draped in the usual sterile fashion. An incision was marked in a natural skin crease and planned to end near the pubic tubercle.  The skin crease incision was made with a knife and deepened through Scarpa's and Camper's fascia with electrocautery until the aponeurosis of the external oblique was encountered. This was cleaned and the external ring was exposed. Hemostasis was achieved in the wound. An incision was made in the midportion of the external oblique aponeurosis in the direction of its fibers.  Flaps of the external oblique were developed cephalad and inferiorly.  The cord was identified. It was gently dissected free at the pubic tubercle and encircled with a Penrose drain. Attention was directed to the anteromedial aspect of the cord, where an broad-based indirect hernia sac was identified. The sac was carefully dissected free of the cord down to the level of the very lax internal ring and reduced back into abdominal cavity along The vas and testicular vessels were identified and protected  from harm.   Attention then turned to the floor of the canal, which was thin but no evidence of direct hernia. The Progrip mesh was inserted and secured to the pubic tubercle using interrupted 0 ethibond sutures. Care was taken to assure that the mesh was placed flat against the floor and wrapped loosely around the cord structures.  Additional 0 Ethibond used to secure the inferior aspect of the mesh to Cooper's ligament. hemostasis was again checked. The Penrose drain was removed. Exparel  infused as an ilioinguinal block.  The external oblique aponeurosis was closed with a running suture of 2-0 Vicryl, taking care not to catch the ilioinguinal nerve in the suture line. Scarpa's fascia was closed with interrupted 3-0 Vicryl.  The deep dermal layer closed with interrupted 3-0 Vicryl.  The skin was closed with a subcuticular stitch of Monocryl 4-0. Dermabond was applied.  The testis was gently pulled down into its anatomic position in the scrotum.  The patient tolerated the procedure well and was taken to the postanesthesia care unit in stable condition. Sponge and instrument count correct at end of procedure.

## 2024-11-13 NOTE — Addendum Note (Signed)
 Addendum  created 11/13/24 1244 by Veronica Alm BROCKS, CRNA   Intraprocedure Meds edited

## 2024-11-13 NOTE — Interval H&P Note (Signed)
 No change. OK to proceed.

## 2024-11-13 NOTE — Anesthesia Postprocedure Evaluation (Signed)
 Anesthesia Post Note  Patient: Todd Armstrong  Procedure(s) Performed: REPAIR, HERNIA, INGUINAL, ADULT (Right: Groin)  Patient location during evaluation: PACU Anesthesia Type: General Level of consciousness: awake and alert Pain management: pain level controlled Vital Signs Assessment: post-procedure vital signs reviewed and stable Respiratory status: spontaneous breathing, nonlabored ventilation, respiratory function stable and patient connected to nasal cannula oxygen Cardiovascular status: blood pressure returned to baseline and stable Postop Assessment: no apparent nausea or vomiting Anesthetic complications: no   No notable events documented.   Last Vitals:  Vitals:   11/13/24 1017 11/13/24 1100  BP: 103/78 139/80  Pulse: 60 71  Resp: 18 16  Temp: (!) 36.2 C   SpO2: 98% 95%    Last Pain:  Vitals:   11/13/24 1100  TempSrc:   PainSc: 0-No pain                 Debby Mines

## 2024-11-13 NOTE — Discharge Instructions (Signed)
 Hernia repair, Care After This sheet gives you information about how to care for yourself after your procedure. Your health care provider may also give you more specific instructions. If you have problems or questions, contact your health care provider. What can I expect after the procedure? After your procedure, it is common to have the following: Pain in your abdomen, especially in the incision areas. You will be given medicine to control the pain. Tiredness. This is a normal part of the recovery process. Your energy level will return to normal over the next several weeks. Changes in your bowel movements, such as constipation or needing to go more often. Talk with your health care provider about how to manage this. Follow these instructions at home: Medicines  tylenol  as needed for discomfort.   Use narcotics, if prescribed, only when tylenol   is not enough to control pain.  325-650mg  every 8hrs to max of 3000mg /24hrs (including the 325mg  in every norco dose) for the tylenol .   Resume Eliquis  and Plavix  in 48 hours PLEASE RECORD NUMBER OF PILLS TAKEN UNTIL NEXT FOLLOW UP APPT.  THIS WILL HELP DETERMINE HOW READY YOU ARE TO BE RELEASED FROM ANY ACTIVITY RESTRICTIONS Do not drive or use heavy machinery while taking prescription pain medicine. Do not drink alcohol while taking prescription pain medicine.  Incision care    Follow instructions from your health care provider about how to take care of your incision areas. Make sure you: Keep your incisions clean and dry. Wash your hands with soap and water before and after applying medicine to the areas, and before and after changing your bandage (dressing). If soap and water are not available, use hand sanitizer. Change your dressing as told by your health care provider. Leave stitches (sutures), skin glue, or adhesive strips in place. These skin closures may need to stay in place for 2 weeks or longer. If adhesive strip edges start to loosen and  curl up, you may trim the loose edges. Do not remove adhesive strips completely unless your health care provider tells you to do that. Do not wear tight clothing over the incisions. Tight clothing may rub and irritate the incision areas, which may cause the incisions to open. Do not take baths, swim, or use a hot tub until your health care provider approves. OK TO SHOWER IN 24HRS.   Check your incision area every day for signs of infection. Check for: More redness, swelling, or pain. More fluid or blood. Warmth. Pus or a bad smell. Activity Avoid lifting anything that is heavier than 10 lb (4.5 kg) for 2 weeks or until your health care provider says it is okay. No pushing/pulling greater than 30lbs You may resume normal activities as told by your health care provider. Ask your health care provider what activities are safe for you. Take rest breaks during the day as needed. Eating and drinking Follow instructions from your health care provider about what you can eat after surgery. To prevent or treat constipation while you are taking prescription pain medicine, your health care provider may recommend that you: Drink enough fluid to keep your urine clear or pale yellow. Take over-the-counter or prescription medicines. Eat foods that are high in fiber, such as fresh fruits and vegetables, whole grains, and beans. Limit foods that are high in fat and processed sugars, such as fried and sweet foods. General instructions Ask your health care provider when you will need an appointment to get your sutures or staples removed. Keep all follow-up  visits as told by your health care provider. This is important. Contact a health care provider if: You have more redness, swelling, or pain around your incisions. You have more fluid or blood coming from the incisions. Your incisions feel warm to the touch. You have pus or a bad smell coming from your incisions or your dressing. You have a fever. You have  an incision that breaks open (edges not staying together) after sutures or staples have been removed. You develop a rash. You have chest pain or difficulty breathing. You have pain or swelling in your legs. You feel light-headed or you faint. Your abdomen swells (becomes distended). You have nausea or vomiting. You have blood in your stool (feces). This information is not intended to replace advice given to you by your health care provider. Make sure you discuss any questions you have with your health care provider. Document Released: 06/18/2005 Document Revised: 08/18/2018 Document Reviewed: 08/30/2016 Elsevier Interactive Patient Education  2019 [employer].

## 2024-11-14 ENCOUNTER — Ambulatory Visit

## 2024-11-14 ENCOUNTER — Encounter: Payer: Self-pay | Admitting: Surgery

## 2024-11-14 DIAGNOSIS — Z9861 Coronary angioplasty status: Secondary | ICD-10-CM

## 2024-11-14 DIAGNOSIS — Z955 Presence of coronary angioplasty implant and graft: Secondary | ICD-10-CM

## 2024-11-14 NOTE — Progress Notes (Signed)
 Cardiac Individual Treatment Plan  Patient Details  Name: Todd Armstrong MRN: [id number] Date of Birth: [birth year] Referring Provider:   Flowsheet Row Cardiac Rehab from 07/18/2024 in [facility]iac [facility]  Referring Provider Dr. Shanna Sauce    Initial Encounter Date:  Flowsheet Row Cardiac Rehab from 07/18/2024 in [facility] Cardiac and Pulmonary Rehab  Date 07/18/24    Visit Diagnosis: History of percutaneous coronary intervention  Status post coronary artery stent placement  Patient's Home Medications on Admission:  Current Outpatient Medications:    Accu-Chek FastClix Lancets MISC, For use when checking blood sugars, Disp: , Rfl:    acetaminophen  (TYLENOL ) 325 MG tablet, Take 2 tablets (650 mg total) by mouth every 8 (eight) hours as needed for mild pain (pain score 1-3)., Disp: 40 tablet, Rfl: 0   albuterol  (VENTOLIN  HFA) 108 (90 Base) MCG/ACT inhaler, Inhale 2 puffs into the lungs every 6 (six) hours as needed for wheezing or shortness of breath., Disp: 8 g, Rfl: 6   apixaban  (ELIQUIS ) 2.5 MG TABS tablet, Take 2.5 mg by mouth 2 (two) times daily., Disp: , Rfl:    clopidogrel  (PLAVIX ) 75 MG tablet, TAKE 1 TABLET BY MOUTH DAILY, Disp: 100 tablet, Rfl: 3   docusate sodium  (COLACE) 100 MG capsule, Take 1 capsule (100 mg total) by mouth 2 (two) times daily as needed for up to 10 days for mild constipation., Disp: 20 capsule, Rfl: 0   doxycycline (VIBRAMYCIN) 50 MG capsule, Take 50 mg by mouth 2 (two) times daily., Disp: , Rfl:    fexofenadine (ALLEGRA) 180 MG tablet, Take 180 mg by mouth daily., Disp: , Rfl:    metoprolol  succinate (TOPROL -XL) 25 MG 24 hr tablet, Take 25 mg by mouth daily., Disp: , Rfl:    nitroGLYCERIN  (NITROSTAT ) 0.4 MG SL tablet, Place 1 tablet (0.4 mg total) under the tongue every 5 (five) minutes as needed for chest pain., Disp: 25 tablet, Rfl: 6   oxyCODONE  (ROXICODONE ) 5 MG immediate release tablet, Take 1 tablet (5 mg total) by mouth every 8 (eight) hours  as needed., Disp: 6 tablet, Rfl: 0   PARoxetine (PAXIL) 20 MG tablet, Take 20 mg by mouth daily., Disp: , Rfl:    potassium chloride  (KLOR-CON ) 10 MEQ tablet, Take 1 tablet (10 mEq total) by mouth 2 (two) times daily., Disp: 60 tablet, Rfl: 0   rosuvastatin  (CRESTOR ) 10 MG tablet, TAKE 1 TABLET BY MOUTH DAILY, Disp: 100 tablet, Rfl: 1   tamsulosin  (FLOMAX ) 0.4 MG CAPS capsule, Take 0.4 mg by mouth at bedtime. , Disp: , Rfl:    torsemide  (DEMADEX ) 20 MG tablet, Take 1/2 tablet by mouth only on Mondays, Wednesdays, and Fridays (Patient taking differently: Take 20 mg by mouth daily.), Disp: 12 tablet, Rfl: 6  Past Medical History: Past Medical History:  Diagnosis Date   Acute myocardial infarction    Anemia    Aortic insufficiency    Carotid stenosis    Chronic combined systolic and diastolic CHF (congestive heart failure) (HCC)    Coronary artery disease    PTCA and stenting of his right coronary artery. 3.5 x 16-mm Liberte stent.               Diabetes mellitus without complication (HCC)    Dyslipidemia    Emphysema (subcutaneous) (surgical) resulting from a procedure    Hyperlipemia    Hypertension    Mitral regurgitation    Partial tear of rotator cuff    right shoulder   Peripheral  vascular disease    Persistent atrial fibrillation (HCC)    Pulmonary hypertension (HCC)    Stroke (HCC)    Wears dentures     Tobacco Use: Social History   Tobacco Use  Smoking Status Former   Current packs/day: 0.00   Average packs/day: 1 pack/day for 8.0 years (8.0 ttl pk-yrs)   Types: Cigarettes   Start date: 09/12/1960   Quit date: 09/12/1968   Years since quitting: 56.2  Smokeless Tobacco Never  Tobacco Comments   quit smoking cigarettes > 40 years ago    Labs: Review Flowsheet  More data exists      Latest Ref Rng & Units 02/19/2019 04/22/2020 06/03/2021 09/28/2021 09/09/2022  Labs for ITP Cardiac and Pulmonary Rehab  Cholestrol 100 - 199 mg/dL 867  851  868  - 862   LDL (calc) 0 -  99 mg/dL 37  52  38  - 33   HDL-C >39 mg/dL 76  75  79  - 88   Trlycerides 0 - 149 mg/dL 96  880  72  - 83   Hemoglobin A1c 4.8 - 5.6 % - - - 6.3  -     Exercise Target Goals: Exercise Program Goal: Individual exercise prescription set using results from initial 6 min walk test and THRR while considering  patient's activity barriers and safety.   Exercise Prescription Goal: Initial exercise prescription builds to 30-45 minutes a day of aerobic activity, 2-3 days per week.  Home exercise guidelines will be given to patient during program as part of exercise prescription that the participant will acknowledge.   Education: Aerobic Exercise: - Group verbal and visual presentation on the components of exercise prescription. Introduces F.I.T.T principle from ACSM for exercise prescriptions.  Reviews F.I.T.T. principles of aerobic exercise including progression. Written material provided at class time. Flowsheet Row Cardiac Rehab from 07/18/2024 in [facility] Cardiac and Pulmonary Rehab  Education need identified 07/18/24    Education: Resistance Exercise: - Group verbal and visual presentation on the components of exercise prescription. Introduces F.I.T.T principle from ACSM for exercise prescriptions  Reviews F.I.T.T. principles of resistance exercise including progression. Written material provided at class time.    Education: Exercise & Equipment Safety: - Individual verbal instruction and demonstration of equipment use and safety with use of the equipment. Flowsheet Row Cardiac Rehab from 07/18/2024 in [facility] Cardiac and Pulmonary Rehab  Date 07/18/24  Educator [facility]  Instruction Review Code 1- Verbalizes Understanding    Education: Exercise Physiology & General Exercise Guidelines: - Group verbal and written instruction with models to review the exercise physiology of the cardiovascular system and associated critical values. Provides general exercise guidelines with specific guidelines to those  with heart or lung disease. Written material provided at class time. Flowsheet Row Cardiac Rehab from 07/18/2024 in [facility] Cardiac and Pulmonary Rehab  Education need identified 07/18/24    Education: Flexibility, Balance, Mind/Body Relaxation: - Group verbal and visual presentation with interactive activity on the components of exercise prescription. Introduces F.I.T.T principle from ACSM for exercise prescriptions. Reviews F.I.T.T. principles of flexibility and balance exercise training including progression. Also discusses the mind body connection.  Reviews various relaxation techniques to help reduce and manage stress (i.e. Deep breathing, progressive muscle relaxation, and visualization). Balance handout provided to take home. Written material provided at class time. Flowsheet Row Pulmonary Rehab from 01/21/2022 in [facility] Cardiac and Pulmonary Rehab  Date 12/23/21  Educator AS  Instruction Review Code 1- Verbalizes Understanding    Activity Barriers &  Risk Stratification:  Activity Barriers & Cardiac Risk Stratification - 07/18/24 1145       Activity Barriers & Cardiac Risk Stratification   Activity Barriers Back Problems;Neck/Spine Problems;Balance Concerns;History of Falls    Cardiac Risk Stratification High          6 Minute Walk:  6 Minute Walk     Row Name 07/18/24 1143         6 Minute Walk   Phase Initial     Distance 700 feet     Walk Time 6 minutes     # of Rest Breaks 0     MPH 1.33     METS 1.35     RPE 11     Perceived Dyspnea  0     VO2 Peak 4.73     Symptoms No     Resting HR 79 bpm     Resting BP 124/80     Resting Oxygen Saturation  98 %     Exercise Oxygen Saturation  during 6 min walk 92 %     Max Ex. HR 105 bpm     Max Ex. BP 150/80     2 Minute Post BP 124/82        Oxygen Initial Assessment:   Oxygen Re-Evaluation:   Oxygen Discharge (Final Oxygen Re-Evaluation):   Initial Exercise Prescription:  Initial Exercise Prescription - 07/18/24  1100       Date of Initial Exercise RX and Referring Provider   Date 07/18/24    Referring Provider Dr. Shanna Sauce      Oxygen   Maintain Oxygen Saturation 88% or higher      Recumbant Bike   Level 1    RPM 50    Watts 15    Minutes 15    METs 1.35      NuStep   Level 1    SPM 80    Minutes 15    METs 1.35      Biostep-RELP   Level 1    SPM 50    Minutes 15    METs 1.35      Track   Laps 10    Minutes 15    METs 1.54      Prescription Details   Frequency (times per week) 3    Duration Progress to 30 minutes of continuous aerobic without signs/symptoms of physical distress      Intensity   THRR 40-80% of Max Heartrate 99-119    Ratings of Perceived Exertion 11-13    Perceived Dyspnea 0-4      Progression   Progression Continue to progress workloads to maintain intensity without signs/symptoms of physical distress.      Resistance Training   Training Prescription Yes    Weight 2    Reps 10-15          Perform Capillary Blood Glucose checks as needed.  Exercise Prescription Changes:   Exercise Prescription Changes     Row Name 07/18/24 1100 08/08/24 1400 08/23/24 1800 09/19/24 0800 10/04/24 1000     Response to Exercise   Blood Pressure (Admit) 124/80 116/64 130/64 112/68 110/62   Blood Pressure (Exercise) 150/80 132/54 146/60 -- --   Blood Pressure (Exit) 124/82 104/64 110/60 104/60 102/60   Heart Rate (Admit) 79 bpm 92 bpm 6 bpm 86 bpm 76 bpm   Heart Rate (Exercise) 105 bpm 123 bpm 125 bpm 115 bpm 117 bpm   Heart Rate (Exit) 88 bpm 95  bpm 88 bpm 84 bpm 86 bpm   Oxygen Saturation (Admit) 98 % -- -- -- --   Oxygen Saturation (Exercise) 92 % -- -- -- --   Oxygen Saturation (Exit) 100 % -- -- -- --   Rating of Perceived Exertion (Exercise) 11 13 13 13 13    Perceived Dyspnea (Exercise) 0 0 0 -- --   Symptoms none none none none none   Comments 6 MWT first 2 weeks of exercise -- -- --   Duration -- Progress to 30 minutes of  aerobic without  signs/symptoms of physical distress Progress to 30 minutes of  aerobic without signs/symptoms of physical distress Progress to 30 minutes of  aerobic without signs/symptoms of physical distress Progress to 30 minutes of  aerobic without signs/symptoms of physical distress   Intensity -- THRR unchanged THRR unchanged THRR unchanged THRR unchanged     Progression   Progression -- Continue to progress workloads to maintain intensity without signs/symptoms of physical distress. Continue to progress workloads to maintain intensity without signs/symptoms of physical distress. Continue to progress workloads to maintain intensity without signs/symptoms of physical distress. Continue to progress workloads to maintain intensity without signs/symptoms of physical distress.   Average METs -- 2.06 2.26 2.13 2.24     Resistance Training   Training Prescription -- Yes Yes Yes Yes   Weight -- 2 2 2  lb 2 lb   Reps -- 10-15 10-15 10-15 10-15     Interval Training   Interval Training -- No No No No     Recumbant Bike   Level -- 1 -- -- --   Watts -- 12 -- -- --   Minutes -- 15 -- -- --   METs -- 2.87 -- -- --     NuStep   Level -- 3 3 5 3    Minutes -- 15 15 15 15    METs -- 2.5 2.14 2 2.6     Biostep-RELP   Level -- 1 3 1 2    Minutes -- 15 15 15 15    METs -- 2 2 2 2      Track   Laps -- 17 23 23 27    Minutes -- 15 15 15 15    METs -- 1.92 2.25 2.25 2.47     Oxygen   Maintain Oxygen Saturation -- 88% or higher 88% or higher 88% or higher 88% or higher      Exercise Comments:   Exercise Comments     Row Name 07/23/24 0935           Exercise Comments First full day of exercise!  Patient was oriented to gym and equipment including functions, settings, policies, and procedures.  Patient's individual exercise prescription and treatment plan were reviewed.  All starting workloads were established based on the results of the 6 minute walk test done at initial orientation visit.  The plan for  exercise progression was also introduced and progression will be customized based on patient's performance and goals.          Exercise Goals and Review:   Exercise Goals     Row Name 07/18/24 1155             Exercise Goals   Increase Physical Activity Yes       Intervention Provide advice, education, support and counseling about physical activity/exercise needs.;Develop an individualized exercise prescription for aerobic and resistive training based on initial evaluation findings, risk stratification, comorbidities and participant's personal goals.  Expected Outcomes Short Term: Attend rehab on a regular basis to increase amount of physical activity.;Long Term: Add in home exercise to make exercise part of routine and to increase amount of physical activity.;Long Term: Exercising regularly at least 3-5 days a week.       Increase Strength and Stamina Yes       Intervention Provide advice, education, support and counseling about physical activity/exercise needs.;Develop an individualized exercise prescription for aerobic and resistive training based on initial evaluation findings, risk stratification, comorbidities and participant's personal goals.       Expected Outcomes Short Term: Increase workloads from initial exercise prescription for resistance, speed, and METs.;Short Term: Perform resistance training exercises routinely during rehab and add in resistance training at home;Long Term: Improve cardiorespiratory fitness, muscular endurance and strength as measured by increased METs and functional capacity ( )       Able to understand and use rate of perceived exertion (RPE) scale Yes       Intervention Provide education and explanation on how to use RPE scale       Expected Outcomes Short Term: Able to use RPE daily in rehab to express subjective intensity level;Long Term:  Able to use RPE to guide intensity level when exercising independently       Able to understand and use  Dyspnea scale Yes       Intervention Provide education and explanation on how to use Dyspnea scale       Expected Outcomes Short Term: Able to use Dyspnea scale daily in rehab to express subjective sense of shortness of breath during exertion;Long Term: Able to use Dyspnea scale to guide intensity level when exercising independently       Knowledge and understanding of Target Heart Rate Range (THRR) Yes       Intervention Provide education and explanation of THRR including how the numbers were predicted and where they are located for reference       Expected Outcomes Short Term: Able to state/look up THRR;Long Term: Able to use THRR to govern intensity when exercising independently;Short Term: Able to use daily as guideline for intensity in rehab       Able to check pulse independently Yes       Intervention Provide education and demonstration on how to check pulse in carotid and radial arteries.;Review the importance of being able to check your own pulse for safety during independent exercise       Expected Outcomes Short Term: Able to explain why pulse checking is important during independent exercise;Long Term: Able to check pulse independently and accurately       Understanding of Exercise Prescription Yes       Intervention Provide education, explanation, and written materials on patient's individual exercise prescription       Expected Outcomes Short Term: Able to explain program exercise prescription;Long Term: Able to explain home exercise prescription to exercise independently          Exercise Goals Re-Evaluation :  Exercise Goals Re-Evaluation     Row Name 07/23/24 0936 08/08/24 1420 08/23/24 1829 09/04/24 1505 09/19/24 0814     Exercise Goal Re-Evaluation   Exercise Goals Review Increase Physical Activity;Able to understand and use rate of perceived exertion (RPE) scale;Knowledge and understanding of Target Heart Rate Range (THRR);Understanding of Exercise Prescription;Increase  Strength and Stamina;Able to understand and use Dyspnea scale;Able to check pulse independently Increase Physical Activity;Increase Strength and Stamina;Understanding of Exercise Prescription Increase Physical Activity;Increase Strength and Stamina;Understanding of Exercise Prescription  Increase Physical Activity;Increase Strength and Stamina;Understanding of Exercise Prescription Increase Physical Activity;Increase Strength and Stamina;Understanding of Exercise Prescription   Comments Reviewed RPE and dyspnea scale, THR and program prescription with pt today.  Pt voiced understanding and was given a copy of goals to take home. Dereck is off to a great start in the program, and was able to attend his first few sessions during this review period. During his first sessions he was able to walk 17 laps on the track, and use the T4 nustep at level 3. We will continue to monitor his progress in the program. Hubert is off to a great start in the program. He was able to increase to 23 laps on the track. He also increased to level 3 on the biostep. He maintained level 3 on the T4 nustep. We will continue to monitor his progress in the program. Celvin has not attended rehab since the last review. He is on vacation for the month of september, and plans to return to rehab when he gets back. We will continue to monitor his progress in the program when he returns. Burgess returned to rehab after taking a month off for vacation. He did well walking the track as he continues to reach up to 23 laps. He also improved to level 5 on the T4 nustep. We will continue to monitor his progress in the program.   Expected Outcomes Short: Use RPE daily to regulate intensity. Long: Follow program prescription in THR. Short: Continue to follow exercise prescription. Long: Continue exercise to improve strength and stamina. Short: Continue to push for more laps on the track. Long: Continue exercise to improve strength and stamina. Short:  Return to rehab after vacation. Long: Graduate. Short: Continue to progressively increase workloads. Long: Continue exercise to improve strength and stamina.    Row Name 09/26/24 1028 10/04/24 1044 10/16/24 1518 11/01/24 1043       Exercise Goal Re-Evaluation   Exercise Goals Review Increase Physical Activity;Increase Strength and Stamina Increase Physical Activity;Increase Strength and Stamina;Understanding of Exercise Prescription Increase Physical Activity;Increase Strength and Stamina;Understanding of Exercise Prescription Increase Physical Activity;Increase Strength and Stamina;Understanding of Exercise Prescription    Comments Talked with Carolton's daugter as he is not able to orally communicate. She states that he is very active at home and walks on days he does not come to class. Cillian continues to do well in rehab. He increased his laps on the track to 27. He also increased to level 2 on the biostep. We will continue to monitor his progress in the program. Martrell did not attend rehab during the last review period. He informed staff that he would be out of town and would not attend rehab during this time. We will continue to monitor his progress when he returns to the program. Catarino continues to be on hold as he has been out of town and has not attended rehab during the last review period. We will continue to monitor his progress when he returns to the program.    Expected Outcomes Short: continue to be active at home. Go over home exercise guidelines with patient. Long: maintain independent exercise routine upon graduation from cardiac rehab. Short: Continue to progressively increase laps on the track. Long: Continue exercise to improve strength and stamina. Short: Return to rehab after vacation. Long: Graduate. Short: Return to rehab after vacation. Long: Graduate.       Discharge Exercise Prescription (Final Exercise Prescription Changes):  Exercise Prescription Changes - 10/04/24 1000  Response to Exercise   Blood Pressure (Admit) 110/62    Blood Pressure (Exit) 102/60    Heart Rate (Admit) 76 bpm    Heart Rate (Exercise) 117 bpm    Heart Rate (Exit) 86 bpm    Rating of Perceived Exertion (Exercise) 13    Symptoms none    Duration Progress to 30 minutes of  aerobic without signs/symptoms of physical distress    Intensity THRR unchanged      Progression   Progression Continue to progress workloads to maintain intensity without signs/symptoms of physical distress.    Average METs 2.24      Resistance Training   Training Prescription Yes    Weight 2 lb    Reps 10-15      Interval Training   Interval Training No      NuStep   Level 3    Minutes 15    METs 2.6      Biostep-RELP   Level 2    Minutes 15    METs 2      Track   Laps 27    Minutes 15    METs 2.47      Oxygen   Maintain Oxygen Saturation 88% or higher          Nutrition:  Target Goals: Understanding of nutrition guidelines, daily intake of sodium 1500mg , cholesterol 200mg , calories 30% from fat and 7% or less from saturated fats, daily to have 5 or more servings of fruits and vegetables.  Education: Nutrition 1 -Group instruction provided by verbal, written material, interactive activities, discussions, models, and posters to present general guidelines for heart healthy nutrition including macronutrients, label reading, and promoting whole foods over processed counterparts. Education serves as pensions consultant of discussion of heart healthy eating for all. Written material provided at class time.    Education: Nutrition 2 -Group instruction provided by verbal, written material, interactive activities, discussions, models, and posters to present general guidelines for heart healthy nutrition including sodium, cholesterol, and saturated fat. Providing guidance of habit forming to improve blood pressure, cholesterol, and body weight. Written material provided at class  time.     Biometrics:  Pre Biometrics - 07/18/24 1156       Pre Biometrics   Height 5' 5.5 (1.664 m)    Weight 115 lb 8 oz (52.4 kg)    Waist Circumference 33 inches    Hip Circumference 36 inches    Waist to Hip Ratio 0.92 %    BMI (Calculated) 18.92    Single Leg Stand 5.07 seconds           Nutrition Therapy Plan and Nutrition Goals:   Nutrition Assessments:  MEDIFICTS Score Key: >=70 Need to make dietary changes  40-70 Heart Healthy Diet <= 40 Therapeutic Level Cholesterol Diet  Flowsheet Row Pulmonary Rehab from 01/27/2022 in [facility] Cardiac and Pulmonary Rehab  Picture Your Plate Total Score on Admission 63  Picture Your Plate Total Score on Discharge 56   Picture Your Plate Scores: <59 Unhealthy dietary pattern with much room for improvement. 41-50 Dietary pattern unlikely to meet recommendations for good health and room for improvement. 51-60 More healthful dietary pattern, with some room for improvement.  >60 Healthy dietary pattern, although there may be some specific behaviors that could be improved.    Nutrition Goals Re-Evaluation:  Nutrition Goals Re-Evaluation     Row Name 09/26/24 1019             Goals   Comment Talked  with patient's daughter as patient is unable to orally communicate. She reports that he is eating pureed foods and supplemental drinks.       Expected Outcome Short: continue to eatpureed foods to try to meat nutrition needs. Long: maintain healthy weight.          Nutrition Goals Discharge (Final Nutrition Goals Re-Evaluation):  Nutrition Goals Re-Evaluation - 09/26/24 1019       Goals   Comment Talked with patient's daughter as patient is unable to orally communicate. She reports that he is eating pureed foods and supplemental drinks.    Expected Outcome Short: continue to eatpureed foods to try to meat nutrition needs. Long: maintain healthy weight.          Psychosocial: Target Goals: Acknowledge presence or  absence of significant depression and/or stress, maximize coping skills, provide positive support system. Participant is able to verbalize types and ability to use techniques and skills needed for reducing stress and depression.   Education: Stress, Anxiety, and Depression - Group verbal and visual presentation to define topics covered.  Reviews how body is impacted by stress, anxiety, and depression.  Also discusses healthy ways to reduce stress and to treat/manage anxiety and depression. Written material provided at class time.   Education: Sleep Hygiene -Provides group verbal and written instruction about how sleep can affect your health.  Define sleep hygiene, discuss sleep cycles and impact of sleep habits. Review good sleep hygiene tips.   Initial Review & Psychosocial Screening:  Initial Psych Review & Screening - 07/11/24 1043       Initial Review   Current issues with Current Stress Concerns;Current Psychotropic Meds    Source of Stress Concerns Chronic Illness      Family Dynamics   Good Support System? Yes      Barriers   Psychosocial barriers to participate in program There are no identifiable barriers or psychosocial needs.;The patient should benefit from training in stress management and relaxation.      Screening Interventions   Interventions To provide support and resources with identified psychosocial needs;Encouraged to exercise;Provide feedback about the scores to participant    Expected Outcomes Short Term goal: Utilizing psychosocial counselor, staff and physician to assist with identification of specific Stressors or current issues interfering with healing process. Setting desired goal for each stressor or current issue identified.;Long Term Goal: Stressors or current issues are controlled or eliminated.;Short Term goal: Identification and review with participant of any Quality of Life or Depression concerns found by scoring the questionnaire.;Long Term goal: The  participant improves quality of Life and PHQ9 Scores as seen by post scores and/or verbalization of changes          Quality of Life Scores:   Quality of Life - 07/18/24 1201       Quality of Life   Select Quality of Life      Quality of Life Scores   Health/Function Pre 8.25 %    Socioeconomic Pre 30 %    Psych/Spiritual Pre 22.5 %    Family Pre 30 %    GLOBAL Pre 16.4 %         Scores of 19 and below usually indicate a poorer quality of life in these areas.  A difference of  2-3 points is a clinically meaningful difference.  A difference of 2-3 points in the total score of the Quality of Life Index has been associated with significant improvement in overall quality of life, self-image, physical symptoms, and general  health in studies assessing change in quality of life.  PHQ-9: Review Flowsheet       09/26/2024 07/18/2024 01/27/2022 10/27/2021  Depression screen PHQ 2/9  Decreased Interest 3 1 0 0  Down, Depressed, Hopeless 2 2 0 0  PHQ - 2 Score 5 3 0 0  Altered sleeping 3 3 2 3   Tired, decreased energy 3 2 1 1   Change in appetite 3 3 2 3   Feeling bad or failure about yourself  0 2 0 0  Trouble concentrating 3 3 0 0  Moving slowly or fidgety/restless 2 3 2  0  Suicidal thoughts 0 0 0 0  PHQ-9 Score 19  19  7  7    Difficult doing work/chores Extremely dIfficult Very difficult Not difficult at all Not difficult at all    Details       Data saved with a previous flowsheet row definition        Interpretation of Total Score  Total Score Depression Severity:  1-4 = Minimal depression, 5-9 = Mild depression, 10-14 = Moderate depression, 15-19 = Moderately severe depression, 20-27 = Severe depression   Psychosocial Evaluation and Intervention:  Psychosocial Evaluation - 07/11/24 1054       Psychosocial Evaluation & Interventions   Interventions Encouraged to exercise with the program and follow exercise prescription;Stress management education;Relaxation education     Comments Mr. Lopezgarcia is coming to cardiac rehab. 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 He recently got switched to pureed foods which he is not enjoying very much. His doctor has started him on Paxil to help with his mental health symptoms that have increased since his health decline. His daughter was on the phone orientation and states that they are hopeful this will help with his depression symptoms and stress. He reports no sleep concerns at this time. They are hoping he can gain some stamina during the program    Expected Outcomes Short: attend cardiac rehab for education and exercise Long: develop and maintain positive self care habits    Continue Psychosocial Services  Follow up required by staff          Psychosocial Re-Evaluation:  Psychosocial Re-Evaluation     Row Name 09/26/24 1025             Psychosocial Re-Evaluation   Current issues with None Identified       Comments Re-evaluated PHQ 9 today. Patient's score remained the same at 19. He continues take his medications.       Expected Outcomes Short: contine to attend cardiac rehab for mental health benefits of exercise. Long: maintain good mental health routine.       Interventions Encouraged to attend Pulmonary Rehabilitation for the exercise       Continue Psychosocial Services  Follow up required by staff          Psychosocial Discharge (Final Psychosocial Re-Evaluation):  Psychosocial Re-Evaluation - 09/26/24 1025       Psychosocial Re-Evaluation   Current issues with None Identified    Comments Re-evaluated PHQ 9 today. Patient's score remained the same at 19. He continues take his medications.    Expected Outcomes Short: contine to attend cardiac rehab for mental health benefits of exercise. Long: maintain good mental health routine.    Interventions Encouraged to attend Pulmonary Rehabilitation for the exercise    Continue Psychosocial Services  Follow up required by staff           Vocational Rehabilitation: Provide vocational  rehab assistance to qualifying candidates.   Vocational Rehab Evaluation & Intervention:  Vocational Rehab - 07/11/24 1043       Initial Vocational Rehab Evaluation & Intervention   Assessment shows need for Vocational Rehabilitation No          Education: Education Goals: Education classes will be provided on a variety of topics geared toward better understanding of heart health and risk factor modification. Participant will state understanding/return demonstration of topics presented as noted by education test scores.  Learning Barriers/Preferences:  Learning Barriers/Preferences - 07/11/24 1149       Learning Barriers/Preferences   Learning Barriers --   nonverbal   Learning Preferences Individual Instruction          General Cardiac Education Topics:  AED/CPR: - Group verbal and written instruction with the use of models to demonstrate the basic use of the AED with the basic ABC's of resuscitation.   Test and Procedures: - Group verbal and visual presentation and models provide information about basic cardiac anatomy and function. Reviews the testing methods done to diagnose heart disease and the outcomes of the test results. Describes the treatment choices: Medical Management, Angioplasty, or Coronary Bypass Surgery for treating various heart conditions including Myocardial Infarction, Angina, Valve Disease, and Cardiac Arrhythmias. Written material provided at class time.   Medication Safety: - Group verbal and visual instruction to review commonly prescribed medications for heart and lung disease. Reviews the medication, class of the drug, and side effects. Includes the steps to properly store meds and maintain the prescription regimen. Written material provided at class time. Flowsheet Row Pulmonary Rehab from 01/21/2022 in [facility] Cardiac and Pulmonary Rehab  Date 12/30/21  Educator SB  Instruction Review  Code 1- Verbalizes Understanding    Intimacy: - Group verbal instruction through game format to discuss how heart and lung disease can affect sexual intimacy. Written material provided at class time. Flowsheet Row Cardiac Rehab from 07/18/2024 in [facility] Cardiac and Pulmonary Rehab  Education need identified 07/18/24    Know Your Numbers and Heart Failure: - Group verbal and visual instruction to discuss disease risk factors for cardiac and pulmonary disease and treatment options.  Reviews associated critical values for Overweight/Obesity, Hypertension, Cholesterol, and Diabetes.  Discusses basics of heart failure: signs/symptoms and treatments.  Introduces Heart Failure Zone chart for action plan for heart failure. Written material provided at class time. Flowsheet Row Cardiac Rehab from 07/18/2024 in [facility] Cardiac and Pulmonary Rehab  Education need identified 07/18/24    Infection Prevention: - Provides verbal and written material to individual with discussion of infection control including proper hand washing and proper equipment cleaning during exercise session. Flowsheet Row Cardiac Rehab from 07/18/2024 in [facility] Cardiac and Pulmonary Rehab  Date 07/18/24  Educator [facility]  Instruction Review Code 1- Verbalizes Understanding    Falls Prevention: - Provides verbal and written material to individual with discussion of falls prevention and safety. Flowsheet Row Cardiac Rehab from 07/18/2024 in [facility] Cardiac and Pulmonary Rehab  Date 07/18/24  Educator [facility]  Instruction Review Code 1- Verbalizes Understanding    Other: -Provides group and verbal instruction on various topics (see comments)   Knowledge Questionnaire Score:  Knowledge Questionnaire Score - 07/18/24 1202       Knowledge Questionnaire Score   Pre Score 20/26          Core Components/Risk Factors/Patient Goals at Admission:  Personal Goals and Risk Factors at Admission - 07/18/24 1156       Core Components/Risk  Factors/Patient Goals on Admission    Weight Management Yes;Weight Gain    Intervention Weight Management: Develop a combined nutrition and exercise program designed to reach desired caloric intake, while maintaining appropriate intake of nutrient and fiber, sodium and fats, and appropriate energy expenditure required for the weight goal.;Weight Management: Provide education and appropriate resources to help participant work on and attain dietary goals.;Weight Management/Obesity: Establish reasonable short term and long term weight goals.    Admit Weight 115 lb 8 oz (52.4 kg)    Goal Weight: Short Term 120 lb (54.4 kg)    Goal Weight: Long Term 130 lb (59 kg)    Expected Outcomes Short Term: Continue to assess and modify interventions until short term weight is achieved;Long Term: Adherence to nutrition and physical activity/exercise program aimed toward attainment of established weight goal;Understanding recommendations for meals to include 15-35% energy as protein, 25-35% energy from fat, 35-60% energy from carbohydrates, less than 200mg  of dietary cholesterol, 20-35 gm of total fiber daily;Weight Gain: Understanding of general recommendations for a high calorie, high protein meal plan that promotes weight gain by distributing calorie intake throughout the day with the consumption for 4-5 meals, snacks, and/or supplements;Understanding of distribution of calorie intake throughout the day with the consumption of 4-5 meals/snacks    Diabetes Yes    Intervention Provide education about signs/symptoms and action to take for hypo/hyperglycemia.;Provide education about proper nutrition, including hydration, and aerobic/resistive exercise prescription along with prescribed medications to achieve blood glucose in normal ranges: Fasting glucose 65-99 mg/dL    Expected Outcomes Short Term: Participant verbalizes understanding of the signs/symptoms and immediate care of hyper/hypoglycemia, proper foot care and  importance of medication, aerobic/resistive exercise and nutrition plan for blood glucose control.;Long Term: Attainment of HbA1C < 7%.    Heart Failure Yes    Intervention Provide a combined exercise and nutrition program that is supplemented with education, support and counseling about heart failure. Directed toward relieving symptoms such as shortness of breath, decreased exercise tolerance, and extremity edema.    Expected Outcomes Improve functional capacity of life;Short term: Attendance in program 2-3 days a week with increased exercise capacity. Reported lower sodium intake. Reported increased fruit and vegetable intake. Reports medication compliance.;Short term: Daily weights obtained and reported for increase. Utilizing diuretic protocols set by physician.;Long term: Adoption of self-care skills and reduction of barriers for early signs and symptoms recognition and intervention leading to self-care maintenance.    Hypertension Yes    Intervention Provide education on lifestyle modifcations including regular physical activity/exercise, weight management, moderate sodium restriction and increased consumption of fresh fruit, vegetables, and low fat dairy, alcohol moderation, and smoking cessation.;Monitor prescription use compliance.    Expected Outcomes Short Term: Continued assessment and intervention until BP is < 140/84mm HG in hypertensive participants. < 130/36mm HG in hypertensive participants with diabetes, heart failure or chronic kidney disease.;Long Term: Maintenance of blood pressure at goal levels.    Lipids Yes    Intervention Provide education and support for participant on nutrition & aerobic/resistive exercise along with prescribed medications to achieve LDL 70mg , HDL >40mg .    Expected Outcomes Short Term: Participant states understanding of desired cholesterol values and is compliant with medications prescribed. Participant is following exercise prescription and nutrition  guidelines.;Long Term: Cholesterol controlled with medications as prescribed, with individualized exercise RX and with personalized nutrition plan. Value goals: LDL < 70mg , HDL > 40 mg.          Education:Diabetes - Individual verbal and written instruction  to review signs/symptoms of diabetes, desired ranges of glucose level fasting, after meals and with exercise. Acknowledge that pre and post exercise glucose checks will be done for 3 sessions at entry of program. Flowsheet Row Cardiac Rehab from 07/18/2024 in [facility] Cardiac and Pulmonary Rehab  Date 07/18/24  Educator [facility]  Instruction Review Code 1- Verbalizes Understanding    Core Components/Risk Factors/Patient Goals Review:   Goals and Risk Factor Review     Row Name 09/26/24 1022             Core Components/Risk Factors/Patient Goals Review   Personal Goals Review Heart Failure;Hypertension;Lipids;Diabetes       Review Talked with patient's daughter as patient is unable to orally communicate. She reported that he does take all medications to manage diabetes, blood pressure, and cholesterol. He also has a scale at home he can use to monitor weight and look for signs of heart failure.       Expected Outcomes Short: check BP at home. Long: control cardiac risk factors.          Core Components/Risk Factors/Patient Goals at Discharge (Final Review):   Goals and Risk Factor Review - 09/26/24 1022       Core Components/Risk Factors/Patient Goals Review   Personal Goals Review Heart Failure;Hypertension;Lipids;Diabetes    Review Talked with patient's daughter as patient is unable to orally communicate. She reported that he does take all medications to manage diabetes, blood pressure, and cholesterol. He also has a scale at home he can use to monitor weight and look for signs of heart failure.    Expected Outcomes Short: check BP at home. Long: control cardiac risk factors.          ITP Comments:  ITP Comments     Row Name  07/11/24 1106 07/18/24 1136 07/23/24 0935 08/08/24 0920 09/05/24 1355   ITP Comments Initial phone call completed. Diagnosis can be found in [facility]. EP Orientation scheduled for Wednesday 8/6 at 10am. Completed and gym orientation for cardiac rehab. Initial ITP created and sent for review to Dr. Oneil Pinal, Medical Director. First full day of exercise!  Patient was oriented to gym and equipment including functions, settings, policies, and procedures.  Patient's individual exercise prescription and treatment plan were reviewed.  All starting workloads were established based on the results of the 6 minute walk test done at initial orientation visit.  The plan for exercise progression was also introduced and progression will be customized based on patient's performance and goals. 30 Day review completed. Medical Director ITP review done; changes made as directed and signed approval by Medical Director. New to program. 30 Day review completed. Medical Director ITP review done; changes made as directed and signed approval by Medical Director.    Row Name 10/03/24 1006 10/31/24 0949 11/14/24 1517       ITP Comments 30 Day review completed. Medical Director ITP review done; changes made as directed and signed approval by Medical Director. 30 Day review completed. Medical Director ITP review done, changes made as directed, and signed approval by Medical Director. Early discharge due to other medical issues preventing him from attending regularly        Comments: early discharge

## 2024-11-14 NOTE — Progress Notes (Signed)
 Early Discharge Summary  Todd Armstrong 11/12/1931  [name] is discharging early due to medical issues preventing regular attendance. He completed 19 of 36 sessions.    6 Minute Walk     Row Name 07/18/24 1143         6 Minute Walk   Phase Initial     Distance 700 feet     Walk Time 6 minutes     # of Rest Breaks 0     MPH 1.33     METS 1.35     RPE 11     Perceived Dyspnea  0     VO2 Peak 4.73     Symptoms No     Resting HR 79 bpm     Resting BP 124/80     Resting Oxygen Saturation  98 %     Exercise Oxygen Saturation  during 6 min walk 92 %     Max Ex. HR 105 bpm     Max Ex. BP 150/80     2 Minute Post BP 124/82

## 2024-11-16 ENCOUNTER — Ambulatory Visit

## 2024-11-19 ENCOUNTER — Encounter: Payer: Self-pay | Admitting: Surgery

## 2024-11-19 ENCOUNTER — Ambulatory Visit

## 2024-11-21 ENCOUNTER — Ambulatory Visit

## 2024-11-23 ENCOUNTER — Ambulatory Visit

## 2024-11-26 ENCOUNTER — Ambulatory Visit

## 2024-11-28 ENCOUNTER — Ambulatory Visit

## 2024-11-30 ENCOUNTER — Ambulatory Visit

## 2024-12-03 ENCOUNTER — Ambulatory Visit

## 2024-12-04 ENCOUNTER — Other Ambulatory Visit (HOSPITAL_BASED_OUTPATIENT_CLINIC_OR_DEPARTMENT_OTHER): Payer: Self-pay

## 2024-12-04 ENCOUNTER — Other Ambulatory Visit (HOSPITAL_COMMUNITY): Payer: Self-pay

## 2024-12-05 ENCOUNTER — Ambulatory Visit

## 2024-12-10 ENCOUNTER — Ambulatory Visit

## 2024-12-12 ENCOUNTER — Ambulatory Visit

## 2024-12-14 ENCOUNTER — Ambulatory Visit

## 2024-12-17 ENCOUNTER — Ambulatory Visit

## 2024-12-19 ENCOUNTER — Ambulatory Visit
